# Patient Record
Sex: Female | Born: 1935 | Race: White | Hispanic: No | Smoking: Never smoker
Health system: Southern US, Community
[De-identification: ages and names within clinical notes are randomized; demographics above are authoritative.]

## PROBLEM LIST (undated history)

## (undated) DIAGNOSIS — I10 Essential (primary) hypertension: Secondary | ICD-10-CM

## (undated) DIAGNOSIS — M25552 Pain in left hip: Secondary | ICD-10-CM

## (undated) DIAGNOSIS — M1990 Unspecified osteoarthritis, unspecified site: Secondary | ICD-10-CM

## (undated) DIAGNOSIS — D649 Anemia, unspecified: Secondary | ICD-10-CM

## (undated) DIAGNOSIS — I495 Sick sinus syndrome: Secondary | ICD-10-CM

## (undated) DIAGNOSIS — I4891 Unspecified atrial fibrillation: Secondary | ICD-10-CM

## (undated) DIAGNOSIS — J449 Chronic obstructive pulmonary disease, unspecified: Secondary | ICD-10-CM

## (undated) DIAGNOSIS — I499 Cardiac arrhythmia, unspecified: Secondary | ICD-10-CM

## (undated) DIAGNOSIS — Z993 Dependence on wheelchair: Secondary | ICD-10-CM

## (undated) HISTORY — DX: Chronic obstructive pulmonary disease, unspecified: J44.9

## (undated) HISTORY — PX: BACK SURGERY: SHX140

## (undated) HISTORY — DX: Unspecified atrial fibrillation: I48.91

## (undated) HISTORY — PX: KNEE ARTHROPLASTY: SHX992

## (undated) HISTORY — DX: Unspecified osteoarthritis, unspecified site: M19.90

## (undated) HISTORY — DX: Sick sinus syndrome: I49.5

## (undated) HISTORY — PX: EYE SURGERY: SHX253

## (undated) HISTORY — DX: Essential (primary) hypertension: I10

## (undated) HISTORY — PX: OTHER SURGICAL HISTORY: SHX169

---

## 1999-05-22 ENCOUNTER — Encounter: Payer: Self-pay | Admitting: Orthopedic Surgery

## 1999-05-24 ENCOUNTER — Ambulatory Visit (HOSPITAL_COMMUNITY): Admission: RE | Admit: 1999-05-24 | Discharge: 1999-05-24 | Payer: Self-pay | Admitting: Orthopedic Surgery

## 1999-09-26 ENCOUNTER — Encounter: Payer: Self-pay | Admitting: Orthopedic Surgery

## 1999-09-28 ENCOUNTER — Inpatient Hospital Stay (HOSPITAL_COMMUNITY): Admission: RE | Admit: 1999-09-28 | Discharge: 1999-10-03 | Payer: Self-pay | Admitting: Orthopedic Surgery

## 1999-10-03 ENCOUNTER — Inpatient Hospital Stay (HOSPITAL_COMMUNITY): Admission: RE | Admit: 1999-10-03 | Discharge: 1999-10-07 | Payer: Self-pay | Admitting: *Deleted

## 2000-04-30 ENCOUNTER — Encounter: Payer: Self-pay | Admitting: Orthopedic Surgery

## 2000-04-30 ENCOUNTER — Ambulatory Visit (HOSPITAL_COMMUNITY): Admission: RE | Admit: 2000-04-30 | Discharge: 2000-04-30 | Payer: Self-pay | Admitting: Orthopedic Surgery

## 2001-02-26 ENCOUNTER — Ambulatory Visit (HOSPITAL_COMMUNITY): Admission: RE | Admit: 2001-02-26 | Discharge: 2001-02-26 | Payer: Self-pay | Admitting: Pulmonary Disease

## 2001-09-23 ENCOUNTER — Other Ambulatory Visit: Admission: RE | Admit: 2001-09-23 | Discharge: 2001-09-23 | Payer: Self-pay | Admitting: Obstetrics and Gynecology

## 2004-07-19 ENCOUNTER — Inpatient Hospital Stay (HOSPITAL_COMMUNITY): Admission: EM | Admit: 2004-07-19 | Discharge: 2004-07-20 | Payer: Self-pay | Admitting: Emergency Medicine

## 2004-07-19 ENCOUNTER — Ambulatory Visit: Payer: Self-pay | Admitting: Sports Medicine

## 2004-09-11 ENCOUNTER — Ambulatory Visit: Payer: Self-pay | Admitting: Cardiology

## 2004-09-21 ENCOUNTER — Ambulatory Visit: Payer: Self-pay | Admitting: Cardiology

## 2004-09-29 ENCOUNTER — Ambulatory Visit: Payer: Self-pay | Admitting: Cardiology

## 2004-10-06 ENCOUNTER — Ambulatory Visit: Payer: Self-pay | Admitting: Internal Medicine

## 2004-10-13 ENCOUNTER — Ambulatory Visit (HOSPITAL_COMMUNITY): Admission: RE | Admit: 2004-10-13 | Discharge: 2004-10-13 | Payer: Self-pay | Admitting: Cardiology

## 2004-10-16 ENCOUNTER — Ambulatory Visit: Payer: Self-pay | Admitting: Cardiology

## 2004-10-23 ENCOUNTER — Ambulatory Visit: Payer: Self-pay | Admitting: Cardiology

## 2004-11-02 ENCOUNTER — Ambulatory Visit: Payer: Self-pay | Admitting: Cardiology

## 2004-11-07 ENCOUNTER — Ambulatory Visit: Payer: Self-pay | Admitting: Cardiology

## 2004-11-16 ENCOUNTER — Ambulatory Visit: Payer: Self-pay | Admitting: Internal Medicine

## 2004-11-22 ENCOUNTER — Ambulatory Visit: Payer: Self-pay | Admitting: *Deleted

## 2004-11-27 ENCOUNTER — Ambulatory Visit: Payer: Self-pay | Admitting: Cardiology

## 2004-12-06 ENCOUNTER — Ambulatory Visit: Payer: Self-pay

## 2004-12-15 ENCOUNTER — Ambulatory Visit: Payer: Self-pay | Admitting: Cardiovascular Disease

## 2004-12-29 ENCOUNTER — Ambulatory Visit: Payer: Self-pay | Admitting: Internal Medicine

## 2005-01-03 ENCOUNTER — Ambulatory Visit: Payer: Self-pay | Admitting: Cardiology

## 2005-01-09 ENCOUNTER — Ambulatory Visit: Payer: Self-pay | Admitting: Cardiology

## 2005-01-09 ENCOUNTER — Ambulatory Visit: Payer: Self-pay | Admitting: Internal Medicine

## 2005-01-09 ENCOUNTER — Inpatient Hospital Stay (HOSPITAL_COMMUNITY): Admission: AD | Admit: 2005-01-09 | Discharge: 2005-01-12 | Payer: Self-pay | Admitting: Cardiology

## 2005-01-11 ENCOUNTER — Encounter: Payer: Self-pay | Admitting: Internal Medicine

## 2005-01-16 ENCOUNTER — Ambulatory Visit: Payer: Self-pay | Admitting: Cardiovascular Disease

## 2005-01-16 ENCOUNTER — Inpatient Hospital Stay (HOSPITAL_COMMUNITY): Admission: AD | Admit: 2005-01-16 | Discharge: 2005-01-21 | Payer: Self-pay | Admitting: Cardiology

## 2005-01-25 ENCOUNTER — Ambulatory Visit: Payer: Self-pay | Admitting: Cardiology

## 2005-01-29 ENCOUNTER — Ambulatory Visit (HOSPITAL_COMMUNITY): Admission: RE | Admit: 2005-01-29 | Discharge: 2005-01-29 | Payer: Self-pay | Admitting: Cardiology

## 2005-02-02 ENCOUNTER — Ambulatory Visit: Payer: Self-pay | Admitting: Cardiology

## 2005-02-14 ENCOUNTER — Ambulatory Visit: Payer: Self-pay | Admitting: Cardiology

## 2005-02-28 ENCOUNTER — Ambulatory Visit: Payer: Self-pay | Admitting: Internal Medicine

## 2005-03-17 ENCOUNTER — Emergency Department (HOSPITAL_COMMUNITY): Admission: EM | Admit: 2005-03-17 | Discharge: 2005-03-17 | Payer: Self-pay | Admitting: Emergency Medicine

## 2005-03-21 ENCOUNTER — Ambulatory Visit: Payer: Self-pay | Admitting: Cardiology

## 2005-03-27 ENCOUNTER — Encounter: Admission: RE | Admit: 2005-03-27 | Discharge: 2005-03-27 | Payer: Self-pay | Admitting: Orthopedic Surgery

## 2005-04-18 ENCOUNTER — Ambulatory Visit: Payer: Self-pay | Admitting: Cardiology

## 2005-04-25 ENCOUNTER — Ambulatory Visit: Payer: Self-pay | Admitting: *Deleted

## 2005-04-26 ENCOUNTER — Encounter: Admission: RE | Admit: 2005-04-26 | Discharge: 2005-04-26 | Payer: Self-pay | Admitting: Orthopedic Surgery

## 2005-05-01 ENCOUNTER — Ambulatory Visit: Payer: Self-pay | Admitting: Cardiology

## 2005-05-08 ENCOUNTER — Ambulatory Visit: Payer: Self-pay | Admitting: Cardiology

## 2005-05-21 ENCOUNTER — Ambulatory Visit: Payer: Self-pay | Admitting: Cardiology

## 2005-05-31 ENCOUNTER — Ambulatory Visit: Payer: Self-pay | Admitting: Physical Medicine & Rehabilitation

## 2005-05-31 ENCOUNTER — Inpatient Hospital Stay (HOSPITAL_COMMUNITY): Admission: RE | Admit: 2005-05-31 | Discharge: 2005-06-06 | Payer: Self-pay | Admitting: Orthopedic Surgery

## 2005-06-26 ENCOUNTER — Ambulatory Visit: Payer: Self-pay | Admitting: Cardiology

## 2005-07-02 ENCOUNTER — Ambulatory Visit: Payer: Self-pay | Admitting: Cardiology

## 2005-07-10 ENCOUNTER — Ambulatory Visit: Payer: Self-pay | Admitting: Cardiovascular Disease

## 2005-07-20 ENCOUNTER — Ambulatory Visit: Payer: Self-pay | Admitting: Cardiology

## 2005-08-03 ENCOUNTER — Ambulatory Visit: Payer: Self-pay | Admitting: Cardiology

## 2005-08-31 ENCOUNTER — Ambulatory Visit: Payer: Self-pay | Admitting: Cardiovascular Disease

## 2005-09-14 ENCOUNTER — Ambulatory Visit: Payer: Self-pay | Admitting: Cardiology

## 2005-09-27 ENCOUNTER — Ambulatory Visit: Payer: Self-pay | Admitting: Cardiology

## 2005-10-01 ENCOUNTER — Ambulatory Visit: Payer: Self-pay | Admitting: Cardiology

## 2005-10-05 ENCOUNTER — Ambulatory Visit: Payer: Self-pay | Admitting: Internal Medicine

## 2005-10-09 ENCOUNTER — Ambulatory Visit (HOSPITAL_COMMUNITY): Admission: RE | Admit: 2005-10-09 | Discharge: 2005-10-09 | Payer: Self-pay | Admitting: Pulmonary Disease

## 2005-10-15 ENCOUNTER — Ambulatory Visit: Payer: Self-pay | Admitting: Cardiology

## 2005-10-17 ENCOUNTER — Observation Stay (HOSPITAL_COMMUNITY): Admission: RE | Admit: 2005-10-17 | Discharge: 2005-10-20 | Payer: Self-pay | Admitting: General Surgery

## 2005-10-19 ENCOUNTER — Encounter (INDEPENDENT_AMBULATORY_CARE_PROVIDER_SITE_OTHER): Payer: Self-pay | Admitting: General Surgery

## 2005-10-26 ENCOUNTER — Ambulatory Visit: Payer: Self-pay | Admitting: Cardiology

## 2005-11-09 ENCOUNTER — Ambulatory Visit: Payer: Self-pay | Admitting: Cardiology

## 2005-11-23 ENCOUNTER — Ambulatory Visit: Payer: Self-pay | Admitting: Internal Medicine

## 2005-12-11 ENCOUNTER — Ambulatory Visit (HOSPITAL_COMMUNITY): Admission: RE | Admit: 2005-12-11 | Discharge: 2005-12-11 | Payer: Self-pay | Admitting: Pulmonary Disease

## 2005-12-20 ENCOUNTER — Ambulatory Visit: Payer: Self-pay | Admitting: *Deleted

## 2005-12-27 ENCOUNTER — Ambulatory Visit: Payer: Self-pay | Admitting: Cardiology

## 2005-12-28 ENCOUNTER — Encounter (HOSPITAL_COMMUNITY): Admission: RE | Admit: 2005-12-28 | Discharge: 2006-01-27 | Payer: Self-pay | Admitting: Pulmonary Disease

## 2005-12-29 ENCOUNTER — Emergency Department (HOSPITAL_COMMUNITY): Admission: EM | Admit: 2005-12-29 | Discharge: 2005-12-29 | Payer: Self-pay | Admitting: Emergency Medicine

## 2006-01-10 ENCOUNTER — Ambulatory Visit: Payer: Self-pay | Admitting: Cardiology

## 2006-01-14 ENCOUNTER — Ambulatory Visit: Payer: Self-pay | Admitting: Cardiology

## 2006-01-14 ENCOUNTER — Ambulatory Visit: Payer: Self-pay | Admitting: Physical Medicine & Rehabilitation

## 2006-01-14 ENCOUNTER — Inpatient Hospital Stay (HOSPITAL_COMMUNITY): Admission: EM | Admit: 2006-01-14 | Discharge: 2006-01-25 | Payer: Self-pay | Admitting: Gastroenterology

## 2006-01-14 ENCOUNTER — Ambulatory Visit: Payer: Self-pay | Admitting: Gastroenterology

## 2006-01-24 ENCOUNTER — Ambulatory Visit: Payer: Self-pay | Admitting: Gastroenterology

## 2006-02-13 ENCOUNTER — Ambulatory Visit: Payer: Self-pay | Admitting: Cardiology

## 2006-02-26 ENCOUNTER — Ambulatory Visit: Payer: Self-pay | Admitting: Gastroenterology

## 2006-03-19 ENCOUNTER — Ambulatory Visit: Payer: Self-pay | Admitting: Cardiology

## 2006-03-28 ENCOUNTER — Ambulatory Visit: Payer: Self-pay | Admitting: Cardiology

## 2006-03-29 ENCOUNTER — Ambulatory Visit: Payer: Self-pay | Admitting: Internal Medicine

## 2006-03-29 ENCOUNTER — Ambulatory Visit (HOSPITAL_COMMUNITY): Admission: RE | Admit: 2006-03-29 | Discharge: 2006-03-30 | Payer: Self-pay | Admitting: Internal Medicine

## 2006-04-03 ENCOUNTER — Ambulatory Visit: Payer: Self-pay | Admitting: Cardiology

## 2006-04-12 ENCOUNTER — Ambulatory Visit: Payer: Self-pay | Admitting: Cardiology

## 2006-04-17 ENCOUNTER — Ambulatory Visit: Payer: Self-pay

## 2006-04-29 ENCOUNTER — Ambulatory Visit: Payer: Self-pay | Admitting: Cardiology

## 2006-04-30 ENCOUNTER — Ambulatory Visit: Payer: Self-pay | Admitting: Cardiology

## 2006-05-20 ENCOUNTER — Ambulatory Visit: Payer: Self-pay | Admitting: Cardiology

## 2006-05-20 ENCOUNTER — Ambulatory Visit: Payer: Self-pay | Admitting: Internal Medicine

## 2006-06-10 ENCOUNTER — Ambulatory Visit: Payer: Self-pay | Admitting: Cardiovascular Disease

## 2006-06-21 ENCOUNTER — Ambulatory Visit: Payer: Self-pay | Admitting: Cardiology

## 2006-06-25 ENCOUNTER — Ambulatory Visit: Payer: Self-pay | Admitting: Internal Medicine

## 2006-06-25 ENCOUNTER — Ambulatory Visit: Payer: Self-pay | Admitting: Cardiology

## 2006-06-28 ENCOUNTER — Ambulatory Visit: Payer: Self-pay | Admitting: Internal Medicine

## 2006-07-09 ENCOUNTER — Ambulatory Visit: Payer: Self-pay | Admitting: *Deleted

## 2006-07-23 ENCOUNTER — Ambulatory Visit: Payer: Self-pay | Admitting: Cardiovascular Disease

## 2006-08-13 ENCOUNTER — Ambulatory Visit: Payer: Self-pay | Admitting: *Deleted

## 2006-09-03 ENCOUNTER — Ambulatory Visit: Payer: Self-pay | Admitting: Internal Medicine

## 2006-09-17 ENCOUNTER — Ambulatory Visit: Payer: Self-pay | Admitting: *Deleted

## 2006-09-17 ENCOUNTER — Ambulatory Visit: Payer: Self-pay | Admitting: Cardiology

## 2006-10-07 ENCOUNTER — Ambulatory Visit: Payer: Self-pay | Admitting: Cardiology

## 2006-10-15 ENCOUNTER — Ambulatory Visit: Payer: Self-pay | Admitting: Cardiology

## 2006-10-29 ENCOUNTER — Ambulatory Visit: Payer: Self-pay | Admitting: Cardiology

## 2006-11-26 ENCOUNTER — Ambulatory Visit: Payer: Self-pay | Admitting: Cardiology

## 2006-12-24 ENCOUNTER — Ambulatory Visit: Payer: Self-pay | Admitting: Cardiology

## 2006-12-30 ENCOUNTER — Ambulatory Visit: Payer: Self-pay | Admitting: Internal Medicine

## 2007-01-06 ENCOUNTER — Ambulatory Visit: Payer: Self-pay | Admitting: Cardiology

## 2007-01-27 ENCOUNTER — Ambulatory Visit: Payer: Self-pay | Admitting: Internal Medicine

## 2007-02-24 ENCOUNTER — Ambulatory Visit: Payer: Self-pay | Admitting: Cardiology

## 2007-03-05 ENCOUNTER — Encounter (INDEPENDENT_AMBULATORY_CARE_PROVIDER_SITE_OTHER): Payer: Self-pay

## 2007-03-05 LAB — CONVERTED CEMR LAB
Albumin: 3.5 g/dL
Alkaline Phosphatase: 92 units/L
CO2: 21 meq/L
Calcium: 8.1 mg/dL
Chloride: 108 meq/L
Creatinine, Ser: 0.68 mg/dL
HDL: 50 mg/dL
LDL Cholesterol: 90 mg/dL
MCV: 100.8 fL
Potassium: 3.7 meq/L
Sodium: 143 meq/L
Total Protein: 6.6 g/dL

## 2007-03-24 ENCOUNTER — Ambulatory Visit: Payer: Self-pay | Admitting: Internal Medicine

## 2007-04-08 ENCOUNTER — Ambulatory Visit: Payer: Self-pay | Admitting: Cardiology

## 2007-04-22 ENCOUNTER — Ambulatory Visit: Payer: Self-pay | Admitting: Cardiology

## 2007-05-08 ENCOUNTER — Ambulatory Visit: Payer: Self-pay | Admitting: Cardiology

## 2007-05-22 ENCOUNTER — Ambulatory Visit: Payer: Self-pay | Admitting: Cardiovascular Disease

## 2007-05-29 ENCOUNTER — Ambulatory Visit: Payer: Self-pay | Admitting: Cardiology

## 2007-06-26 ENCOUNTER — Ambulatory Visit: Payer: Self-pay | Admitting: Cardiovascular Disease

## 2007-07-25 ENCOUNTER — Ambulatory Visit: Payer: Self-pay | Admitting: Cardiology

## 2007-10-01 ENCOUNTER — Ambulatory Visit: Payer: Self-pay | Admitting: Cardiology

## 2007-10-16 ENCOUNTER — Ambulatory Visit: Payer: Self-pay | Admitting: Cardiovascular Disease

## 2007-10-31 ENCOUNTER — Ambulatory Visit: Payer: Self-pay | Admitting: Cardiology

## 2007-11-19 ENCOUNTER — Ambulatory Visit: Payer: Self-pay | Admitting: Cardiovascular Disease

## 2007-12-12 ENCOUNTER — Ambulatory Visit: Payer: Self-pay | Admitting: Cardiology

## 2008-01-02 ENCOUNTER — Ambulatory Visit: Payer: Self-pay | Admitting: Cardiology

## 2008-01-29 ENCOUNTER — Ambulatory Visit: Payer: Self-pay | Admitting: Cardiology

## 2008-02-26 ENCOUNTER — Ambulatory Visit: Payer: Self-pay | Admitting: Cardiology

## 2008-03-25 ENCOUNTER — Ambulatory Visit: Payer: Self-pay | Admitting: Internal Medicine

## 2008-04-13 ENCOUNTER — Ambulatory Visit: Payer: Self-pay | Admitting: Internal Medicine

## 2008-04-19 ENCOUNTER — Ambulatory Visit: Payer: Self-pay | Admitting: Cardiology

## 2008-05-03 ENCOUNTER — Ambulatory Visit: Payer: Self-pay | Admitting: Internal Medicine

## 2008-05-17 ENCOUNTER — Ambulatory Visit: Payer: Self-pay | Admitting: Internal Medicine

## 2008-05-31 ENCOUNTER — Ambulatory Visit: Payer: Self-pay | Admitting: Cardiovascular Disease

## 2008-06-21 ENCOUNTER — Ambulatory Visit: Payer: Self-pay | Admitting: Cardiology

## 2008-07-12 ENCOUNTER — Ambulatory Visit: Payer: Self-pay | Admitting: Cardiology

## 2008-07-16 ENCOUNTER — Ambulatory Visit: Payer: Self-pay | Admitting: Cardiology

## 2008-08-09 ENCOUNTER — Ambulatory Visit: Payer: Self-pay | Admitting: Cardiovascular Disease

## 2008-08-30 ENCOUNTER — Ambulatory Visit: Payer: Self-pay | Admitting: Internal Medicine

## 2008-09-16 ENCOUNTER — Ambulatory Visit: Payer: Self-pay | Admitting: Cardiology

## 2008-09-30 ENCOUNTER — Ambulatory Visit: Payer: Self-pay | Admitting: Cardiology

## 2008-10-28 ENCOUNTER — Ambulatory Visit: Payer: Self-pay | Admitting: Cardiology

## 2008-11-29 ENCOUNTER — Ambulatory Visit: Payer: Self-pay | Admitting: Cardiology

## 2008-12-27 ENCOUNTER — Ambulatory Visit: Payer: Self-pay | Admitting: Internal Medicine

## 2009-01-24 ENCOUNTER — Ambulatory Visit: Payer: Self-pay | Admitting: Cardiology

## 2009-02-07 ENCOUNTER — Ambulatory Visit: Payer: Self-pay | Admitting: Cardiology

## 2009-02-18 ENCOUNTER — Encounter (INDEPENDENT_AMBULATORY_CARE_PROVIDER_SITE_OTHER): Payer: Self-pay | Admitting: Radiology

## 2009-03-07 ENCOUNTER — Ambulatory Visit: Payer: Self-pay | Admitting: Cardiology

## 2009-03-22 ENCOUNTER — Ambulatory Visit (HOSPITAL_COMMUNITY): Admission: RE | Admit: 2009-03-22 | Discharge: 2009-03-22 | Payer: Self-pay | Admitting: Ophthalmology

## 2009-03-28 ENCOUNTER — Ambulatory Visit: Payer: Self-pay | Admitting: Cardiology

## 2009-04-12 ENCOUNTER — Encounter: Payer: Self-pay | Admitting: *Deleted

## 2009-04-12 DIAGNOSIS — M129 Arthropathy, unspecified: Secondary | ICD-10-CM | POA: Insufficient documentation

## 2009-04-12 DIAGNOSIS — I4891 Unspecified atrial fibrillation: Secondary | ICD-10-CM | POA: Insufficient documentation

## 2009-04-12 DIAGNOSIS — J449 Chronic obstructive pulmonary disease, unspecified: Secondary | ICD-10-CM | POA: Insufficient documentation

## 2009-04-12 DIAGNOSIS — I10 Essential (primary) hypertension: Secondary | ICD-10-CM

## 2009-04-13 ENCOUNTER — Ambulatory Visit: Payer: Self-pay | Admitting: Internal Medicine

## 2009-04-13 ENCOUNTER — Encounter: Payer: Self-pay | Admitting: Internal Medicine

## 2009-04-13 DIAGNOSIS — Z950 Presence of cardiac pacemaker: Secondary | ICD-10-CM | POA: Insufficient documentation

## 2009-04-29 ENCOUNTER — Encounter: Payer: Self-pay | Admitting: Cardiology

## 2009-05-05 ENCOUNTER — Ambulatory Visit: Payer: Self-pay | Admitting: Cardiology

## 2009-05-18 ENCOUNTER — Encounter: Payer: Self-pay | Admitting: *Deleted

## 2009-06-06 ENCOUNTER — Ambulatory Visit: Payer: Self-pay | Admitting: Cardiology

## 2009-06-27 ENCOUNTER — Encounter: Payer: Self-pay | Admitting: *Deleted

## 2009-07-11 ENCOUNTER — Ambulatory Visit: Payer: Self-pay | Admitting: Cardiology

## 2009-07-11 LAB — CONVERTED CEMR LAB: POC INR: 3

## 2009-08-08 ENCOUNTER — Ambulatory Visit: Payer: Self-pay | Admitting: Cardiology

## 2009-08-29 ENCOUNTER — Ambulatory Visit: Payer: Self-pay | Admitting: Cardiology

## 2009-08-29 LAB — CONVERTED CEMR LAB: POC INR: 2.8

## 2009-09-22 ENCOUNTER — Ambulatory Visit: Payer: Self-pay | Admitting: Cardiology

## 2009-09-22 LAB — CONVERTED CEMR LAB: POC INR: 2.6

## 2009-10-10 ENCOUNTER — Ambulatory Visit: Admission: RE | Admit: 2009-10-10 | Discharge: 2009-10-10 | Payer: Self-pay | Admitting: Pulmonary Disease

## 2009-10-20 ENCOUNTER — Ambulatory Visit: Payer: Self-pay | Admitting: Cardiology

## 2009-10-31 ENCOUNTER — Ambulatory Visit: Payer: Self-pay | Admitting: Cardiology

## 2009-10-31 ENCOUNTER — Encounter: Payer: Self-pay | Admitting: Internal Medicine

## 2009-11-16 ENCOUNTER — Encounter (INDEPENDENT_AMBULATORY_CARE_PROVIDER_SITE_OTHER): Payer: Self-pay

## 2009-11-16 ENCOUNTER — Ambulatory Visit: Payer: Self-pay | Admitting: Cardiology

## 2009-11-16 LAB — CONVERTED CEMR LAB: POC INR: 2.8

## 2009-12-15 ENCOUNTER — Ambulatory Visit: Payer: Self-pay | Admitting: Cardiology

## 2009-12-15 LAB — CONVERTED CEMR LAB: POC INR: 2.7

## 2010-01-12 ENCOUNTER — Ambulatory Visit: Payer: Self-pay | Admitting: Cardiology

## 2010-01-12 LAB — CONVERTED CEMR LAB: POC INR: 2.8

## 2010-02-09 ENCOUNTER — Ambulatory Visit: Payer: Self-pay | Admitting: Cardiology

## 2010-03-13 ENCOUNTER — Ambulatory Visit: Payer: Self-pay | Admitting: Cardiology

## 2010-03-27 ENCOUNTER — Ambulatory Visit: Payer: Self-pay | Admitting: Cardiology

## 2010-03-31 ENCOUNTER — Ambulatory Visit: Admission: RE | Admit: 2010-03-31 | Discharge: 2010-03-31 | Payer: Self-pay | Admitting: Gynecology

## 2010-04-17 ENCOUNTER — Encounter: Payer: Self-pay | Admitting: Cardiology

## 2010-04-17 ENCOUNTER — Telehealth (INDEPENDENT_AMBULATORY_CARE_PROVIDER_SITE_OTHER): Payer: Self-pay

## 2010-04-18 ENCOUNTER — Encounter: Payer: Self-pay | Admitting: Cardiology

## 2010-04-19 ENCOUNTER — Ambulatory Visit: Payer: Self-pay | Admitting: Cardiology

## 2010-04-19 ENCOUNTER — Telehealth: Payer: Self-pay | Admitting: Cardiology

## 2010-04-19 LAB — CONVERTED CEMR LAB: POC INR: 2.5

## 2010-04-20 ENCOUNTER — Encounter (INDEPENDENT_AMBULATORY_CARE_PROVIDER_SITE_OTHER): Payer: Self-pay | Admitting: *Deleted

## 2010-04-25 ENCOUNTER — Ambulatory Visit (HOSPITAL_COMMUNITY): Admission: RE | Admit: 2010-04-25 | Discharge: 2010-04-27 | Payer: Self-pay | Admitting: Gynecologic Oncology

## 2010-04-25 ENCOUNTER — Encounter: Payer: Self-pay | Admitting: Obstetrics and Gynecology

## 2010-05-01 ENCOUNTER — Ambulatory Visit: Payer: Self-pay | Admitting: Cardiovascular Disease

## 2010-05-10 ENCOUNTER — Ambulatory Visit: Payer: Self-pay | Admitting: Cardiology

## 2010-05-10 ENCOUNTER — Ambulatory Visit: Payer: Self-pay | Admitting: Internal Medicine

## 2010-05-10 LAB — CONVERTED CEMR LAB: POC INR: 2.4

## 2010-05-31 ENCOUNTER — Ambulatory Visit: Payer: Self-pay | Admitting: Cardiology

## 2010-05-31 LAB — CONVERTED CEMR LAB: POC INR: 2.5

## 2010-07-03 ENCOUNTER — Ambulatory Visit: Payer: Self-pay | Admitting: Cardiology

## 2010-07-03 LAB — CONVERTED CEMR LAB: POC INR: 3

## 2010-07-31 ENCOUNTER — Ambulatory Visit: Payer: Self-pay | Admitting: Cardiology

## 2010-08-28 ENCOUNTER — Ambulatory Visit: Payer: Self-pay | Admitting: Cardiology

## 2010-08-28 LAB — CONVERTED CEMR LAB: POC INR: 3.2

## 2010-10-02 ENCOUNTER — Ambulatory Visit: Payer: Self-pay | Admitting: Cardiology

## 2010-10-02 LAB — CONVERTED CEMR LAB: POC INR: 2.2

## 2010-10-27 ENCOUNTER — Encounter: Payer: Self-pay | Admitting: Internal Medicine

## 2010-10-27 ENCOUNTER — Ambulatory Visit: Payer: Self-pay | Admitting: Cardiology

## 2010-10-27 ENCOUNTER — Ambulatory Visit: Payer: Self-pay

## 2010-11-27 ENCOUNTER — Ambulatory Visit
Admission: RE | Admit: 2010-11-27 | Discharge: 2010-11-27 | Payer: Self-pay | Source: Home / Self Care | Attending: Cardiology | Admitting: Cardiology

## 2010-12-03 ENCOUNTER — Encounter: Payer: Self-pay | Admitting: Pulmonary Disease

## 2010-12-14 NOTE — Medication Information (Signed)
Summary: ccr-lr  Anticoagulant Therapy  Managed by: Vashti Hey, RN Referring MD: Valera Castle MD PCP: Dr.Hawkins Supervising MD: Daleen Squibb MD, Maisie Fus Indication 1: Atrial Fibrillation (ICD-427.31) Lab Used: [facility] [place] Site: [place] INR POC 2.5 INR RANGE 2 - 3  Dietary changes: no    Health status changes: no    Bleeding/hemorrhagic complications: no    Recent/future hospitalizations: no    Any changes in medication regimen? no    Recent/future dental: no  Any missed doses?: no       Is patient compliant with meds? yes       Allergies: No Known Drug Allergies  Anticoagulation Management History:      Positive risk factors for bleeding include an age of 75 years or older.  The bleeding index is 'intermediate risk'.  Positive CHADS2 values include History of HTN and Age > 50 years old.  The start date was 05/05/2009.  Anticoagulation responsible provider: Daleen Squibb MD, Maisie Fus.  INR POC: 2.5.  Exp: 06/2011.    Anticoagulation Management Assessment/Plan:      The patient's current anticoagulation dose is Warfarin sodium 5 mg tabs: Take as directed by [facility]in Clinic.  The target INR is 2 - 3.  The next INR is due 07/03/2010.  Anticoagulation instructions were given to patient.  Results were reviewed/authorized by Vashti Hey, RN.  She was notified by Vashti Hey RN.         Prior Anticoagulation Instructions: INR 2.4 Continue coumadin 5mg  once daily except 2.5mg  on Mondays, Wednesdays and Fridays  Current Anticoagulation Instructions: INR 2.5 Continue coumadin 5mg  once daily except 2.5mg  on Mondays, Wednesdays and Fridays

## 2010-12-14 NOTE — Cardiovascular Report (Signed)
Summary: Office Visit   Office Visit   Imported By: Roderic Ovens 11/24/2009 16:10:53  _____________________________________________________________________  External Attachment:    Type:   Image     Comment:   External Document

## 2010-12-14 NOTE — Medication Information (Signed)
Summary: CCR  Anticoagulant Therapy  Managed by: Weston Brass, PharmD Referring MD: Valera Castle MD PCP: Dr.Hawkins Supervising MD: Eden Emms MD, Theron Arista Indication 1: Atrial Fibrillation (ICD-427.31) Lab Used: [facility] [place] Site: [place] INR POC 1.5 INR RANGE 2 - 3  Dietary changes: no    Health status changes: yes       Details: having some SOB.  Slight edema in LLE>RLE  Bleeding/hemorrhagic complications: no    Recent/future hospitalizations: yes       Details: had surgery last week  Any changes in medication regimen? no    Recent/future dental: no  Any missed doses?: yes     Details: off Coumadin x 5 days for surgery.  Restarted on 6/15.  Took 5mg  x 1 day then back to normal dose.   Is patient compliant with meds? yes       Allergies: No Known Drug Allergies  Anticoagulation Management History:      The patient is taking warfarin and comes in today for a routine follow up visit.  Positive risk factors for bleeding include an age of 7 years or older.  The bleeding index is 'intermediate risk'.  Positive CHADS2 values include History of HTN and Age > 50 years old.  The start date was 05/05/2009.  Anticoagulation responsible provider: Eden Emms MD, Theron Arista.  INR POC: 1.5.  Cuvette Lot#: [id number].  Exp: 06/2011.    Anticoagulation Management Assessment/Plan:      The patient's current anticoagulation dose is Warfarin sodium 5 mg tabs: Take as directed by Coumadin Clinic.  The target INR is 2 - 3.  The next INR is due 05/10/2010.  Anticoagulation instructions were given to patient.  Results were reviewed/authorized by Weston Brass, PharmD.  She was notified by Weston Brass PharmD.         Prior Anticoagulation Instructions: INR 2.5 Continue coumadin 5mg  once daily except 2.5mg  on Mondays, Wednesdays and Fridays Scheduled for hysterectomy on 04/25/10.  Last dose of coumadin on 04/21/10.  Restart coumadin at above dose ASAP after surgery.  Current  Anticoagulation Instructions: INR 1.5  Take 1 1/2 tablets today then resume same dose of 1 tablet every day except 1/2 tablet on Monday, Wednesday and Friday.

## 2010-12-14 NOTE — Progress Notes (Signed)
Summary: NEEDS TO BE OFF COUMADIN FOR 5 DAYS INSTEAD OF THREE   Phone Note From Other Clinic Call back at [phone] FAX [phone]   Caller: Karie Fetch RN WITH [facility] Request: Talk with Provider Summary of Call: S: PT WAS GIVEN CLEARANCE TO STOP COUMADIN FOR 3 DAYS FOR D&C BUT IS HAVING TAH, BSO AND POSSIBLE LIPNODE BIOPSY SO SHE WILL NEED TO BE OFF FOR 5 DAYS. WHEN DONE SHE WILL BE GIVEN A SHOT OF LOVANOX BEFORE PROCEDURE.  NEEDS TO KNOW BY 04/20/10 Initial call taken by: Faythe Ghee,  April 19, 2010 4:50 PM  Follow-up for Phone Call        Please advise. Follow-up by: Larita Fife Via LPN,  April 19, 6294 4:54 PM  Additional Follow-up for Phone Call Additional follow up Details #1::        OK to stop Coumadin for 5 days.  Additional Follow-up by: Gaylord Shih, MD, [facility],  April 20, 2010 3:34 PM     Appended Document: NEEDS TO BE OFF COUMADIN FOR 5 DAYS INSTEAD OF THREE clearance letter faxed to Dr. Cleda Mccreedy MD

## 2010-12-14 NOTE — Medication Information (Signed)
Summary: ccr-lr  Anticoagulant Therapy  Managed by: Vashti Hey, RN Referring MD: Valera Castle MD PCP: Dr.Hawkins Supervising MD: Dietrich Pates MD, Molly Maduro Indication 1: Atrial Fibrillation (ICD-427.31) Lab Used: [facility] [place] Site: [place] INR POC 2.2 INR RANGE 2 - 3  Dietary changes: no    Health status changes: no    Bleeding/hemorrhagic complications: no    Recent/future hospitalizations: no    Any changes in medication regimen? no    Recent/future dental: no  Any missed doses?: no       Is patient compliant with meds? yes       Allergies: No Known Drug Allergies  Anticoagulation Management History:      The patient is taking warfarin and comes in today for a routine follow up visit.  Positive risk factors for bleeding include an age of 43 years or older.  The bleeding index is 'intermediate risk'.  Positive CHADS2 values include History of HTN and Age > 82 years old.  The start date was 05/05/2009.  Anticoagulation responsible provider: Dietrich Pates MD, Molly Maduro.  INR POC: 2.2.  Cuvette Lot#: [id number].  Exp: 06/2011.    Anticoagulation Management Assessment/Plan:      The patient's current anticoagulation dose is Warfarin sodium 5 mg tabs: Take as directed by Coumadin Clinic.  The target INR is 2 - 3.  The next INR is due 10/30/2010.  Anticoagulation instructions were given to patient.  Results were reviewed/authorized by Vashti Hey, RN.  She was notified by Vashti Hey RN.         Prior Anticoagulation Instructions: INR 3.2 Hold coumadin tonight then resume 5mg  once daily except 2.5mg  on Mondays, Wednesdays and Fridays  Current Anticoagulation Instructions: INR 2.2 Continue coumadin 5mg  once daily except 2.5mg  on Mondays, Wednesdays and Fridays

## 2010-12-14 NOTE — Assessment & Plan Note (Signed)
Summary: 6 mth f/u per checkout in 1/11/tg  Medications Added ALLEGRA 180 MG TABS (FEXOFENADINE HCL) take one tablet once a day THERAGRAN-M  TABS (MULTIPLE VITAMINS-MINERALS) take one tablet by mouth once daily      Allergies Added: NKDA  Visit Type:  Follow-up Primary Provider:  Dr.Hawkins  CC:  follow-up visit.  History of Present Illness: [name] returns today for evaluation and management of her chronic atrial fibrillation, anticoagulation, history of bradycardia requiring pacemaker implantation, and hypertension.  She just had her having a hysterectomy for a cancerous polyp. She tolerated the procedure remarkably well with no cardiac complications. She also was able to come off Coumadin without problems.  She still off of a walker. She denies any orthopnea, PND peripheral edema. She's had no chest pain or and no recurrent falls.  Preventive Screening-Counseling & Management  Alcohol-Tobacco     Smoking Status: never  Current Medications (verified): 1)  Warfarin Sodium 5 Mg Tabs (Warfarin Sodium) .... Take As Directed By Coumadin Clinic 2)  Lisinopril 40 Mg Tabs (Lisinopril) .Marland Kitchen.. 1 Tab Once Daily 3)  Metoprolol Tartrate 25 Mg Tabs (Metoprolol Tartrate) .... One By Mouth Two Times A Day 4)  Allegra 180 Mg Tabs (Fexofenadine Hcl) .... Take One Tablet Once A Day 5)  Theragran-M  Tabs (Multiple Vitamins-Minerals) .... Take One Tablet By Mouth Once Daily  Allergies (verified): No Known Drug Allergies  Past History:  Past Medical History: Last updated: 2009-04-19  Current Problems:  COPD (ICD-496) ARTHRITIS (ICD-716.90) HYPERTENSION (ICD-401.9) ATRIAL FIBRILLATION (ICD-427.31)  Past Surgical History: Last updated: 2009/04/19 Back Surgery Hip Arthroplasty-Total Knee Arthroscopy  Family History: Last updated: 04/19/2009 Father: deceased -prostate ca,chf Mother: deceased-cva Siblings: 1 sister deceased-unknown  Social History: Last updated:  04-19-2009 Retired  Tobacco Use - No.   Risk Factors: Smoking Status: never (05/31/2010)  Review of Systems       negative other than history of present illness  Vital Signs:  Patient profile:   77 year old female Height:      60 inches Weight:      193.12 pounds O2 Sat:      94 % Pulse rate:   70 / minute BP sitting:   117 / 78  (left arm)  Vitals Entered By: Youlanda Mighty RN (May 31, 2010 1:24 PM) CC: follow-up visit   Physical Exam  General:  obese.  no acute distress, using walker Head:  normocephalic and atraumatic Eyes:  glasses otherwise unremarkable Neck:  Neck supple, no JVD. No masses, thyromegaly or abnormal cervical nodes. Lungs:  Clear bilaterally to auscultation and percussion. Heart:  PMI nondisplaced, irregular rate and rhythm, soft systolic murmur at the apex. Msk:  decreased ROM.   Pulses:  pulses normal in all 4 extremities Extremities:  trace left pedal edema and trace right pedal edema.   Neurologic:  Alert and oriented x 3. Skin:  Intact without lesions or rashes. Psych:  Normal affect.   PPM Specifications Following MD:  Lewayne Bunting, MD     PPM Vendor:  [employer]     PPM Model Number:  [id number]     PPM Serial Number:  [id number] PPM DOI:  03/29/2006     PPM Implanting MD:  Sherryl Manges, MD  Lead 1    Location: [facility]     DOI: 03/29/2006     Model #: 1642T     Serial #: [id number]     Status: active Lead 2    Location: [facility]  DOI: 03/29/2006     Model #: 1646T     Serial #: [id number]     Status: active  Magnet Response Rate:  BOL 98.6 ERI 86.3  Indications:  SND   Episodes Coumadin:  Yes  Parameters Mode:  DDI     Lower Rate Limit:  60     Paced AV Delay:  250     Impression & Recommendations:  Problem # 1:  ATRIAL FIBRILLATION (ICD-427.31) Assessment Unchanged  Her updated medication list for this problem includes:    Warfarin Sodium 5 Mg Tabs (Warfarin sodium) .Marland Kitchen... Take as directed by coumadin clinic    Metoprolol Tartrate 25 Mg Tabs  (Metoprolol tartrate) ..... One by mouth two times a day  Problem # 2:  HYPERTENSION (ICD-401.9) Assessment: Improved  Her updated medication list for this problem includes:    Lisinopril 40 Mg Tabs (Lisinopril) .Marland Kitchen... 1 tab once daily    Metoprolol Tartrate 25 Mg Tabs (Metoprolol tartrate) ..... One by mouth two times a day  Problem # 3:  COUMADIN THERAPY (ICD-V58.61) Assessment: Unchanged  Patient Instructions: 1)  Your physician recommends that you schedule a follow-up appointment in: 6 months 2)  Your physician recommends that you continue on your current medications as directed. Please refer to the Current Medication list given to you today.

## 2010-12-14 NOTE — Letter (Signed)
Summary: Clearance Letter  [facility]  [address] [place] [address]   Phone: [phone]  Fax: [phone]    April 20, 2010  Re:     [name] Address:   [address]     [place], [place]  [address] DOB:     [birth year] MRN:     [id number]   Dear Dr. Cleda Mccreedy:  [name] has been cleared for an total abdominal hysterectomy with bilateral salpingoopherectomy from a cardiac stand point.  She can hold her coumadin 5 day prior to surgery and restart once she is taking pills by mouth.  Please have her call the office for a inr check 7-10 days after leaving the hospital.  If we can assist in any way during her hospitalization, please let our staff know.           Sincerely,  Dr. Valera Castle, MD, [facility]

## 2010-12-14 NOTE — Medication Information (Signed)
Summary: ccr-lr  Anticoagulant Therapy  Managed by: Vashti Hey, RN Referring MD: Valera Castle MD PCP: Dr.Hawkins Supervising MD: Diona Browner MD, Remi Deter Indication 1: Atrial Fibrillation (ICD-427.31) Lab Used: [facility] [place] Site: [place] INR POC 3.2 INR RANGE 2 - 3  Dietary changes: no    Health status changes: no    Bleeding/hemorrhagic complications: no    Recent/future hospitalizations: no    Any changes in medication regimen? no    Recent/future dental: no  Any missed doses?: no       Is patient compliant with meds? yes       Allergies: No Known Drug Allergies  Anticoagulation Management History:      The patient is taking warfarin and comes in today for a routine follow up visit.  Positive risk factors for bleeding include an age of 75 years or older.  The bleeding index is 'intermediate risk'.  Positive CHADS2 values include History of HTN and Age > 42 years old.  The start date was 05/05/2009.  Anticoagulation responsible Edell Mesenbrink: Diona Browner MD, Remi Deter.  INR POC: 3.2.  Cuvette Lot#: [id number].  Exp: 06/2011.    Anticoagulation Management Assessment/Plan:      The patient's current anticoagulation dose is Warfarin sodium 5 mg tabs: Take as directed by Coumadin Clinic.  The target INR is 2 - 3.  The next INR is due 10/02/2010.  Anticoagulation instructions were given to patient.  Results were reviewed/authorized by Vashti Hey, RN.  She was notified by Vashti Hey RN.         Prior Anticoagulation Instructions: INR 2.7 Continue coumadin 5mg  once daily except 2.5mg  on Mondays, Wednesdays and Fridays  Current Anticoagulation Instructions: INR 3.2 Hold coumadin tonight then resume 5mg  once daily except 2.5mg  on Mondays, Wednesdays and Fridays

## 2010-12-14 NOTE — Progress Notes (Signed)
Summary: Hysterectomy   Phone Note From Other Clinic   Summary of Call: Dr. Emelda Fear from [facility] states that pt. has endometrial cancer and is scheduled for Laparoscopic Hysterectomy on June 14.  He needs to know when you would like pt. to hold coumadin and when to resume taking. Office note from Dr. Emelda Fear scanned into chart. Initial call taken by: Larita Fife Via LPN,  April 17, 2352 1:19 PM  Follow-up for Phone Call        she can hold for 3 days. back on asap. Follow-up by: Gaylord Shih, MD, [facility],  April 17, 2010 2:55 PM

## 2010-12-14 NOTE — Assessment & Plan Note (Signed)
Summary: fu/sn   Visit Type:  Follow-up Primary Provider:  Dr.Hawkins   History of Present Illness: [name] returns today for her history of April for ablation, anticoagulation, hypertension and history remote diastolic heart failure.  She is doing remarkably well and is quite mobile. She denies any dyspnea on exertion, chest pain, orthopnea, PND or peripheral edema. She has had no problems with Coumadin. Her INR today is 2.8. She is very compliant.  Current Medications (verified): 1)  Warfarin Sodium 5 Mg Tabs (Warfarin Sodium) .... Take As Directed By Coumadin Clinic 2)  Lisinopril 40 Mg Tabs (Lisinopril) .Marland Kitchen.. 1 Tab Once Daily 3)  Metoprolol Tartrate 25 Mg Tabs (Metoprolol Tartrate) .... One By Mouth Two Times A Day 4)  Claritin 10 Mg Caps (Loratadine) .... One By Mouth Daily  Allergies (verified): No Known Drug Allergies  Past History:  Past Medical History: Last updated: Apr 27, 2009  Current Problems:  COPD (ICD-496) ARTHRITIS (ICD-716.90) HYPERTENSION (ICD-401.9) ATRIAL FIBRILLATION (ICD-427.31)  Past Surgical History: Last updated: 2009-04-27 Back Surgery Hip Arthroplasty-Total Knee Arthroscopy  Family History: Last updated: 2009/04/27 Father: deceased -prostate ca,chf Mother: deceased-cva Siblings: 1 sister deceased-unknown  Social History: Last updated: Apr 27, 2009 Retired  Tobacco Use - No.   Risk Factors: Smoking Status: never (04/27/09)  Vital Signs:  Patient profile:   77 year old female Weight:      196 pounds Pulse rate:   69 / minute BP sitting:   110 / 58  (right arm)  Vitals Entered By: Dreama Saa, CNA (November 16, 2009 11:19 AM)  Physical Exam  General:  obese.   Head:  normocephalic and atraumatic Eyes:  PERRLA/EOM intact; conjunctiva and lids normal. Mouth:  Teeth, gums and palate normal. Oral mucosa normal. Neck:  Neck supple, no JVD. No masses, thyromegaly or abnormal cervical nodes. Lungs:  Clear bilaterally to  auscultation and percussion. Heart:  regular rate and rhythm, properly paced area no gallop Msk:  decreased ROM.  using a walker Pulses:  pulses normal in all 4 extremities Extremities:  trace left pedal edema and trace right pedal edema. varicose veins, no DVT Neurologic:  Alert and oriented x 3. Skin:  Intact without lesions or rashes. Psych:  Normal affect.   PPM Specifications Following MD:  Lewayne Bunting, MD     PPM Vendor:  [employer] Jude     PPM Model Number:  [id number]     PPM Serial Number:  [id number] PPM DOI:  03/29/2006     PPM Implanting MD:  Sherryl Manges, MD  Lead 1    Location: [facility]     DOI: 03/29/2006     Model #: 1642T     Serial #: [id number]     Status: active Lead 2    Location: [facility]     DOI: 03/29/2006     Model #: 1646T     Serial #: [id number]     Status: active  Magnet Response Rate:  BOL 98.6 ERI 86.3  Indications:  SND   Episodes Coumadin:  Yes  Parameters Mode:  DDI     Lower Rate Limit:  60     Paced AV Delay:  250     Impression & Recommendations:  Problem # 1:  ATRIAL FIBRILLATION (ICD-427.31) Assessment Unchanged  Her updated medication list for this problem includes:    Warfarin Sodium 5 Mg Tabs (Warfarin sodium) .Marland Kitchen... Take as directed by coumadin clinic    Metoprolol Tartrate 25 Mg Tabs (Metoprolol tartrate) ..... One by mouth two times  a day  Problem # 2:  PACEMAKER, PERMANENT (ICD-V45.01) Assessment: Unchanged  Problem # 3:  HYPERTENSION (ICD-401.9) Assessment: Improved  Her updated medication list for this problem includes:    Lisinopril 40 Mg Tabs (Lisinopril) .Marland Kitchen... 1 tab once daily    Metoprolol Tartrate 25 Mg Tabs (Metoprolol tartrate) ..... One by mouth two times a day  Patient Instructions: 1)  Your physician recommends that you schedule a follow-up appointment in: 6 MONTHS

## 2010-12-14 NOTE — Procedures (Signed)
Summary: pacer check per reminder letter/sn      Allergies Added: NKDA  Current Medications (verified): 1)  Warfarin Sodium 5 Mg Tabs (Warfarin Sodium) .... Take As Directed By Coumadin Clinic 2)  Lisinopril 40 Mg Tabs (Lisinopril) .Marland Kitchen.. 1 Tab Once Daily 3)  Metoprolol Tartrate 25 Mg Tabs (Metoprolol Tartrate) .... One By Mouth Two Times A Day 4)  Theragran-M  Tabs (Multiple Vitamins-Minerals) .... Take One Tablet By Mouth Once Daily  Allergies (verified): No Known Drug Allergies   PPM Specifications Following MD:  Lewayne Bunting, MD     PPM Vendor:  [employer] Jude     PPM Model Number:  [id number]     PPM Serial Number:  [id number] PPM DOI:  03/29/2006     PPM Implanting MD:  Sherryl Manges, MD  Lead 1    Location: [facility]     DOI: 03/29/2006     Model #: 1642T     Serial #: [id number]     Status: active Lead 2    Location: [facility]     DOI: 03/29/2006     Model #: 1646T     Serial #: [id number]     Status: active  Magnet Response Rate:  BOL 98.6 ERI 86.3  Indications:  SND   PPM Follow Up Remote Check?  No Battery Voltage:  2.76 V     Battery Est. Longevity:  7.50     Pacer Dependent:  No       PPM Device Measurements Atrium  Amplitude: 0.3 mV, Impedance: 456 ohms,  Right Ventricle  Amplitude: 12 mV, Impedance: 612 ohms, Threshold: 10.375 V at 0.5 msec  Episodes Percent Mode Switch:  100%     Coumadin:  Yes Atrial Pacing:  <1%     Ventricular Pacing:  17%  Parameters Mode:  DDI     Lower Rate Limit:  60     Paced AV Delay:  250     Next Cardiology Appt Due:  04/13/2011 Tech Comments:  No parameter changes.   Device function normal.  A-fib with controlled ventricular rates, + coumadin.  ROV 6 months with Dr. Ladona Ridgel in [place]. Altha Harm, LPN  October 27, 2010 3:10 PM

## 2010-12-14 NOTE — Medication Information (Signed)
Summary: ccr-lr  Anticoagulant Therapy  Managed by: Vashti Hey, RN Referring MD: Valera Castle MD PCP: Dr.Hawkins Supervising MD: Daleen Squibb MD, Maisie Fus Indication 1: Atrial Fibrillation (ICD-427.31) Lab Used: [facility]  Site: [place] INR POC 3.0 INR RANGE 2 - 3  Dietary changes: no    Health status changes: no    Bleeding/hemorrhagic complications: no    Recent/future hospitalizations: no    Any changes in medication regimen? no    Recent/future dental: no  Any missed doses?: no       Is patient compliant with meds? yes       Allergies: No Known Drug Allergies  Anticoagulation Management History:      The patient is taking warfarin and comes in today for a routine follow up visit.  Positive risk factors for bleeding include an age of 75 years or older.  The bleeding index is 'intermediate risk'.  Positive CHADS2 values include History of HTN and Age > 36 years old.  The start date was 05/05/2009.  Anticoagulation responsible provider: Daleen Squibb MD, Maisie Fus.  INR POC: 3.0.  Cuvette Lot#: [id number].  Exp: 06/2011.    Anticoagulation Management Assessment/Plan:      The patient's current anticoagulation dose is Warfarin sodium 5 mg tabs: Take as directed by Coumadin Clinic.  The target INR is 2 - 3.  The next INR is due 07/31/2010.  Anticoagulation instructions were given to patient.  Results were reviewed/authorized by Vashti Hey, RN.  She was notified by Vashti Hey RN.         Prior Anticoagulation Instructions: INR 2.5 Continue coumadin 5mg  once daily except 2.5mg  on Mondays, Wednesdays and Fridays  Current Anticoagulation Instructions: INR 3.0 Continue coumadin 5mg  once daily except 2.5mg  on Mondays, Wednesdays and Fridays

## 2010-12-14 NOTE — Medication Information (Signed)
Summary: ccr-lr  Anticoagulant Therapy  Managed by: Vashti Hey, RN Referring MD: Valera Castle MD PCP: Dr.Hawkins Supervising MD: Dietrich Pates MD, Molly Maduro Indication 1: Atrial Fibrillation (ICD-427.31) Lab Used: [facility] [place] Site: [place] INR POC 1.3 INR RANGE 2 - 3  Dietary changes: no    Health status changes: no    Bleeding/hemorrhagic complications: no    Recent/future hospitalizations: no    Any changes in medication regimen? no    Recent/future dental: no  Any missed doses?: no       Is patient compliant with meds? yes       Allergies: No Known Drug Allergies  Anticoagulation Management History:      The patient is taking warfarin and comes in today for a routine follow up visit.  Positive risk factors for bleeding include an age of 75 years or older.  The bleeding index is 'intermediate risk'.  Positive CHADS2 values include History of HTN and Age > 45 years old.  The start date was 05/05/2009.  Anticoagulation responsible provider: Dietrich Pates MD, Molly Maduro.  INR POC: 1.3.  Cuvette Lot#: [id number].  Exp: 10/11.    Anticoagulation Management Assessment/Plan:      The patient's current anticoagulation dose is Warfarin sodium 5 mg tabs: Take as directed by Coumadin Clinic.  The target INR is 2 - 3.  The next INR is due 03/27/2010.  Anticoagulation instructions were given to patient.  Results were reviewed/authorized by Vashti Hey, RN.  She was notified by Vashti Hey RN.         Prior Anticoagulation Instructions: INR 2.9 Continue coumadin 5mg  once daily except 2.5mg  on Mondays, Wednesdays and Fridays  Current Anticoagulation Instructions: INR 1.3 Take coumadin 7.5mg  tonight and tomorrow night then resume 5mg  once daily except 2.5mg  on Mondays, Wednesdays and Fridays

## 2010-12-14 NOTE — Letter (Signed)
Summary: Clearance Letter  [facility]  [address] [place] [address]   Phone: [phone]  Fax: [phone]    April 18, 2010  Re:     [name] Address:   [address]     [place], [place]  [address] DOB:     02-Oct-1923 MRN:     [id number]   Dear Dr. Emelda Fear,     From a cardiac standpoint it is ok to hold Coumadin for 3 days   prior to surgery then restart ASAP. Please consult [place] while she is   inpatient if we can assist you.           Sincerely,  Thhomas C. Rhena Glace, MD, [facility]

## 2010-12-14 NOTE — Medication Information (Signed)
Summary: ccr-lr      Allergies Added: NKDA Anticoagulant Therapy  Managed by: Teressa Lower, RN Referring MD: Valera Castle MD PCP: Dr.Hawkins Supervising MD: Dietrich Pates MD, Molly Maduro Indication 1: Atrial Fibrillation (ICD-427.31) Lab Used: [facility] [place] Site: [place] INR POC 2.1 INR RANGE 2 - 3  Dietary changes: no    Health status changes: no    Bleeding/hemorrhagic complications: no    Recent/future hospitalizations: no    Any changes in medication regimen? no    Recent/future dental: no  Any missed doses?: no       Is patient compliant with meds? yes       Current Medications (verified): 1)  Warfarin Sodium 5 Mg Tabs (Warfarin Sodium) .... Take As Directed By Coumadin Clinic 2)  Lisinopril 40 Mg Tabs (Lisinopril) .Marland Kitchen.. 1 Tab Once Daily 3)  Metoprolol Tartrate 25 Mg Tabs (Metoprolol Tartrate) .... One By Mouth Two Times A Day 4)  Allegra 180 Mg Tabs (Fexofenadine Hcl) .... Take One Tablet Once A Day 5)  Theragran-M  Tabs (Multiple Vitamins-Minerals) .... Take One Tablet By Mouth Once Daily  Allergies (verified): No Known Drug Allergies  Anticoagulation Management History:      The patient is taking warfarin and comes in today for a routine follow up visit.  Positive risk factors for bleeding include an age of 75 years or older.  The bleeding index is 'intermediate risk'.  Positive CHADS2 values include History of HTN and Age > 18 years old.  The start date was 05/05/2009.  Anticoagulation responsible provider: Dietrich Pates MD, Molly Maduro.  INR POC: 2.1.  Cuvette Lot#: [id number].  Exp: 10/2011.    Anticoagulation Management Assessment/Plan:      The patient's current anticoagulation dose is Warfarin sodium 5 mg tabs: Take as directed by [facility] Clinic.  The target INR is 2 - 3.  The next INR is due 11/23/2010.  Anticoagulation instructions were given to patient.  Results were reviewed/authorized by Teressa Lower, RN.  She was notified by Teressa Lower RN.         Prior Anticoagulation Instructions: INR 2.2 Continue coumadin 5mg  once daily except 2.5mg  on Mondays, Wednesdays and Fridays  Current Anticoagulation Instructions: INR 2.1 TODAY CONTINUE CURRENT WARFARIN DOSE OF 0.5 TABLET ON MONDAYS WEDNESDAYS AND FRIDAYS AND 1 TABLET ALL OTHER DAYS

## 2010-12-14 NOTE — Medication Information (Signed)
Summary: CCR  Anticoagulant Therapy  Managed by: Vashti Hey, RN Referring MD: Valera Castle MD PCP: Dr.Hawkins Supervising MD: Diona Browner MD, Remi Deter Indication 1: Atrial Fibrillation (ICD-427.31) Lab Used: [facility] [place] Site: [place] INR POC 1.9 INR RANGE 2 - 3  Dietary changes: no    Health status changes: no    Bleeding/hemorrhagic complications: no    Recent/future hospitalizations: no    Any changes in medication regimen? no    Recent/future dental: no  Any missed doses?: no       Is patient compliant with meds? yes       Allergies: No Known Drug Allergies  Anticoagulation Management History:      The patient is taking warfarin and comes in today for a routine follow up visit.  Positive risk factors for bleeding include an age of 75 years or older.  The bleeding index is 'intermediate risk'.  Positive CHADS2 values include History of HTN and Age > 65 years old.  The start date was 05/05/2009.  Anticoagulation responsible provider: Diona Browner MD, Remi Deter.  INR POC: 1.9.  Cuvette Lot#: [id number].  Exp: 10/2011.    Anticoagulation Management Assessment/Plan:      The patient's current anticoagulation dose is Warfarin sodium 5 mg tabs: Take as directed by [facility] Clinic.  The target INR is 2 - 3.  The next INR is due 12/25/2010.  Anticoagulation instructions were given to patient.  Results were reviewed/authorized by Vashti Hey, RN.  She was notified by Vashti Hey RN.         Prior Anticoagulation Instructions: INR 2.1 TODAY CONTINUE CURRENT WARFARIN DOSE OF 0.5 TABLET ON MONDAYS WEDNESDAYS AND FRIDAYS AND 1 TABLET ALL OTHER DAYS  Current Anticoagulation Instructions: INR 1.9 Take coumadin 1 tablet tonight then  resume 1 tablet once daily except 1/2 tablet on Mondays, Wednesdays and Fridays

## 2010-12-14 NOTE — Miscellaneous (Signed)
**Note De-Identified [name] Obfuscation** Summary: CBC, BMP, Lipid's, LFT's and TSH -03-05-07  Clinical Lists Changes  Observations: Added new observation of CALCIUM: 8.1 mg/dL (98/09/9146 8:29) Added new observation of ALBUMIN: 3.5 g/dL (56/21/3086 5:78) Added new observation of PROTEIN, TOT: 6.6 g/dL (46/96/2952 8:41) Added new observation of SGPT (ALT): 11 units/L (03/05/2007 9:41) Added new observation of SGOT (AST): 16 units/L (03/05/2007 9:41) Added new observation of ALK PHOS: 92 units/L (03/05/2007 9:41) Added new observation of BILI DIRECT: 0.2 mg/dL (32/44/0102 7:25) Added new observation of CREATININE: 0.68 mg/dL (36/64/4034 7:42) Added new observation of BUN: 16 mg/dL (59/56/3875 6:43) Added new observation of BG RANDOM: 16 mg/dL (32/95/1884 1:66) Added new observation of CO2 PLSM/SER: 21 meq/L (03/05/2007 9:41) Added new observation of CL SERUM: 108 meq/L (03/05/2007 9:41) Added new observation of K SERUM: 3.7 meq/L (03/05/2007 9:41) Added new observation of NA: 143 meq/L (03/05/2007 9:41) Added new observation of LDL: 90 mg/dL (05/11/1600 0:93) Added new observation of HDL: 50 mg/dL (23/55/7322 0:25) Added new observation of TRIGLYC TOT: 129 mg/dL (42/70/6237 6:28) Added new observation of CHOLESTEROL: 166 mg/dL (31/51/7616 0:73) Added new observation of PLATELETK/UL: 309 K/uL (03/05/2007 9:41) Added new observation of MCV: 100.8 fL (03/05/2007 9:41) Added new observation of HCT: 39.8 % (03/05/2007 9:41) Added new observation of HGB: 12.3 g/dL (71/04/2693 8:54) Added new observation of WBC COUNT: 7.4 10*3/microliter (03/05/2007 9:41) Added new observation of TSH: 0.976 microintl units/mL (03/05/2007 9:41)

## 2010-12-14 NOTE — Medication Information (Signed)
Summary: rov/sp  Anticoagulant Therapy  Managed by: Vashti Hey, RN Referring MD: Valera Castle MD PCP: Dr.Hawkins Supervising MD: Dietrich Pates MD, Molly Maduro Indication 1: Atrial Fibrillation (ICD-427.31) Lab Used: [facility] [place] Site: [place] INR POC 2.4 INR RANGE 2 - 3  Dietary changes: no    Health status changes: no    Bleeding/hemorrhagic complications: no    Recent/future hospitalizations: no    Any changes in medication regimen? no    Recent/future dental: no  Any missed doses?: no       Is patient compliant with meds? yes       Allergies: No Known Drug Allergies  Anticoagulation Management History:      The patient is taking warfarin and comes in today for a routine follow up visit.  Positive risk factors for bleeding include an age of 75 years or older.  The bleeding index is 'intermediate risk'.  Positive CHADS2 values include History of HTN and Age > 27 years old.  The start date was 05/05/2009.  Anticoagulation responsible provider: Dietrich Pates MD, Molly Maduro.  INR POC: 2.4.  Cuvette Lot#: [id number].  Exp: 06/2011.    Anticoagulation Management Assessment/Plan:      The patient's current anticoagulation dose is Warfarin sodium 5 mg tabs: Take as directed by [facility]in Clinic.  The target INR is 2 - 3.  The next INR is due 05/31/2010.  Anticoagulation instructions were given to patient.  Results were reviewed/authorized by Vashti Hey, RN.  She was notified by Vashti Hey RN.         Prior Anticoagulation Instructions: INR 1.5  Take 1 1/2 tablets today then resume same dose of 1 tablet every day except 1/2 tablet on Monday, Wednesday and Friday.   Current Anticoagulation Instructions: INR 2.4 Continue coumadin 5mg  once daily except 2.5mg  on Mondays, Wednesdays and Fridays

## 2010-12-14 NOTE — Medication Information (Signed)
Summary: ccr-lr  Anticoagulant Therapy  Managed by: Vashti Hey, RN Referring MD: Valera Castle MD PCP: Dr.Hawkins Supervising MD: Diona Browner MD, Remi Deter Indication 1: Atrial Fibrillation (ICD-427.31) Lab Used: [facility] [place] Site: [place] INR POC 2.8 INR RANGE 2 - 3  Dietary changes: no    Health status changes: no    Bleeding/hemorrhagic complications: no    Recent/future hospitalizations: no    Any changes in medication regimen? no    Recent/future dental: no  Any missed doses?: no       Is patient compliant with meds? yes       Allergies: No Known Drug Allergies  Anticoagulation Management History:      The patient is taking warfarin and comes in today for a routine follow up visit.  Positive risk factors for bleeding include an age of 75 years or older.  The bleeding index is 'intermediate risk'.  Positive CHADS2 values include History of HTN and Age > 25 years old.  The start date was 05/05/2009.  Anticoagulation responsible provider: Diona Browner MD, Remi Deter.  INR POC: 2.8.  Cuvette Lot#: [id number].  Exp: 10/11.    Anticoagulation Management Assessment/Plan:      The patient's current anticoagulation dose is Warfarin sodium 5 mg tabs: Take as directed by Coumadin Clinic.  The target INR is 2 - 3.  The next INR is due 02/09/2010.  Anticoagulation instructions were given to patient.  Results were reviewed/authorized by Vashti Hey, RN.  She was notified by Vashti Hey RN.         Prior Anticoagulation Instructions: INR 2.7 Continue coumadin 5mg  once daily except 2.5mg  on Mondays, Wednesdays and Fridays  Current Anticoagulation Instructions: INR 2.8 Continue coumadin 5mg  once daily except 2.5mg  on Mondays, Wednesdays and Fridays

## 2010-12-14 NOTE — Medication Information (Signed)
Summary: ccr-lr  Anticoagulant Therapy  Managed by: Vashti Hey, RN Referring MD: Valera Castle MD PCP: Dr.Hawkins Supervising MD: Dietrich Pates MD, Molly Maduro Indication 1: Atrial Fibrillation (ICD-427.31) Lab Used: [facility] [place] Site: [place] INR POC 2.7 INR RANGE 2 - 3  Dietary changes: no    Health status changes: no    Bleeding/hemorrhagic complications: no    Recent/future hospitalizations: no    Any changes in medication regimen? no    Recent/future dental: no  Any missed doses?: no       Is patient compliant with meds? yes       Allergies: No Known Drug Allergies  Anticoagulation Management History:      The patient is taking warfarin and comes in today for a routine follow up visit.  Positive risk factors for bleeding include an age of 75 years or older.  The bleeding index is 'intermediate risk'.  Positive CHADS2 values include History of HTN and Age > 20 years old.  The start date was 05/05/2009.  Anticoagulation responsible provider: Dietrich Pates MD, Molly Maduro.  INR POC: 2.7.  Cuvette Lot#: [id number].  Exp: 10/11.    Anticoagulation Management Assessment/Plan:      The patient's current anticoagulation dose is Warfarin sodium 5 mg tabs: Take as directed by Coumadin Clinic.  The target INR is 2 - 3.  The next INR is due 01/12/2010.  Anticoagulation instructions were given to patient.  Results were reviewed/authorized by Vashti Hey, RN.  She was notified by Vashti Hey RN.         Prior Anticoagulation Instructions: INR 2.8 Continue coumadin 5mg  once daily except 2.5mg  on Mondays, Wednesdays and Fridays  Current Anticoagulation Instructions: INR 2.7 Continue coumadin 5mg  once daily except 2.5mg  on Mondays, Wednesdays and Fridays

## 2010-12-14 NOTE — Assessment & Plan Note (Signed)
Summary: 1 YR PACER CK PER CKOUT 04/13/09-DSF      Allergies Added: NKDA  Visit Type:  Follow-up Primary Provider:  Dr.Hawkins  CC:  no cardiology complaints.  History of Present Illness: Mrs. Chalupa returns today for followup.  She is a pleasant 77 yo woman with a h/o PAF, sinus bradycardia and HTN.  He denies c/p, sob or peripheral edema.  She has recently undergone hysterectomy.  No syncope.  Current Medications (verified): 1)  Warfarin Sodium 5 Mg Tabs (Warfarin Sodium) .... Take As Directed By Coumadin Clinic 2)  Lisinopril 40 Mg Tabs (Lisinopril) .Marland Kitchen.. 1 Tab Once Daily 3)  Metoprolol Tartrate 25 Mg Tabs (Metoprolol Tartrate) .... One By Mouth Two Times A Day 4)  Claritin 10 Mg Caps (Loratadine) .... One By Mouth Daily  Allergies (verified): No Known Drug Allergies  Past History:  Past Medical History: Last updated: 04/12/2009  Current Problems:  COPD (ICD-496) ARTHRITIS (ICD-716.90) HYPERTENSION (ICD-401.9) ATRIAL FIBRILLATION (ICD-427.31)  Past Surgical History: Last updated: 04/12/2009 Back Surgery Hip Arthroplasty-Total Knee Arthroscopy  Review of Systems  The patient denies chest pain, syncope, dyspnea on exertion, and peripheral edema.    Vital Signs:  Patient profile:   76 year old female Weight:      194 pounds BMI:     38.02 Pulse rate:   66 / minute BP sitting:   115 / 65  (right arm)  Vitals Entered By: Dreama Saa, CNA (May 10, 2010 9:46 AM)  Physical Exam  General:  obese.   Head:  normocephalic and atraumatic Eyes:  PERRLA/EOM intact; conjunctiva and lids normal. Mouth:  Teeth, gums and palate normal. Oral mucosa normal. Neck:  Neck supple, no JVD. No masses, thyromegaly or abnormal cervical nodes. Lungs:  Clear bilaterally to auscultation with no wheezes or rhonchi. Heart:  regular rate and rhythm, properly paced area no gallop Abdomen:  Bowel sounds positive; abdomen soft and non-tender without masses, organomegaly, or hernias noted.  No hepatosplenomegaly. Msk:  decreased ROM.  using a walker Pulses:  pulses normal in all 4 extremities Extremities:  trace left pedal edema and trace right pedal edema. varicose veins, no DVT Neurologic:  Alert and oriented x 3.   PPM Specifications Following MD:  Lewayne Bunting, MD     PPM Vendor:  [employer]     PPM Model Number:  [id number]     PPM Serial Number:  [id number] PPM DOI:  03/29/2006     PPM Implanting MD:  Sherryl Manges, MD  Lead 1    Location: RA     DOI: 03/29/2006     Model #: [id number]     Serial #: [id number]     Status: active Lead 2    Location: RV     DOI: 03/29/2006     Model #: [id number]     Serial #: [id number]     Status: active  Magnet Response Rate:  BOL 98.6 ERI 86.3  Indications:  SND   PPM Follow Up Battery Voltage:  2.78 V     Battery Est. Longevity:  7.75-50yrs       PPM Device Measurements Atrium  Amplitude: 0.2 mV, Impedance: 477 ohms,  Right Ventricle  Amplitude: 12.0 mV, Impedance: 599 ohms, Threshold: 0.375 V at 0.5 msec  Episodes MS Episodes:  3     Percent Mode Switch:  100%     Coumadin:  Yes Ventricular High Rate:  0     Atrial Pacing:  <1%     Ventricular  Pacing:  15%  Parameters Mode:  DDI     Lower Rate Limit:  60     Paced AV Delay:  250     Next Cardiology Appt Due:  10/12/2010 Tech Comments:  PT IN AF 100% OF TIME. NORMAL DEVICE FUNCTION. A SENSITIVITY SET AT 0.33mV.  UNDERSENSING AT 0.5 UNIPOLAR.  CHANGED RV OUTPUT FROM 0.5 TO 0.625V.  ROV IN 6 MTHS RDS. Vella Kohler  May 10, 2010 10:21 AM MD Comments:  Agree with above.  Impression & Recommendations:  Problem # 1:  PACEMAKER, PERMANENT (ICD-V45.01) Her PPM is working normally.  Will recheck in several months.  Problem # 2:  HYPERTENSION (ICD-401.9) Her blood pressure is well controlled.  A low sodium diet is requested. Her updated medication list for this problem includes:    Lisinopril 40 Mg Tabs (Lisinopril) .Marland Kitchen... 1 tab once daily    Metoprolol Tartrate 25 Mg Tabs (Metoprolol tartrate)  ..... One by mouth two times a day  Problem # 3:  ATRIAL FIBRILLATION (ICD-427.31) Her symptoms are well controlled and her atrial fib is rare.  Continue meds as below. Her updated medication list for this problem includes:    Warfarin Sodium 5 Mg Tabs (Warfarin sodium) .Marland Kitchen... Take as directed by coumadin clinic    Metoprolol Tartrate 25 Mg Tabs (Metoprolol tartrate) ..... One by mouth two times a day

## 2010-12-14 NOTE — Medication Information (Signed)
Summary: ccr-lr  Anticoagulant Therapy  Managed by: Vashti Hey, RN Referring MD: Valera Castle MD PCP: Dr.Hawkins Supervising MD: Diona Browner MD, Remi Deter Indication 1: Atrial Fibrillation (ICD-427.31) Lab Used: [facility] [place] Site: [place] INR POC 2.5 INR RANGE 2 - 3  Dietary changes: no    Health status changes: no    Bleeding/hemorrhagic complications: no    Recent/future hospitalizations: yes       Details: scheduled for lap hysterectomy by Dr Emelda Fear on 04/25/10  Any changes in medication regimen? no    Recent/future dental: no  Any missed doses?: yes     Details: Pt to stop coumadin 3 days prior to procedure and resume ASAP after surgery  Is patient compliant with meds? yes       Allergies: No Known Drug Allergies  Anticoagulation Management History:      The patient is taking warfarin and comes in today for a routine follow up visit.  Positive risk factors for bleeding include an age of 76 years or older.  The bleeding index is 'intermediate risk'.  Positive CHADS2 values include History of HTN and Age > 15 years old.  The start date was 05/05/2009.  Anticoagulation responsible provider: Diona Browner MD, Remi Deter.  INR POC: 2.5.  Cuvette Lot#: [id number].  Exp: 10/11.    Anticoagulation Management Assessment/Plan:      The patient's current anticoagulation dose is Warfarin sodium 5 mg tabs: Take as directed by Coumadin Clinic.  The target INR is 2 - 3.  The next INR is due 05/01/2010.  Anticoagulation instructions were given to patient.  Results were reviewed/authorized by Vashti Hey, RN.  She was notified by Vashti Hey RN.         Prior Anticoagulation Instructions: INR 1.8 Take coumadin 5mg  tonight then resume 5mg  once daily except 2.5mg  on M,W,F  Current Anticoagulation Instructions: INR 2.5 Continue coumadin 5mg  once daily except 2.5mg  on Mondays, Wednesdays and Fridays Scheduled for hysterectomy on 04/25/10.  Last dose of coumadin on 04/21/10.   Restart coumadin at above dose ASAP after surgery.

## 2010-12-14 NOTE — Medication Information (Signed)
Summary: ccr-lr  Anticoagulant Therapy  Managed by: Vashti Hey, RN Referring MD: Valera Castle MD Supervising MD: Daleen Squibb MD, Maisie Fus Indication 1: Atrial Fibrillation (ICD-427.31) Lab Used: [facility] [place] Site: [place] INR POC 2.8 INR RANGE 2 - 3  Dietary changes: no    Health status changes: no    Bleeding/hemorrhagic complications: no    Recent/future hospitalizations: no    Any changes in medication regimen? no    Recent/future dental: no  Any missed doses?: no       Is patient compliant with meds? yes       Allergies: No Known Drug Allergies  Anticoagulation Management History:      The patient is taking warfarin and comes in today for a routine follow up visit.  Positive risk factors for bleeding include an age of 75 years or older.  The bleeding index is 'intermediate risk'.  Positive CHADS2 values include History of HTN and Age > 35 years old.  The start date was 05/05/2009.  Anticoagulation responsible provider: Daleen Squibb MD, Maisie Fus.  INR POC: 2.8.  Cuvette Lot#: [id number].  Exp: 10/11.    Anticoagulation Management Assessment/Plan:      The patient's current anticoagulation dose is Warfarin sodium 5 mg tabs: Take as directed by Coumadin Clinic.  The target INR is 2 - 3.  The next INR is due 12/15/2009.  Anticoagulation instructions were given to patient.  Results were reviewed/authorized by Vashti Hey, RN.  She was notified by Vashti Hey RN.         Prior Anticoagulation Instructions: INR 2.6 Continue coumadin 5mg  once daily except 2.5mg  on Mondays, Wednesdays and Fridays  Current Anticoagulation Instructions: INR 2.8 Continue coumadin 5mg  once daily except 2.5mg  on Mondays, Wednesdays and Fridays

## 2010-12-14 NOTE — Letter (Signed)
Summary: [facility]   Imported By: Faythe Ghee 04/17/2010 13:21:25  _____________________________________________________________________  External Attachment:    Type:   Image     Comment:   External Document

## 2010-12-14 NOTE — Cardiovascular Report (Signed)
Summary: Office Visit   Office Visit   Imported By: Roderic Ovens 11/10/2010 16:19:36  _____________________________________________________________________  External Attachment:    Type:   Image     Comment:   External Document

## 2010-12-14 NOTE — Medication Information (Signed)
Summary: ccr-lr  Anticoagulant Therapy  Managed by: Vashti Hey, RN Referring MD: Valera Castle MD PCP: Dr.Hawkins Supervising MD: Dietrich Pates MD, Molly Maduro Indication 1: Atrial Fibrillation (ICD-427.31) Lab Used: [facility] [place] Site: [place] INR POC 2.7 INR RANGE 2 - 3  Dietary changes: no    Health status changes: no    Bleeding/hemorrhagic complications: no    Recent/future hospitalizations: no    Any changes in medication regimen? no    Recent/future dental: no  Any missed doses?: no       Is patient compliant with meds? yes       Allergies: No Known Drug Allergies  Anticoagulation Management History:      The patient is taking warfarin and comes in today for a routine follow up visit.  Positive risk factors for bleeding include an age of 75 years or older.  The bleeding index is 'intermediate risk'.  Positive CHADS2 values include History of HTN and Age > 75 years old.  The start date was 05/05/2009.  Anticoagulation responsible provider: Dietrich Pates MD, Molly Maduro.  INR POC: 2.7.  Cuvette Lot#: [id number].  Exp: 06/2011.    Anticoagulation Management Assessment/Plan:      The patient's current anticoagulation dose is Warfarin sodium 5 mg tabs: Take as directed by [facility]madin Clinic.  The target INR is 2 - 3.  The next INR is due 08/28/2010.  Anticoagulation instructions were given to patient.  Results were reviewed/authorized by Vashti Hey, RN.  She was notified by Vashti Hey RN.         Prior Anticoagulation Instructions: INR 3.0 Continue coumadin 5mg  once daily except 2.5mg  on Mondays, Wednesdays and Fridays  Current Anticoagulation Instructions: INR 2.7 Continue coumadin 5mg  once daily except 2.5mg  on Mondays, Wednesdays and Fridays

## 2010-12-14 NOTE — Medication Information (Signed)
Summary: ccr-lr  Anticoagulant Therapy  Managed by: Vashti Hey, RN Referring MD: Valera Castle MD PCP: Dr.Hawkins Supervising MD: Dietrich Pates MD, Molly Maduro Indication 1: Atrial Fibrillation (ICD-427.31) Lab Used: [facility]  Site: [place] INR POC 1.8 INR RANGE 2 - 3  Dietary changes: no    Health status changes: yes       Details: Had pelvic polyp removal  by Dr Emelda Fear  Bleeding/hemorrhagic complications: no    Recent/future hospitalizations: no    Any changes in medication regimen? no    Recent/future dental: no  Any missed doses?: yes     Details: Missed 2 doses after pelvic polyp removal  Is patient compliant with meds? yes       Allergies: No Known Drug Allergies  Anticoagulation Management History:      The patient is taking warfarin and comes in today for a routine follow up visit.  Positive risk factors for bleeding include an age of 75 years or older.  The bleeding index is 'intermediate risk'.  Positive CHADS2 values include History of HTN and Age > 75 years old.  The start date was 05/05/2009.  Anticoagulation responsible provider: Dietrich Pates MD, Molly Maduro.  INR POC: 1.8.  Cuvette Lot#: [id number].  Exp: 10/11.    Anticoagulation Management Assessment/Plan:      The patient's current anticoagulation dose is Warfarin sodium 5 mg tabs: Take as directed by [facility] Clinic.  The target INR is 2 - 3.  The next INR is due 04/19/2010.  Anticoagulation instructions were given to patient.  Results were reviewed/authorized by Vashti Hey, RN.  She was notified by Vashti Hey RN.         Prior Anticoagulation Instructions: INR 1.3 Take coumadin 7.5mg  tonight and tomorrow night then resume 5mg  once daily except 2.5mg  on Mondays, Wednesdays and Fridays  Current Anticoagulation Instructions: INR 1.8 Take coumadin 5mg  tonight then resume 5mg  once daily except 2.5mg  on M,W,F

## 2010-12-14 NOTE — Medication Information (Signed)
Summary: ccr-lr  Anticoagulant Therapy  Managed by: Vashti Hey, RN Referring MD: Valera Castle MD PCP: Dr.Hawkins Supervising MD: Dietrich Pates MD, Molly Maduro Indication 1: Atrial Fibrillation (ICD-427.31) Lab Used: [facility]  Site: [place] INR POC 2.9 INR RANGE 2 - 3  Dietary changes: no    Health status changes: no    Bleeding/hemorrhagic complications: no    Recent/future hospitalizations: no    Any changes in medication regimen? no    Recent/future dental: no  Any missed doses?: no       Is patient compliant with meds? yes       Allergies: No Known Drug Allergies  Anticoagulation Management History:      The patient is taking warfarin and comes in today for a routine follow up visit.  Positive risk factors for bleeding include an age of 75 years or older.  The bleeding index is 'intermediate risk'.  Positive CHADS2 values include History of HTN and Age > 40 years old.  The start date was 05/05/2009.  Anticoagulation responsible provider: Dietrich Pates MD, Molly Maduro.  INR POC: 2.9.  Cuvette Lot#: [id number].  Exp: 10/11.    Anticoagulation Management Assessment/Plan:      The patient's current anticoagulation dose is Warfarin sodium 5 mg tabs: Take as directed by [facility]in Clinic.  The target INR is 2 - 3.  The next INR is due 03/09/2010.  Anticoagulation instructions were given to patient.  Results were reviewed/authorized by Vashti Hey, RN.  She was notified by Vashti Hey RN.         Prior Anticoagulation Instructions: INR 2.8 Continue coumadin 5mg  once daily except 2.5mg  on Mondays, Wednesdays and Fridays  Current Anticoagulation Instructions: INR 2.9 Continue coumadin 5mg  once daily except 2.5mg  on Mondays, Wednesdays and Fridays

## 2010-12-25 ENCOUNTER — Encounter: Payer: Self-pay | Admitting: Cardiology

## 2010-12-25 ENCOUNTER — Encounter (INDEPENDENT_AMBULATORY_CARE_PROVIDER_SITE_OTHER): Payer: MEDICARE

## 2010-12-25 DIAGNOSIS — Z7901 Long term (current) use of anticoagulants: Secondary | ICD-10-CM

## 2010-12-25 DIAGNOSIS — I4891 Unspecified atrial fibrillation: Secondary | ICD-10-CM

## 2011-01-03 NOTE — Medication Information (Signed)
Summary: ccr-lr [place]  Anticoagulant Therapy  Managed by: Vashti Hey, RN Referring MD: Valera Castle MD PCP: Dr.Hawkins Supervising MD: Dietrich Pates MD, Molly Maduro Indication 1: Atrial Fibrillation (ICD-427.31) Lab Used: [facility] [place] Site: [place] INR POC 2.2 INR RANGE 2 - 3  Dietary changes: no    Health status changes: no    Bleeding/hemorrhagic complications: no    Recent/future hospitalizations: no    Any changes in medication regimen? no    Recent/future dental: no  Any missed doses?: no       Is patient compliant with meds? yes       Allergies: No Known Drug Allergies  Anticoagulation Management History:      The patient is taking warfarin and comes in today for a routine follow up visit.  Positive risk factors for bleeding include an age of 75 years or older.  The bleeding index is 'intermediate risk'.  Positive CHADS2 values include History of HTN and Age > 75 years old.  The start date was 05/05/2009.  Anticoagulation responsible provider: Dietrich Pates MD, Molly Maduro.  INR POC: 2.2.  Cuvette Lot#: [id number].  Exp: 10/2011.    Anticoagulation Management Assessment/Plan:      The patient's current anticoagulation dose is Warfarin sodium 5 mg tabs: Take as directed by [facility] Clinic.  The target INR is 2 - 3.  The next INR is due 01/31/2011.  Anticoagulation instructions were given to patient.  Results were reviewed/authorized by Vashti Hey, RN.  She was notified by Vashti Hey RN.         Prior Anticoagulation Instructions: INR 1.9 Take coumadin 1 tablet tonight then  resume 1 tablet once daily except 1/2 tablet on Mondays, Wednesdays and Fridays  Current Anticoagulation Instructions: INR 2.2 Continue coumadin 5mg  once daily except 2.5mg  on Mondays, Wednesdays and Fridays

## 2011-01-11 ENCOUNTER — Encounter: Payer: Self-pay | Admitting: *Deleted

## 2011-01-28 LAB — CBC
HCT: 31.8 % — ABNORMAL LOW (ref 36.0–46.0)
Hemoglobin: 10.2 g/dL — ABNORMAL LOW (ref 12.0–15.0)
MCV: 97.1 fL (ref 78.0–100.0)
RBC: 3.1 MIL/uL — ABNORMAL LOW (ref 3.87–5.11)
RBC: 3.27 MIL/uL — ABNORMAL LOW (ref 3.87–5.11)
WBC: 8.8 10*3/uL (ref 4.0–10.5)
WBC: 9.3 10*3/uL (ref 4.0–10.5)

## 2011-01-28 LAB — BASIC METABOLIC PANEL
Calcium: 8 mg/dL — ABNORMAL LOW (ref 8.4–10.5)
Creatinine, Ser: 0.69 mg/dL (ref 0.4–1.2)
GFR calc Af Amer: >60 mL/min (ref >60)

## 2011-01-29 ENCOUNTER — Encounter: Payer: Self-pay | Admitting: Cardiology

## 2011-01-29 DIAGNOSIS — I4891 Unspecified atrial fibrillation: Secondary | ICD-10-CM

## 2011-01-29 LAB — COMPREHENSIVE METABOLIC PANEL
ALT: 15 U/L (ref 0–35)
Alkaline Phosphatase: 75 U/L (ref 39–117)
CO2: 27 mEq/L (ref 19–32)
Glucose, Bld: 115 mg/dL — ABNORMAL HIGH (ref 70–99)
Potassium: 4.6 mEq/L (ref 3.5–5.1)
Sodium: 137 mEq/L (ref 135–145)
Total Protein: 6.7 g/dL (ref 6.0–8.3)

## 2011-01-29 LAB — CBC
Hemoglobin: 11.5 g/dL — ABNORMAL LOW (ref 12.0–15.0)
RBC: 3.69 MIL/uL — ABNORMAL LOW (ref 3.87–5.11)
RDW: 19.4 % — ABNORMAL HIGH (ref 11.5–15.5)

## 2011-01-29 LAB — PROTIME-INR
INR: 1.2 (ref 0.00–1.49)
INR: 2.42 — ABNORMAL HIGH (ref 0.00–1.49)
Prothrombin Time: 15.1 seconds (ref 11.6–15.2)

## 2011-01-29 LAB — ABO/RH: ABO/RH(D): O POS

## 2011-01-29 LAB — DIFFERENTIAL
Basophils Relative: 1 % (ref 0–1)
Eosinophils Absolute: 0.2 10*3/uL (ref 0.0–0.7)
Monocytes Absolute: 0.8 10*3/uL (ref 0.1–1.0)
Monocytes Relative: 10 % (ref 3–12)
Neutrophils Relative %: 58 % (ref 43–77)

## 2011-01-29 LAB — SURGICAL PCR SCREEN: MRSA, PCR: NEGATIVE

## 2011-01-31 ENCOUNTER — Ambulatory Visit (INDEPENDENT_AMBULATORY_CARE_PROVIDER_SITE_OTHER): Payer: MEDICARE | Admitting: *Deleted

## 2011-01-31 ENCOUNTER — Ambulatory Visit: Payer: Self-pay | Admitting: Cardiology

## 2011-01-31 DIAGNOSIS — I4891 Unspecified atrial fibrillation: Secondary | ICD-10-CM

## 2011-01-31 LAB — POCT INR: INR: 2.1

## 2011-02-20 LAB — BASIC METABOLIC PANEL
Calcium: 8.7 mg/dL (ref 8.4–10.5)
GFR calc Af Amer: >60 mL/min (ref >60)
GFR calc non Af Amer: >60 mL/min (ref >60)
Sodium: 136 mEq/L (ref 135–145)

## 2011-02-20 LAB — HEMOGLOBIN AND HEMATOCRIT, BLOOD
HCT: 35.7 % — ABNORMAL LOW (ref 36.0–46.0)
Hemoglobin: 12.1 g/dL (ref 12.0–15.0)

## 2011-02-28 ENCOUNTER — Ambulatory Visit (INDEPENDENT_AMBULATORY_CARE_PROVIDER_SITE_OTHER): Payer: MEDICARE | Admitting: *Deleted

## 2011-02-28 DIAGNOSIS — I4891 Unspecified atrial fibrillation: Secondary | ICD-10-CM

## 2011-02-28 LAB — POCT INR: INR: 1.9

## 2011-03-16 ENCOUNTER — Ambulatory Visit (INDEPENDENT_AMBULATORY_CARE_PROVIDER_SITE_OTHER): Payer: MEDICARE | Admitting: Cardiology

## 2011-03-16 ENCOUNTER — Encounter: Payer: Self-pay | Admitting: Cardiology

## 2011-03-16 VITALS — BP 126/78 | HR 81 | Ht 65.0 in | Wt 186.0 lb

## 2011-03-16 DIAGNOSIS — I4891 Unspecified atrial fibrillation: Secondary | ICD-10-CM

## 2011-03-16 DIAGNOSIS — Z950 Presence of cardiac pacemaker: Secondary | ICD-10-CM

## 2011-03-16 DIAGNOSIS — I10 Essential (primary) hypertension: Secondary | ICD-10-CM

## 2011-03-16 NOTE — Assessment & Plan Note (Signed)
Improved

## 2011-03-16 NOTE — Assessment & Plan Note (Signed)
Asymptomatic. No change in treatment.

## 2011-03-16 NOTE — Patient Instructions (Signed)
Your physician recommends that you continue on your current medications as directed. Please refer to the Current Medication list given to you today.  Your physician recommends that you schedule a follow-up appointment in: 6 months  

## 2011-03-16 NOTE — Assessment & Plan Note (Signed)
No change 

## 2011-03-16 NOTE — Progress Notes (Signed)
   Patient ID: PHUONG HILLARY, female    DOB: [birth year], 77 y.o.   MRN: [id number]  HPI  Therisa returns for Eand M of her CAF, anticoagulation, hypertension and history of bradycardia S/P PPM. She offers no complaint. No melena of blood loss. No falls, still uses a walker. She is very compliant.    Review of Systems  All other systems reviewed and are negative.      Physical Exam  Constitutional: She is oriented to person, place, and time. She appears well-developed and well-nourished.       obese  HENT:  Head: Normocephalic and atraumatic.  Eyes: EOM are normal. Pupils are equal, round, and reactive to light.  Neck: Neck supple. No JVD present. No tracheal deviation present. No thyromegaly present.       No bruit  Cardiovascular: Normal rate, normal heart sounds and normal pulses.  An irregularly irregular rhythm present.  No extrasystoles are present.  Pulmonary/Chest: Effort normal and breath sounds normal.  Abdominal: Soft. Bowel sounds are normal.  Musculoskeletal: She exhibits no edema.  Neurological: She is alert and oriented to person, place, and time.  Skin: Skin is warm and dry.  Psychiatric: She has a normal mood and affect.

## 2011-03-24 ENCOUNTER — Other Ambulatory Visit: Payer: Self-pay | Admitting: Cardiology

## 2011-03-27 NOTE — Assessment & Plan Note (Signed)
[facility]                            CARDIOLOGY OFFICE NOTE   NAME:[name], [name]                      MRN:          [id number]  DATE:05/08/2007                            DOB:          Jun 08, 1923    Tinea comes in today for further management of the following issues:  1. Paroxysmal atrial fibrillation.  Pacemaker interrogation today      shows that she has been in atrial fibrillation 27% of the time.      Her rates are running in the 80s.  She has been asymptomatic.  2. Anticoagulation.  She is now off Coumadin for spinal surgery with      Dr. Wyline Mood at [facility] next week.  She is      confined to a wheelchair and has a lot of trouble getting around      walking at all.  3. Chronic systolic heart failure.  4. Hypertension.   Unfortunately she lost her second husband about 2 weeks ago.  We had a  long talk about it and she of course was emotional.   She has no cardiac complaints today.   MEDICATIONS:  1. Claritin 10 mg a day.  2. Coumadin is on hold.  3. Lisinopril 40 mg a day.  4. Metoprolol 12.5 b.i.d.   EXAMINATION:  Her blood pressure is 124/82, her pulse is 80 and  irregular.  EKG shows atrial fibrillation with occasional ventricular  pacing.  Her weight is 201.  HEENT:  Unchanged.  Carotid upstrokes were equal bilaterally without  bruits.  There is no obvious JVD, thyroid is not enlarged, trachea is  midline.  LUNGS:  Clear.  HEART:  Reveals a displaced PMI, she has an irregular rate and rhythm,  soft systolic murmur.  ABDOMINAL:  Soft with good bowel sounds.  EXTREMITIES:  Reveal no edema, pulses are present.  NEURO:  Exam is intact except she is not able to walk without help and  she is in a wheelchair.   ASSESSMENT:  I think Lulani is doing well from a cardiovascular  standpoint.  She is at a moderate risk for a stroke off of Coumadin,  however, with her having spinal surgery I am very reticent of  putting  her on Lovenox.  I explained this to her today.   PLAN:  1. Continue current medications.  2. As soon as Dr. Wyline Mood thinks is appropriate I have asked her to      start back on Coumadin.  She will have to have this monitored every      week for the first couple weeks until it gets in a      therapeutic range.  3. I will plan on seeing her back in 3 months.     Thomas C. Daleen Squibb, MD, [facility]  Electronically Signed    TCW/MedQ  DD: 05/08/2007  DT: 05/08/2007  Job #: [id number]   cc:   Doran Clay. Juanetta Gosling, M.D.

## 2011-03-27 NOTE — Assessment & Plan Note (Signed)
[facility]                            CARDIOLOGY OFFICE NOTE   [name], CAMPUS                      MRN:          [id number]  DATE:07/25/2007                            DOB:          13-Jan-1923    [name] comes in today for a preoperative clearance for thoracic spine  surgery for thoracic disk disease.  This is going to be performed next  Thursday, September 18, by Dr. Donette Larry at [facility].   She is asymptomatic from a cardiac disease.   PROBLEM LIST:  1. Paroxysmal atrial fibrillation.  She has a history of tachy brady      syndrome and has a pacer implanted.  2. Anticoagulation.  She stopped her Coumadin in anticipation of      surgery next week yesterday.  3. Chronic systolic heart failure, currently stable.  4. Normal left ventricular systolic function.  5. Hypertension.  6. Negative stress Myoview ruling out obstructive coronary disease,      December 06, 2004.  Ejection fraction of 70%.   CURRENT MEDICATIONS:  1. Claritin 10 mg a day.  2. Lisinopril 40 mg a day.  3. Metoprolol 12.5 mg b.i.d.  4. Coumadin is on hold.   PHYSICAL EXAMINATION:  Her blood pressure is 122/72, her pulse is 78 and  irregular.   EKG shows atrial fibrillation with low voltage, poor R-wave progression  across the anterior precordium which is old.  Her EKG is stable.   She is in a wheelchair.  Her weight is 200.  HEENT:  Normocephalic, atraumatic.  PERRLA.  Extraocular movements  intact.  Sclerae clear, facial symmetry is normal.  Carotid upstrokes are equal bilaterally without bruits, no JVD.  Thyroid  is not enlarged, trachea is midline.  LUNGS:  Clear.  HEART:  Reveals an irregular rate and rhythm with a soft systolic murmur  at the apex.  ABDOMINAL EXAM:  Soft, obese, organomegaly is hard to detect.  EXTREMITIES:  Reveal no cyanosis or clubbing, there is trace edema,  pulses are intact.  NEURO EXAM:  Grossly intact except for her  gait.   I have had a nice chat with [name] today.  I have cleared her for  surgery with a low cardiovascular risk.  She obviously needs to be off  of Coumadin for spinal surgery.  However, I  would like Dr. Wyline [name] to start this back as soon as possible after  surgery.  She will need frequent pro time's until her INR is back into  the 2-3 range postop.     Thomas C. Daleen Squibb, MD, [facility]  Electronically Signed    TCW/MedQ  DD: 07/25/2007  DT: 07/26/2007  Job #: [id number]   cc:   Donette Larry, MD

## 2011-03-27 NOTE — Assessment & Plan Note (Signed)
[facility]                       [facility] CARDIOLOGY OFFICE NOTE   [name], [name]                      MRN:          [id number]  DATE:03/28/2009                            DOB:          04-Nov-1923    Ms. [name] returns today for followup.   PROBLEMS:  1. Sinus node dysfunction status post permanent pacemaker      implantation.  She denies any palpitations.  On her last visit, I      increased her metoprolol to 25 mg p.o. b.i.d. because her heart      rate was still running fast.  She is now 60 beats per minute and      has no complaints.  2. Chronic atrial fibrillation with a well-controlled ventricular      rate.  3. Chronic diastolic congestive heart failure, currently stable.  4. Normal left ventricular systolic function.  5. Anticoagulation.  She would like to change her Coumadin checks to      [place].  6. Hypertension.   Her current meds are:  1. Claritin 10 mg per day.  2. Lisinopril 40 mg a day.  3. Warfarin as directed.  4. Metoprolol 25 mg p.o. b.i.d.  5. Eye drops.   PHYSICAL EXAMINATION:  VITAL SIGNS:  Blood pressure is 110/80, her pulse  is 60 and regular, her weight is 196.  HEENT:  Normal.  NECK:  Supple.  Carotid upstrokes are equal bilateral bruits.  No JVD.  Thyroid is not enlarged.  Trachea is midline.  LUNGS:  Clear to auscultation percussion.  HEART:  A regular rate and rhythm.  She is properly paced.  ABDOMEN:  Soft, good bowel sounds.  No midline bruit.  EXTREMITIES:  No edema.  Pulses are brisk bilaterally.  NEUROLOGIC:  Grossly intact except her gait is a little bit slow from  her previous orthopedic issues.   ASSESSMENT AND PLAN:  [name] is doing well.  We checked a INR today  and it is 2.4.  We will enroll her in the Coumadin Clinic here for  convenience.  She would also like to have her pacer checked here in the  future.  We will make these arrangements as well.     Thomas C. Daleen Squibb, MD,  [facility]  Electronically Signed    TCW/MedQ  DD: 03/28/2009  DT: 03/29/2009  Job #: [id number]

## 2011-03-27 NOTE — Assessment & Plan Note (Signed)
[facility]                            CARDIOLOGY OFFICE NOTE   NAME:Fleeman, MINOLA GUIN                      MRN:          [id number]  DATE:10/31/2007                            DOB:          1923/08/07    Catalena comes in today for further management of the following problems:  1. Sinus node dysfunction status post permanent pacemaker      implantation.  2. Chronic diastolic congestive heart failure currently stable.  3. Normal left ventricular systolic function.  4. Anticoagulation.  She is back on Coumadin after having her thoracic      spine surgery by Dr. Wyline Mood on September 18th at [facility].  5. Hypertension.   She has been doing physical therapy and her right leg is getting  stronger.  She is still not able to get out of the wheelchair and use  her leg like she wants to.  She really is anxious to get up and also  drive her car!   She is currently on Claritin 10 mg daily, Lisinopril 40 mg a day,  Methocarbamol 750 mg a day, Senna-C tab daily, and Coumadin.   PHYSICAL EXAMINATION:  VITAL SIGNS:  Her blood pressure today is 153/90,  pulse 71 and regular, weight is 182.  HEENT:  Unchanged.  NECK:  Carotid upstrokes are equal bilaterally without bruits, no JVD,  thyroid is not enlarged, trachea is midline.  LUNGS:  Clear.  HEART:  Regular rate and rhythm.  ABDOMEN:  Soft.  EXTREMITIES:  No edema, pulses are present.   Lithzy is doing well from our standpoint.  We will plan on seeing her  back again in three months.  We will have to keep a close watch on her  blood pressure.     Thomas C. Daleen Squibb, MD, [facility]  Electronically Signed    TCW/MedQ  DD: 10/31/2007  DT: 11/01/2007  Job #: [id number]

## 2011-03-27 NOTE — Assessment & Plan Note (Signed)
[facility]                            CARDIOLOGY OFFICE NOTE   JAZIYAH, GRADEL                      MRN:          [id number]  DATE:11/29/2008                            DOB:          09/03/1923    Ms. Hessler comes in today for followup.   She has not had any tachy palpitations, presyncope, or syncope.  She  denies any orthostatic symptoms.  She has had no chest pain, orthopnea,  PND, or peripheral edema.   PROBLEM LIST:  Please see the note from July 16, 2008.   MEDICATIONS:  1. Claritin 10 mg per day.  2. Lisinopril 40 mg a day.  3. Warfarin as directed.  4. Metoprolol 12.5 b.i.d.   I added metoprolol at last visit because her heart rate was increased.   PHYSICAL EXAMINATION:  VITAL SIGNS:  Blood pressure today is 139/82, her  pulse is 95 and irregular, weight is 197 and stable.  HEENT:  She is normal.  She wears glasses.  NECK:  Carotid upstrokes were equal bilaterally without bruits, no JVD.  Thyroid is not enlarged.  Trachea is midline.  LUNGS:  Clear to auscultation and percussion.  HEART:  A poorly appreciated PMI.  Irregular rate and rhythm.  ABDOMEN:  Soft, good bowel sounds.  No midline bruit.  No hepatomegaly.  EXTREMITIES:  No cyanosis, clubbing, or edema.  Pulses are intact.  NEUROLOGIC:  Intact.   Electrocardiogram shows atrial fib with a rate of about 90.  There has  been no changes.   ASSESSMENT AND PLAN:  Tashera's heart rate is still elevated.  We do not  have to worry about bradycardia anymore because of her pacer.  I have  increased her metoprolol to 25 b.i.d.  We will plan on seeing her back  again in 6 months.     Thomas C. Daleen Squibb, MD, [facility]  Electronically Signed    TCW/MedQ  DD: 11/29/2008  DT: 11/30/2008  Job #: [id number]

## 2011-03-27 NOTE — Assessment & Plan Note (Signed)
[facility]                            CARDIOLOGY OFFICE NOTE   [name], [name]                      MRN:          [id number]  DATE:07/16/2008                            DOB:          11-30-1922    [name] comes in today for followup of the following issues:  1. Sinus node dysfunction status post permanent pacemaker      implantation.  We stopped beta-blocker in the past at low dose for      bradycardia.  She is usually not paced.  She feels no tachy      palpitations.  She states she occasionally has a cough, which she      thinks is her pacemaker.  2. Chronic atrial fib with a well-controlled ventricular rate.  3. Chronic diastolic congestive heart failure, which has been      remarkably stable.  4. Normal left ventricular systolic function.  5. Anticoagulation.  Other than driving back and forth to get her      protimes with her family, because she cannot drive, she is doing      well with this.  6. Hypertension.   She denies any orthopnea, PND or peripheral edema.  She has had no  presyncope or syncope.   MEDICATIONS:  1. Claritin 10 mg a day.  2. Lisinopril 40 mg a day.  3. Coumadin.   PHYSICAL EXAMINATION:  VITALS:  Blood pressure is 135/81, her heart rate  is 100 and irregular.  Her weight is 197.  HEENT:  Normal.  NECK:  Carotid upstrokes were equal bilaterally without bruits, no JVD.  Thyroid is not enlarged.  Trachea is midline.  LUNGS:  Clear.  HEART:  Irregular rate and rhythm, poorly appreciated PMI.  ABDOMEN:  Soft, good bowel sounds.  No midline bruit.  EXTREMITIES:  No cyanosis, clubbing or edema.  Pulses are intact.  NEURO:  Intact.   [name]'s heart rate is elevated and I think, now she has a permanent  pacer so we can put her back on low-dose beta-blocker.  I will start her  on metoprolol 12.5 b.i.d.  I will plan on seeing her back in 6 months.     Thomas C. Daleen Squibb, MD, [facility]  Electronically Signed    TCW/MedQ   DD: 07/16/2008  DT: 07/17/2008  Job #: [id number]

## 2011-03-27 NOTE — Assessment & Plan Note (Signed)
[facility]                         ELECTROPHYSIOLOGY OFFICE NOTE   NAME:BUSICKGerrie, Castiglia                      MRN:          [id number]  DATE:04/13/2008                            DOB:          10-Nov-1923    Ms. Leiva is seen in follow-up for pacemaker implanted for sinus node  dysfunction.  She has intercurrently developed atrial fibrillation which  appears to be permanent.  She has few cardiac complaints.  She is  largely nonambulatory following a back surgery that was complicated by  permanent nerve injury; thankfully she does not appear to be in a great  deal of pain from this.   MEDICATIONS:  1. Lisinopril 40 mg.  2. Claritin 10 mg.  3. Warfarin.   EXAMINATION:  Her blood pressure is 127/80, her pulse was 88.  LUNGS:  Clear.  Her heart sounds were regular.  EXTREMITIES:  Without edema.   Interrogation of her [employer] pulse generator demonstrates that she is  atrial fibrillation with atrial impedance of 442, an R wave of 12,  impedance of 608, a threshold of 0.25at 0.5.  Battery voltage was 2.78.   IMPRESSION:  1. One sinus node dysfunction.  2. Atrial fibrillation.  3. Status post pacer for #1.   Ms. Notte is doing fine from an arrhythmia point of view.  She is  atrial fibrillation, which is now permanent.  She is on Coumadin and has  back-up brady pacing.  We will see her again in 1 year's time.     Duke Salvia, MD, [facility]  Electronically Signed    SCK/MedQ  DD: 04/13/2008  DT: 04/13/2008  Job #: [id number]

## 2011-03-27 NOTE — Assessment & Plan Note (Signed)
[facility]                            CARDIOLOGY OFFICE NOTE   [name], [name]                      MRN:          [id number]  DATE:01/29/2008                            DOB:          05-Jul-1923    [name] returns today for further management of the following problems:  1. Sinus node dysfunction status post permanent pacemaker      implantation.  She is not typically paced.  2. Chronic atrial fibrillation with a well-controlled ventricular      rate.  3. Chronic diastolic congestive failure, currently stable.  4. Normal left ventricular systolic function.  5. Anticoagulation.  6. Hypertension.   She is still doing rehab and physical therapy on her own.  She has not  started to drive yet but says she is getting close.  She is doing more  and more activity.  She still comes in a wheelchair.   MEDICATIONS:  Her medications are unchanged since her last visit.  There  is one exception; she is off of methocarbamol.   PHYSICAL EXAMINATION:  VITAL SIGNS:  Her blood pressure today is 131/87,  pulse is 90 and irregular.  Her weight is 193, up 5.  SKIN:  Skin color is good.  GENERAL:  Alert and oriented x3.  She is her usual humorous self.  HEENT:  Unchanged.  NECK:  Carotid upstrokes are equal bilaterally without bruits.  No JVD.  Thyroid is not enlarged.  Trachea is midline.  LUNGS:  Clear.  HEART:  A variable S1 and S2.  No gallop.  ABDOMEN:  Soft.  EXTREMITIES:  No cyanosis, clubbing or edema.  Pulses are intact.  NEUROLOGIC:  Intact.   ASSESSMENT/PLAN:  [name] is stable from our standpoint.  I have made no  change in her medical program.  I will see her back in 6 months.  At  some point, if her heart rate continues to increase with increasing  activity, we may need to start her back on metoprolol.     Thomas C. Daleen Squibb, MD, [facility]  Electronically Signed    TCW/MedQ  DD: 01/29/2008  DT: 01/29/2008  Job #: [id number]

## 2011-03-30 NOTE — H&P (Signed)
[name], [name]               ACCOUNT NO.:  [id number]   MEDICAL RECORD NO.:  [id number]          PATIENT TYPE:  AMB   LOCATION:  DAY                           FACILITY:  [facility]   PHYSICIAN:  Dalia Heading, M.D.  DATE OF BIRTH:  Feb 18, 1923   DATE OF ADMISSION:  DATE OF DISCHARGE:  LH                                HISTORY & PHYSICAL   CHIEF COMPLAINT:  Cholecystitis, cholelithiasis.   HISTORY OF PRESENT ILLNESS:  The patient is an 77 year old white female who  was referred for evaluation and treatment of biliary colic secondary to  cholelithiasis.  She has been having right upper quadrant abdominal pain  with radiation to the right flank, nausea, bloating for several weeks.  She  does have fatty food intolerance.  No fever, chills, jaundice have been  noted.   PAST MEDICAL HISTORY:  1.  History of atrial fibrillation which was cardioverted in December 2005.  2.  Hypertension.  3.  Arthritis.   PAST SURGICAL HISTORY:  1.  Right hip surgery.  2.  Right knee surgery.  3.  Back surgery in June 2006.   CURRENT MEDICATIONS:  1.  Lasix 40 mg p.o. daily.  2.  Coumadin 5 mg p.o. daily which she is holding.  3.  Amiodarone 200 mg p.o. daily.  4.  Allegra 180 mg p.o. daily.  5.  Lisinopril 20 mg p.o. daily.  6.  Skelaxin 800 mg p.o. daily.   ALLERGIES:  No known drug allergies.   REVIEW OF SYSTEMS:  The patient denies drinking or smoking.  She denies any  recent chest pain, MI, CVA or diabetes mellitus.   PHYSICAL EXAMINATION:  GENERAL APPEARANCE:  The patient is a white female  who uses a walker, in no acute distress.  HEENT:  No scleral icterus.  LUNGS:  Clear to auscultation with good breath sounds bilaterally.  HEART:  Regular rate and rhythm without S3, S4 or murmurs.  ABDOMEN:  Soft and nondistended.  She is tender in the right upper quadrant  to palpation.  No hepatosplenomegaly, masses or hernias are identified.   STUDIES:  Ultrasound of the gallbladder reveals  cholelithiasis with a  thickened gallbladder wall.   IMPRESSION:  Cholecystitis, cholelithiasis.   PLAN:  The patient was scheduled for laparoscopic cholecystectomy on  October 17, 2005.  The risks and benefits of the procedure including  bleeding, infection, hepatobiliary injury and the possibility of an open  procedure were fully explained to the patient.  Gained informed consent.  She is to remain off her Coumadin until surgery.  Preoperative clearance  appointment with Dr. Daleen Squibb is scheduled for October 15, 2005.      Dalia Heading, M.D.  Electronically Signed     MAJ/MEDQ  D:  10/11/2005  T:  10/11/2005  Job:  [id number]   cc:   Ramon Dredge L. Juanetta Gosling, M.D.  Fax: [phone]   Jesse Sans. Wall, M.D.  [address]  [address]  [place]  [place] [address]

## 2011-03-30 NOTE — Op Note (Signed)
NAMEYENG, FRANKIE               ACCOUNT NO.:  [id number]   MEDICAL RECORD NO.:  [id number]          PATIENT TYPE:  INP   LOCATION:  0004                         FACILITY:  [facility]   PHYSICIAN:  Marlowe Kays, M.D.  DATE OF BIRTH:  09/13/1923   DATE OF PROCEDURE:  05/31/2005  DATE OF DISCHARGE:                                 OPERATIVE REPORT   PREOPERATIVE DIAGNOSIS:  Severe right leg pain secondary to spinal central  and lateral recess stenosis L1 to the sacrum.   POSTOPERATIVE DIAGNOSIS:  Severe right leg pain secondary to spinal central  and lateral recess stenosis L1 to the sacrum.   OPERATION:  Central decompressive laminectomy L1-2 and slightly L2-3 and  lateral recess decompression right L2-3 to the sacrum.   SURGEON:  Marlowe Kays, M.D.   ASSISTANT:  Georges Lynch. Darrelyn Hillock, M.D.   ANESTHESIA:  General.   INDICATIONS FOR PROCEDURE:  She has total hip and total knee arthroplasties  on the right.  She was worked up to be sure that there is no abnormality  related to them because of the severe right leg pain.  An MRI has  demonstrated a little disk bulge at L4-5, but she has scoliosis and  suggestive spinal stenosis, and a myelogram was performed for this reason on  April 26, 2005, which showed prompt spinal stenosis at all levels from L1 to  the sacrum, in some cases central lateral and some cases a lateral recess  only such as at L5-S1.  Plan was to decompress her on the right side alone,  but because of the difficulty with decompression at L1-2 we ended up doing  central decompression there and extending down covering a portion of L3 as  well.   DESCRIPTION OF PROCEDURE:  Prophylactic antibiotics, satisfactory general  anesthesia.  Foley catheter inserted.  Prone position on the Wilson frame.  Back was prepped with DuraPrep and draped in a sterile field.  I  made a  central midline incision first and with two x-rays we were able to localize  the proximal and distal  extent of our incision.  Soft tissue was dissected  off the lamina from L1 to the sacrum and two self-retaining McCullough  retractors placed.  Working first from L5-S1 using a combination of double  action rongeur, bur and a 2 and 3 mm Kerrison rongeurs to remove bone and  ligament of flavum I then began working my way up the right lateral gutter.  When we got to L2, the going became very difficult with thick bone and the  dura could be exposed and we realized that we needed to shift to a central  laminectomy at L1-2.  Preserving some of the spinous process of L1, removing  the spinous process of L2 and working down to L3,  I removed the bone and  ligament of flavum working mainly central and to the right with slight  decompression to the left as well until the dura which we carefully  protecting was well decompressed.  Findings on the myelogram were confirmed  with significant extradural compression at L1-2 and at  L2-3 on the right  with bone and ligamentum flavum.  All of this was carefully removed.  Most  of this at this stage was done with the microscope and I completed the  decompression down to the sacrum with the microscope as well.  When  decompression was completed the foramina were all patent to hockey stick and  the spinal canal appeared top be well decompressed.  The wound was irrigated  well with sterile saline.  I placed Gelfoam beneath the proximal margin of  the central resection and over the dura and remainder of the resection.  I  followed this with a 1/4 inch Penrose drain  through the right posterior  flank. It was then closed in layers with interrupted #1 Vicryl in the  paralumbar and muscle and fascia and deep subcu tissue using 2-0 Vicryl  superficial subcutaneous tissue and staples in the skin.  Betadine and dry  sterile pressure dressing were applied.  She lost 1000 cc of blood during  the case and was in the process of getting 1 unit at the time of this  dictation  on her way to the recovery room.  There were no known  complications.       JA/MEDQ  D:  05/31/2005  T:  05/31/2005  Job:  [id number]

## 2011-03-30 NOTE — Op Note (Signed)
[name], [name]               ACCOUNT NO.:  [id number]   MEDICAL RECORD NO.:  [id number]          PATIENT TYPE:  OIB   LOCATION:  2899                         FACILITY:  [facility]   PHYSICIAN:  Jesse Sans. Wall, M.D.   DATE OF BIRTH:  Feb 14, 1923   DATE OF PROCEDURE:  DATE OF DISCHARGE:                                 OPERATIVE REPORT   [name] came to the outpatient area today for a DC cardioversion  for atrial fibrillation.  EKG showed a regular heart rhythm with very small  P-waves but I think this is sinus rhythm with a borderline first degree AV  block a little over 200 msec.   She has gained about four pounds over the last four or five days.  She is  not on Lasix.   Of note, she is also still taking Flecainide even though amiodarone was  initiated in the hospital.  Her amiodarone dose is at 400 three times daily.   Labs today are completely normal.   Vital signs are stable.   PLAN:  1.  Decrease amiodarone to 400 twice daily.  2.  Discontinue Flecainide.  3.  Add Lasix 40 mg by mouth every morning with 20 mEq of potassium.  4.  See me in the office in seven days.   A DC cardioversion was cancelled appropriately because she is in sinus  rhythm.      TCW/MEDQ  D:  01/29/2005  T:  01/29/2005  Job:  [id number]   cc:   Saul Fordyce, [facility].

## 2011-03-30 NOTE — Consult Note (Signed)
[name], [name]               ACCOUNT NO.:  [id number]   MEDICAL RECORD NO.:  [id number]          PATIENT TYPE:  INP   LOCATION:  1416                         FACILITY:  [facility]   PHYSICIAN:  Sandria Bales. Ezzard Standing, M.D.  DATE OF BIRTH:  29-Jan-1923   DATE OF CONSULTATION:  01/17/2006  DATE OF DISCHARGE:                                   CONSULTATION   REASON FOR CONSULTATION:  Malrotation of bowel.   HISTORY OF PRESENT ILLNESS:  This is an 77 year old white female who was  having some abdominal pain identified as having cholelithiasis and underwent  an uneventful cholecystectomy by Dr. Lovell Sheehan on 19 October 2005.  A  cholangiogram was not done.  She apparently did well for several weeks but  then developed sort of persistent nausea and vomiting, but she is also  having a bowel movement once a day.   She underwent hepatobiliary scan on 28 December 2005 which suggested a  possible bile leak.  However, on a CT scan don on 17 February, there was no  evidence of any free fluid or free air.  This was read as an unremarkable CT  scan.  Apparently the patient continued to have symptoms of nausea and  vomiting daily, was feeling increasing weakness although not necessarily  dehydrated.  The patient and family claim she has lost approximately 20  pounds since her gallbladder surgery in December.  She has otherwise been  relatively healthy.  She saw Dr. Wendall Papa in the office on 5 March.  Dr.  Christella Hartigan thought it would be best she be admitted to the hospital.   PAST MEDICAL HISTORY:  History of tachycardia-bradycardia syndrome followed  by Dr. Juanito Doom.  I think there has been some discussion about a pacemaker,  but she does not have immediate plans for that.   NEUROLOGIC: She denies history of seizure or loss of consciousness.   CARDIOVASCULAR: She has a history of tachycardia-bradycardia syndrome and  atrial fibrillation.  She also has a history of hypertension and  cardiomegaly.  She has  never had a heart catheterization.   PULMONARY: She has seasonal allergies but no history of pneumonia or  tuberculosis.   GASTROINTESTINAL:  She has had no known peptic ulcer disease, liver disease,  bowel disease.  Her only prior abdominal surgery was the laparoscopic  cholecystectomy in December 2006.  She has never had any children and never  had any female surgery.   MUSCULOSKELETAL:  She had a right knee replacement in 2005, a hip  replacement in 2003, and then she had back surgery, L1 and L2 central  decompression by Dr. Simonne Come in July 2006 which she has done pretty well  with.   Of note, she has had at least 2 cardioversions, once in March 2006 and once  in December 2005.   Her husband and, I think, stepdaughter were in the room when I examined and  talked with her.   PHYSICAL EXAMINATION:  GENERAL: She is a well-nourished, slightly overweight  white female who is alert, cooperative, and pleasant.  She says it has been  about  the last 2 days without any nausea or vomiting.  VITAL SIGNS: Temperature 97, pulse 54, respirations 18, blood pressure  218/100.  LUNGS: Clear to auscultation.  HEART: Regular rate and rhythm.  ABDOMEN:  Soft.  She has no tenderness and no guarding.  The laparoscopic  cholecystectomy incisions are well healed.  I feel no organomegaly.  She has  normal bowel sounds.   LABORATORY DATA:  Labs that I have show a white blood count of 7200,  hemoglobin 12, hematocrit 13, platelet count 259,000.  Her PT is 30.1, INR  2.9.  Her sodium is 138, potassium 4.2, chloride 107, CO2 29, glucose 109.  SGOT is mildly elevated at 60. SGPT mildly elevated at 88.  Her total  bilirubin is 0.6.  Her alkaline phosphatase is 91.  Amylase 47, lipase 18,  urinalysis negative.   I reviewed her CT scan and upper GI with Dr. Abelino Derrick and Dr. Irish Lack.  The CT scan was repeated on 5 March and shows what appears to be  abnormal location of her small bowel with  small bowel to the right of her  abdominal cavity, her right colon and cecum to the left of her abdominal  cavity. There is no obvious fluid around the liver.  She has kind of normal  post laparoscopic cholecystectomy changes.   Upper GI done today shows this malrotation with really no obvious duodenal  C-loop, but the duodenum goes almost straight out under the right lobe of  the liver with all her small bowel on the right side of the abdomen, the  right colon and cecum in the midline on the left side of the abdomen, her  transverse colon, left colon, and sigmoid colon appear to be in normal  location.  There is no evidence of obstruction of the small bowel, and the  contrast seems to go through the small bowel at a normal pace.  There is no  evidence of volvulus of the small bowel. There is no evidence of internal  hernia that I can see.   DIAGNOSES:  1.  Malrotation of the bowel in a lady who has had persistent nausea and      vomiting for a couple of months, but there is no obvious evidence that      the malrotation is the cause of her nausea and vomiting.  At this time I      will support, continue medication therapy.  I think if this episode      keeps repeating itself, then she needs further upper GI.  There may be a      reason for laparoscopy, but again there would be limited possibilities      of what you could do surgically to this bowel unless she had a clear      point of obstruction.  I discussed this with Dr. Marina Goodell.  2.  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  3.  She has a history of tachycardia-bradycardia syndrome with history of      atrial fibrillation with prior cardioversion by Dr. Daleen Squibb.  4.  Hypertension.  5.  History of arthritis with prior back surgery, hip and knee replacements.  6.  Anticoagulation for atrial fibrillation.      Sandria Bales. Ezzard Standing, M.D.  Electronically Signed     DHN/MEDQ  D:  01/17/2006  T:   01/17/2006  Job:  [id number]   cc:   Ramon Dredge  Patrice Paradise, M.D.  Fax: [phone]   Wilhemina Bonito. Marina Goodell, M.D. [facility]  [address]  [place]  [place] [address]   Jesse Sans. Wall, M.D.  [address]  [address]  [place]  [place] [address]   Dalia Heading, M.D.  Fax: [phone]   Marlowe Kays, M.D.  Fax: [phone]

## 2011-03-30 NOTE — Discharge Summary (Signed)
[place]  Patient:    [name]                       MRN: [id number] Adm. Date:  [id number] Disc. Date: 10/07/99 Attending:  Evern Core CC:         Laurier Nancy, M.D.             James P. Aplington, M.D.             Kari Baars, M.D.                           Discharge Summary  DISCHARGE DIAGNOSES: 1. Right total knee replacement 09/28/99. 2. Postoperative anemia. 3. Peptic ulcer disease. 4. Right total hip replacement in 1997.  HISTORY OF PRESENT ILLNESS:  This is a 77 year old white female admitted 11/16 ith a history of a right total hip replacement in 1997 who presented with long-standing right knee pain.  X-rays with advanced degenerative joint disease, no relief with conservative care.  Underwent a right total knee replacement 11/16 per Dr. Simonne Come.  Place on Coumadin for deep vein thrombosis prophylaxis and touchdown weight bearing, postoperative anemia, no transfusions, on pain management. Ambulating 40 feet with supervision, minimal assist to transfer, no chest pain r shortness of breath.  Latest chemistry showed an INR of 2.1. hemoglobin 10.4, EKG and chest x-ray were not listed.  Admitted for comprehensive ______ .  PAST MEDICAL HISTORY:  See discharge diagnoses.  PAST SURGICAL HISTORY: 1. Nasal surgery 2. Right total hip replacement in 1997.  ALLERGIES:  Flu vaccine.  PRIMARY PHYSICIAN:  Kari Baars, M.D.  MEDICATIONS PRIOR TO ADMISSION: 1. Celebrex daily. 2. Evista 60 mg daily. 3. Iron daily. 4. Multivitamin.  SOCIAL HISTORY:  No alcohol or tobacco.  Lives in ______ with husband.  One level home, four entry steps, independent with a cane and walker prior to admission.  Husband can provide assistance on discharge.  Family in area works.  HOSPITAL COURSE:  Patient did well.  ______ rehabilitation services with therapies initiated on a b.i.d. basis.  The following issues were  followed during patients ______ course.  Pertaining to [name] right total knee replacement, remained stable, surgical site healing nicely.  She will follow up with Dr. Simonne Come in one week for removal of staples.  She was touchdown weight-bearing with walker. She continued on Coumadin for deep vein thrombosis prophylaxis, venous doppler studies prior to her discharge were negative.  She will complete/complete Coumadin protocol for 30 more days to be followed by her primary doctor, Dr.Ed Juanetta Gosling.  A home health nurse will be provided.  Postoperative anemia was stable, she remained on her iron supplement, latest hemoglobin 9.8, hematocrit 30.4.  She had a history of peptic ulcer disease secondary to nonsteroidal anti-inflammatories.  She was placed on Pepcid daily.  Her right total hip replacement in 1997 was without issue. She had no bowel or bladder disturbances.  She was ambulating extended distances with a walker, essentially independent, standby assist in all areas of activity of daily living, dressing, grooming and homemaking.  She was encouraged through overall progress.  Discharged to home with recommendations of home health therapies.  Latest labs showed an INR of 1.8, hemoglobin 9.8, chemistries unremarkable.  DISCHARGE MEDICATIONS: 1. Coumadin 3 mg daily x 30 days until 11/05/99. 2. Evista 60 mg daily. 3. Celebrex 200 mg daily. 4. Pepcid 20 mg daily. 5.  Darvocet 1 tablet every 6 hours as needed for pain. 6. Iron supplement twice daily.  ACTIVITY:  Touchdown weight-bearing with walker.  DIET:  Regular.  WOUND CARE:  Call Dr. Simonne Come on Wednesday 11/29 for removal of staples.  No ______ while on Coumadin.  Home health nurse for prothrombin time on Tuesday 11/28, results to Dr. Shaune Pollack.  Follow up with Dr. Johna Roles ______ needed, Dr. Simonne Come, call for appointment. DD:  10/06/99 TD:  10/06/99 Job: [id number]

## 2011-03-30 NOTE — Discharge Summary (Signed)
[name], [name]               ACCOUNT NO.:  [id number]   MEDICAL RECORD NO.:  [id number]          PATIENT TYPE:  INP   LOCATION:  2018                         FACILITY:  [facility]   PHYSICIAN:  Pricilla Riffle, M.D.    DATE OF BIRTH:  August 20, 1923   DATE OF ADMISSION:  01/09/2005  DATE OF DISCHARGE:  01/12/2005                           DISCHARGE SUMMARY - REFERRING   PROCEDURE:  TEE - guided cardioversion January 11, 2005.   REASON FOR ADMISSION:  [name] is a 77 year old female, patient of Dr.  Juanito Doom with history of atrial fibrillation on chronic Coumadin, who  presented with symptomatic atrial fibrillation with associated dyspnea.  Please refer to dictated admission note for full details.   LAB AND X-RAY DATA:  Hematocrit 33 (MCV 98), platelets 285 on admission.  INR 3.3 on admission - 2.3 at discharge.  Potassium 4.1 on admission, 4.2 at  discharge.  Renal function remained normal.  Glucose 106 on admission.  BNP  352 on admission.   Admission chest x-ray:  Cardiomegaly; diffuse peribronchial thickening, no  definite edema/infiltrate.   HOSPITAL COURSE:  Patient was admitted for management of symptomatic atrial  fibrillation, with plans to proceed with TEE - guided cardioversion.   Medications were adjusted initially for clinical signs and symptoms  suggestive of mild congestive heart failure.  Additionally, ventricular  response was low 50s and Cardia was thus placed on hold by Dr. Dietrich Pates.  In addition, the patient was treated with IV Lasix initially, and following  clinical resolution, discontinued prior to discharge.   INR levels remained therapeutic (3.3 on admission - 2.3 at discharge) and  the patient underwent successful TEE - DC cardioversion on January 11, 2005.  However, it was noted that the patient's INR was not therapeutic on December 29, 2004.  She was thus maintained on IV heparin overlap during her brief  stay.   The patient successfully cardioverted,  by Dr. Dietrich Pates, to normal sinus  rhythm on the day prior to discharge.  She was maintaining NSR with first  degree atrioventricular block, then cleared for discharge by Dr. Tenny Craw.   Final recommendations at discharge per Dr. Daleen Squibb were to resume all previous  home medications.  Cardia will thus be added at the previous dose for  continued management of hypertension as well as rate control.   DISCHARGE MEDICATIONS:  1.  Flecainide 100 mg b.i.d.  2.  Metoprolol 50 mg b.i.d.  3.  Cardia 240 mg daily.  4.  Advair 250/50 mg as directed.  5.  Allegra 180 mg p.r.n.  6.  Coumadin 5/2.5 mg as directed.  7.  Skelaxin 400 mg as needed.   DISCHARGE INSTRUCTIONS:  1.  Follow-up at [facility] on Tuesday, January 16, 2005, at 10:15      a.m.  2.  Follow-up with Dr. Juanito Doom on Monday, January 22, 2005, at 11:15 a.m.   DISCHARGE DIAGNOSES:  1.  Symptomatic atrial fibrillation.      1.  Status post successful transesophageal echocardiogram - direct  current cardioversion January 11, 2005.  2.  Chronic  Coumadin.  3.  Mild congestive heart failure.  4.  History of normal left ventricular function.      GS/MEDQ  D:  01/12/2005  T:  01/12/2005  Job:  [id number]   cc:   Ramon Dredge L. Juanetta Gosling, M.D.  [address]  [place]  [place] [address]  Fax: [phone]

## 2011-03-30 NOTE — H&P (Signed)
NAMERUBEN, PYKA               ACCOUNT NO.:  [id number]   MEDICAL RECORD NO.:  [id number]          PATIENT TYPE:  INP   LOCATION:  2018                         FACILITY:  [facility]   PHYSICIAN:  Jesse Sans. Wall, M.D.   DATE OF BIRTH:  April 18, 1923   DATE OF ADMISSION:  01/09/2005  DATE OF DISCHARGE:                                HISTORY & PHYSICAL   PRIMARY CARE PHYSICIAN:  Dr. Juanetta Gosling in [place], [place].   CHIEF COMPLAINT:  The patient is here for cardioversion.  A very pleasant 77-  year-old Caucasian female followed by Dr. Daleen Squibb with a history of atrial  fibrillation with increased shortness of breath and dyspnea on exertion,  worsening over the last few weeks.  The patient was last seen by Dr. Daleen Squibb on  January 03, 2005 with complaints of significant fatigue and dyspnea on  exertion.  Her stress Myoview on December 06, 2004 showed an EF of 70% and  normal wall motion.  A 2-D echocardiogram has also been normal.  At that  time, her heart was irregular rate and rhythm in the 70s.  The patient had  trace of edema in the lower extremities.  She had failed direct current  cardioversion.  The patient was started on flecainide 100 mg p.o. b.i.d.  The patient has had no improvement with fatigue and dyspnea, but it was  decided to admit the patient to attempt a TEE with cardioversion again.  The  patient denies any chest pain.  Unaware of atrial fibrillation.   ALLERGIES:  No known drug allergies.   MEDICATIONS:  1.  Advair 250/50.  2.  Cartia 240.  3.  Allegra 180 p.r.n.  4.  Metoprolol 50 p.o. b.i.d.  5.  Warfarin 5 mg on Mondays, Wednesdays, Fridays, Saturdays, and Sundays;      2.5 on Tuesdays and Thursdays.  6.  Skelaxin 400 mg p.o. p.r.n.  7.  Flecainide 100 mg p.o. b.i.d.   PAST MEDICAL HISTORY:  1.  Positive for atrial fibrillation.  2.  Seasonal allergies.  3.  Anticoagulation therapy.  4.  Status post right knee and right hip surgery.  5.  Last adenosine  Cardiolite in January of 2006.   SOCIAL HISTORY:  The patient lives in [place] with her husband.  She is  a retired Engineer, civil (consulting).  Denies any ETOH, tobacco, drug, or herbal medication use.  She tries to follow a heart smart diet.  Exercise - she is active as  tolerated; however, activity has diminished greatly over the last few months  due to intolerance to atrial fibrillation.   FAMILY HISTORY:  Mother deceased at age 38 from TIAs and a CVA.  Father  deceased at age 19 from CHF.  She has a sister who also has had TIAs.   REVIEW OF SYSTEMS:  Positive for weight change.  The patient states her  weight has gone from 191 to 201 over the last month or so.  CARDIOPULMONARY:  Denies any chest pain.  Positive for shortness of breath, dyspnea on  exertion, edema in the lower extremities, cough, and occasional  wheezing.  MUSCULOSKELETAL:  Positive for pain, chronic with knees.  GI:  Positive for  vomiting x2 different episodes, and positive for generalized weakness and  fatigue.   PHYSICAL EXAMINATION:  VITAL SIGNS:  Pulse currently 52 and irregular.  GENERAL:  She is alert, oriented, pleasant, and cooperative.  In no acute  distress.  NECK:  Supple without lymphadenopathy.  Negative bruit, negative for JVD.  CARDIOVASCULAR:  Heart rate irregular S1 and S2.  LUNGS:  Clear to auscultation in upper lobes.  I hear fine crackles in her  bilateral bases.  ABDOMEN:  Soft, nontender.  Positive bowel sounds.  EXTREMITIES:  No clubbing or cyanosis.  +1 edema in the left ankle noted.  NEUROLOGIC:  Alert and oriented x3.  Cranial nerves II-XII grossly intact.   CHEST X-RAY:  Pending.   EKG:  Pending.   LABORATORY DATA:  Pending.   IMPRESSION:  1.  Atrial fibrillation, symptomatic.  2.  Anticoagulation therapy.  3.  Dyspnea secondary to #1.   PLAN:  The patient is being admitted.  Baseline blood work will be obtained  including CBC, PT, BNP, and BMET.  We will start the patient on a heparin  drip  and pharmacy to dose.  She will be made NPO after midnight for a TEE  with cardioversion in the a.m.  Will continue her medications from home,  including her flecainide, Skelaxin, Coumadin, metoprolol (hold if heart rate  less than 60), Allegra, Cartia, and Advair.      MB/MEDQ  D:  01/09/2005  T:  01/09/2005  Job:  [id number]

## 2011-03-30 NOTE — Discharge Summary (Signed)
[name], [name]               ACCOUNT NO.:  [id number]   MEDICAL RECORD NO.:  [id number]          PATIENT TYPE:  INP   LOCATION:  3708                         FACILITY:  [facility]   PHYSICIAN:  Rebecca L. Spaulding, M.D.DATE OF BIRTH:  1923-03-06   DATE OF ADMISSION:  07/19/2004  DATE OF DISCHARGE:  07/20/2004                                 DISCHARGE SUMMARY   DISCHARGE DIAGNOSES:  1.  New-onset atrial fibrillation.  2.  Shortness of breath.  3.  Seasonal allergies.  4.  Chronic right knee and hip pain.   MEDICATIONS:  1.  Warfarin 5 mg p.o. daily.  2.  Metoprolol XL, 50 mg p.o. daily.  3.  Skelaxin 800 mg p.o. t.i.d. p.r.n.  4.  Allegra one tablet p.o. daily.  5.  Mobic as directed by primary care physician.   FOLLOWUP APPOINTMENTS:  [facility], September 9, at 11:20  a.m. for PT/INR check.   HOSPITAL COURSE:  [name] is a 77 year old female who presented to the  emergency department complaining of a 4-day history of fatigue and shortness  of breath.  She started experiencing fatigue and shortness of breath not  associated with exertion.  She denied any chest pain, palpitation, nausea,  vomiting, arm pain or jaw pain.  She did report intermittent diaphoresis not  related to shortness of breath.  She went to see her primary care physician  at [facility] on the day of admission because of fatigue and shortness of  breath symptoms, and was found to be in atrial fibrillation diagnosed by  EKG.  The patient was sent directly to the emergency department.   Problem #1.  New onset atrial fibrillation.  The patient was admitted to  telemetry bed.  She was asymptomatic, during and throughout admission.  Her  vital signs remained stable.  Etiology of her atrial fibrillation was not  known at the time of admission.  It was thought to be possibly secondary to  medications or heart disease, possibly infection.  The patient was ruled out  for acute cardiac event  by cardiac enzymes which were within normal limits.  EKG did not suggest any acute events.  The patient was also scheduled for an  echocardiogram.  Due to the patient's increased heart rate at 122, we opted  to rate control her atrial fibrillation.  We placed her on metoprolol 25 mg  p.o. b.i.d.  The next morning, the patient's heart rate had decreased to 94.  She remained in atrial fibrillation overnight.  The patient was afebrile and  had a white count of 9.2.  Her atrial fibrillation was not felt to be due to  an acute infection.  A 2-D echo is pending and will need to be followed up  by her primary care physician.  The patient was discharged on Metoprolol and  Coumadin therapy.  She was in stable condition, and she will follow up with  her primary -care physician.   Problem #2.  Shortness of breath, likely secondary to her atrial  fibrillation.  The patient's oxygen saturation remained above 98%, and she  was eventually weaned off oxygen by the day of discharge.  Her white count  remained within normal limits throughout admission.  Chest x-ray was  negative for any acute changes related to an infection.  The patient was  discharged without oxygen in stable condition.   Problem #3.  Chronic right knee and hip pain.  The patient has a history of  right knee and hip replacement surgery.  She was maintained on her Skelaxin  and Mobic during admission.  She was discharged on these same medications,  and will follow up with her primary care physician   Problem #4.  Seasonal allergies.  At the time of admission, the patient's  allergy medication, Allegra, was held due to possible etiology of her atrial  fibrillation.  The patient was stable at discharge, and was sent home with  her medication of Allegra for seasonal allergy.       VRE/MEDQ  D:  08/03/2004  T:  08/05/2004  Job:  [id number]   cc:   [facility]

## 2011-03-30 NOTE — Op Note (Signed)
[name], [name]               ACCOUNT NO.:  [id number]   MEDICAL RECORD NO.:  [id number]          PATIENT TYPE:  INP   LOCATION:                               FACILITY:  [facility]   PHYSICIAN:  Pricilla Riffle, M.D.    DATE OF BIRTH:  [birth year]   DATE OF PROCEDURE:  01/11/2005  DATE OF DISCHARGE:                                 OPERATIVE REPORT   PROCEDURE PERFORMED:  Cardioversion with anesthesia assistance.   The patient was anesthetized with 200 mg Pentothal IV.  Pads were placed in  the AP position.  The patient cardioverted to sinus rhythm with 200 joules  biphasic synchronized energy.  The procedure was without complication.  A 12-  lead EKG is pending.       ___________________________________________  Pricilla Riffle, M.D.    PVR/MEDQ  D:  01/11/2005  T:  01/11/2005  Job:  [id number]

## 2011-03-30 NOTE — Assessment & Plan Note (Signed)
[facility]                              CARDIOLOGY OFFICE NOTE   NAME:[name], [name] ROEMMICH                      MRN:          [id number]  DATE:09/17/2006                            DOB:          11/28/1922    [name] comes in today for further management of her chronic atrial-  fibrillation, anticoagulation, tachy brady syndrome, chronotropic  incompetence and mild systolic congestive heart failure.   I saw her on June 21, 2006. At that time, we restarted her metoprolol at  12.5 b.i.d.   She is at home now. She is not very ambulatory, but has not had any falls.   Her medications are Claritin 10 mg a day, multivitamin daily, Coumadin as  directed with pro-time being checked today, lisinopril 40 mg a day, Lasix 20  mg a day, metoprolol 12.5 b.i.d.   She looks remarkably good. Blood pressure is 144/72, pulse is 60 and she is  AV paced. Weight is 195. There is no JVD, carotid upstrokes are equal  bilaterally without bruits.  LUNGS:  Are clear.  HEART: Reveals a regular rate and rhythm. There is no gallop.  ABDOMEN: Is soft.  EXTREMITIES: Reveals no cyanosis, clubbing or edema. Pulses are brisk.   ASSESSMENT/PLAN:  [name] is doing well. I have made no changes. I will plan  on seeing her back again in six months.     Thomas C. Daleen Squibb, MD, [facility]  Electronically Signed    TCW/MedQ  DD: 09/17/2006  DT: 09/17/2006  Job #: [id number]   cc:   Ramon Dredge L. Juanetta Gosling, M.D.

## 2011-03-30 NOTE — Assessment & Plan Note (Signed)
[facility]                           ELECTROPHYSIOLOGY OFFICE NOTE   NAME:BUSICKTamella, Tuccillo                      MRN:          [id number]  DATE:06/25/2006                            DOB:          04/20/1923    Ms. Sarwar is seen following pacemaker implantation for sinus node  dysfunction and chronotropic incompetence.  She is doing quite well.  The  device's pocket has healed nicely.   She saw Dr. Daleen Squibb just the other day, was doing well.  Medications reviewed  and are unchanged.   EXAMINATION:  VITAL SIGNS:  Her blood pressure was 136/74, the pulse was 67.  LUNGS:  Clear.  HEART:  Sounds were regular.  EXTREMITIES:  Without edema.   Interrogation of her [employer] Vitality pulse generator demonstrated P wave  that was only 0.4 with impedance of 561, a threshold of 0.75 at 0.4.  The R  wave was 12, impedance of 764, threshold of 0.5 at 0.5.  Device was  reprogrammed to maximize longevity.  __________ was extended because of  retrograde conduction.   IMPRESSION:  1. Sinus node dysfunction.  2. Status post pacer for the above.  3. Paroxysmal atrial fibrillation with episodes of most of which are      documented by the device.   Ms. Prest is stable for the device point of view.  Will plan to see her  again in about 9 months' time.                                   Duke Salvia, MD, [facility]   SCK/MedQ  DD:  06/25/2006  DT:  06/26/2006  Job #:  [id number]

## 2011-03-30 NOTE — Discharge Summary (Signed)
[name], [name]               ACCOUNT NO.:  [id number]   MEDICAL RECORD NO.:  [id number]          PATIENT TYPE:  INP   LOCATION:  A312                          FACILITY:  [facility]   PHYSICIAN:  Dalia Heading, M.D.  DATE OF BIRTH:  [birth year]   DATE OF ADMISSION:  10/17/2005  DATE OF DISCHARGE:  12/09/2006LH                                 DISCHARGE SUMMARY   HOSPITAL COURSE SUMMARY:  The patient is an 77 year old white female who  suffered from biliary colic secondary to cholelithiasis. She was going to  undergo a laparoscopic cholecystectomy on October 17, 2005 but was found to  be hypokalemic. She was admitted to the hospital for further evaluation and  treatment. She had been seen by Dr. Daleen Squibb preoperatively and was cleared from  a cardiac standpoint to undergo surgery. She subsequently underwent a  laparoscopic cholecystectomy on October 19, 2005. She tolerated the  procedure well. Her postoperative course was unremarkable. Her diet was  advanced without difficulty.   The patient was discharged home on October 20, 2005 in good improving  condition.   DISCHARGE INSTRUCTIONS:  The patient was to follow up with Dr. Franky Macho  on October 25, 2005.  Discharge medications included Darvocet-N 100 one  tablet p.o. q.4 h p.r.n. pain. She was to resume all her other medications  as previously prescribed.   PRINCIPAL DIAGNOSES:  1.  Cholecystitis, cholelithiasis.  2.  Hypokalemia, resolved.  3.  History of coronary artery disease.  4.  History of atrial fibrillation.  5.  Hypertension.  6.  Cardiomegaly.   PRINCIPAL PROCEDURE:  Laparoscopic cholecystectomy on October 19, 2005.      Dalia Heading, M.D.  Electronically Signed     MAJ/MEDQ  D:  11/17/2005  T:  11/17/2005  Job:  [id number]   cc:   Thomas C. Wall, M.D.  [address]  [address]  [place]  [place] [address]   Oneal Deputy. Juanetta Gosling, M.D.  Fax: [phone]

## 2011-03-30 NOTE — Discharge Summary (Signed)
[name], [name]               ACCOUNT NO.:  [id number]   MEDICAL RECORD NO.:  [id number]          PATIENT TYPE:  INP   LOCATION:  1416                         [facility]ACILITY:  [facility]   PHYSICIAN:  Rachael Fee, M.D. DATE OF BIRTH:  06-09-1923   DATE OF ADMISSION:  01/14/2006  DATE OF DISCHARGE:  01/25/2006                                 DISCHARGE SUMMARY   ADMISSION DIAGNOSES:  1.  An 77 year old white female with nausea and vomiting for two months,      etiology not clear with onset of symptoms.  Status post uneventful      laparoscopic cholecystectomy done in [facility] December 2006.  2.  Weight loss secondary to above.  3.  Progressive weakness and debilitation secondary to above with frequent      falls.  4.  History of atrial fibrillation on chronic Coumadin.  5.  Hypertension.  6.  Osteoarthritis.  7.  Status post right hip replacement and knee replacement.  8.  Laminectomy, July 2006.   DISCHARGE DIAGNOSES:  1.  Nausea and vomiting, resolved.  Several factors including malrotation of      the small bowel. Suspect she may have had a partial small bowel      obstruction which opened spontaneously. Cannot rule out a component of      hepatitis which may be a drug induced hepatitis.  2.  Weight loss secondary to above, stabilized  3.  Atrial fibrillation.  4.  Bradycardia in hospital, asymptomatic.  Felt secondary to tachybrady      syndrome, +/- amiodarone.  Amiodarone now discontinued.  5.  Urinary tract infection on treatment.  6.  Elevated liver function studies, felt medication related.  Several      hepatic markers pending at this time.  7.  Progressive debilitation, multifactorial as outlined above, preventing      safe discharge to home.   CONSULTATIONS:  Surgery.  Dr. Luan Pulling.  Cardiology.  Dr. Jens Som and Dr. Clearance Coots.   OPERATION/PROCEDURE:  1.  Upper GI and small bowel follow-through.  2.  CT scan of the abdomen and pelvis.   BRIEF HISTORY:  [name] is  a pleasant 77 year old white female with problems  as outlined above.  She developed cholecystitis and underwent what sounds  like an uneventful cholecystectomy per Dr. Lovell Sheehan on [place] on October 19, 2005.  Apparently did well initially after surgery but about 10 days  later started having problems with nausea and vomiting which had persisted  for over two months until seen by Dr. Christella Hartigan on the day of admission.  She  had been noted to have a minor elevation in her liver function studies and a  potassium of 3.3 in mid February.  Had undergone a HIDA scan which was  abnormal suggesting a leakage of the tracer. This was followed up by CT scan  two days later that did not show any evidence of perforation or myeloma.  At  the time of admission, she was vomiting at least a couple of times a day.  She reports that generally she would vomit about 3 p.m.  and then 3 a.m.  She  says it seems that the more she tried to eat, eventually would all come back  up.  She had subsequently lost about 20 pounds over the past month and this  had progressive weakness and difficulty ambulating.  She had not had any  fever or chills.  Generally says she was not actually vomiting up undigested  food but liquid. Bowel movements had been fairly normal without any evidence  of melena or hematochezia.  The patient had had some falls at home over the  past week due to weakness but no significant injury noted.  She was seen and  evaluated by Dr. Christella Hartigan as an new patient on the day of admission and  admitted to the hospital for further diagnostic evaluation, rehydration,  etc.   LABORATORY DATA:  On admission March 5, WBC 7.2, hemoglobin 12.2, hematocrit  37.8, MCV 96.  Followup on March 15, WBC 5.4, hemoglobin 12.3, hematocrit  37.5, platelets 214, sed rate of 39.  Protime on March 6 was 26.8, INR 2.5.  Followup on March 14, protime of 36.8, INR 3.7.  On admission, potassium  2.9, BUN 8, creatinine 0.8, albumin  2.6.  Followup on March 8, potassium  4.2, creatinine 0.7, albumin 2.3.  liver function studies on admission  showed a total bilirubin of 0.8, alk phos 98, ALT 109, AST 82. On March 15,  total bilirubin was 0.6, alk phos 105, ALT 227, AST 168.  CK negative.  Amylase and lipase normal.  TSH was 1.02.  Iron studies showed a serum iron  of 45, TIBC 284, saturation of 16, ferritin 243, hepatitis B surface antigen  negative.  Hepatitis C antibody negative.  Remainder of the markers pending  at the time of this dictation.  Urine culture on March 11 positive for  100,000 E coli, pansensitive.  Again the ANA, AAMI, and anti-smooth muscle  antibody pending.  X-ray studies:  Upper GI and small bowel follow-through  on March 8 shows evidence of intestinal malrotation anomaly, proximal small  bowel loops were dilated and somewhat paretic but there was no evidence of  obstruction.  The terminal ileum extends to the left and enters the cecum in  the left lower quadrant.  No evidence of volvulus or obstruction.  CT scan  of the abdomen and pelvis on March 5 shows post surgical changes in the  abdomen.  No acute abnormality though slightly distended loops of small  bowel in the right upper quadrant.   HOSPITAL COURSE:  The patient was admitted to the service of Dr. Rob Bunting. She was initially hydrated. Baseline labs were obtained. She was  scheduled for a CT scan of the abdomen and pelvis with findings as outlined  above.  There was no evidence of definite obstruction and she did seem to  have an anomaly of her small bowel with much of her small bowel located in  the right upper quadrant though there was no evidence of obstruction or  volvulus noted.  It was felt that she may have some sort of malrotation  likely chronic.  The evening of admission she was noted to be bradycardic.  Had been placed on telemetry and dipped as low as the 20's during the early morning hours at which point she was  asymptomatic and it was a sinus  bradycardia.  EKG was obtained and cardiology consultation was made.  Decision was made to hold her amiodarone. She remained stable. Pacemaker was  discussed.  It was felt that she probably has a tachybrady syndrome but  cardiology did not feel that she would require pacemaker during this  admission and wants something changed and arrangements were made for her to  see Dr. Daleen Squibb as an outpatient.  Off the amiodarone, her pulse did gradually  come back up and at the time of discharge was generally maintaining in the  60's and she had remained in a sinus rhythm.  She was also noted to have  elevated liver function studies.  CT scan had been negative.  Again this was  felt to possibly be due to the amiodarone though she did not really have any  improvement as she has off the amiodarone.  Hepatic markers have been sent  and some are pending at time of discharge.  Her nausea and vomiting resolved  fairly quickly after admission and she gradually advanced her diet which she  seemed to tolerate without difficulty.  She generally had no complaints of  abdominal pain.  She was found to have a urinary tract infection on March  11. This was treated with Cipro. By March 12, she was trying to get up and  be ambulated.  However, she is too weak to ambulate on her own and unable to  get herself out of the bed even with her walker.  We did have physical  therapy see her in consultation and the nurses worked with her regarding  strengthening as well.  However, by March 16, she was still unable to  ambulate safely even with her walker and felt to be high risk for return to  home to a chronically ill husband.  In the interim though she had no further  GI problems. Has been eating well without any difficulty for the past  several days.  She had one episode of small amount of emesis on March 15.  This did not recur and at this time it was felt that she was stable for  discharge.   We had attempted to get her in the [facility] at [facility]  which is now being closed and, therefore, nursing home placement for short-  term rehab is pursued.  Will hope for a 30-day or less rehab stay with  return to home after conditioning and physical therapy at a nursing  facility.   FOLLOW UP:  She will need followup with Dr. Valera Castle on April 4 at 4:30  p.m. at the [facility] office. She will need followup with Dr. Christella Hartigan in  our office.  This would be scheduled for three to four weeks out and she  will need to return to Dr. Juanetta Gosling for her internal medicine care.   DIET:  At the time of discharge is regular.   DISCHARGE MEDICATIONS:  1.  Claritin 10 mg daily.  2.  Coumadin 5 mg daily. We did hold this for the past two days due to a      slightly elevated INR, possibly since we have added Cipro.  3.  She is also on lisinopril 20 mg daily.  4.  Cipro 500 g b.i.d. for three more days.  5.  Phenergan 25 mg q.6h. as needed.  I will schedule her a followup appointment in our office and add an addendum  to this summary.      Mike Gip, P.A.-C. [facility]    ______________________________  Rachael Fee, M.D.    AE/MEDQ  D:  01/25/2006  T:  01/25/2006  Job:  [id number]

## 2011-03-30 NOTE — Op Note (Signed)
[place]  Patient:    [name]                       MRN: [id number] Proc. Date: 09/28/99 Adm. Date:  [id number] Attending:  Marlowe Kays Page                           Operative Report  PREOPERATIVE DIAGNOSIS:  Degenerative arthritis of right knee.  POSTOPERATIVE DIAGNOSIS:  Degenerative arthritis of right knee.  OPERATION:  Osteonics total knee replacement, right.  SURGEON:  Illene Labrador. Aplington, M.D.  ASSISTANT:  Philips J. Montez Morita, M.D.  ANESTHESIA:  General.  INDICATIONS FOR PROCEDURE:  She had bone-on-bone abutment in the lateral compartment of the knee joint on standing x-ray with considerable pain.  DESCRIPTION OF PROCEDURE:  Prophylactic antibiotics and satisfactory general anesthesia, and Foley catheter was inserted.  Care was taken to protect the right total hip.  Pneumatic tourniquet applied.  A Stulberg foot stabilizer.  The right knee from the tourniquet to the ankle was prepped with DuraPrep and draped as a  sterile field.  Ioban employed.  Vertical midline incision down to the patellar  mechanism with a medial parapatellar incision to open the joint.  Osteophytes around the femur and patella were removed.  The patellar mechanism was freed up, the patella everted, and the knee flexed.  The anterior portions of both menisci were detached, and the ACL excised.  I then placed a 5/16 inch drill hole in the distal femur followed by the canal finder which was gently introduced because of the total hip replacement.  I then placed the axis liner, set at 5 degrees for he right knee and a 10 mm cut off the distal femur.  I incised the distal femur for the femoral component with the cradle guide and a #7 seemed to be the best fit.  The described holes were placed and the distal femoral cutting jig placed with anterior and posterior cuts, and posterior and anterior chamfering made.  I then went to the tibial.  A  leveling cut was made and the remnants of the menisci were removed.  Because of the valgus deformity and tightness laterally, I released a portion of the lateral collateral ligament and the popliteus from the femur. I then sized the tibia with a #7 base plate and intermedullary drill hole followed by a step cut drill were made, and inserted the intermedullary pole with the external cutting guide, and external rods splitting the bimalleolar distance. Initially, I set the stylus for cutting 4 mm off from the depressed lateral tibial plateau. A 5 degree posterior cut was made and will return to the femur.  Will replace the femoral jig for creating the patellar groove which was made, and then placed the trial femoral component which fit nicely. I then found that I did not remove the bone from the tibia, and I went back and removed an additional 4 mm, and following this, a 7 mm spacer fit nicely.  At this point, I sized the patella at a 26, and we used the 10 mm recessed cutting jig to make a 10 mm recess cut.  Then inserted the guide for making the three fixation holes which were made.  The trial prosthesis was then placed in the recess, and bone was trimmed up around the parameter with the shaver and small rongeurs.  We then returned to the tibia, and  once again checked the sizing at 7, and with the external guide, split the bimalleolar distance, marked the best position for the tibial tray, which was also _______there on flexion and extension.  The tibial tray was then secured with pins, kind of with a tripod apparatus placed and the  keel reamers used up to a 7 cemented.  I then waterpicked the knee while the methyl methacrylate was being mixed.  We applied the methyl methacrylate first to the tibia, placed in the tibial tray which was impacted with excess methyl methacrylate being removed.  I then placed the methacrylate on the femur and impacted the femoral component, again  removing excess amounts of methacrylate and using the  8 spacer temporarily to hold the knee in extension while we glued in the patellar components, holding it with a patellar clamp.  Once the methyl methacrylate had  hardened, I removed excess tidbits of it from around the component, and then jumped to a 12 mm pacer which seemed to be ideal with full extension and no instability.  Consequently, we irrigated the wound well and once again checked for particular  pieces of methyl methacrylate and placed the final 12 mm spacer, reducing the knee and finding it to have excellent motion with no instability.  I then checked be  sure that no lateral release was required on the patellar mechanism and none was. A Hemovac was inserted.  We then closed the wound with interrupted #1 Vicryl in two layers and quadriceps, and two layers distally with one in the synovium and one in the capsule.  The subcutaneous tissue was closed with a combination of #1 and  2-0 Vicryl, and the skin with staples.  Betadine Adaptic dry sterile dressing were applied.  The tourniquet was released with 102 minutes of tourniquet time having elapsed.  She tolerated the procedure well and was taken to the recovery room in satisfactory condition with no complications.  Estimated blood loss was none.  Blood replacement none. DD:  09/28/99 TD:  10/01/99 Job: [id number]

## 2011-03-30 NOTE — Discharge Summary (Signed)
[name], [place]               ACCOUNT NO.:  [id number]   MEDICAL RECORD NO.:  [id number]          PATIENT TYPE:  INP   LOCATION:  1506                         FACILITY:  [facility]   PHYSICIAN:  Marlowe Kays, M.D.  DATE OF BIRTH:  [birth year]   DATE OF ADMISSION:  05/31/2005  DATE OF DISCHARGE:  06/06/2005                                 DISCHARGE SUMMARY   ADMITTING DIAGNOSES:  1.  Spinal stenosis L1-L2 to the sacrum.  2.  Atrial fibrillation.  3.  History of congestive failure.  4.  Hypertension.   DISCHARGE DIAGNOSIS:  1.  Spinal stenosis L1-L2 to the sacrum.  2.  Atrial fibrillation.  3.  History of congestive failure.  4.  Hypertension.  5.  Mild postoperative anemia.   OPERATION:  On May 31, 2005, the patient underwent central decompressive  lumbar laminectomy L1 and 2, and slightly L2-L3 with lateral recess  decompression from L2-3 to the sacrum.  Dr. Ranee Gosselin assisted.   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  She has had several emergency room visits due to her  pain and discomfort.  Back x-rays have shown osteoarthritis, L4-L5 with  scoliosis and concavity to the right.  Narrowing was seen from the L2 to L4  disk spaces.  MRI further illustrated multilevel disk problems.  After much  discussion including the risks and benefits of the surgery, it was decided  the above procedure would be indicated, and the patient was admitted for  same.   COURSE IN THE HOSPITAL:  The patient tolerated the surgical procedure quite  well.  Early on, it was thought that she might be very slow with her  physical therapy.  She did work diligently with physical therapy and  occupational therapy, and they encouraged mobility.  It was felt she might  benefit from intensive inpatient rehabilitation program.  However, she  progressed so well, her  desire was to go home with home health.  [facility] was designated for her home unit by the patient, and arrangements  were made for that discharge home with plan altered from inpatient  rehabilitation to home health.  The patient was very pleased with this,  remained alert and oriented throughout her hospitalization; wound remained  dry.   She had a marked reduction in her lower leg discomfort postoperatively.  Back precautions were given.  On the day of discharge, she was afebrile.  Vital signs were stable.  She had no nausea and vomiting, motor intact to  the lower extremity.  Laboratory values in the hospital hematologically  showed a preoperative CBC which was completely within normal limits.  Final  hemoglobin was 10.7.  Blood chemistries were normal.  Urinalysis negative  for urinary tract infection.  Electrocardiogram showed a junctional rhythm  on one occurrence and first-degree A-V block on another current.  No chest x-  ray seen on this chart.   CONDITION ON DISCHARGE:  Improved, stable.   PLAN:  The patient discharged to her home in the care of her family.  She is  given a prescription for Percocet for pain, Robaxin as a muscle relaxant,  and resume her Coumadin which she had been on preoperatively at home under  the direction of her family physician, Dr. Kari Baars, in [place].  We would appreciate her following up with Dr. Juanetta Gosling concerning any medical  problems.  She will return to see Dr. Sedonia Small 2 weeks after the date of  surgery, use back precautions.      Druscilla Brownie   DLU/MEDQ  D:  06/14/2005  T:  06/14/2005  Job:  [id number]   cc:   Ramon Dredge L. Juanetta Gosling, M.D.  [address]  [place]  [place] [address]  Fax: [phone]

## 2011-03-30 NOTE — H&P (Signed)
[name], [name]               ACCOUNT NO.:  [id number]   MEDICAL RECORD NO.:  [id number]          PATIENT TYPE:   LOCATION:                                 FACILITY:   PHYSICIAN:  Marlowe Kays, M.D.  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 Simonne Come for continued and progressive problems  concerning pain of the lumbar spine with radiation into the right lower  extremity.  She has had to have ER visits due to this pain and discomfort.  She has had a total hip replacement arthroplasty in the past, and has  attributed some of her discomfort to that; however, X-rays have show no  evidence of any dislocation in the past.  Dr. Simonne Come has reviewed the  films taken in the emergency room, and he sees no abnormalities of either  the hip or the knee.  It was noted that she had some osteoarthritis of the  L4-5, and lumbar scoliosis with concavity was seen to the right, and marked  narrowing of L2-L3 and L3-L4 disk spaces.  Lumbar MRI was performed on Apr 03, 2005, and multi-level disk problems were seen, but most significant at  L1-L2 and L3-L4 on the right.  Abnormalities were also seen at the L4-L5 and  L5-S1 level.  The patient is on chronic Coumadin for a history of atrial  fibrillation, and she came off that prior to this surgery.   After much discussion, including the risks and benefits of surgery, it is  felt this patient would benefit from surgical intervention, and is to  undergo a right decompressive laminectomy from L1-L2 to the sacrum.   PAST MEDICAL HISTORY:   ALLERGIES:  The patient has no medical allergies.   PHYSICIAN:  Dr. Kari Baars in [place], [place].   CURRENT MEDICATIONS:  1.  Warfarin 5 mg one daily  (has stopped this).  2.  Metoprolol 25 mg one daily.  3.  Lasix 40 mg one daily.  4.  Amiodarone hydrochloride 200 mg daily.  5.  __________ 180 mg one daily.  6.  Skelaxin 800 mg q.4h.  7.  Potassium 20 mEq daily.   MEDICAL ILLNESSES:  1.  The patient had bronchitis in August of 2005.  2.  Has reported congestive heart failure.  3.  Atrial fibrillation.   FAMILY HISTORY:  Noncontributory.   SOCIAL HISTORY:  Noncontributory.   REVIEW OF SYSTEMS:  CNS:  No seizures, shoulder paralysis, numbness, or  double vision.  RESPIRATORY:  No productive cough.  She has some shortness  of breath with extreme activity, and if she rushes about.  No hemoptysis.  GASTROINTESTINAL:  No nausea, vomiting, melena, or bloody stools.  GENITOURINARY:  No discharge, dysuria, hematuria.  MUSCULOSKELETAL:  Primarily in the present illness.   PHYSICAL EXAMINATION:  GENERAL:  Alert, cooperative, friendly, 77 year old  black female who is accompanied by her daughter.  VITAL SIGNS:  Blood pressure 158/80, pulse 88, respirations 12.  HEENT:  Normocephalic.  Pupils equal, round and reactive to light and  accommodation.  Oropharynx was clear.  CHEST:  Clear to auscultation.  No rhonchi, no rales.  HEART:  Regular rate and rhythm.  No murmurs are heard.  ABDOMEN:  Soft, nontender.  Liver and spleen not felt.  GENITALIA/RECTAL:  Not done.  No pertinent to present illness.  EXTREMITIES:  Negative straight leg raise bilaterally.  Motor and strength  are grossly intact.   ADMITTING DIAGNOSES:  1.  Spinal stenosis at L1-L2 to the sacrum.  2.  Atrial fibrillation.  3.  History of congestive heart failure.  4.  Hypertension.   PLAN:  The patient will be admitted for right decompressive lumbar  laminectomy from L1-L2 to the sacrum.  Should we have any medical problems,  we will certainly contact one of the hospitalists to follow along with [place]  during this patient's hospitalization.      Druscilla Brownie   DLU/MEDQ   D:  05/24/2005  T:  05/24/2005  Job:  [id number]   cc:   Ramon Dredge L. Juanetta Gosling, M.D.  [address]  [place]  [place] [address]  Fax: [phone]

## 2011-03-30 NOTE — Discharge Summary (Signed)
[name], [name]               ACCOUNT NO.:  [id number]   MEDICAL RECORD NO.:  [id number]          PATIENT TYPE:  INP   LOCATION:  4737                         FACILITY:  [facility]   PHYSICIAN:  Maisie Fus C. Wall, M.D.   DATE OF BIRTH:  06/30/1923   DATE OF ADMISSION:  01/16/2005  DATE OF DISCHARGE:  01/21/2005                                 DISCHARGE SUMMARY   PROCEDURE:  None.   HOSPITAL COURSE:  [name] is a 77 year old female with a history of  paroxysmal atrial fibrillation.  She had TEE, cardioversion on January 11, 2005  and was discharged on January 12, 2005.  She came back to the hospital on January 16, 2005 after being seen in the Coumadin clinic.  Her INR was therapeutic  but she was short of breath and symptomatic and was found to be in atrial  fibrillation.  She was admitted for further evaluation.   [name] had been on Cardia XT 240 a day, metoprolol 50 mg b.i.d.,  flecainide 100 mg b.i.d. since discharge.  She had some episodes of  bradycardia with a heart rate in the 40's.  It was felt that the flecainide  was unsuccessful and this was discontinued.  The Cardia XT was discontinued  as well.  Lopressor was decreased from 50 mg b.i.d. to 25 mg b.i.d.  She had  amiodarone added to her medication regimen.   Off the Cardia XT and with the decreased dose of beta blocker, her heart  rate did not sustain below 50 and she had no significant pauses.  Her INR  was therapeutic on a slightly decreased dose of Coumadin because of the  amiodarone.  She continued to have dyspnea on exertion but in general felt  her that shortness of breath was at baseline.  By January 21, 2005 she had  been on amiodarone for five days.  She was tolerating it well.  She is to  continue amiodarone at 400 mg t.i.d. for another week and then decrease to  400 b.i.d.  She is to follow up with Dr. Daleen Squibb and get an outpatient direct  current cardioversion.  [name] was considered stable for discharge on  January 21, 2005.   DISCHARGE INSTRUCTIONS:  1.  Her activity is to be as tolerated.  2.  She is to stick to a low fat diet.  3.  She is to get a Coumadin check next week.  4.  She is to follow up with Dr. Daleen Squibb and get a cardioversion scheduled.  5.  She is to follow up with Dr. Juanetta Gosling as needed.   DISCHARGE MEDICATIONS:  1.  Cardia XT and flecainide are discontinued.  2.  Amiodarone 200 mg, two tablets t.i.d. for one week then two tabs b.i.d.  3.  Metoprolol 25 mg b.i.d.  4.  Advair 250/50 as prior to admission.  5.  Allegra 180 mg daily PRN.  6.  Skelaxin 400 mg daily.  7.  Coumadin 5 mg tablets, take one-half tablet daily, except take 5 mg      today.  RB/MEDQ  D:  01/21/2005  T:  01/22/2005  Job:  [id number]   cc:   Ramon Dredge L. Juanetta Gosling, M.D.  [address]  [place]  [place] [address]  Fax: [phone]

## 2011-03-30 NOTE — Discharge Summary (Signed)
[name], [name]               ACCOUNT NO.:  [id number]   MEDICAL RECORD NO.:  [id number]          PATIENT TYPE:  OIB   LOCATION:  4737                         FACILITY:  [facility]   PHYSICIAN:  Doylene Canning. Ladona Ridgel, M.D.  DATE OF BIRTH:  10-26-1923   DATE OF ADMISSION:  03/29/2006  DATE OF DISCHARGE:  03/30/2006                                 DISCHARGE SUMMARY   CARDIOLOGIST:  Dr. Graciela Husbands   DISCHARGING DIAGNOSIS:  Status post pacemaker placement.   PROCEDURES:  Procedures performed during this hospitalization include a  pacemaker implant on Mar 29, 2006.   HISTORY OF PRESENT ILLNESS AND HOSPITAL COURSE:  The patient is an 77-year-  old female with a history of symptomatic atrial fibrillation with dyspnea,  fatigue and weakness that was admitted on Mar 29, 2006 for planned pacemaker  implant.  On Mar 29, 2006, Dr. Graciela Husbands implanted a pacemaker for slow  fibrillation with symptomatic dyspnea on exertion and weakness.  The patient  had previously failed flecainide.  SHE HAS NO KNOWN ALLERGIES.  The patient  tolerated pacemaker implant well and was seen on Mar 30, 2006 by Dr. Ladona Ridgel.  The patient will be discharged home.  She was discharged with the following  medications:  Lisinopril 20 mg daily, Claritin 10 mg daily, multivitamin  daily, Coumadin 2.5 daily except 5 mg on Wednesday.  She is to see Dr. Graciela Husbands  on June 25, 2006 at 2:50 p.m. and she is to have a followup appointment at  the pacer clinic on April 17, 2006 at 9:20 a.m.  She is not to drive for a  week, no lifting for 4 weeks, nothing heavier than 10 pounds and she is also  to keep her incision dry for the next 7 days and sponge bathe until Friday,  Apr 05, 2006.     ______________________________  April Humphrey, NP    ______________________________  Doylene Canning. Ladona Ridgel, M.D.    AH/MEDQ  D:  03/30/2006  T:  03/30/2006  Job:  [id number]   cc:   Duke Salvia, M.D.  [address]  [address]  [place]  [place] [address]

## 2011-03-30 NOTE — Op Note (Signed)
NAMEKIRRA, VERGA               ACCOUNT NO.:  [id number]   MEDICAL RECORD NO.:  [id number]          PATIENT TYPE:  OIB   LOCATION:  2899                         FACILITY:  [facility]   PHYSICIAN:  Jesse Sans. Wall, M.D.   DATE OF BIRTH:  July 29, 1923   DATE OF PROCEDURE:  10/13/2004  DATE OF DISCHARGE:  10/13/2004                                 OPERATIVE REPORT   PROCEDURE:  Outpatient cardioversion.   INDICATIONS:  Atrial fibrillation.   Consent obtained.   Anesthesia per Dr. Katrinka Blazing.   Preprocedure labs within normal limits with an INR of 4.1.   Biphasic current 100 joules administered.  The patient converted to sinus  rhythm, then back to atrial fibrillation.  One hundred fifty joules  administered, and now she is in sinus rhythm with PACs.   There were no complications.   1.  Continue current medication.  2.  Hold Coumadin tonight and have a protime checked early next week.  She      is on 2.5 mg daily of Coumadin.  3.  I will follow up with her in two weeks in the office.      Scharlene Corn   TCW/MEDQ  D:  10/13/2004  T:  10/14/2004  Job:  [id number]

## 2011-03-30 NOTE — Op Note (Signed)
[name], [name]               ACCOUNT NO.:  [id number]   MEDICAL RECORD NO.:  [id number]          PATIENT TYPE:  OIB   LOCATION:  4729                         FACILITY:  [facility]   PHYSICIAN:  Duke Salvia, M.D.  DATE OF BIRTH:  1922-11-23   DATE OF PROCEDURE:  03/29/2006  DATE OF DISCHARGE:                                 OPERATIVE REPORT   PREOPERATIVE DIAGNOSIS:  1.  Sinus node dysfunction.  2.  Chronotropic incompetence.   POSTOPERATIVE DIAGNOSIS:  1.  Sinus node dysfunction.  2.  Chronotropic incompetence.   PROCEDURE:  Dual chamber pacemaker implantation.   Following obtaining informed consent, the patient was brought to the  electrophysiology laboratory and placed on the fluoroscopic table in the  supine position.  After routine prepping and draping of the left upper  chest, lidocaine was infiltrated in the prepectoral subclavicular region.  An incision was made and carried down to the layer of the prepectoral fascia  using electrocautery and sharp dissection.  A pocket was formed similarly,  hemostasis was obtained.   Thereafter, attention was turned to gain access to the extrathoracic left  subclavian vein which was accomplished without difficulty without the  aspiration of air or puncturing the artery.  Two separate venipunctures were  accomplished, the guide wires were placed and retained and a #0 silk suture  was placed in a figure-of-eight fashion, allowed to hang loosely.   Sequentially, 7 French tear-away introducer sheaths were placed which were  then passed sequentially.  A St. Jude 1646T passive fixation ventricular  lead, Serial [id number] and a St. Jude 1642T passive fixation atrial lead  serial [id number].  Under fluoroscopic guidance they were manipulated into  the right ventricular apex and the right atrial appendage respectively,  where the bipolar R-wave was 13.8 with an impedance of 822, a threshold of  0.8, 0.5 volts and 0.5 msec, current  threshold of 0.6 mA and there is no  diaphragmatic pacing at 10 volts.  The bipolar P-wave, after multiple  manipulations, was 1.1 mV, the pacing impedance was 468 ohms and threshold  was 1.5 v at 0.5 msec, current threshold was 4.0 mA.   At this point, the leads were secured to the prepectoral fascia and then  attached to a St. Jude Victory XL DR pulse generator, Model [id number], serial  [id number].  Ventricular pacing and atrial pacing were identified.  The pocket  was copiously irrigated with antibiotic containing saline solution,  hemostasis was assured and the leads in the pulse generator were placed in  the pocket, secured to the prepectoral fascia.  The wound was closed in 3  layers in the  normal fashion.  The wound was washed, dried and a benzoin and Steri-Strips  dressing was applied.  Needle count, sponge counts and instrument counts  were correct at the end of the procedure, according to the staff.  The  patient tolerated the procedure without apparent complication.           ______________________________  Duke Salvia, M.D.     SCK/MEDQ  D:  03/29/2006  T:  03/29/2006  Job:  [id number]   cc:   Thomas C. Wall, M.D.  [address]  [address]  [place]  [place] [address]   Electrophysiology Laboratory   [facility]

## 2011-03-30 NOTE — H&P (Signed)
NAMEALEXEE, [name]                           ACCOUNT NO.:  [id number]   MEDICAL RECORD NO.:  [id number]                   PATIENT TYPE:  INP   LOCATION:  3708                                 FACILITY:  [facility]   PHYSICIAN:  Rebecca L. Jolinda Croak, M.D.          DATE OF BIRTH:  October 11, 1923   DATE OF ADMISSION:  07/19/2004  DATE OF DISCHARGE:                                HISTORY & PHYSICAL   HISTORY OF PRESENT ILLNESS:  [name] is a 77 year old white female  reporting to the ED complaining of a 4-day history of fatigue and shortness  of breath.  Four days ago, the patient had increased urine output and  frequency.  The next day, the patient started experiencing fatigue and  shortness of breath not associated with exertion.  The patient denies chest  pain, palpitations, nausea, vomiting, arm pain and jaw pain.  The patient  does report diaphoresis intermittently, not related to shortness of breath.  The patient went to see primary care doctor at [facility] today because  of fatigue and shortness of breath symptoms and was found to be in atrial  fibrillation, diagnosed by EKG, and the patient was sent directly to the  emergency department.   PAST MEDICAL HISTORY:  1.  Right hip replacement 5 years ago by Dr. Fayrene Fearing [place].  2.  Right knee replacement 3 years ago.  3.  Seasonal allergies.   MEDICATIONS:  Mobic, Celexa, Allegra.   ALLERGIES:  No known drug allergies.   FAMILY HISTORY:  Mother deceased -- history of TIAs, CVA, died at age 71.  Father deceased -- congestive heart failure, prostate cancer, died at age  41.  Sister -- TIAs.  Older sister deceased, died of a fall.   SOCIAL HISTORY:  The patient is a retired Engineer, civil (consulting).  She is married, never  smoked.  No alcohol use.  She denies illicit drugs.  She drinks about 2 cups  of coffee per day.  The patient describes herself as active.   REVIEW OF SYSTEMS:  The patient complains of fatigue, shortness of breath,  urinary  frequency for 1 day, warmth and diaphoresis.  Negative:  Denies  chest pain, denies palpitations, denies PND or orthopnea, denies lower  extremity edema, denies GI complaints.   PHYSICAL EXAM:  VITAL SIGNS:  Temperature 97, heart rate 121, respiration  rate 20, BP 141/75, 97% on room air.  GENERAL:  Generally alert and oriented, no apparent distress, very pleasant  and talkative.  HEENT:  PERRL.  EOMI.  NECK:  No thyromegaly.  No cervical lymphadenopathy.  CV:  Irregular rhythm.  No murmurs, gallops or rubs.  No JVD.  Positive  femoral, radial and pedal pulses.  No bruits.  LUNGS:  Lungs clear to auscultation bilaterally.  No flared nares, no  retracted breathing, no increased work of breathing.  ABDOMEN:  Soft, nontender and nondistended.  Obese.  Positive bowel sounds.  EXTREMITIES:  No lower extremity edema.  MUSCULOSKELETAL:  Muscle strength 5/5, reflexes 2+, positive sensation.  NEUROLOGIC:  Cranial nerves II-XII grossly intact.   LABORATORIES:  CBC:  WBC 9.2, hemoglobin 14.1, hematocrit 41.2, platelets  306,000, MCV 100.  Sodium 141, potassium 3.7, chloride 104, bicarb 28.6, BUN  26, creatinine 0.8, glucose 124.  MB 45.5, CK-MB less than 1.0, troponin  less than 0.05.  PT 13.7, INR 1.1, PTT 29.  Total CO2 30, acid base excess  2.0, pH 7.3.  Hemoccult negative.   Chest x-ray:  No acute changes; questionable chronic pulmonary disease,  lower lung bases.   ASSESSMENT AND PLAN:  [age]-year-old white female with shortness of  breath and new-onset atrial fibrillation.   1.  Shortness of breath, likely due to atrial fibrillation.  Patient's      oxygen saturations are okay.  She is afebrile and white blood count is      within normal limits, so not suggestive of any acute infection; chest x-      ray is also negative for acute changes related to an infection.  We will      continue to support the patient with oxygen via nasal cannula if needed.  2.  Atrial fibrillation, new  onset.  The patient's vitals are stable,      patient subjectively asymptomatic at present.  At this point, I am not      sure of the etiology of her atrial fibrillation; it could be possibly      due to medications, holiday stress, heart disease.  At this time, I do      not feel atrial fibrillation is related to heart disease.  The patient's      cardiac enzymes set #1 are within normal limits.  BNP is also within      normal limits, in addition to no cardiomegaly or acute cardiac changes      seen on chest x-ray, however, we will repeat her cardiac enzymes in 8      hours and repeat EKG in the morning, in addition to ordering a 2-D      echocardiogram to rule out cardiac etiology of her atrial fibrillation.      I also doubt her atrial fibrillation is due to an infectious etiology      due to her white blood count being within normal limits and no report of      any fevers; also, chest x-ray was negative for any infectious process.      I will check her TSH to rule out hyperthyroidism as the etiology.  We      will admit the patient and rate-control her atrial fibrillation with      metoprolol.  3.  Chronic right knee and hip pain.  We will treat the patient with home      medications on an as-needed basis.  4.  Seasonal allergies.  At this time, we will withhold allergy medication,      Allegra, for now due to a possible etiology of her atrial fibrillation.      Morley Kos, M.D.         Randon Goldsmith. Jolinda Croak, M.D.    VE/MEDQ  D:  07/20/2004  T:  07/20/2004  Job:  [id number]

## 2011-03-30 NOTE — Assessment & Plan Note (Signed)
[facility]                              CARDIOLOGY OFFICE NOTE   NAME:[name], [name] HOPPE                      MRN:          [id number]  DATE:06/21/2006                            DOB:          12/19/1922    [name] returns today for further management of her chronic atrial fib,  anticoagulation, tachybrady syndrome, chronotropic incompetence.  She is  status post St. Jude pacer by Dr. Graciela Husbands on Mar 29, 2006.  She has relatively  good LV function with an EF of 50% by echo March, 2006.  She has  hypertension.   She is slowly recuperating through rehab after right hip replacement and  right total knee replacement and back surgery in July, 2006.  She has had a  whole host of issues.   She looks the best I have seen her in months.   Her meds are unchanged since her last visit.   PHYSICAL EXAMINATION:  VITALS:  Her blood pressure today is 156/72, her  pulse is 76 and irregular.  Her weight is 185, up 7.  SKIN:  Warm and dry and pink.  NECK:  Carotid upstrokes are equal bilaterally without bruits.  No JVD.  LUNGS:  Reveal bibasilar crackles which are baseline.  HEART:  Reveals an irregular rate and rhythm.  ABDOMEN:  Soft with good bowel sounds.  EXTREMITIES:  Reveal no edema whatsoever.   She is being followed in the [facility].   PLAN:  1. Restart metoprolol 12.5 mg b.i.d.  2. Improve rate control and help her blood pressure.  This will also help      with her mild reduction of LV function and potential heart failure.  3. She has a pacer device check-up here in the near future.  I have asked      her to keep that appointment.  I will plan on seeing her back again      myself in three months.                               Thomas C. Daleen Squibb, MD, [facility]    TCW/MedQ  DD:  06/21/2006  DT:  06/21/2006  Job #:  [id number]   cc:   Ramon Dredge L. Juanetta Gosling, MD

## 2011-03-30 NOTE — Consult Note (Signed)
[name], [name]               ACCOUNT NO.:  [id number]   MEDICAL RECORD NO.:  [id number]          PATIENT TYPE:  INP   LOCATION:  1416                         FACILITY:  [facility]   PHYSICIAN:  [name], M.D. [facility] OF BIRTH:  17-Mar-1923   DATE OF CONSULTATION:  01/15/2006  DATE OF DISCHARGE:                                   CONSULTATION   [name] is an 77 year old female with a past medical history of  hypertension, paroxysmal atrial fibrillation, amiodarone/Coumadin therapy,  and recent cholecystectomy admitted with nausea and vomiting by the GI  service who we are asked to evaluate for bradycardia. The patient has been  followed by Dr. Daleen Squibb previously for paroxysmal atrial fibrillation. Her LV  function has been preserved. It should be noted that she does become  symptomatic when she is in atrial fibrillation and has required  cardioversion in the past. She has been maintained on amiodarone and  Coumadin therapy. She has bee noted to be bradycardic in the office at times  but her beta blocker has been discontinued. The patient did undergo  cholecystectomy in December. Over the past 6-7 weeks she has complained of  persistent nausea and vomiting on a daily basis. There is no fever or chills  nor has she had any abdominal pain. There is no melena, hematochezia, or  acholic stools. She has developed progressive increased weakness with falls.  There has been no chest pain, dyspnea or syncope. She was seen by the GI  service yesterday and because of her nausea, vomiting and weakness, she was  admitted for further evaluation and IV fluids. Overnight her telemetry  revealed bradycardia to rates in the high 20s with occasional junctional  escape beats. Cardiology is now asked to further evaluate.   MEDICATIONS:  Amiodarone 200 mg p.o. daily, Claritin, Coumadin and Phenergan  as needed.   ALLERGIES:  She has no known drug allergies.   SOCIAL HISTORY:  She does not smoke nor  does she consume alcohol.   FAMILY HISTORY:  Negative for coronary artery disease.   PAST MEDICAL HISTORY:  Significant for hypertension but she denies any  diabetes mellitus or hyperlipidemia. She has a history of atrial  fibrillation as described in the HPI. She has had previous knee replacement,  hip replacement and back surgery. She has also had a recent cholecystectomy  as described in the HPI.   REVIEW OF SYSTEMS:  She denies any headaches, fevers or chills. There is no  productive cough or hemoptysis. There is no dysphagia, odynophagia, melena  or hematochezia. There is no dysuria or hematuria. There is no rash or  seizure activity. There is no orthopnea, PND or pedal edema. She has had the  nausea and vomiting. The remaining systems are negative.   PHYSICAL EXAMINATION:  VITAL SIGNS:  Her physical exam today shows a blood  pressure that is elevated at 168/83 and her pulse is 48. She is afebrile.  She is 97% on room air.  GENERAL:  She is well-developed and well-nourished in no acute distress. Her  skin is warm and dry. She does not  appear to be depressed and there is no  peripheral clubbing.  HEENT:  Unremarkable with normal eyelids.  NECK:  Supple with a mildly increased upstroke bilaterally. There are no  bruits noted. I cannot appreciate jugular venous distention or thyromegaly.  CHEST:  Shows mild crackles in the right lower lobe. Otherwise clear to  auscultation with normal expansion.  CARDIOVASCULAR:  Reveals a bradycardic rate but a regular rhythm. There are  no murmurs, rubs or gallops noted.  ABDOMEN:  Nontender, nondistended, positive bowel sounds, no  hepatosplenomegaly, no mass appreciated. There was no abdominal bruit. She  has 2+ femoral pulses bilaterally and no bruits.  EXTREMITIES:  Show varicosities but there is no edema noted. She has 2+  dorsalis pedis pulses bilaterally. There are no cords palpated.  NEUROLOGIC:  Grossly intact.   LABORATORY DATA:   White blood cell count of 7.2 with a hemoglobin of 12.2  and hematocrit of 37.8. Her platelet count is 259. Her sodium is 141 with  potassium of 3.4. Her BUN and creatinine and 4 and 0.7. Her bilirubin and  alkaline phosphatase are normal. Her SGOT is elevated at 71 and her SGPT is  95. Her albumin is 2.2. Her electrocardiogram shows a marked sinus  bradycardia at a rate of 44. There is a first degree AV block. Her abdominal  CT does not show any significant abnormality.   1.  Asymptomatic bradycardia.  2.  History of paroxysmal atrial fibrillation.  3.  Amiodarone and Coumadin therapy.  4.  Status post recent cholecystectomy.  5.  Persistent nausea and vomiting.  6.  Hypertension, admitted by the GI service.   I agree with IV fluids. She has been noted to be bradycardic on telemetry  but this is asymptomatic. However, she does have a history of extremely  symptomatic atrial fibrillation. I therefore think she will most likely need  an antiarrhythmic to maintenance  rhythm. Given that she is dropping into  the 20's, I think she will most likely need a pacemaker and I will discuss  this with Dr. Daleen Squibb and the electrophysiology service. Temporally, her nausea  and vomiting seems to coincide with her cholecystectomy and a complication  related to that. However, I wonder if there may be contribution from  amiodarone but this appears to be less likely. We will hold it with you  while she is in the hospital.           ______________________________  [name], M.D. [name]     BC/MEDQ  D:  01/15/2006  T:  01/16/2006  Job:  [id number]

## 2011-03-30 NOTE — Op Note (Signed)
[place]  Patient:    [name]                       MRN: [id number] Proc. Date: 09/28/99 Adm. Date:  [id number] Attending:  Marlowe Kays Page                           Operative Report  PREOPERATIVE DIAGNOSIS:  Degenerative arthritis of right knee.  POSTOPERATIVE DIAGNOSIS:  Degenerative arthritis of right knee.  OPERATION:  Osteonics total knee replacement, right.  SURGEON:  Illene Labrador. [place], M.D.  ASSISTANT:  Philips J. Montez Morita, M.D.  ANESTHESIA:  General.  INDICATIONS FOR PROCEDURE:  She had bone-on-bone abutment in the lateral compartment of the knee joint on standing x-ray with considerable pain.  DESCRIPTION OF PROCEDURE:  Prophylactic antibiotics and satisfactory general anesthesia, and Foley catheter was inserted.  Care was taken to protect the right total hip.  Pneumatic tourniquet applied.  A Stulberg foot stabilizer.  The right knee from the tourniquet to the ankle was prepped with DuraPrep and draped as a  sterile field.  Ioban employed.  Vertical midline incision down to the patellar  mechanism with a medial parapatellar incision to open the joint.  Osteophytes around the femur and patella were removed.  The patellar mechanism was freed up, the patella everted, and the knee flexed.  The anterior portions of both menisci were detached, and the ACL excised.  I then placed a 5/16 inch drill hole in the distal femur followed by the canal finder which was gently introduced because of the total hip replacement.  I then placed the axis liner, set at 5 degrees for he right knee and a 10 mm cut off the distal femur.  I incised the distal femur for the femoral component with the _______ guide and a #7 seemed to be the best fit. The _________ were placed and the distal femoral cutting jig placed with anterior and posterior cuts, and posterior and anterior chamfering made.  I then went to the tibial.  A ______ cut  was made and the remnants of the menisci were removed.  Because of the valgus deformity and tightness laterally, I released a portion of the lateral collateral ligament and the popliteus from the femur. I then sized the tibia with a #7 base plate and intermedullary drill hole followed by a step cut drill were made, and inserted the intermedullary pole with the external cutting guide, and external rods ________.  Initially, I set the stylus for cutting 4 mm off from the depressed lateral tibial plateau.  A 5 degree posterior cut was made and will return to the femur.  Will replace the femoral jig for creating the patellar groove which was made, and then placed the trial femoral component which fit nicely. I then found that I did not remove the bone from the tibia, and I went back and removed an additional 4 mm, and following this, a 7 m spacer fit nicely.  At this point, I sized the patella at a 26, and we used the  10 mm recessed cutting jig to make a 10 mm recess cut.  Then inserted the guide for making the three fixation holes which were made.  The trial prosthesis was then  placed in the recess, and bone was trimmed up around the parameter with the shaver and small rongeurs.  We then returned to the tibia, and once  again checked the sizing at 7, and with the external guide, split the bimalleolar distance, marked the best position for the tibial tray, which was also ________ on flexion and extension.  The tibial tray  was then secured with pins, kind of with a tripod apparatus placed and the keel  reamers used up to a 7 cemented.  I then waterpicked the knee while the methyl methacrylate was being mixed.  We applied the methyl methacrylate first to the tibia, placed in the tibial tray which was impacted with excess methyl methacrylate being removed.  I then placed the methacrylate on the femur and impacted the femoral component, again removing excess amounts of  methacrylate and using the  8 spacer temporarily to hold the knee in extension while we glued in the patellar components, holding it with a patellar clamp.  Once the methyl methacrylate had  hardened, I removed excess _______ of it from around the component, and then jumped to a 12 mm pacer which seemed to be ideal with full extension and no instability.  Consequently, we irrigated the wound well and once again checked for particular  pieces of methyl methacrylate and placed the final 12 mm spacer, reducing the knee and finding it to have excellent motion with no instability.  I then checked be  sure that no lateral release was required on the patellar mechanism and none was. A Hemovac was inserted.  We then closed the wound with interrupted #1 Vicryl in two layers and quadriceps, and two layers distally with one in the synovium and one in the capsule.  The subcutaneous tissue was closed with a combination of #1 and  2-0 Vicryl, and the skin with staples.  Betadine Adaptic dry sterile dressing were applied.  The tourniquet was released with 102 minutes of tourniquet time having elapsed.  She tolerated the procedure well and was taken to the recovery room in satisfactory condition with no complications.  Estimated blood loss was none.  Blood replacement none. DD:  09/28/99 TD:  10/01/99 Job: [id number]

## 2011-03-30 NOTE — Discharge Summary (Signed)
[name], UN               ACCOUNT NO.:  [id number]   MEDICAL RECORD NO.:  [id number]          PATIENT TYPE:  INP   LOCATION:  1416                         FACILITY:  [facility]   PHYSICIAN:  Rachael Fee, M.D. DATE OF BIRTH:  [birth year]   DATE OF ADMISSION:  01/14/2006  DATE OF DISCHARGE:                                 DISCHARGE SUMMARY   ADDENDUM TO DISCHARGE SUMMARY   The patient has been made a follow up appointment with Dr. Rob Bunting in  the office on February 26, 2006 at 11:00 A.M. and will plan to check her liver  function studies at that time.  She also needs to return to Dr. Juanetta Gosling for  general medical care in three to four weeks.      Mike Gip, P.A.-C. [facility]    ______________________________  Rachael Fee, M.D.    AE/MEDQ  D:  01/25/2006  T:  01/25/2006  Job:  [id number]   cc:   Ramon Dredge L. Juanetta Gosling, M.D.  Fax: [phone]

## 2011-03-30 NOTE — Op Note (Signed)
[name], [name]               ACCOUNT NO.:  [id number]   MEDICAL RECORD NO.:  [id number]          PATIENT TYPE:  INP   LOCATION:  A312                          FACILITY:  [facility]   PHYSICIAN:  Dalia Heading, M.D.  DATE OF BIRTH:  [birth year]   DATE OF PROCEDURE:  10/19/2005  DATE OF DISCHARGE:                                 OPERATIVE REPORT   PREOPERATIVE DIAGNOSIS:  Cholecystitis, cholelithiasis.   POSTOPERATIVE DIAGNOSIS:  Cholecystitis, cholelithiasis.   PROCEDURE:  Laparoscopic cholecystectomy.   SURGEON:  Dalia Heading, M.D.   ASSISTANT:  Bernerd Limbo. Leona Carry, M.D.   ANESTHESIA:  General endotracheal anesthesia.   INDICATIONS FOR PROCEDURE:  The patient is an 77 year old white female who  presents with cholecystitis secondary to cholelithiasis.  She was seen by  Dr. Daleen Squibb of cardiology preoperatively and cleared for surgery.  The risks  and benefits of the procedure, including bleeding, infection, hepatobiliary  injury, and the possibility of an open procedure, were fully explained to  the patient who gave informed consent.   DESCRIPTION OF PROCEDURE:  The patient was placed in the supine position.  After induction of general endotracheal anesthesia, the abdomen was prepped  and draped using the usual sterile technique with Betadine.  Surgical site  confirmation was performed.   A supraumbilical incision was made down to the fascia.  A Veress needle was  introduced into the abdominal cavity, and confirmation of placement was done  using the saline drop test.  The abdomen was then insufflated to 16 mmHg  pressure.  An 11-mm trocar was introduced into the abdominal cavity under  direct visualization without difficulty.  The patient was placed in reverse  Trendelenburg position, and an additional 11-mm trocar was placed in the  epigastric region and 5-mm trocars were placed in the right upper quadrant  and right flank regions.  The liver was inspected and noted to be  within  normal limits.  The gallbladder was retracted superiorly and laterally.  The  dissection was begun around the infundibulum of the gallbladder.  The cystic  duct was first identified.  Its juncture to the infundibulum was fully  identified.  Endoclips were placed proximally and distally on the cystic  duct, and the cystic duct was divided.  This was likewise done on the cystic  artery.  The gallbladder was then freed away from the gallbladder fossa  using Bovie electrocautery.  The gallbladder was delivered through the  epigastric trocar site using an EndoCatch bag.  The gallbladder fossa was  inspected, and no abnormal bleeding or bile leakage was noted.  Surgicel was  placed in the gallbladder fossa.  All fluid and air were then evacuated from  the abdominal cavity prior to removal of the trocars.   All wounds were irrigated with normal saline.  All wounds were injected with  0.5% Sensorcaine.  The supraumbilical fascia was reapproximated using an 0  Vicryl interrupted suture.  All skin incisions were closed using staples.  Betadine ointment and dry sterile dressings were applied.   All tape and needle counts were correct at the end  of the procedure.  The  patient was extubated in the operating room and went to the recovery room  awake and in stable condition.   COMPLICATIONS:  None.   SPECIMENS:  Gallbladder with stones.   ESTIMATED BLOOD LOSS:  Minimal.      Dalia Heading, M.D.  Electronically Signed     MAJ/MEDQ  D:  10/19/2005  T:  10/19/2005  Job:  [id number]   cc:   Ramon Dredge L. Juanetta Gosling, M.D.  Fax: [phone]

## 2011-04-02 ENCOUNTER — Ambulatory Visit (INDEPENDENT_AMBULATORY_CARE_PROVIDER_SITE_OTHER): Payer: Medicare Other | Admitting: *Deleted

## 2011-04-02 DIAGNOSIS — I4891 Unspecified atrial fibrillation: Secondary | ICD-10-CM

## 2011-04-23 ENCOUNTER — Ambulatory Visit (INDEPENDENT_AMBULATORY_CARE_PROVIDER_SITE_OTHER): Payer: Medicare Other | Admitting: *Deleted

## 2011-04-23 DIAGNOSIS — I4891 Unspecified atrial fibrillation: Secondary | ICD-10-CM

## 2011-04-23 DIAGNOSIS — I495 Sick sinus syndrome: Secondary | ICD-10-CM

## 2011-04-23 NOTE — Progress Notes (Signed)
Pacer check

## 2011-04-30 ENCOUNTER — Ambulatory Visit (INDEPENDENT_AMBULATORY_CARE_PROVIDER_SITE_OTHER): Payer: Medicare Other | Admitting: *Deleted

## 2011-04-30 DIAGNOSIS — Z7901 Long term (current) use of anticoagulants: Secondary | ICD-10-CM | POA: Insufficient documentation

## 2011-04-30 DIAGNOSIS — I4891 Unspecified atrial fibrillation: Secondary | ICD-10-CM

## 2011-05-05 ENCOUNTER — Emergency Department (HOSPITAL_COMMUNITY): Payer: Medicare Other

## 2011-05-05 ENCOUNTER — Emergency Department (HOSPITAL_COMMUNITY)
Admission: EM | Admit: 2011-05-05 | Discharge: 2011-05-05 | Disposition: A | Discharge status: Home / Self Care | Payer: Medicare Other | Attending: Emergency Medicine | Admitting: Emergency Medicine

## 2011-05-05 DIAGNOSIS — I4891 Unspecified atrial fibrillation: Secondary | ICD-10-CM | POA: Insufficient documentation

## 2011-05-05 DIAGNOSIS — Z79899 Other long term (current) drug therapy: Secondary | ICD-10-CM | POA: Insufficient documentation

## 2011-05-05 DIAGNOSIS — Z950 Presence of cardiac pacemaker: Secondary | ICD-10-CM | POA: Insufficient documentation

## 2011-05-05 DIAGNOSIS — Z7901 Long term (current) use of anticoagulants: Secondary | ICD-10-CM | POA: Insufficient documentation

## 2011-05-05 DIAGNOSIS — H8109 Meniere's disease, unspecified ear: Secondary | ICD-10-CM | POA: Insufficient documentation

## 2011-05-05 DIAGNOSIS — H9319 Tinnitus, unspecified ear: Secondary | ICD-10-CM | POA: Insufficient documentation

## 2011-05-05 DIAGNOSIS — I10 Essential (primary) hypertension: Secondary | ICD-10-CM | POA: Insufficient documentation

## 2011-05-05 DIAGNOSIS — H81399 Other peripheral vertigo, unspecified ear: Secondary | ICD-10-CM | POA: Insufficient documentation

## 2011-05-28 ENCOUNTER — Ambulatory Visit (INDEPENDENT_AMBULATORY_CARE_PROVIDER_SITE_OTHER): Payer: Medicare Other | Admitting: *Deleted

## 2011-05-28 DIAGNOSIS — I4891 Unspecified atrial fibrillation: Secondary | ICD-10-CM

## 2011-05-28 LAB — POCT INR: INR: 2.2

## 2011-06-23 ENCOUNTER — Other Ambulatory Visit: Payer: Self-pay | Admitting: Cardiology

## 2011-06-25 ENCOUNTER — Ambulatory Visit (INDEPENDENT_AMBULATORY_CARE_PROVIDER_SITE_OTHER): Payer: Medicare Other | Admitting: *Deleted

## 2011-06-25 DIAGNOSIS — I4891 Unspecified atrial fibrillation: Secondary | ICD-10-CM

## 2011-06-25 NOTE — Telephone Encounter (Signed)
[place] pt, rx request

## 2011-07-19 ENCOUNTER — Ambulatory Visit (INDEPENDENT_AMBULATORY_CARE_PROVIDER_SITE_OTHER): Payer: Medicare Other | Admitting: *Deleted

## 2011-07-19 DIAGNOSIS — I4891 Unspecified atrial fibrillation: Secondary | ICD-10-CM

## 2011-08-16 ENCOUNTER — Ambulatory Visit (INDEPENDENT_AMBULATORY_CARE_PROVIDER_SITE_OTHER): Payer: Medicare Other | Admitting: *Deleted

## 2011-08-16 DIAGNOSIS — I4891 Unspecified atrial fibrillation: Secondary | ICD-10-CM

## 2011-08-22 ENCOUNTER — Other Ambulatory Visit: Payer: Self-pay | Admitting: Cardiology

## 2011-08-29 ENCOUNTER — Ambulatory Visit (INDEPENDENT_AMBULATORY_CARE_PROVIDER_SITE_OTHER): Payer: Medicare Other | Admitting: *Deleted

## 2011-08-29 DIAGNOSIS — Z7901 Long term (current) use of anticoagulants: Secondary | ICD-10-CM

## 2011-08-29 DIAGNOSIS — I4891 Unspecified atrial fibrillation: Secondary | ICD-10-CM

## 2011-08-29 LAB — POCT INR: INR: 2

## 2011-09-17 ENCOUNTER — Telehealth: Payer: Self-pay | Admitting: Cardiology

## 2011-09-17 ENCOUNTER — Emergency Department (HOSPITAL_COMMUNITY): Payer: Medicare Other

## 2011-09-17 ENCOUNTER — Encounter (HOSPITAL_COMMUNITY): Payer: Self-pay

## 2011-09-17 ENCOUNTER — Emergency Department (HOSPITAL_COMMUNITY)
Admission: EM | Admit: 2011-09-17 | Discharge: 2011-09-17 | Disposition: A | Discharge status: Home / Self Care | Payer: Medicare Other | Attending: Emergency Medicine | Admitting: Emergency Medicine

## 2011-09-17 DIAGNOSIS — S20219A Contusion of unspecified front wall of thorax, initial encounter: Secondary | ICD-10-CM

## 2011-09-17 DIAGNOSIS — Z7901 Long term (current) use of anticoagulants: Secondary | ICD-10-CM | POA: Insufficient documentation

## 2011-09-17 DIAGNOSIS — Z96649 Presence of unspecified artificial hip joint: Secondary | ICD-10-CM | POA: Insufficient documentation

## 2011-09-17 DIAGNOSIS — W1809XA Striking against other object with subsequent fall, initial encounter: Secondary | ICD-10-CM | POA: Insufficient documentation

## 2011-09-17 DIAGNOSIS — I4891 Unspecified atrial fibrillation: Secondary | ICD-10-CM | POA: Insufficient documentation

## 2011-09-17 DIAGNOSIS — Z96659 Presence of unspecified artificial knee joint: Secondary | ICD-10-CM | POA: Insufficient documentation

## 2011-09-17 DIAGNOSIS — R071 Chest pain on breathing: Secondary | ICD-10-CM | POA: Insufficient documentation

## 2011-09-17 MED ORDER — HYDROCODONE-ACETAMINOPHEN 5-325 MG PO TABS: 1.0000 | ORAL_TABLET | Freq: Four times a day (QID) | ORAL | Status: AC | PRN

## 2011-09-17 NOTE — Telephone Encounter (Signed)
Another note

## 2011-09-17 NOTE — ED Provider Notes (Addendum)
Scribed for Shelda Jakes, MD, the patient was seen in room APA19/APA19 . This chart was scribed by Ellie Lunch.   CSN: [id number] Arrival date & time: 09/17/2011 11:08 AM   First MD Initiated Contact with Patient 09/17/11 1242      Chief Complaint  Patient presents with  . Fall    (Consider location/radiation/quality/duration/timing/severity/associated sxs/prior treatment) HPI Pt seen at 1:13 PM [name] is a 77 y.o. female who presents to the Emergency Department complaining of fall 3 days ago. Pt ambulates normally with walker.  Pt says she reached to grab broom when she lost balance, and fell. Pt did not hit the ground, but did hit her left chest against the wall and heard a "crack." Pt currently c/o pain to her left chest wall. Pain is constant since onset and described as severe. Pain is worse with deep breaths and palpation.  Denies hitting head, LOC. Pt denies any other pain or injury. Pt has ambulated since.   Pt has pacemaker for a fibb. Pt on coumadin Cardiologist Dr. Juanito Doom.  PCP Dr. Juanetta Gosling.  History reviewed. No pertinent past medical history.  Past Surgical History  Procedure Date  . Back surgery   . Hip arthoplasty     total  . Knee arthroplasty     Family History  Problem Relation Age of Onset  . Heart disease Mother   . Prostate cancer Father   . Heart failure Father     History  Substance Use Topics  . Smoking status: Never Smoker   . Smokeless tobacco: Not on file  . Alcohol Use: No    Review of Systems  Constitutional: Negative for fever and chills.  HENT: Negative for congestion and sore throat.   Respiratory: Negative for cough and shortness of breath.   Cardiovascular: Positive for chest pain (left chest wall pain). Negative for leg swelling.  Gastrointestinal: Negative for abdominal pain and diarrhea.  Genitourinary: Negative for dysuria and difficulty urinating.  Musculoskeletal: Negative for back pain and joint swelling.    Skin: Negative for color change and rash.  Neurological: Negative for dizziness and headaches.  Psychiatric/Behavioral: Negative for confusion.  All other systems reviewed and are negative.    Allergies  Review of patient's allergies indicates no known allergies.  Home Medications   Current Outpatient Rx  Name Route Sig Dispense Refill  . LISINOPRIL 40 MG PO TABS  TAKE 1 TABLET EVERY DAY 30 tablet 10  . METOPROLOL TARTRATE 25 MG PO TABS Oral Take 25 mg by mouth 2 (two) times daily.      . THERAGRAN-M PO TABS Oral Take 1 tablet by mouth daily.      Marland Kitchen NITROFURANTOIN MONOHYD MACRO 100 MG PO CAPS Oral Take 100 mg by mouth 2 (two) times daily.     Marland Kitchen SOLIFENACIN SUCCINATE 5 MG PO TABS Oral Take 5 mg by mouth daily.      . WARFARIN SODIUM 5 MG PO TABS  TAKE AS DIRECTED 30 tablet 2    BP 123/79  Pulse 65  Temp(Src) 97.6 F (36.4 C) (Oral)  Resp 20  Ht 5' 5.5" (1.664 m)  Wt 174 lb (78.926 kg)  BMI 28.51 kg/m2  SpO2 100%  Physical Exam  Constitutional: She is oriented to person, place, and time. She appears well-developed and well-nourished.  HENT:  Head: Normocephalic and atraumatic.  Eyes: Conjunctivae and EOM are normal.  Neck: Normal range of motion. Neck supple.  Cardiovascular: Normal rate, regular rhythm and  normal heart sounds.   Pulmonary/Chest: She exhibits tenderness (localized tenderness left lateral ribs around 6,7,8).  Abdominal: Soft. There is no tenderness.  Neurological: She is alert and oriented to person, place, and time. Coordination normal.  Skin: Skin is warm and dry.  Psychiatric: She has a normal mood and affect.    ED Course  Procedures (including critical care time) OTHER DATA REVIEWED: Nursing notes, vital signs, and past medical records reviewed.   DIAGNOSTIC STUDIES: Oxygen Saturation is 100% on room air, normal by my interpretation.     Labs Reviewed - No data to display Dg Ribs Unilateral W/chest Left  09/17/2011  *RADIOLOGY REPORT*   Clinical Data:  Left rib pain, fell on 09/14/2011  LEFT RIBS AND CHEST - 3+ VIEW  Comparison: Chest radiograph 04/20/2010  Findings: Left subclavian sequential transvenous pacemaker leads project at right atrium and right ventricle. Enlargement of cardiac silhouette. Tortuous thoracic aorta. Slightly prominent left superior mediastinal soft tissues unchanged since 2011. Bronchitic changes without infiltrate or effusion. No pneumothorax. Bones demineralized. Pacemaker pack obscures portions of the left ribs and left mid lung. No definite rib fracture or bone destruction.  IMPRESSION: Enlargement of cardiac silhouette. Bronchitic changes without infiltrate or effusion. No definite acute rib fractures identified.  Original Report Authenticated By: Lollie Marrow, M.D.   1:41 PM PT recheck. Xray dies no indicate rib fracture. Plan to discharge  ED DISCHARGE MEDICATION  Impression: Left lateral chest wall pain Suspect rib contusion or cold rib fracture.   MDM    She will fall on Friday up against the side of a railing injuring her left lateral chest wall. Pain has not improved. There was no loss of consciousness  there was no syncope. Chest x-ray and rib series negative. Patient did hear a pop in the left side of her chest wall. Patient has some concern for her pacemaker however the area of the injury was not close to that. Patient lives by herself but she does have local support.    I personally performed the services described in this documentation, which was scribed in my presence. The recorded information has been reviewed and considered.          Shelda Jakes, MD 09/17/11 1355  Shelda Jakes, MD 09/17/11 1355

## 2011-09-17 NOTE — Telephone Encounter (Signed)
Pt fell in her home while reaching for her broom with her walker last Friday.  She fell over her walker and hit just under the left side of her breast.  She says she heard something "crack" under her breast.  Pt states she could not reach pcp last Friday and did not want to go to the ED.  The discomfort became worse over the weekend with partial relief of extra strength tylenol.  She says when trying to lay on her left side " It made me see stars" and is painful.  Pt is not having any cardiac symptoms per pt. Suggested pt see her pcp today as she may need an xray and does need this evaluated.  She will call them today.  Pt does have transportation. Mylo Red RN

## 2011-09-17 NOTE — Telephone Encounter (Signed)
lmtcb Debbie Nohemi Nicklaus RN  

## 2011-09-17 NOTE — Telephone Encounter (Signed)
Sent to Dr Namon Cirri basket, forwarded to Dr Anola Gurney nurse.

## 2011-09-17 NOTE — Telephone Encounter (Signed)
Patient took a tumble on Friday 10 am and reports that she heard something click, says it sounded like a tree limb cracking, said she felt ok.  She said she started hurting later in the day, and is now having trouble putting on socks.  Patient has a pacemaker.  Patient has transportation issues, she has limited transportation resources.  She will need to know early if she can be seen.  Please call patient asap.

## 2011-09-17 NOTE — ED Notes (Signed)
Pt states she lost her balance and fell Friday. Complain of pain in left rib area with movement

## 2011-09-19 ENCOUNTER — Ambulatory Visit (INDEPENDENT_AMBULATORY_CARE_PROVIDER_SITE_OTHER): Payer: Medicare Other | Admitting: *Deleted

## 2011-09-19 DIAGNOSIS — Z7901 Long term (current) use of anticoagulants: Secondary | ICD-10-CM

## 2011-09-19 DIAGNOSIS — I4891 Unspecified atrial fibrillation: Secondary | ICD-10-CM

## 2011-10-13 ENCOUNTER — Other Ambulatory Visit: Payer: Self-pay | Admitting: Cardiology

## 2011-10-18 ENCOUNTER — Ambulatory Visit (INDEPENDENT_AMBULATORY_CARE_PROVIDER_SITE_OTHER): Payer: Medicare Other | Admitting: *Deleted

## 2011-10-18 DIAGNOSIS — Z7901 Long term (current) use of anticoagulants: Secondary | ICD-10-CM

## 2011-10-18 DIAGNOSIS — I4891 Unspecified atrial fibrillation: Secondary | ICD-10-CM

## 2011-10-24 ENCOUNTER — Encounter: Payer: Self-pay | Admitting: Cardiology

## 2011-10-24 ENCOUNTER — Ambulatory Visit (INDEPENDENT_AMBULATORY_CARE_PROVIDER_SITE_OTHER): Payer: Medicare Other | Admitting: Cardiology

## 2011-10-24 ENCOUNTER — Ambulatory Visit: Payer: Medicare Other | Admitting: Cardiology

## 2011-10-24 VITALS — BP 133/75 | HR 75 | Ht 65.5 in | Wt 180.0 lb

## 2011-10-24 DIAGNOSIS — Z950 Presence of cardiac pacemaker: Secondary | ICD-10-CM

## 2011-10-24 DIAGNOSIS — I10 Essential (primary) hypertension: Secondary | ICD-10-CM

## 2011-10-24 DIAGNOSIS — I4891 Unspecified atrial fibrillation: Secondary | ICD-10-CM

## 2011-10-24 DIAGNOSIS — Z7901 Long term (current) use of anticoagulants: Secondary | ICD-10-CM

## 2011-10-24 MED ORDER — METOPROLOL TARTRATE 25 MG PO TABS: 25.0000 mg | ORAL_TABLET | Freq: Two times a day (BID) | ORAL | Status: DC

## 2011-10-24 NOTE — Assessment & Plan Note (Signed)
Good control

## 2011-10-24 NOTE — Patient Instructions (Signed)
Your physician recommends that you schedule a follow-up appointment in: 6 months  

## 2011-10-24 NOTE — Assessment & Plan Note (Signed)
Stable. No change in treatment. I have told her to be very careful not to fall on Coumadin. His also talked about head trauma and the need to check that out immediately.

## 2011-10-24 NOTE — Progress Notes (Signed)
HPI  [name] returns today for evaluation and management of her chronic A. Fib and anti- coagulation.  She fell about a month ago. She bruised her left posterior thorax is just recovering. She lost her balance but did not have syncope.  She is using a walker. She remains on Coumadin. She did not hit her head. Her she denies any palpitations or chest discomfort. Her blood pressure under good control.  She is no longer driving.   Past Medical History  Diagnosis Date  . COPD (chronic obstructive pulmonary disease)   . Arthritis   . Hypertension   . Atrial fibrillation     Current Outpatient Prescriptions  Medication Sig Dispense Refill  . lisinopril (PRINIVIL,ZESTRIL) 40 MG tablet TAKE 1 TABLET EVERY DAY  30 tablet  10  . metoprolol tartrate (LOPRESSOR) 25 MG tablet TAKE 1 TABLET TWICE A DAY  60 tablet  5  . multivitamin-iron-minerals-folic acid (THERAPEUTIC-M) TABS tablet Take 1 tablet by mouth daily.        Marland Kitchen warfarin (COUMADIN) 5 MG tablet TAKE AS DIRECTED  30 tablet  2    No Known Allergies  Family History  Problem Relation Age of Onset  . Heart disease Mother   . Prostate cancer Father   . Heart failure Father     History   Social History  . Marital Status: Widowed    Spouse Name: N/A    Number of Children: N/A  . Years of Education: N/A   Occupational History  . Retired    Social History Main Topics  . Smoking status: Never Smoker   . Smokeless tobacco: Not on file  . Alcohol Use: No  . Drug Use: No  . Sexually Active:    Other Topics Concern  . Not on file   Social History Narrative  . No narrative on file    ROS ALL NEGATIVE EXCEPT THOSE NOTED IN HPI  PE  General Appearance: well developed, well nourished in no acute distress, using a walker with good stability HEENT: symmetrical face, PERRLA, good dentition  Neck: no JVD, thyromegaly, or adenopathy, trachea midline Chest: symmetric without deformity Cardiac: PMI non-displaced, regular  rate and rhythm, normal S1, S2, no gallop or murmur Lung: clear to ausculation and percussion Vascular: all pulses full without bruits  Abdominal: nondistended, nontender, good bowel sounds, no HSM, no bruits Extremities: no cyanosis, clubbing or edema, no sign of DVT, no varicosities  Skin: normal color, no rashes Neuro: alert and oriented x 3, non-focal Pysch: normal affect  EKG Chronic A. Fib, will control rate, occasional pacer spike. BMET    Component Value Date/Time   NA 136 04/26/2010 0350   K 3.6 04/26/2010 0350   CL 104 04/26/2010 0350   CO2 28 04/26/2010 0350   GLUCOSE 137* 04/26/2010 0350   BUN 8 04/26/2010 0350   CREATININE 0.69 04/26/2010 0350   CALCIUM 8.0* 04/26/2010 0350   GFRNONAA >60 04/26/2010 0350   GFRAA  Value: >60        The eGFR has been calculated using the MDRD equation. This calculation has not been validated in all clinical situations. eGFR's persistently <60 mL/min signify possible Chronic Kidney Disease. 04/26/2010 0350    Lipid Panel     Component Value Date/Time   CHOL 166 03/05/2007   TRIG 129 03/05/2007   HDL 50 03/05/2007   LDLCALC 90 03/05/2007    CBC    Component Value Date/Time   WBC 8.8 04/26/2010 1150   RBC 3.27*  04/26/2010 1150   HGB 10.2* 04/26/2010 1150   HCT 31.8* 04/26/2010 1150   PLT 201 04/26/2010 1150   MCV 97.1 04/26/2010 1150   MCHC 32.1 04/26/2010 1150   RDW 19.3* 04/26/2010 1150   LYMPHSABS 2.3 04/20/2010 1205   MONOABS 0.8 04/20/2010 1205   EOSABS 0.2 04/20/2010 1205   BASOSABS 0.0 04/20/2010 1205

## 2011-11-15 ENCOUNTER — Ambulatory Visit (INDEPENDENT_AMBULATORY_CARE_PROVIDER_SITE_OTHER): Payer: Medicare Other | Admitting: *Deleted

## 2011-11-15 DIAGNOSIS — I4891 Unspecified atrial fibrillation: Secondary | ICD-10-CM

## 2011-11-15 DIAGNOSIS — Z7901 Long term (current) use of anticoagulants: Secondary | ICD-10-CM

## 2011-12-02 ENCOUNTER — Other Ambulatory Visit: Payer: Self-pay | Admitting: Cardiology

## 2011-12-06 ENCOUNTER — Ambulatory Visit (INDEPENDENT_AMBULATORY_CARE_PROVIDER_SITE_OTHER): Payer: Medicare Other | Admitting: *Deleted

## 2011-12-06 DIAGNOSIS — I4891 Unspecified atrial fibrillation: Secondary | ICD-10-CM

## 2011-12-06 DIAGNOSIS — Z7901 Long term (current) use of anticoagulants: Secondary | ICD-10-CM

## 2011-12-06 LAB — POCT INR: INR: 2.9

## 2011-12-12 ENCOUNTER — Ambulatory Visit (INDEPENDENT_AMBULATORY_CARE_PROVIDER_SITE_OTHER): Payer: Medicare Other | Admitting: Internal Medicine

## 2011-12-12 ENCOUNTER — Encounter: Payer: Self-pay | Admitting: Internal Medicine

## 2011-12-12 VITALS — BP 135/78 | HR 84 | Resp 18 | Ht 65.0 in | Wt 180.0 lb

## 2011-12-12 DIAGNOSIS — I495 Sick sinus syndrome: Secondary | ICD-10-CM

## 2011-12-12 DIAGNOSIS — Z950 Presence of cardiac pacemaker: Secondary | ICD-10-CM

## 2011-12-12 DIAGNOSIS — I4891 Unspecified atrial fibrillation: Secondary | ICD-10-CM

## 2011-12-12 LAB — PACEMAKER DEVICE OBSERVATION
ATRIAL PACING PM: 1
BRDY-0002RV: 60 {beats}/min
BRDY-0004RV: 110 {beats}/min
RV LEAD THRESHOLD: 0.25 V

## 2011-12-12 NOTE — Progress Notes (Signed)
HPI [name] is an 78 yo woman with a h/o atrial fibrillation, syncope secondary to long pauses and CHB. She is s/p PPM, implanted 6 years ago. She denies chest pain, sob, and has minimal peripheral edema. She has had trouble with ambulation due to arthritis and lower back problems. No Known Allergies   Current Outpatient Prescriptions  Medication Sig Dispense Refill  . lisinopril (PRINIVIL,ZESTRIL) 40 MG tablet TAKE 1 TABLET EVERY DAY  30 tablet  10  . metoprolol tartrate (LOPRESSOR) 25 MG tablet Take 1 tablet (25 mg total) by mouth 2 (two) times daily.  180 tablet  3  . multivitamin-iron-minerals-folic acid (THERAPEUTIC-M) TABS tablet Take 1 tablet by mouth daily.        Marland Kitchen warfarin (COUMADIN) 5 MG tablet TAKE AS DIRECTED  30 tablet  2     Past Medical History  Diagnosis Date  . COPD (chronic obstructive pulmonary disease)   . Arthritis   . Hypertension   . Atrial fibrillation   . Sinoatrial node dysfunction     ROS:   All systems reviewed and negative except as noted in the HPI.   Past Surgical History  Procedure Date  . Back surgery   . Hip arthoplasty     total  . Knee arthroplasty      Family History  Problem Relation Age of Onset  . Heart disease Mother   . Prostate cancer Father   . Heart failure Father      History   Social History  . Marital Status: Widowed    Spouse Name: N/A    Number of Children: N/A  . Years of Education: N/A   Occupational History  . Retired    Social History Main Topics  . Smoking status: Never Smoker   . Smokeless tobacco: Not on file  . Alcohol Use: No  . Drug Use: No  . Sexually Active:    Other Topics Concern  . Not on file   Social History Narrative  . No narrative on file     BP 135/78  Pulse 84  Resp 18  Ht 5\' 5"  (1.651 m)  Wt 81.647 kg (180 lb)  BMI 29.95 kg/m2  Physical Exam:  Well appearing NAD HEENT: Unremarkable Neck:  No JVD, no thyromegally Lungs:  Clear with no wheezes. HEART:   Regular rate rhythm, no murmurs, no rubs, no clicks Abd:  soft, positive bowel sounds, no organomegally, no rebound, no guarding Ext:  2 plus pulses, no edema, no cyanosis, no clubbing Skin:  No rashes no nodules Neuro:  CN II through XII intact, motor grossly intact  DEVICE  Normal device function.  See PaceArt for details.   Assess/Plan:

## 2011-12-12 NOTE — Patient Instructions (Signed)
Your physician recommends that you schedule a follow-up appointment in: 1 year  

## 2011-12-12 NOTE — Assessment & Plan Note (Signed)
Her device is working normally. Will recheck in several months. 

## 2011-12-12 NOTE — Assessment & Plan Note (Signed)
Her ventricular rate is well controlled. She will will continue her current meds.

## 2012-01-03 ENCOUNTER — Ambulatory Visit (INDEPENDENT_AMBULATORY_CARE_PROVIDER_SITE_OTHER): Payer: Medicare Other | Admitting: *Deleted

## 2012-01-03 DIAGNOSIS — Z7901 Long term (current) use of anticoagulants: Secondary | ICD-10-CM

## 2012-01-03 DIAGNOSIS — I4891 Unspecified atrial fibrillation: Secondary | ICD-10-CM

## 2012-01-31 ENCOUNTER — Ambulatory Visit (INDEPENDENT_AMBULATORY_CARE_PROVIDER_SITE_OTHER): Payer: Medicare Other | Admitting: *Deleted

## 2012-01-31 DIAGNOSIS — Z7901 Long term (current) use of anticoagulants: Secondary | ICD-10-CM

## 2012-01-31 DIAGNOSIS — I4891 Unspecified atrial fibrillation: Secondary | ICD-10-CM

## 2012-02-14 ENCOUNTER — Other Ambulatory Visit: Payer: Self-pay

## 2012-02-14 ENCOUNTER — Other Ambulatory Visit: Payer: Self-pay | Admitting: *Deleted

## 2012-02-14 MED ORDER — METOPROLOL TARTRATE 25 MG PO TABS: 25.0000 mg | ORAL_TABLET | Freq: Two times a day (BID) | ORAL | Status: DC

## 2012-02-14 MED ORDER — LISINOPRIL 40 MG PO TABS: 20.0000 mg | ORAL_TABLET | Freq: Every day | ORAL | Status: DC

## 2012-02-14 MED ORDER — WARFARIN SODIUM 5 MG PO TABS: ORAL_TABLET | ORAL | Status: DC

## 2012-02-28 ENCOUNTER — Ambulatory Visit (INDEPENDENT_AMBULATORY_CARE_PROVIDER_SITE_OTHER): Payer: Medicare Other | Admitting: *Deleted

## 2012-02-28 DIAGNOSIS — I4891 Unspecified atrial fibrillation: Secondary | ICD-10-CM

## 2012-02-28 DIAGNOSIS — Z7901 Long term (current) use of anticoagulants: Secondary | ICD-10-CM

## 2012-03-07 ENCOUNTER — Other Ambulatory Visit: Payer: Self-pay | Admitting: Cardiology

## 2012-03-10 ENCOUNTER — Other Ambulatory Visit (HOSPITAL_COMMUNITY): Payer: Self-pay | Admitting: Orthopedic Surgery

## 2012-03-10 DIAGNOSIS — M25552 Pain in left hip: Secondary | ICD-10-CM

## 2012-03-10 DIAGNOSIS — M79609 Pain in unspecified limb: Secondary | ICD-10-CM

## 2012-03-11 ENCOUNTER — Other Ambulatory Visit: Payer: Self-pay | Admitting: Cardiology

## 2012-03-17 ENCOUNTER — Encounter (HOSPITAL_COMMUNITY)
Admission: RE | Admit: 2012-03-17 | Discharge: 2012-03-17 | Disposition: A | Discharge status: Home / Self Care | Payer: Medicare Other | Source: Ambulatory Visit | Attending: Orthopedic Surgery | Admitting: Orthopedic Surgery

## 2012-03-17 ENCOUNTER — Encounter (HOSPITAL_COMMUNITY): Payer: Self-pay

## 2012-03-17 DIAGNOSIS — M25552 Pain in left hip: Secondary | ICD-10-CM

## 2012-03-17 DIAGNOSIS — M25559 Pain in unspecified hip: Secondary | ICD-10-CM | POA: Insufficient documentation

## 2012-03-17 DIAGNOSIS — M79609 Pain in unspecified limb: Secondary | ICD-10-CM | POA: Insufficient documentation

## 2012-03-17 HISTORY — DX: Pain in left hip: M25.552

## 2012-03-17 LAB — HM DEXA SCAN: HM Dexa Scan: NORMAL

## 2012-03-17 MED ORDER — TECHNETIUM TC 99M MEDRONATE IV KIT
23.4000 | PACK | Freq: Once | INTRAVENOUS | Status: AC | PRN
  Administered 2012-03-17: 23.4 via INTRAVENOUS

## 2012-03-27 ENCOUNTER — Ambulatory Visit (INDEPENDENT_AMBULATORY_CARE_PROVIDER_SITE_OTHER): Payer: Medicare Other | Admitting: *Deleted

## 2012-03-27 DIAGNOSIS — Z7901 Long term (current) use of anticoagulants: Secondary | ICD-10-CM

## 2012-03-27 DIAGNOSIS — I4891 Unspecified atrial fibrillation: Secondary | ICD-10-CM

## 2012-03-27 LAB — POCT INR: INR: 1.9

## 2012-04-07 ENCOUNTER — Other Ambulatory Visit: Payer: Self-pay | Admitting: Cardiology

## 2012-04-08 ENCOUNTER — Other Ambulatory Visit: Payer: Self-pay | Admitting: Cardiology

## 2012-04-08 NOTE — Telephone Encounter (Signed)
PT STATES THAT SHE HAS ALWAYS TOOK ONE PILL A DAY BUT ON THIS LAST RX IT SAID FOR HER TO TAKE 1/2 TAB TWICE A DAY. HOW SHOULD SHE BE TAKING IT.   SHE HAS TWO PILLS LEFT. PLEASE CALL IN TODAY

## 2012-04-09 MED ORDER — LISINOPRIL 40 MG PO TABS: 40.0000 mg | ORAL_TABLET | Freq: Every day | ORAL | Status: DC

## 2012-04-09 NOTE — Telephone Encounter (Signed)
Spoke with pt, the old RX sent in was a mistake, pt is takimg lisinopril 40mg  1 po daily. Sent in new rrx.

## 2012-04-16 ENCOUNTER — Ambulatory Visit (INDEPENDENT_AMBULATORY_CARE_PROVIDER_SITE_OTHER): Payer: Medicare Other | Admitting: Cardiology

## 2012-04-16 ENCOUNTER — Encounter: Payer: Self-pay | Admitting: Cardiology

## 2012-04-16 VITALS — BP 133/83 | HR 88 | Resp 16 | Ht 65.0 in | Wt 189.0 lb

## 2012-04-16 DIAGNOSIS — I10 Essential (primary) hypertension: Secondary | ICD-10-CM

## 2012-04-16 DIAGNOSIS — Z7901 Long term (current) use of anticoagulants: Secondary | ICD-10-CM

## 2012-04-16 DIAGNOSIS — I4891 Unspecified atrial fibrillation: Secondary | ICD-10-CM

## 2012-04-16 DIAGNOSIS — I495 Sick sinus syndrome: Secondary | ICD-10-CM

## 2012-04-16 DIAGNOSIS — Z950 Presence of cardiac pacemaker: Secondary | ICD-10-CM

## 2012-04-16 NOTE — Progress Notes (Signed)
HPI [name] comes in today for followup, evaluation, and management of her chronic A. fib, sinoatrial dysfunction with permanent pacemaker and anticoagulation.  She's done well except for recent fall. She's had a lot of left hip pain this being managed by orthopedics. She probably had a periarticular hematoma but no fracture.  She uses a walker. She has had no further falls. She's had no problems with her Coumadin.  She denies any palpitations, orthopnea, PND. She does have some minimal edema in her right lower extremity.  Past Medical History  Diagnosis Date  . COPD (chronic obstructive pulmonary disease)   . Arthritis   . Hypertension   . Atrial fibrillation   . Sinoatrial node dysfunction   . Hip pain, left     Current Outpatient Prescriptions  Medication Sig Dispense Refill  . gabapentin (NEURONTIN) 100 MG capsule Take 100 mg by mouth 3 (three) times daily.      Marland Kitchen lisinopril (PRINIVIL,ZESTRIL) 40 MG tablet Take 1 tablet (40 mg total) by mouth daily.  90 tablet  2  . metoprolol tartrate (LOPRESSOR) 25 MG tablet Take 1 tablet (25 mg total) by mouth 2 (two) times daily.  90 tablet  2  . multivitamin-iron-minerals-folic acid (THERAPEUTIC-M) TABS tablet Take 1 tablet by mouth daily.        . solifenacin (VESICARE) 5 MG tablet Take 5 mg by mouth daily.      Marland Kitchen warfarin (COUMADIN) 5 MG tablet Take as directed per coumadin clinic  90 tablet  0  . DISCONTD: metoprolol tartrate (LOPRESSOR) 25 MG tablet TAKE 1 TABLET TWICE A DAY  60 tablet  5  . DISCONTD: warfarin (COUMADIN) 5 MG tablet TAKE AS DIRECTED  30 tablet  2    No Known Allergies  Family History  Problem Relation Age of Onset  . Heart disease Mother   . Prostate cancer Father   . Heart failure Father     History   Social History  . Marital Status: Widowed    Spouse Name: N/A    Number of Children: N/A  . Years of Education: N/A   Occupational History  . Retired    Social History Main Topics  . Smoking status:  Never Smoker   . Smokeless tobacco: Not on file  . Alcohol Use: No  . Drug Use: No  . Sexually Active:    Other Topics Concern  . Not on file   Social History Narrative  . No narrative on file    ROS ALL NEGATIVE EXCEPT THOSE NOTED IN HPI  PE  General Appearance: well developed, well nourished in no acute distress, elderly HEENT: symmetrical face, PERRLA,  Neck: no JVD, thyromegaly, or adenopathy, trachea midline Chest: symmetric without deformity Cardiac: PMI non-displaced, regular rate and rhythm normal S1, S2, no gallop or murmur Lung: clear to ausculation and percussion Vascular: all pulses full without bruits  Abdominal: nondistended, nontender, good bowel sounds, no HSM, no bruits Extremities: no cyanosis, clubbing, minimal pitting edema in the right lower extremity no sign of DVT, no varicosities  Skin: normal color, no rashes Neuro: alert and oriented x 3, non-focal Pysch: normal affect  EKG  BMET    Component Value Date/Time   NA 136 04/26/2010 0350   K 3.6 04/26/2010 0350   CL 104 04/26/2010 0350   CO2 28 04/26/2010 0350   GLUCOSE 137* 04/26/2010 0350   BUN 8 04/26/2010 0350   CREATININE 0.69 04/26/2010 0350   CALCIUM 8.0* 04/26/2010 0350   GFRNONAA >  60 04/26/2010 0350   GFRAA  Value: >60        The eGFR has been calculated using the MDRD equation. This calculation has not been validated in all clinical situations. eGFR's persistently <60 mL/min signify possible Chronic Kidney Disease. 04/26/2010 0350    Lipid Panel     Component Value Date/Time   CHOL 166 03/05/2007   TRIG 129 03/05/2007   HDL 50 03/05/2007   LDLCALC 90 03/05/2007    CBC    Component Value Date/Time   WBC 8.8 04/26/2010 1150   RBC 3.27* 04/26/2010 1150   HGB 10.2* 04/26/2010 1150   HCT 31.8* 04/26/2010 1150   PLT 201 04/26/2010 1150   MCV 97.1 04/26/2010 1150   MCHC 32.1 04/26/2010 1150   RDW 19.3* 04/26/2010 1150   LYMPHSABS 2.3 04/20/2010 1205   MONOABS 0.8 04/20/2010 1205   EOSABS 0.2 04/20/2010  1205   BASOSABS 0.0 04/20/2010 1205

## 2012-04-16 NOTE — Assessment & Plan Note (Signed)
Asymptomatic with good rate control and anticoagulation. No change in management.

## 2012-04-16 NOTE — Patient Instructions (Signed)
**Note De-identified [name] Obfuscation** Your physician recommends that you continue on your current medications as directed. Please refer to the Current Medication list given to you today.  Your physician recommends that you schedule a follow-up appointment in: 1 year  

## 2012-04-24 ENCOUNTER — Ambulatory Visit (INDEPENDENT_AMBULATORY_CARE_PROVIDER_SITE_OTHER): Payer: Medicare Other | Admitting: *Deleted

## 2012-04-24 DIAGNOSIS — Z7901 Long term (current) use of anticoagulants: Secondary | ICD-10-CM

## 2012-04-24 DIAGNOSIS — I4891 Unspecified atrial fibrillation: Secondary | ICD-10-CM

## 2012-04-24 LAB — POCT INR: INR: 1.9

## 2012-05-22 ENCOUNTER — Ambulatory Visit (INDEPENDENT_AMBULATORY_CARE_PROVIDER_SITE_OTHER): Payer: Medicare Other | Admitting: *Deleted

## 2012-05-22 DIAGNOSIS — Z7901 Long term (current) use of anticoagulants: Secondary | ICD-10-CM

## 2012-05-22 DIAGNOSIS — I4891 Unspecified atrial fibrillation: Secondary | ICD-10-CM

## 2012-05-22 LAB — POCT INR: INR: 1.8

## 2012-05-30 ENCOUNTER — Ambulatory Visit (INDEPENDENT_AMBULATORY_CARE_PROVIDER_SITE_OTHER): Payer: Medicare Other | Admitting: *Deleted

## 2012-05-30 ENCOUNTER — Encounter: Payer: Self-pay | Admitting: Internal Medicine

## 2012-05-30 DIAGNOSIS — I495 Sick sinus syndrome: Secondary | ICD-10-CM

## 2012-05-30 LAB — PACEMAKER DEVICE OBSERVATION
AL AMPLITUDE: 0.2 mv
BRDY-0002RV: 60 {beats}/min
DEVICE MODEL PM: 1683162
RV LEAD AMPLITUDE: 1.9 mv
RV LEAD THRESHOLD: 0.375 V
VENTRICULAR PACING PM: 12

## 2012-05-30 NOTE — Progress Notes (Signed)
PPM check 

## 2012-06-16 ENCOUNTER — Ambulatory Visit (INDEPENDENT_AMBULATORY_CARE_PROVIDER_SITE_OTHER): Payer: Medicare Other | Admitting: *Deleted

## 2012-06-16 DIAGNOSIS — I4891 Unspecified atrial fibrillation: Secondary | ICD-10-CM

## 2012-06-16 DIAGNOSIS — Z7901 Long term (current) use of anticoagulants: Secondary | ICD-10-CM

## 2012-07-15 ENCOUNTER — Other Ambulatory Visit: Payer: Self-pay | Admitting: Cardiology

## 2012-07-15 ENCOUNTER — Other Ambulatory Visit: Payer: Self-pay | Admitting: *Deleted

## 2012-07-21 ENCOUNTER — Ambulatory Visit (INDEPENDENT_AMBULATORY_CARE_PROVIDER_SITE_OTHER): Payer: Medicare Other | Admitting: *Deleted

## 2012-07-21 DIAGNOSIS — Z7901 Long term (current) use of anticoagulants: Secondary | ICD-10-CM

## 2012-07-21 DIAGNOSIS — I4891 Unspecified atrial fibrillation: Secondary | ICD-10-CM

## 2012-07-21 LAB — POCT INR: INR: 3.4

## 2012-08-18 ENCOUNTER — Ambulatory Visit (INDEPENDENT_AMBULATORY_CARE_PROVIDER_SITE_OTHER): Payer: Medicare Other | Admitting: *Deleted

## 2012-08-18 DIAGNOSIS — I4891 Unspecified atrial fibrillation: Secondary | ICD-10-CM

## 2012-08-18 DIAGNOSIS — Z7901 Long term (current) use of anticoagulants: Secondary | ICD-10-CM

## 2012-08-18 LAB — POCT INR: INR: 2.6

## 2012-09-15 ENCOUNTER — Ambulatory Visit (INDEPENDENT_AMBULATORY_CARE_PROVIDER_SITE_OTHER): Payer: Medicare Other | Admitting: *Deleted

## 2012-09-15 DIAGNOSIS — I4891 Unspecified atrial fibrillation: Secondary | ICD-10-CM

## 2012-09-15 DIAGNOSIS — Z7901 Long term (current) use of anticoagulants: Secondary | ICD-10-CM

## 2012-09-29 ENCOUNTER — Ambulatory Visit (INDEPENDENT_AMBULATORY_CARE_PROVIDER_SITE_OTHER): Payer: Medicare Other | Admitting: *Deleted

## 2012-09-29 DIAGNOSIS — Z7901 Long term (current) use of anticoagulants: Secondary | ICD-10-CM

## 2012-09-29 DIAGNOSIS — I4891 Unspecified atrial fibrillation: Secondary | ICD-10-CM

## 2012-10-03 ENCOUNTER — Other Ambulatory Visit: Payer: Self-pay | Admitting: *Deleted

## 2012-10-03 MED ORDER — METOPROLOL TARTRATE 25 MG PO TABS: 25.0000 mg | ORAL_TABLET | Freq: Two times a day (BID) | ORAL | Status: DC

## 2012-10-20 ENCOUNTER — Other Ambulatory Visit: Payer: Self-pay | Admitting: Cardiology

## 2012-10-20 ENCOUNTER — Ambulatory Visit (INDEPENDENT_AMBULATORY_CARE_PROVIDER_SITE_OTHER): Payer: Medicare Other | Admitting: *Deleted

## 2012-10-20 DIAGNOSIS — Z7901 Long term (current) use of anticoagulants: Secondary | ICD-10-CM

## 2012-10-20 DIAGNOSIS — I4891 Unspecified atrial fibrillation: Secondary | ICD-10-CM

## 2012-10-20 LAB — POCT INR: INR: 4

## 2012-10-20 MED ORDER — WARFARIN SODIUM 5 MG PO TABS: 5.0000 mg | ORAL_TABLET | Freq: Every day | ORAL | Status: DC

## 2012-11-03 ENCOUNTER — Ambulatory Visit (INDEPENDENT_AMBULATORY_CARE_PROVIDER_SITE_OTHER): Payer: Medicare Other | Admitting: *Deleted

## 2012-11-03 DIAGNOSIS — I4891 Unspecified atrial fibrillation: Secondary | ICD-10-CM

## 2012-11-03 DIAGNOSIS — Z7901 Long term (current) use of anticoagulants: Secondary | ICD-10-CM

## 2012-11-20 ENCOUNTER — Ambulatory Visit (INDEPENDENT_AMBULATORY_CARE_PROVIDER_SITE_OTHER): Payer: Medicare Other | Admitting: *Deleted

## 2012-11-20 DIAGNOSIS — Z7901 Long term (current) use of anticoagulants: Secondary | ICD-10-CM

## 2012-11-20 DIAGNOSIS — I4891 Unspecified atrial fibrillation: Secondary | ICD-10-CM

## 2012-12-12 ENCOUNTER — Encounter: Payer: Self-pay | Admitting: Internal Medicine

## 2012-12-12 ENCOUNTER — Ambulatory Visit (INDEPENDENT_AMBULATORY_CARE_PROVIDER_SITE_OTHER): Payer: Medicare Other | Admitting: Internal Medicine

## 2012-12-12 VITALS — BP 125/76 | HR 73 | Ht 65.5 in | Wt 176.4 lb

## 2012-12-12 DIAGNOSIS — I10 Essential (primary) hypertension: Secondary | ICD-10-CM

## 2012-12-12 DIAGNOSIS — I495 Sick sinus syndrome: Secondary | ICD-10-CM

## 2012-12-12 DIAGNOSIS — M129 Arthropathy, unspecified: Secondary | ICD-10-CM

## 2012-12-12 DIAGNOSIS — I4891 Unspecified atrial fibrillation: Secondary | ICD-10-CM

## 2012-12-12 LAB — PACEMAKER DEVICE OBSERVATION
AL AMPLITUDE: 0.1 mv
ATRIAL PACING PM: 1
BRDY-0002RV: 60 {beats}/min
BRDY-0004RV: 110 {beats}/min
DEVICE MODEL PM: 1683162
VENTRICULAR PACING PM: 17

## 2012-12-12 NOTE — Assessment & Plan Note (Signed)
Unfortunately, advanced age and inactivity have resulted in her developing fairly marked weakness. I've encouraged the patient to increase her physical activity. No change in medications today. I discussed the importance of walking every day.

## 2012-12-12 NOTE — Assessment & Plan Note (Signed)
Her blood pressure today is well controlled. She will continue her current medical therapy. 

## 2012-12-12 NOTE — Patient Instructions (Signed)
Your physician recommends that you schedule a follow-up appointment in:  1 - 1 YEAR WITH DR Rodman Key 2 - 6 MONTHS WITH PAULA

## 2012-12-12 NOTE — Assessment & Plan Note (Signed)
She is now persistently in atrial fibrillation. Her ventricular rates appear to be fairly well-controlled. No change in medical therapy today.

## 2012-12-12 NOTE — Progress Notes (Signed)
HPI [name] returns today for followup. She is a very pleasant 79 year old woman with symptomatic tachycardia bradycardia syndrome, atrial fibrillation, hypertension, on chronic Coumadin therapy. In the interim, she describes a fall where she was unable to get up for nearly 7 hours. She admits to weakness. She has severe arthritis in her lumbar sacral spine and is thought that much of her weakness is related to prior back surgeries and arthritis. She denies palpitations. No syncope. She still has chronic back pain. No Known Allergies   Current Outpatient Prescriptions  Medication Sig Dispense Refill  . gabapentin (NEURONTIN) 100 MG capsule Take 100 mg by mouth 3 (three) times daily.      Marland Kitchen lisinopril (PRINIVIL,ZESTRIL) 40 MG tablet Take 1 tablet (40 mg total) by mouth daily.  90 tablet  2  . metoprolol tartrate (LOPRESSOR) 25 MG tablet Take 1 tablet (25 mg total) by mouth 2 (two) times daily.  90 tablet  1  . warfarin (COUMADIN) 5 MG tablet Take 1 tablet (5 mg total) by mouth daily.  30 tablet  2     Past Medical History  Diagnosis Date  . COPD (chronic obstructive pulmonary disease)   . Arthritis   . Hypertension   . Atrial fibrillation   . Sinoatrial node dysfunction   . Hip pain, left     ROS:   All systems reviewed and negative except as noted in the HPI.   Past Surgical History  Procedure Date  . Back surgery   . Hip arthoplasty     total  . Knee arthroplasty      Family History  Problem Relation Age of Onset  . Heart disease Mother   . Prostate cancer Father   . Heart failure Father      History   Social History  . Marital Status: Widowed    Spouse Name: N/A    Number of Children: N/A  . Years of Education: N/A   Occupational History  . Retired    Social History Main Topics  . Smoking status: Never Smoker   . Smokeless tobacco: Not on file  . Alcohol Use: No  . Drug Use: No  . Sexually Active:    Other Topics Concern  . Not on file   Social  History Narrative  . No narrative on file     BP 125/76  Pulse 73  Ht 5' 5.5" (1.664 m)  Wt 176 lb 6.4 oz (80.015 kg)  BMI 28.91 kg/m2  SpO2 96%  Physical Exam:  Well appearing elderly woman, NAD HEENT: Unremarkable Neck:  No JVD, no thyromegally Lungs:  Clear with no wheezes, rales, or rhonchi. HEART:  Regular rate rhythm, no murmurs, no rubs, no clicks Abd:  soft, positive bowel sounds, no organomegally, no rebound, no guarding Ext:  2 plus pulses, no edema, no cyanosis, no clubbing Skin:  No rashes no nodules Neuro:  CN II through XII intact, motor grossly intact   DEVICE  Normal device function.  See PaceArt for details. Underlying rhythm is atrial fibrillation  Assess/Plan:

## 2012-12-20 ENCOUNTER — Other Ambulatory Visit: Payer: Self-pay | Admitting: Cardiology

## 2012-12-22 ENCOUNTER — Ambulatory Visit (INDEPENDENT_AMBULATORY_CARE_PROVIDER_SITE_OTHER): Payer: Medicare Other | Admitting: *Deleted

## 2012-12-22 ENCOUNTER — Other Ambulatory Visit: Payer: Self-pay | Admitting: Cardiology

## 2012-12-22 DIAGNOSIS — Z7901 Long term (current) use of anticoagulants: Secondary | ICD-10-CM

## 2012-12-22 DIAGNOSIS — I4891 Unspecified atrial fibrillation: Secondary | ICD-10-CM

## 2013-01-12 ENCOUNTER — Other Ambulatory Visit: Payer: Self-pay | Admitting: Cardiology

## 2013-01-19 ENCOUNTER — Ambulatory Visit (INDEPENDENT_AMBULATORY_CARE_PROVIDER_SITE_OTHER): Payer: Medicare Other | Admitting: *Deleted

## 2013-01-19 DIAGNOSIS — I4891 Unspecified atrial fibrillation: Secondary | ICD-10-CM

## 2013-01-19 DIAGNOSIS — Z7901 Long term (current) use of anticoagulants: Secondary | ICD-10-CM

## 2013-01-19 LAB — POCT INR: INR: 2.3

## 2013-02-18 ENCOUNTER — Other Ambulatory Visit: Payer: Self-pay | Admitting: Cardiology

## 2013-03-02 ENCOUNTER — Encounter: Payer: Self-pay | Admitting: *Deleted

## 2013-03-02 ENCOUNTER — Ambulatory Visit (INDEPENDENT_AMBULATORY_CARE_PROVIDER_SITE_OTHER): Payer: Medicare Other | Admitting: *Deleted

## 2013-03-02 DIAGNOSIS — I4891 Unspecified atrial fibrillation: Secondary | ICD-10-CM

## 2013-03-02 DIAGNOSIS — Z7901 Long term (current) use of anticoagulants: Secondary | ICD-10-CM

## 2013-03-02 LAB — POCT INR: INR: 2

## 2013-04-09 ENCOUNTER — Other Ambulatory Visit: Payer: Self-pay | Admitting: Cardiology

## 2013-04-13 ENCOUNTER — Ambulatory Visit (INDEPENDENT_AMBULATORY_CARE_PROVIDER_SITE_OTHER): Payer: Medicare Other | Admitting: *Deleted

## 2013-04-13 DIAGNOSIS — I4891 Unspecified atrial fibrillation: Secondary | ICD-10-CM

## 2013-04-13 DIAGNOSIS — Z7901 Long term (current) use of anticoagulants: Secondary | ICD-10-CM

## 2013-04-13 LAB — POCT INR: INR: 1.9

## 2013-05-11 ENCOUNTER — Ambulatory Visit (INDEPENDENT_AMBULATORY_CARE_PROVIDER_SITE_OTHER): Payer: Medicare Other | Admitting: *Deleted

## 2013-05-11 DIAGNOSIS — I4891 Unspecified atrial fibrillation: Secondary | ICD-10-CM

## 2013-05-11 DIAGNOSIS — Z7901 Long term (current) use of anticoagulants: Secondary | ICD-10-CM

## 2013-05-29 ENCOUNTER — Encounter: Payer: Self-pay | Admitting: Internal Medicine

## 2013-05-29 ENCOUNTER — Ambulatory Visit (INDEPENDENT_AMBULATORY_CARE_PROVIDER_SITE_OTHER): Payer: Medicare Other | Admitting: *Deleted

## 2013-05-29 DIAGNOSIS — I495 Sick sinus syndrome: Secondary | ICD-10-CM

## 2013-05-29 LAB — PACEMAKER DEVICE OBSERVATION
AL IMPEDENCE PM: 495 Ohm
ATRIAL PACING PM: 1
BAMS-0001: 130 {beats}/min
BRDY-0002RV: 60 {beats}/min
RV LEAD AMPLITUDE: 11.5 mv
VENTRICULAR PACING PM: 24

## 2013-05-29 NOTE — Progress Notes (Signed)
PPM check in office. 

## 2013-06-04 ENCOUNTER — Other Ambulatory Visit: Payer: Self-pay | Admitting: *Deleted

## 2013-06-04 NOTE — Telephone Encounter (Signed)
rx refill

## 2013-06-05 MED ORDER — WARFARIN SODIUM 5 MG PO TABS: ORAL_TABLET | ORAL | Status: DC

## 2013-06-08 ENCOUNTER — Ambulatory Visit (INDEPENDENT_AMBULATORY_CARE_PROVIDER_SITE_OTHER): Payer: Medicare Other | Admitting: *Deleted

## 2013-06-08 DIAGNOSIS — Z7901 Long term (current) use of anticoagulants: Secondary | ICD-10-CM

## 2013-06-08 DIAGNOSIS — I4891 Unspecified atrial fibrillation: Secondary | ICD-10-CM

## 2013-07-06 ENCOUNTER — Ambulatory Visit (INDEPENDENT_AMBULATORY_CARE_PROVIDER_SITE_OTHER): Payer: Medicare Other | Admitting: *Deleted

## 2013-07-06 DIAGNOSIS — Z7901 Long term (current) use of anticoagulants: Secondary | ICD-10-CM

## 2013-07-06 DIAGNOSIS — I4891 Unspecified atrial fibrillation: Secondary | ICD-10-CM

## 2013-07-06 LAB — POCT INR: INR: 2.3

## 2013-07-07 ENCOUNTER — Other Ambulatory Visit: Payer: Self-pay | Admitting: Cardiology

## 2013-07-10 ENCOUNTER — Other Ambulatory Visit: Payer: Self-pay | Admitting: Cardiology

## 2013-08-03 ENCOUNTER — Ambulatory Visit (INDEPENDENT_AMBULATORY_CARE_PROVIDER_SITE_OTHER): Payer: Medicare Other | Admitting: *Deleted

## 2013-08-03 DIAGNOSIS — I4891 Unspecified atrial fibrillation: Secondary | ICD-10-CM

## 2013-08-03 DIAGNOSIS — Z7901 Long term (current) use of anticoagulants: Secondary | ICD-10-CM

## 2013-08-03 LAB — POCT INR: INR: 2.8

## 2013-09-13 ENCOUNTER — Other Ambulatory Visit: Payer: Self-pay | Admitting: Cardiology

## 2013-09-14 ENCOUNTER — Ambulatory Visit (INDEPENDENT_AMBULATORY_CARE_PROVIDER_SITE_OTHER): Payer: Medicare Other | Admitting: *Deleted

## 2013-09-14 DIAGNOSIS — I4891 Unspecified atrial fibrillation: Secondary | ICD-10-CM

## 2013-09-14 DIAGNOSIS — Z7901 Long term (current) use of anticoagulants: Secondary | ICD-10-CM

## 2013-09-14 LAB — POCT INR: INR: 2.3

## 2013-10-07 ENCOUNTER — Other Ambulatory Visit: Payer: Self-pay | Admitting: Cardiology

## 2013-10-26 ENCOUNTER — Ambulatory Visit (INDEPENDENT_AMBULATORY_CARE_PROVIDER_SITE_OTHER): Payer: Medicare Other | Admitting: *Deleted

## 2013-10-26 DIAGNOSIS — I4891 Unspecified atrial fibrillation: Secondary | ICD-10-CM

## 2013-10-26 DIAGNOSIS — Z7901 Long term (current) use of anticoagulants: Secondary | ICD-10-CM

## 2013-11-10 ENCOUNTER — Other Ambulatory Visit: Payer: Self-pay | Admitting: Internal Medicine

## 2013-11-10 ENCOUNTER — Other Ambulatory Visit: Payer: Self-pay | Admitting: Cardiology

## 2013-11-19 ENCOUNTER — Ambulatory Visit (INDEPENDENT_AMBULATORY_CARE_PROVIDER_SITE_OTHER): Payer: Medicare Other | Admitting: *Deleted

## 2013-11-19 DIAGNOSIS — Z7901 Long term (current) use of anticoagulants: Secondary | ICD-10-CM

## 2013-11-19 DIAGNOSIS — I4891 Unspecified atrial fibrillation: Secondary | ICD-10-CM

## 2013-11-19 LAB — POCT INR: INR: 1.9

## 2013-12-10 ENCOUNTER — Other Ambulatory Visit: Payer: Self-pay | Admitting: Internal Medicine

## 2013-12-11 ENCOUNTER — Ambulatory Visit (INDEPENDENT_AMBULATORY_CARE_PROVIDER_SITE_OTHER): Payer: Medicare Other | Admitting: Obstetrics and Gynecology

## 2013-12-11 ENCOUNTER — Other Ambulatory Visit (HOSPITAL_COMMUNITY)
Admission: RE | Admit: 2013-12-11 | Discharge: 2013-12-11 | Disposition: A | Discharge status: Home / Self Care | Payer: Medicare Other | Source: Ambulatory Visit | Attending: Obstetrics and Gynecology | Admitting: Obstetrics and Gynecology

## 2013-12-11 ENCOUNTER — Encounter: Payer: Self-pay | Admitting: Obstetrics and Gynecology

## 2013-12-11 ENCOUNTER — Telehealth: Payer: Self-pay | Admitting: Adult Health

## 2013-12-11 VITALS — BP 100/70 | Ht 65.0 in | Wt 179.5 lb

## 2013-12-11 DIAGNOSIS — Z124 Encounter for screening for malignant neoplasm of cervix: Secondary | ICD-10-CM

## 2013-12-11 DIAGNOSIS — Z90722 Acquired absence of ovaries, bilateral: Secondary | ICD-10-CM

## 2013-12-11 DIAGNOSIS — IMO0002 Reserved for concepts with insufficient information to code with codable children: Secondary | ICD-10-CM | POA: Insufficient documentation

## 2013-12-11 DIAGNOSIS — Z9079 Acquired absence of other genital organ(s): Secondary | ICD-10-CM

## 2013-12-11 DIAGNOSIS — Z1151 Encounter for screening for human papillomavirus (HPV): Secondary | ICD-10-CM | POA: Insufficient documentation

## 2013-12-11 DIAGNOSIS — R87619 Unspecified abnormal cytological findings in specimens from cervix uteri: Secondary | ICD-10-CM | POA: Insufficient documentation

## 2013-12-11 DIAGNOSIS — Z01419 Encounter for gynecological examination (general) (routine) without abnormal findings: Secondary | ICD-10-CM

## 2013-12-11 MED ORDER — METOPROLOL TARTRATE 25 MG PO TABS: 25.0000 mg | ORAL_TABLET | Freq: Two times a day (BID) | ORAL | Status: DC

## 2013-12-11 NOTE — Progress Notes (Signed)
This chart was scribed by Bennett Scrapehristina Taylor, Medical Scribe, for Dr. Christin BachJohn Charde Macfarlane on 12/11/13 at 11:57 AM. This chart was reviewed by Dr. Christin BachJohn Bronislaus Verdell and is accurate.   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 pap smear done, next pap due 1 year 2. return annually or prn 3    Annual mammogram advised Subjective:  [name] is a 80 y.o. female with toal hysterectomy and bilateral salpingo-oopherectomy history on file. who presents for annual exam. No LMP recorded. Patient is postmenopausal. The patient has no complaints today. Prior polyp removal  The following portions of the patient's history were reviewed and updated as appropriate: allergies, current medications, past family history, past medical history, past social history, past surgical history and problem list.  Review of Systems Constitutional: negative Gastrointestinal: negative Genitourinary: negative  Objective:  BP 100/70  Ht 5\' 5"  (1.651 m)  Wt 179 lb 8 oz (81.421 kg)  BMI 29.87 kg/m2   BMI: Body mass index is 29.87 kg/(m^2).  General Appearance: Alert, appropriate appearance for age. No acute distress HEENT: Grossly normal Neck / Thyroid:  Cardiovascular: RRR; normal S1, S2, no murmur Lungs: CTA bilaterally Back: No CVAT Breast Exam: No performed Gastrointestinal: Soft, non-tender, no masses or organomegaly Pelvic Exam: Vulva and vagina appear normal. Bimanual exam reveals normal uterus and adnexa. Rectovaginal: normal rectal, no masses and guaiac negative stool obtained Lymphatic Exam: Non-palpable nodes in neck, clavicular, axillary, or inguinal regions  Skin: no rash or abnormalities Neurologic: Normal gait and speech, no tremor  Psychiatric: Alert and oriented, appropriate affect.  Urinalysis:not done and Not done  Christin BachJohn Tra Wilemon. MD Pgr [phone] 11:56 AM

## 2013-12-11 NOTE — Telephone Encounter (Signed)
Needs refill on Metoprolol sent to [facility] / tgs

## 2013-12-11 NOTE — Telephone Encounter (Signed)
Noted pt has not seen a cardiologist since 2013 with [facility], pt has seen Dr. Lewayne BuntingGregg Taylor [phone] and has upcoming apt with Dr. Lewayne BuntingGregg Taylor 12-2013, left a vm for the pt to call our office to reschedule a follow up apt with Dr Wyline MoodBranch or Dr. Purvis SheffieldKoneswaran, rx sent to pharmacy by e-script For one month to allow pt time to call office to schedule apt, also placed notation in rx refill to advise pt needs to call office to schedule apt

## 2013-12-21 ENCOUNTER — Encounter: Payer: Self-pay | Admitting: Obstetrics and Gynecology

## 2013-12-23 ENCOUNTER — Ambulatory Visit (INDEPENDENT_AMBULATORY_CARE_PROVIDER_SITE_OTHER): Payer: Medicare Other | Admitting: *Deleted

## 2013-12-23 ENCOUNTER — Encounter: Payer: Self-pay | Admitting: Cardiology

## 2013-12-23 ENCOUNTER — Ambulatory Visit (INDEPENDENT_AMBULATORY_CARE_PROVIDER_SITE_OTHER): Payer: Medicare Other | Admitting: Cardiology

## 2013-12-23 VITALS — BP 118/60 | HR 62 | Ht 65.0 in | Wt 168.0 lb

## 2013-12-23 DIAGNOSIS — I4891 Unspecified atrial fibrillation: Secondary | ICD-10-CM

## 2013-12-23 DIAGNOSIS — I495 Sick sinus syndrome: Secondary | ICD-10-CM

## 2013-12-23 DIAGNOSIS — Z7901 Long term (current) use of anticoagulants: Secondary | ICD-10-CM

## 2013-12-23 DIAGNOSIS — I10 Essential (primary) hypertension: Secondary | ICD-10-CM

## 2013-12-23 LAB — POCT INR: INR: 2

## 2013-12-23 NOTE — Progress Notes (Addendum)
Clinical Summary [name] is a 80 y.o.female former patient of Dr Daleen Squibb, this is our first visit together. She was seen for the following medical problems.  1. Afib - compliant with coumadin, denies any bleeding problems - denies any significant palpitations  2. Tachy-Brady Syndrome - has permanent pacemaker followed by EP Dr Ladona Ridgel - denies any lightheadedness or dizziness  3. HTN - compliant with meds - does not check at home regularly.   Past Medical History  Diagnosis Date  . COPD (chronic obstructive pulmonary disease)   . Arthritis   . Hypertension   . Atrial fibrillation   . Sinoatrial node dysfunction   . Hip pain, left      No Known Allergies   Current Outpatient Prescriptions  Medication Sig Dispense Refill  . lisinopril (PRINIVIL,ZESTRIL) 40 MG tablet TAKE 1 TABLET EVERY DAY  90 tablet  2  . metoprolol tartrate (LOPRESSOR) 25 MG tablet Take 1 tablet (25 mg total) by mouth 2 (two) times daily.  60 tablet  0  . Multiple Vitamin (MULTIVITAMIN) tablet Take 1 tablet by mouth daily.      Marland Kitchen warfarin (COUMADIN) 5 MG tablet TAKE 1 TABLET AS DIRECTED  30 tablet  3   No current facility-administered medications for this visit.     Past Surgical History  Procedure Laterality Date  . Back surgery    . Hip arthoplasty      total  . Knee arthroplasty    . Uterine polyp removal    . Eye surgery Bilateral     cataract removal     No Known Allergies    Family History  Problem Relation Age of Onset  . Prostate cancer Father   . Heart failure Father   . Heart disease Father   . Stroke Sister   . Depression Maternal Grandmother      Social History Ms. Mccumbers reports that she has never smoked. She has never used smokeless tobacco. Ms. Holsclaw reports that she does not drink alcohol.   Review of Systems CONSTITUTIONAL: No weight loss, fever, chills, weakness or fatigue.  HEENT: Eyes: No visual loss, blurred vision, double vision or yellow  sclerae.No hearing loss, sneezing, congestion, runny nose or sore throat.  SKIN: No rash or itching.  CARDIOVASCULAR: per HPI RESPIRATORY: No shortness of breath, cough or sputum.  GASTROINTESTINAL: No anorexia, nausea, vomiting or diarrhea. No abdominal pain or blood.  GENITOURINARY: No burning on urination, no polyuria NEUROLOGICAL: No headache, dizziness, syncope, paralysis, ataxia, numbness or tingling in the extremities. No change in bowel or bladder control.  MUSCULOSKELETAL: No muscle, back pain, joint pain or stiffness.  LYMPHATICS: No enlarged nodes. No history of splenectomy.  PSYCHIATRIC: No history of depression or anxiety.  ENDOCRINOLOGIC: No reports of sweating, cold or heat intolerance. No polyuria or polydipsia.  Marland Kitchen   Physical Examination p 62 bp 118/60 Wt 168 lbs BMI 28 Gen: resting comfortably, no acute distress HEENT: no scleral icterus, pupils equal round and reactive, no palptable cervical adenopathy,  CV: irreg, no m/r/g, no JVD, no carotid bruits Resp: Clear to auscultation bilaterally GI: abdomen is soft, non-tender, non-distended, normal bowel sounds, no hepatosplenomegaly MSK: extremities are warm, no edema.  Skin: warm, no rash Neuro:  no focal deficits Psych: appropriate affect   Clinic EKG 12/23/13 Afib, V-paced  Assessment and Plan  1. Afib - no current symptoms - continue current medical therapy and anticoagulation  2. Tachy-brady syndrome - no current symptoms, continue regular follow  up with EP clinic  3. HTN - at goal, continue current meds    Antoine PocheJonathan F. Branch, M.D., F.A.C.C.

## 2013-12-23 NOTE — Patient Instructions (Signed)
Your physician wants you to follow-up in:  6 months. You will receive a reminder letter in the mail two months in advance. If you don't receive a letter, please call our office to schedule the follow-up appointment.   

## 2014-01-01 ENCOUNTER — Encounter: Payer: Medicare Other | Admitting: Internal Medicine

## 2014-01-13 ENCOUNTER — Other Ambulatory Visit: Payer: Self-pay | Admitting: Internal Medicine

## 2014-01-20 ENCOUNTER — Ambulatory Visit (INDEPENDENT_AMBULATORY_CARE_PROVIDER_SITE_OTHER): Payer: Medicare Other | Admitting: *Deleted

## 2014-01-20 DIAGNOSIS — I4891 Unspecified atrial fibrillation: Secondary | ICD-10-CM

## 2014-01-20 DIAGNOSIS — Z5181 Encounter for therapeutic drug level monitoring: Secondary | ICD-10-CM | POA: Insufficient documentation

## 2014-01-20 DIAGNOSIS — Z7901 Long term (current) use of anticoagulants: Secondary | ICD-10-CM

## 2014-01-20 LAB — POCT INR: INR: 2.1

## 2014-02-08 ENCOUNTER — Ambulatory Visit (INDEPENDENT_AMBULATORY_CARE_PROVIDER_SITE_OTHER): Payer: Medicare Other | Admitting: Internal Medicine

## 2014-02-08 ENCOUNTER — Encounter: Payer: Self-pay | Admitting: Internal Medicine

## 2014-02-08 VITALS — BP 152/71 | HR 58 | Ht 65.0 in | Wt 175.0 lb

## 2014-02-08 DIAGNOSIS — I4891 Unspecified atrial fibrillation: Secondary | ICD-10-CM

## 2014-02-08 DIAGNOSIS — I10 Essential (primary) hypertension: Secondary | ICD-10-CM

## 2014-02-08 DIAGNOSIS — Z950 Presence of cardiac pacemaker: Secondary | ICD-10-CM

## 2014-02-08 LAB — MDC_IDC_ENUM_SESS_TYPE_INCLINIC
Battery Impedance: 1800 Ohm
Battery Voltage: 2.75 V
Brady Statistic RA Percent Paced: 1 %
Date Time Interrogation Session: 20150330113654
Implantable Pulse Generator Serial Number: 1683162
Lead Channel Impedance Value: 444 Ohm
Lead Channel Pacing Threshold Amplitude: 0.375 V
Lead Channel Pacing Threshold Pulse Width: 0.5 ms
Lead Channel Setting Pacing Amplitude: 2 V
Lead Channel Setting Pacing Pulse Width: 0.5 ms
Lead Channel Setting Sensing Sensitivity: 2 mV
MDC IDC MSMT LEADCHNL RA IMPEDANCE VALUE: 489 Ohm
MDC IDC MSMT LEADCHNL RA SENSING INTR AMPL: 0.1 mV
MDC IDC MSMT LEADCHNL RV SENSING INTR AMPL: 11.9 mV
MDC IDC STAT BRADY RV PERCENT PACED: 32 %

## 2014-02-08 NOTE — Assessment & Plan Note (Signed)
Her blood pressure is elevated but with her propensity to fall, I would like it not to be low. Will follow.

## 2014-02-08 NOTE — Assessment & Plan Note (Signed)
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 Will recheck in several months.  

## 2014-02-08 NOTE — Progress Notes (Signed)
HPI [name] returns today for followup. She is a very pleasant 80 year old woman with symptomatic tachycardia bradycardia syndrome, atrial fibrillation, hypertension, on chronic Coumadin therapy. In the interim, she has not fallen. She admits to weakness. She has severe arthritis in her lumbar sacral spine and is thought that much of her weakness is related to prior back surgeries and arthritis. She uses a walker now for ambulation. She denies palpitations. No syncope. She still has chronic back pain. No Known Allergies   Current Outpatient Prescriptions  Medication Sig Dispense Refill  . lisinopril (PRINIVIL,ZESTRIL) 40 MG tablet TAKE 1 TABLET EVERY DAY  90 tablet  2  . metoprolol tartrate (LOPRESSOR) 25 MG tablet TAKE 1 TABLET (25 MG TOTAL) BY MOUTH 1 (once) a day      . Multiple Vitamin (MULTIVITAMIN) tablet Take 1 tablet by mouth daily.      Marland Kitchen. warfarin (COUMADIN) 5 MG tablet TAKE 1 TABLET AS DIRECTED  30 tablet  3   No current facility-administered medications for this visit.     Past Medical History  Diagnosis Date  . COPD (chronic obstructive pulmonary disease)   . Arthritis   . Hypertension   . Atrial fibrillation   . Sinoatrial node dysfunction   . Hip pain, left     ROS:   All systems reviewed and negative except as noted in the HPI.   Past Surgical History  Procedure Laterality Date  . Back surgery    . Hip arthoplasty      total  . Knee arthroplasty    . Uterine polyp removal    . Eye surgery Bilateral     cataract removal     Family History  Problem Relation Age of Onset  . Prostate cancer Father   . Heart failure Father   . Heart disease Father   . Stroke Sister   . Depression Maternal Grandmother      History   Social History  . Marital Status: Widowed    Spouse Name: N/A    Number of Children: N/A  . Years of Education: N/A   Occupational History  . Retired    Social History Main Topics  . Smoking status: Never Smoker   . Smokeless  tobacco: Never Used  . Alcohol Use: No  . Drug Use: No  . Sexual Activity: Not Currently    Birth Control/ Protection: Post-menopausal   Other Topics Concern  . Not on file   Social History Narrative  . No narrative on file     BP 152/71  Pulse 58  Ht 5\' 5"  (1.651 m)  Wt 175 lb (79.379 kg)  BMI 29.12 kg/m2  SpO2 97%  Physical Exam:  Well appearing elderly woman, NAD HEENT: Unremarkable Neck:  No JVD, no thyromegally Lungs:  Clear with no wheezes, rales, or rhonchi. HEART:  IRegular rate rhythm, no murmurs, no rubs, no clicks Abd:  soft, positive bowel sounds, no organomegally, no rebound, no guarding Ext:  2 plus pulses, no edema, no cyanosis, no clubbing Skin:  No rashes no nodules Neuro:  CN II through XII intact, motor grossly intact   DEVICE  Normal device function.  See PaceArt for details. Underlying rhythm is atrial fibrillation  Assess/Plan:

## 2014-02-08 NOTE — Assessment & Plan Note (Signed)
She is mostly maintaining NSR. She will continue her current meds.

## 2014-02-08 NOTE — Patient Instructions (Signed)
Your physician recommends that you schedule a follow-up appointment in: 6 months with device clinic and 12 months with Dr Taylor You will receive a reminder letter two months in advance reminding you to call and schedule your appointment. If you don't receive this letter, please contact our office.  Your physician recommends that you continue on your current medications as directed. Please refer to the Current Medication list given to you today.   

## 2014-02-17 ENCOUNTER — Ambulatory Visit (INDEPENDENT_AMBULATORY_CARE_PROVIDER_SITE_OTHER): Payer: Medicare Other | Admitting: *Deleted

## 2014-02-17 DIAGNOSIS — Z5181 Encounter for therapeutic drug level monitoring: Secondary | ICD-10-CM

## 2014-02-17 DIAGNOSIS — I4891 Unspecified atrial fibrillation: Secondary | ICD-10-CM

## 2014-02-17 DIAGNOSIS — Z7901 Long term (current) use of anticoagulants: Secondary | ICD-10-CM

## 2014-02-17 LAB — POCT INR: INR: 2.1

## 2014-02-27 ENCOUNTER — Other Ambulatory Visit: Payer: Self-pay | Admitting: Cardiology

## 2014-03-09 ENCOUNTER — Other Ambulatory Visit: Payer: Self-pay

## 2014-03-09 DIAGNOSIS — I10 Essential (primary) hypertension: Secondary | ICD-10-CM

## 2014-03-09 MED ORDER — METOPROLOL TARTRATE 25 MG PO TABS: ORAL_TABLET | ORAL | Status: DC

## 2014-03-10 ENCOUNTER — Other Ambulatory Visit: Payer: Self-pay

## 2014-03-10 DIAGNOSIS — I10 Essential (primary) hypertension: Secondary | ICD-10-CM

## 2014-03-10 MED ORDER — METOPROLOL TARTRATE 25 MG PO TABS: ORAL_TABLET | ORAL | Status: DC

## 2014-03-11 ENCOUNTER — Telehealth: Payer: Self-pay | Admitting: *Deleted

## 2014-03-11 ENCOUNTER — Other Ambulatory Visit: Payer: Self-pay

## 2014-03-11 DIAGNOSIS — I10 Essential (primary) hypertension: Secondary | ICD-10-CM

## 2014-03-11 MED ORDER — METOPROLOL TARTRATE 25 MG PO TABS: ORAL_TABLET | ORAL | Status: DC

## 2014-03-11 NOTE — Telephone Encounter (Signed)
Pt is calling to see if we changed her metoprolol she has always took two a day and the Rx she picked up states one a day

## 2014-03-11 NOTE — Telephone Encounter (Signed)
[place] pt, but R Gerilyn PilgrimJacob refilled it last please advise to pt what she is to take.

## 2014-04-08 ENCOUNTER — Other Ambulatory Visit: Payer: Self-pay | Admitting: *Deleted

## 2014-04-08 ENCOUNTER — Ambulatory Visit (INDEPENDENT_AMBULATORY_CARE_PROVIDER_SITE_OTHER): Payer: Medicare Other | Admitting: *Deleted

## 2014-04-08 DIAGNOSIS — I4891 Unspecified atrial fibrillation: Secondary | ICD-10-CM

## 2014-04-08 DIAGNOSIS — Z7901 Long term (current) use of anticoagulants: Secondary | ICD-10-CM

## 2014-04-08 DIAGNOSIS — Z5181 Encounter for therapeutic drug level monitoring: Secondary | ICD-10-CM

## 2014-04-08 LAB — POCT INR: INR: 2.3

## 2014-05-20 ENCOUNTER — Ambulatory Visit (INDEPENDENT_AMBULATORY_CARE_PROVIDER_SITE_OTHER): Payer: Medicare Other | Admitting: *Deleted

## 2014-05-20 DIAGNOSIS — Z7901 Long term (current) use of anticoagulants: Secondary | ICD-10-CM

## 2014-05-20 DIAGNOSIS — Z5181 Encounter for therapeutic drug level monitoring: Secondary | ICD-10-CM

## 2014-05-20 DIAGNOSIS — I482 Chronic atrial fibrillation, unspecified: Secondary | ICD-10-CM

## 2014-05-20 DIAGNOSIS — I4891 Unspecified atrial fibrillation: Secondary | ICD-10-CM

## 2014-05-20 LAB — POCT INR: INR: 2.7

## 2014-05-25 ENCOUNTER — Inpatient Hospital Stay (HOSPITAL_COMMUNITY)
Admission: EM | Admit: 2014-05-25 | Discharge: 2014-05-26 | DRG: 534 | Disposition: A | Discharge status: Skilled Nursing Facility | Payer: Medicare Other | Attending: Pulmonary Disease | Admitting: Pulmonary Disease

## 2014-05-25 ENCOUNTER — Encounter (HOSPITAL_COMMUNITY): Payer: Self-pay | Admitting: Emergency Medicine

## 2014-05-25 ENCOUNTER — Emergency Department (HOSPITAL_COMMUNITY): Payer: Medicare Other

## 2014-05-25 DIAGNOSIS — M129 Arthropathy, unspecified: Secondary | ICD-10-CM | POA: Diagnosis present

## 2014-05-25 DIAGNOSIS — Z7901 Long term (current) use of anticoagulants: Secondary | ICD-10-CM

## 2014-05-25 DIAGNOSIS — Z8249 Family history of ischemic heart disease and other diseases of the circulatory system: Secondary | ICD-10-CM

## 2014-05-25 DIAGNOSIS — Z96649 Presence of unspecified artificial hip joint: Secondary | ICD-10-CM | POA: Diagnosis not present

## 2014-05-25 DIAGNOSIS — S72309A Unspecified fracture of shaft of unspecified femur, initial encounter for closed fracture: Principal | ICD-10-CM | POA: Diagnosis present

## 2014-05-25 DIAGNOSIS — S72009A Fracture of unspecified part of neck of unspecified femur, initial encounter for closed fracture: Secondary | ICD-10-CM

## 2014-05-25 DIAGNOSIS — J4489 Other specified chronic obstructive pulmonary disease: Secondary | ICD-10-CM | POA: Diagnosis present

## 2014-05-25 DIAGNOSIS — W010XXA Fall on same level from slipping, tripping and stumbling without subsequent striking against object, initial encounter: Secondary | ICD-10-CM | POA: Diagnosis present

## 2014-05-25 DIAGNOSIS — I10 Essential (primary) hypertension: Secondary | ICD-10-CM | POA: Diagnosis present

## 2014-05-25 DIAGNOSIS — Z823 Family history of stroke: Secondary | ICD-10-CM

## 2014-05-25 DIAGNOSIS — Z96659 Presence of unspecified artificial knee joint: Secondary | ICD-10-CM

## 2014-05-25 DIAGNOSIS — Y92009 Unspecified place in unspecified non-institutional (private) residence as the place of occurrence of the external cause: Secondary | ICD-10-CM | POA: Diagnosis not present

## 2014-05-25 DIAGNOSIS — I4891 Unspecified atrial fibrillation: Secondary | ICD-10-CM | POA: Diagnosis present

## 2014-05-25 DIAGNOSIS — Z950 Presence of cardiac pacemaker: Secondary | ICD-10-CM | POA: Diagnosis not present

## 2014-05-25 DIAGNOSIS — S72002A Fracture of unspecified part of neck of left femur, initial encounter for closed fracture: Secondary | ICD-10-CM

## 2014-05-25 DIAGNOSIS — M25559 Pain in unspecified hip: Secondary | ICD-10-CM | POA: Diagnosis present

## 2014-05-25 DIAGNOSIS — J449 Chronic obstructive pulmonary disease, unspecified: Secondary | ICD-10-CM | POA: Diagnosis present

## 2014-05-25 LAB — CBC WITH DIFFERENTIAL/PLATELET
Basophils Absolute: 0 10*3/uL (ref 0.0–0.1)
Basophils Relative: 0 % (ref 0–1)
EOS ABS: 0.1 10*3/uL (ref 0.0–0.7)
Eosinophils Relative: 1 % (ref 0–5)
HCT: 38.5 % (ref 36.0–46.0)
Hemoglobin: 12.7 g/dL (ref 12.0–15.0)
LYMPHS ABS: 1 10*3/uL (ref 0.7–4.0)
LYMPHS PCT: 7 % — AB (ref 12–46)
MCH: 33 pg (ref 26.0–34.0)
MCHC: 33 g/dL (ref 30.0–36.0)
MCV: 100 fL (ref 78.0–100.0)
Monocytes Absolute: 0.9 10*3/uL (ref 0.1–1.0)
Monocytes Relative: 7 % (ref 3–12)
NEUTROS PCT: 85 % — AB (ref 43–77)
Neutro Abs: 11.5 10*3/uL — ABNORMAL HIGH (ref 1.7–7.7)
PLATELETS: 212 10*3/uL (ref 150–400)
RBC: 3.85 MIL/uL — AB (ref 3.87–5.11)
RDW: 15.5 % (ref 11.5–15.5)
WBC: 13.5 10*3/uL — AB (ref 4.0–10.5)

## 2014-05-25 LAB — BASIC METABOLIC PANEL
Anion gap: 10 (ref 5–15)
BUN: 12 mg/dL (ref 6–23)
CO2: 27 meq/L (ref 19–32)
Calcium: 9 mg/dL (ref 8.4–10.5)
Chloride: 102 mEq/L (ref 96–112)
Creatinine, Ser: 0.58 mg/dL (ref 0.50–1.10)
GFR calc Af Amer: >90 mL/min (ref >90)
GFR, EST NON AFRICAN AMERICAN: 79 mL/min — AB (ref >90)
GLUCOSE: 120 mg/dL — AB (ref 70–99)
POTASSIUM: 4.4 meq/L (ref 3.7–5.3)
Sodium: 139 mEq/L (ref 137–147)

## 2014-05-25 LAB — PROTIME-INR
INR: 2.07 — ABNORMAL HIGH (ref 0.00–1.49)
Prothrombin Time: 23.3 seconds — ABNORMAL HIGH (ref 11.6–15.2)

## 2014-05-25 MED ORDER — LISINOPRIL 10 MG PO TABS: 40.0000 mg | ORAL_TABLET | Freq: Every day | ORAL | Status: DC

## 2014-05-25 MED ORDER — WARFARIN SODIUM 5 MG PO TABS
ORAL_TABLET | ORAL | Status: AC
  Filled 2014-05-25: qty 1

## 2014-05-25 MED ORDER — OCUVITE PO TABS
1.0000 | ORAL_TABLET | Freq: Every day | ORAL | Status: DC
  Administered 2014-05-26: 1 via ORAL
  Filled 2014-05-25 (×4): qty 1

## 2014-05-25 MED ORDER — HYDROMORPHONE HCL PF 1 MG/ML IJ SOLN
1.0000 mg | Freq: Once | INTRAMUSCULAR | Status: AC
  Administered 2014-05-25: 1 mg via INTRAVENOUS
  Filled 2014-05-25: qty 1

## 2014-05-25 MED ORDER — ONDANSETRON HCL 4 MG/2ML IJ SOLN: 4.0000 mg | Freq: Four times a day (QID) | INTRAMUSCULAR | Status: DC | PRN

## 2014-05-25 MED ORDER — OXYCODONE HCL 5 MG PO TABS
5.0000 mg | ORAL_TABLET | ORAL | Status: DC | PRN
  Administered 2014-05-26 (×2): 5 mg via ORAL
  Filled 2014-05-25 (×2): qty 1

## 2014-05-25 MED ORDER — METOPROLOL TARTRATE 25 MG PO TABS
25.0000 mg | ORAL_TABLET | Freq: Two times a day (BID) | ORAL | Status: DC
  Administered 2014-05-25 – 2014-05-26 (×2): 25 mg via ORAL
  Filled 2014-05-25 (×2): qty 1

## 2014-05-25 MED ORDER — SODIUM CHLORIDE 0.9 % IV SOLN
INTRAVENOUS | Status: DC
  Administered 2014-05-25: 21:00:00 via INTRAVENOUS

## 2014-05-25 MED ORDER — LISINOPRIL 10 MG PO TABS
40.0000 mg | ORAL_TABLET | Freq: Every day | ORAL | Status: DC
  Administered 2014-05-26: 40 mg via ORAL
  Filled 2014-05-25 (×2): qty 4

## 2014-05-25 MED ORDER — WARFARIN SODIUM 5 MG PO TABS
5.0000 mg | ORAL_TABLET | Freq: Once | ORAL | Status: AC
  Administered 2014-05-25: 5 mg via ORAL
  Filled 2014-05-25 (×2): qty 1

## 2014-05-25 MED ORDER — ONDANSETRON HCL 4 MG PO TABS: 4.0000 mg | ORAL_TABLET | Freq: Four times a day (QID) | ORAL | Status: DC | PRN

## 2014-05-25 MED ORDER — ADULT MULTIVITAMIN W/MINERALS CH
1.0000 | ORAL_TABLET | Freq: Every day | ORAL | Status: DC
  Administered 2014-05-26: 1 via ORAL
  Filled 2014-05-25: qty 1

## 2014-05-25 MED ORDER — WARFARIN - PHARMACIST DOSING INPATIENT
Freq: Every day | Status: DC
  Administered 2014-05-26: 16:00:00

## 2014-05-25 MED ORDER — WARFARIN SODIUM 2.5 MG PO TABS: 2.5000 mg | ORAL_TABLET | ORAL | Status: DC

## 2014-05-25 NOTE — ED Provider Notes (Signed)
Patient signed out to me followup with orthopedics. Patient had suffered a fall, has a fracture of the right femur associated with right hip prosthesis. I did discuss the case with Doctor Alusio, on call for orthopedics. He has reviewed the images and reports that this is a nonoperative injury. Patient will be weightbearing as tolerated, can be seen in the office in 2 weeks for followup. I will therefore ask the hospitalist to admit her for pain management and physical therapy.  Gilda Creasehristopher J. Gerry Heaphy, MD 05/25/14 [phone]

## 2014-05-25 NOTE — ED Provider Notes (Signed)
CSN: [id number]     Arrival date & time 05/25/14  1102 History  This chart was scribed for Benny LennertJoseph L Verenis Nicosia, MD,  by Ashley JacobsBrittany Andrews, ED Scribe. The patient was seen in room APA03/APA03 and the patient's care was started at 11:56 AM.   First MD Initiated Contact with Patient 05/25/14 1148     Chief Complaint  Patient presents with  . Knee Injury     (Consider location/radiation/quality/duration/timing/severity/associated sxs/prior Treatment) Patient is a 80 y.o. female presenting with fall. The history is provided by the patient, a relative and medical records. No language interpreter was used.  Fall This is a new problem. The current episode started 1 to 2 hours ago. The problem occurs constantly. The problem has not changed since onset.The symptoms are aggravated by walking, twisting, exertion, swallowing and bending. Nothing relieves the symptoms. She has tried nothing for the symptoms.   HPI Comments: Adrienne Mochaempie H Schlueter is a 80 y.o. female who presents to the Emergency Department complaining of R knee and hip pain after falling today while walking in her car port. She landed on her right side onto the pavement.  Pt was unable to walk after the incident and had to call someone to take her the ED. The pain is exacerbated with lifting or moment of her knee.The severity of the right knee and hip pain is 10/10 with movement. Pt had right knee, hip, and back surgery. No ecchymosis or abrasions. Nothing seems to make the pain better. Denies LOC.    Past Medical History  Diagnosis Date  . COPD (chronic obstructive pulmonary disease)   . Arthritis   . Hypertension   . Atrial fibrillation   . Sinoatrial node dysfunction   . Hip pain, left    Past Surgical History  Procedure Laterality Date  . Back surgery    . Hip arthoplasty      total  . Knee arthroplasty    . Uterine polyp removal    . Eye surgery Bilateral     cataract removal   Family History  Problem Relation Age of Onset  .  Prostate cancer Father   . Heart failure Father   . Heart disease Father   . Stroke Sister   . Depression Maternal Grandmother    History  Substance Use Topics  . Smoking status: Never Smoker   . Smokeless tobacco: Never Used  . Alcohol Use: No   OB History   Grav Para Term Preterm Abortions TAB SAB Ect Mult Living                 Review of Systems  Musculoskeletal: Positive for arthralgias, gait problem and myalgias.  All other systems reviewed and are negative.     Allergies  Review of patient's allergies indicates no known allergies.  Home Medications   Prior to Admission medications   Medication Sig Start Date End Date Taking? Authorizing Provider  lisinopril (PRINIVIL,ZESTRIL) 40 MG tablet TAKE 1 TABLET EVERY DAY 09/13/13   Pricilla RifflePaula Ross V, MD  metoprolol tartrate (LOPRESSOR) 25 MG tablet TAKE 1 TABLET (25 MG TOTAL) BY MOUTH 2 TIMES A DAY 03/11/14   Antoine PocheJonathan F Branch, MD  Multiple Vitamin (MULTIVITAMIN) tablet Take 1 tablet by mouth daily.    Historical Provider, MD  warfarin (COUMADIN) 5 MG tablet TAKE 1 TABLET AS DIRECTED    Antoine PocheJonathan F Branch, MD   BP 160/103  Pulse 74  Temp(Src) 97.6 F (36.4 C) (Oral)  Resp 16  Ht 5\' 5"  (  1.651 m)  Wt 169 lb (76.658 kg)  BMI 28.12 kg/m2  SpO2 99% Physical Exam  Constitutional: She is oriented to person, place, and time. She appears well-developed.  HENT:  Head: Normocephalic.  Eyes: Conjunctivae and EOM are normal. No scleral icterus.  Cardiovascular: Normal rate.   Pulmonary/Chest: She exhibits no tenderness.  Abdominal: There is no rebound.  Musculoskeletal: She exhibits tenderness. She exhibits no edema.  Tenderness of the right hip.  Lymphadenopathy:    She has no cervical adenopathy.  Neurological: She is oriented to person, place, and time. She exhibits normal muscle tone. Coordination normal.  Skin: No rash noted. No erythema.  Psychiatric: She has a normal mood and affect. Her behavior is normal.    ED Course   Procedures (including critical care time) DIAGNOSTIC STUDIES: Oxygen Saturation is 99% on room air, normal by my interpretation.    COORDINATION OF CARE:  12:01 PM Discussed course of care with pt . Pt understands and agrees.     Labs Review Labs Reviewed - No data to display  Imaging Review No results found.   EKG Interpretation None      MDM   Final diagnoses:  None    Femur fx.  Dr Berton Lan will call back after surgery  The chart was scribed for me under my direct supervision.  I personally performed the history, physical, and medical decision making and all procedures in the evaluation of this patient.Benny Lennert, MD 05/25/14 [phone]

## 2014-05-25 NOTE — H&P (Signed)
[facility] History and Physical  Yaslin Kirtley Cuffe ZOX:[id number] DOB: 01-Feb-1923 DOA: 05/25/2014  Referring physician: ER. PCP: Fredirick Maudlin, MD   Chief Complaint: Right hip pain.  HPI: [name] is a 80 y.o. female  This is a delightful 80 year old lady who had an accidental fall today. She is injured her right hip, at the site of the right hip prosthesis previously. She has sustained a nondisplaced fracture of the proximal medial right femoral diaphysis. She is now being admitted for further management.   Review of Systems:  Constitutional:  No weight loss, night sweats, Fevers, chills, fatigue.  HEENT:  No headaches, Difficulty swallowing,Tooth/dental problems,Sore throat,  No sneezing, itching, ear ache, nasal congestion, post nasal drip,  Cardio-vascular:  No chest pain, Orthopnea, PND, swelling in lower extremities, anasarca, dizziness, palpitations  GI:  No heartburn, indigestion, abdominal pain, nausea, vomiting, diarrhea, change in bowel habits, loss of appetite  Resp:  No shortness of breath with exertion or at rest. No excess mucus, no productive cough, No non-productive cough, No coughing up of blood.No change in color of mucus.No wheezing.No chest wall deformity  Skin:  no rash or lesions.  GU:  no dysuria, change in color of urine, no urgency or frequency. No flank pain.   Psych:  No change in mood or affect. No depression or anxiety. No memory loss.   Past Medical History  Diagnosis Date  . COPD (chronic obstructive pulmonary disease)   . Arthritis   . Hypertension   . Atrial fibrillation   . Sinoatrial node dysfunction   . Hip pain, left    Past Surgical History  Procedure Laterality Date  . Back surgery    . Hip arthoplasty      total  . Knee arthroplasty    . Uterine polyp removal    . Eye surgery Bilateral     cataract removal   Social History:  reports that she has never smoked. She has never used smokeless tobacco. She reports  that she does not drink alcohol or use illicit drugs.  No Known Allergies  Family History  Problem Relation Age of Onset  . Prostate cancer Father   . Heart failure Father   . Heart disease Father   . Stroke Sister   . Depression Maternal Grandmother      Prior to Admission medications   Medication Sig Start Date End Date Taking? Authorizing Provider  beta carotene w/minerals (OCUVITE) tablet Take 1 tablet by mouth daily at 12 noon.   Yes Historical Provider, MD  lisinopril (PRINIVIL,ZESTRIL) 40 MG tablet TAKE 1 TABLET EVERY DAY 09/13/13  Yes Pricilla Riffle, MD  metoprolol tartrate (LOPRESSOR) 25 MG tablet TAKE 1 TABLET (25 MG TOTAL) BY MOUTH 2 TIMES A DAY 03/11/14  Yes Antoine Poche, MD  Multiple Vitamin (MULTIVITAMIN) tablet Take 1 tablet by mouth daily.   Yes Historical Provider, MD  warfarin (COUMADIN) 5 MG tablet Take 2.5-5 mg by mouth See admin instructions. Take 2.5 mg (1/2 tablet) on Sunday and Wednesday all other days take 5 mg (1 tablet)   Yes Historical Provider, MD   Physical Exam: Filed Vitals:   05/25/14 1738  BP: 156/83  Pulse: 86  Temp:   Resp: 16    BP 156/83  Pulse 86  Temp(Src) 97.6 F (36.4 C) (Oral)  Resp 16  Ht 5\' 5"  (1.651 m)  Wt 76.658 kg (169 lb)  BMI 28.12 kg/m2  SpO2 90%  General:  Appears calm and comfortable Eyes:  PERRL, normal lids, irises & conjunctiva ENT: grossly normal hearing, lips & tongue Neck: no LAD, masses or thyromegaly Cardiovascular: Irregular rhythm. No indication of heart failure.  Respiratory: CTA bilaterally, no w/r/r. Normal respiratory effort. Abdomen: soft, ntnd Skin: no rash or induration seen on limited exam Musculoskeletal: Tender right hip. Psychiatric: grossly normal mood and affect, speech fluent and appropriate Neurologic: grossly non-focal.          Labs on Admission:  Basic Metabolic Panel:  Recent Labs Lab 05/25/14 1215  NA 139  K 4.4  CL 102  CO2 27  GLUCOSE 120*  BUN 12  CREATININE 0.58    CALCIUM 9.0   Liver Function Tests: No results found for this basename: AST, ALT, ALKPHOS, BILITOT, PROT, ALBUMIN,  in the last 168 hours No results found for this basename: LIPASE, AMYLASE,  in the last 168 hours No results found for this basename: AMMONIA,  in the last 168 hours CBC:  Recent Labs Lab 05/25/14 1215  WBC 13.5*  NEUTROABS 11.5*  HGB 12.7  HCT 38.5  MCV 100.0  PLT 212   Cardiac Enzymes: No results found for this basename: CKTOTAL, CKMB, CKMBINDEX, TROPONINI,  in the last 168 hours  BNP (last 3 results) No results found for this basename: PROBNP,  in the last 8760 hours CBG: No results found for this basename: GLUCAP,  in the last 168 hours  Radiological Exams on Admission: Dg Hip Complete Right  05/25/2014   ADDENDUM REPORT: 05/25/2014 15:11  ADDENDUM: There is a nondisplaced fracture of the proximal medial right femoral diaphysis. These findings were communicated to Dr. Estell Harpin on 05/25/2014 at 1509 hours.  There is no other fracture or dislocation.   Electronically Signed   By: Elige Ko   On: 05/25/2014 15:11   05/25/2014   CLINICAL DATA:  Right hip pain  EXAM: RIGHT HIP - COMPLETE 2+ VIEW  COMPARISON:  None.  FINDINGS: No acute fracture or dislocation. Right total hip arthroplasty. No failure or complication.  Generalized osteopenia. Posterior spinal fusion partially visualized.  There is peripheral vascular atherosclerotic disease.  IMPRESSION: Right total hip arthroplasty without failure, complication, fracture or dislocation.  Electronically Signed: By: Elige Ko On: 05/25/2014 13:00   Dg Knee Complete 4 Views Right  05/25/2014   CLINICAL DATA:  Knee pain after fall.  EXAM: RIGHT KNEE - COMPLETE 4+ VIEW  COMPARISON:  Right knee radiograph Mar 17, 2005  FINDINGS: Status post total knee arthroplasty with intact well seated femoral and tibial components. No periprosthetic lucency. Severe narrowing of the patellofemoral compartment, unchanged. No acute fracture  deformity. No dislocation. No destructive bony lesions, patient is osteopenic. Soft tissue planes are nonsuspicious, severe calcific atherosclerosis of the calf vessels.  IMPRESSION: No acute fracture deformity nor dislocation.  Stable appearance of right knee arthroplasty, no radiographic findings of hardware failure.   Electronically Signed   By: Awilda Metro   On: 05/25/2014 13:02      Assessment/Plan   1. Nondisplaced fracture of the right femoral diaphysis. 2. History of a true fibrillation on chronic anticoagulation. 3. Hypertension. 4. COPD, stable. 5. Status post pacemaker.  Plan: 1. Admit to telemetry. 2. Orthopedic consultation. 3. Analgesia as required. 4. Physical therapy evaluation.  Further recommendations will depend on patient's hospital progress.   Code Status: Full code.   Family Communication: I discussed the plan with patient at the bedside.  Disposition Plan: Depending on progress. Will probably require skilled nursing facility for rehabilitation.   Time  spent: 60 minutes.  Wilson SingerGOSRANI,Klever Twyford C [facility] Pager [phone].  **Disclaimer: This note may have been dictated with voice recognition software. Similar sounding words can inadvertently be transcribed and this note may contain transcription errors which may not have been corrected upon publication of note.**

## 2014-05-25 NOTE — Progress Notes (Signed)
ANTICOAGULATION CONSULT NOTE - Initial Consult  Pharmacy Consult for Coumadin (chronic Rx PTA) Indication: atrial fibrillation  No Known Allergies  Patient Measurements: Height: 5\' 5"  (165.1 cm) Weight: 169 lb (76.658 kg) IBW/kg (Calculated) : 57  Vital Signs: Temp: 97.6 F (36.4 C) (07/14 1107) Temp src: Oral (07/14 1107) BP: 156/83 mmHg (07/14 1738) Pulse Rate: 86 (07/14 1738)  Labs:  Recent Labs  05/25/14 1215  HGB 12.7  HCT 38.5  PLT 212  LABPROT 23.3*  INR 2.07*  CREATININE 0.58    Estimated Creatinine Clearance: 48.8 ml/min (by C-G formula based on Cr of 0.58).   Medical History: Past Medical History  Diagnosis Date  . COPD (chronic obstructive pulmonary disease)   . Arthritis   . Hypertension   . Atrial fibrillation   . Sinoatrial node dysfunction   . Hip pain, left     Medications:   (Not in a hospital admission)  Assessment: 80yo female admitted with nondisplaced fracture of right femoral diaphysis.  Pt on Coumadin PTA for afib.  INR therapeutic on admission.  Asked to resume Coumadin therapy.   Goal of Therapy:  INR 2-3 Monitor platelets by anticoagulation protocol: Yes   Plan:  Coumadin 5mg  po today (home dose) INR daily  Valrie HartHall, Monia Timmers A 05/25/2014,7:28 PM

## 2014-05-25 NOTE — ED Notes (Signed)
Pt reports to ED via EMS after falling in driveway and hurting right knee/hip. Pt reports full knee and hip replacement "around 2007." Pt reports 10/10 right knee pain with movement.  No abrasions/bruising to knee.  No LOC. Pulses strong. Pt reluctant to move right leg due to pain. Pt A&O and in NAD.

## 2014-05-25 NOTE — ED Notes (Signed)
Dr Zammit at bedside. 

## 2014-05-26 ENCOUNTER — Inpatient Hospital Stay
Admission: RE | Admit: 2014-05-26 | Discharge: 2014-07-10 | Disposition: A | Discharge status: Home / Self Care | Payer: Medicare Other | Source: Ambulatory Visit | Attending: Pulmonary Disease | Admitting: Pulmonary Disease

## 2014-05-26 DIAGNOSIS — T148XXA Other injury of unspecified body region, initial encounter: Principal | ICD-10-CM

## 2014-05-26 LAB — COMPREHENSIVE METABOLIC PANEL
ALT: 20 U/L (ref 0–35)
AST: 26 U/L (ref 0–37)
Albumin: 3 g/dL — ABNORMAL LOW (ref 3.5–5.2)
Alkaline Phosphatase: 67 U/L (ref 39–117)
Anion gap: 9 (ref 5–15)
BUN: 12 mg/dL (ref 6–23)
CALCIUM: 8.3 mg/dL — AB (ref 8.4–10.5)
CO2: 26 mEq/L (ref 19–32)
Chloride: 100 mEq/L (ref 96–112)
Creatinine, Ser: 0.56 mg/dL (ref 0.50–1.10)
GFR, EST NON AFRICAN AMERICAN: 80 mL/min — AB (ref >90)
GLUCOSE: 113 mg/dL — AB (ref 70–99)
Potassium: 4.1 mEq/L (ref 3.7–5.3)
Sodium: 135 mEq/L — ABNORMAL LOW (ref 137–147)
Total Bilirubin: 1.4 mg/dL — ABNORMAL HIGH (ref 0.3–1.2)
Total Protein: 6.2 g/dL (ref 6.0–8.3)

## 2014-05-26 LAB — CBC
HCT: 33.4 % — ABNORMAL LOW (ref 36.0–46.0)
Hemoglobin: 11.1 g/dL — ABNORMAL LOW (ref 12.0–15.0)
MCH: 32.8 pg (ref 26.0–34.0)
MCHC: 33.2 g/dL (ref 30.0–36.0)
MCV: 98.8 fL (ref 78.0–100.0)
PLATELETS: 190 10*3/uL (ref 150–400)
RBC: 3.38 MIL/uL — AB (ref 3.87–5.11)
RDW: 15.6 % — ABNORMAL HIGH (ref 11.5–15.5)
WBC: 9.9 10*3/uL (ref 4.0–10.5)

## 2014-05-26 LAB — PROTIME-INR
INR: 2.14 — ABNORMAL HIGH (ref 0.00–1.49)
PROTHROMBIN TIME: 23.9 s — AB (ref 11.6–15.2)

## 2014-05-26 MED ORDER — OXYCODONE HCL 5 MG PO TABS: 5.0000 mg | ORAL_TABLET | ORAL | Status: DC | PRN

## 2014-05-26 MED ORDER — WARFARIN SODIUM 2.5 MG PO TABS
2.5000 mg | ORAL_TABLET | Freq: Once | ORAL | Status: AC
  Administered 2014-05-26: 2.5 mg via ORAL
  Filled 2014-05-26: qty 1

## 2014-05-26 NOTE — Progress Notes (Signed)
Patient being d/c to [facility]. IV cath removed and intact. No pain/swelling at site. Report called to Nurse at facility. Will give patient Medicine prior to d/c. No c/o pain at this time. Family at bedside prior to transport.

## 2014-05-26 NOTE — Clinical Social Work Placement (Signed)
Clinical Social Work Department CLINICAL SOCIAL WORK PLACEMENT NOTE 05/26/2014  Patient:  Adrienne MochaBUSICK,[name] H  Account Number:  [id number] Admit date:  05/25/2014  Clinical Social Worker:  Derenda FennelKARA Marialuiza Car, LCSW  Date/time:  05/26/2014 10:27 AM  Clinical Social Work is seeking post-discharge placement for this patient at the following level of care:   SKILLED NURSING   (*CSW will update this form in Epic as items are completed)   05/26/2014  Patient/family provided with [facility] Department of Clinical Social Work's list of facilities offering this level of care within the geographic area requested by the patient (or if unable, by the patient's family).  05/26/2014  Patient/family informed of their freedom to choose among providers that offer the needed level of care, that participate in Medicare, Medicaid or managed care program needed by the patient, have an available bed and are willing to accept the patient.  05/26/2014  Patient/family informed of [facility]' ownership interest in [facility], as well as of the fact that they are under no obligation to receive care at this facility.  PASARR submitted to EDS on 05/26/2014 PASARR number received on 05/26/2014  FL2 transmitted to all facilities in geographic area requested by pt/family on  05/26/2014 FL2 transmitted to all facilities within larger geographic area on   Patient informed that his/her managed care company has contracts with or will negotiate with  certain facilities, including the following:     Patient/family informed of bed offers received:  05/26/2014 [facility] Physician recommends and patient chooses bed at  [facility]  Patient to be transferred [facility] on  05/26/2014 Patient to be transferred to facility by RN Patient and family notified of transfer on 05/26/2014 Name of family member notified:  [place] Sinkita- step-daughter  The following physician request  were entered in Epic:   Additional Comments:  Derenda FennelKara Ocie Tino, LCSW [phone]

## 2014-05-26 NOTE — Clinical Social Work Placement (Signed)
Clinical Social Work Department CLINICAL SOCIAL WORK PLACEMENT NOTE 05/26/2014  Patient:  Adrienne MochaBUSICK,[name] H  Account Number:  [id number] Admit date:  05/25/2014  Clinical Social Worker:  Derenda FennelKARA Betzabeth Derringer, LCSW  Date/time:  05/26/2014 10:27 AM  Clinical Social Work is seeking post-discharge placement for this patient at the following level of care:   SKILLED NURSING   (*CSW will update this form in Epic as items are completed)   05/26/2014  Patient/family provided with [facility] Department of Clinical Social Work's list of facilities offering this level of care within the geographic area requested by the patient (or if unable, by the patient's family).  05/26/2014  Patient/family informed of their freedom to choose among providers that offer the needed level of care, that participate in Medicare, Medicaid or managed care program needed by the patient, have an available bed and are willing to accept the patient.  05/26/2014  Patient/family informed of [facility]' ownership interest in [facility], as well as of the fact that they are under no obligation to receive care at this facility.  PASARR submitted to EDS on 05/26/2014 PASARR number received on 05/26/2014  FL2 transmitted to all facilities in geographic area requested by pt/family on  05/26/2014 FL2 transmitted to all facilities within larger geographic area on   Patient informed that his/her managed care company has contracts with or will negotiate with  certain facilities, including the following:     Patient/family informed of bed offers received:   Patient chooses bed at  Physician recommends and patient chooses bed at    Patient to be transferred to  on   Patient to be transferred to facility by  Patient and family notified of transfer on  Name of family member notified:    The following physician request were entered in Epic:   Additional Comments:  Derenda FennelKara Darrion Macaulay, LCSW [phone]

## 2014-05-26 NOTE — Clinical Social Work Note (Signed)
[facility] and she accepts. Pt to d/c today. Facility notified. D/C summary faxed. Pt to transfer with RN. Pt's step-daughter Fisher SinkRita also aware and agreeable.  Derenda FennelKara Reilly Blades, [place] [phone]

## 2014-05-26 NOTE — Progress Notes (Signed)
UR chart review completed.  

## 2014-05-26 NOTE — Progress Notes (Signed)
Subjective: She was admitted last night with a fracture in the femur. This is around a previous hip prosthesis. This was discussed with orthopedics on call and it is felt not to be an operative lesion. She says she's doing okay. She still has pain in her leg but right now she rates at about a 2/10. She has no other new complaints.  Objective: Vital signs in last 24 hours: Temp:  [97.6 F (36.4 C)-98.6 F (37 C)] 97.7 F (36.5 C) (07/15 0400) Pulse Rate:  [74-99] 82 (07/15 0400) Resp:  [14-16] 14 (07/15 0400) BP: (131-160)/(76-103) 131/76 mmHg (07/15 0400) SpO2:  [90 %-99 %] 92 % (07/15 0400) Weight:  [76.658 kg (169 lb)-81.058 kg (178 lb 11.2 oz)] 81.058 kg (178 lb 11.2 oz) (07/14 2054) Weight change:  Last BM Date: 05/25/14  Intake/Output from previous day: 07/14 0701 - 07/15 0700 In: 447.5 [I.V.:447.5] Out: -   PHYSICAL EXAM General appearance: alert, cooperative and mild distress Resp: clear to auscultation bilaterally Cardio: I can't tell by exam if she's in atrial fib but she has had that in the past. GI: soft, non-tender; bowel sounds normal; no masses,  no organomegaly Extremities: She has some pain and tenderness in her thigh  Lab Results:  Results for orders placed during the hospital encounter of 05/25/14 (from the past 48 hour(s))  CBC WITH DIFFERENTIAL     Status: Abnormal   Collection Time    05/25/14 12:15 PM      Result Value Ref Range   WBC 13.5 (*) 4.0 - 10.5 K/uL   RBC 3.85 (*) 3.87 - 5.11 MIL/uL   Hemoglobin 12.7  12.0 - 15.0 g/dL   HCT 38.5  36.0 - 46.0 %   MCV 100.0  78.0 - 100.0 fL   MCH 33.0  26.0 - 34.0 pg   MCHC 33.0  30.0 - 36.0 g/dL   RDW 15.5  11.5 - 15.5 %   Platelets 212  150 - 400 K/uL   Neutrophils Relative % 85 (*) 43 - 77 %   Neutro Abs 11.5 (*) 1.7 - 7.7 K/uL   Lymphocytes Relative 7 (*) 12 - 46 %   Lymphs Abs 1.0  0.7 - 4.0 K/uL   Monocytes Relative 7  3 - 12 %   Monocytes Absolute 0.9  0.1 - 1.0 K/uL   Eosinophils Relative 1  0 -  5 %   Eosinophils Absolute 0.1  0.0 - 0.7 K/uL   Basophils Relative 0  0 - 1 %   Basophils Absolute 0.0  0.0 - 0.1 K/uL  BASIC METABOLIC PANEL     Status: Abnormal   Collection Time    05/25/14 12:15 PM      Result Value Ref Range   Sodium 139  137 - 147 mEq/L   Potassium 4.4  3.7 - 5.3 mEq/L   Chloride 102  96 - 112 mEq/L   CO2 27  19 - 32 mEq/L   Glucose, Bld 120 (*) 70 - 99 mg/dL   BUN 12  6 - 23 mg/dL   Creatinine, Ser 0.58  0.50 - 1.10 mg/dL   Calcium 9.0  8.4 - 10.5 mg/dL   GFR calc non Af Amer 79 (*) >90 mL/min   GFR calc Af Amer >90  >90 mL/min   Comment: (NOTE)     The eGFR has been calculated using the CKD EPI equation.     This calculation has not been validated in all clinical situations.  eGFR's persistently <90 mL/min signify possible Chronic Kidney     Disease.   Anion gap 10  5 - 15  PROTIME-INR     Status: Abnormal   Collection Time    05/25/14 12:15 PM      Result Value Ref Range   Prothrombin Time 23.3 (*) 11.6 - 15.2 seconds   INR 2.07 (*) 0.00 - 1.49  COMPREHENSIVE METABOLIC PANEL     Status: Abnormal   Collection Time    05/26/14  5:16 AM      Result Value Ref Range   Sodium 135 (*) 137 - 147 mEq/L   Potassium 4.1  3.7 - 5.3 mEq/L   Chloride 100  96 - 112 mEq/L   CO2 26  19 - 32 mEq/L   Glucose, Bld 113 (*) 70 - 99 mg/dL   BUN 12  6 - 23 mg/dL   Creatinine, Ser 0.56  0.50 - 1.10 mg/dL   Calcium 8.3 (*) 8.4 - 10.5 mg/dL   Total Protein 6.2  6.0 - 8.3 g/dL   Albumin 3.0 (*) 3.5 - 5.2 g/dL   AST 26  0 - 37 U/L   ALT 20  0 - 35 U/L   Alkaline Phosphatase 67  39 - 117 U/L   Total Bilirubin 1.4 (*) 0.3 - 1.2 mg/dL   GFR calc non Af Amer 80 (*) >90 mL/min   GFR calc Af Amer >90  >90 mL/min   Comment: (NOTE)     The eGFR has been calculated using the CKD EPI equation.     This calculation has not been validated in all clinical situations.     eGFR's persistently <90 mL/min signify possible Chronic Kidney     Disease.   Anion gap 9  5 - 15  CBC      Status: Abnormal   Collection Time    05/26/14  5:16 AM      Result Value Ref Range   WBC 9.9  4.0 - 10.5 K/uL   RBC 3.38 (*) 3.87 - 5.11 MIL/uL   Hemoglobin 11.1 (*) 12.0 - 15.0 g/dL   HCT 33.4 (*) 36.0 - 46.0 %   MCV 98.8  78.0 - 100.0 fL   MCH 32.8  26.0 - 34.0 pg   MCHC 33.2  30.0 - 36.0 g/dL   RDW 15.6 (*) 11.5 - 15.5 %   Platelets 190  150 - 400 K/uL  PROTIME-INR     Status: Abnormal   Collection Time    05/26/14  5:16 AM      Result Value Ref Range   Prothrombin Time 23.9 (*) 11.6 - 15.2 seconds   INR 2.14 (*) 0.00 - 1.49    ABGS No results found for this basename: PHART, PCO2, PO2ART, TCO2, HCO3,  in the last 72 hours CULTURES No results found for this or any previous visit (from the past 240 hour(s)). Studies/Results: Dg Hip Complete Right  05/25/2014   ADDENDUM REPORT: 05/25/2014 15:11  ADDENDUM: There is a nondisplaced fracture of the proximal medial right femoral diaphysis. These findings were communicated to Dr. Roderic Palau on 05/25/2014 at 1509 hours.  There is no other fracture or dislocation.   Electronically Signed   By: Kathreen Devoid   On: 05/25/2014 15:11   05/25/2014   CLINICAL DATA:  Right hip pain  EXAM: RIGHT HIP - COMPLETE 2+ VIEW  COMPARISON:  None.  FINDINGS: No acute fracture or dislocation. Right total hip arthroplasty. No failure or complication.  Generalized osteopenia. Posterior spinal fusion partially visualized.  There is peripheral vascular atherosclerotic disease.  IMPRESSION: Right total hip arthroplasty without failure, complication, fracture or dislocation.  Electronically Signed: By: Kathreen Devoid On: 05/25/2014 13:00   Dg Knee Complete 4 Views Right  05/25/2014   CLINICAL DATA:  Knee pain after fall.  EXAM: RIGHT KNEE - COMPLETE 4+ VIEW  COMPARISON:  Right knee radiograph Mar 17, 2005  FINDINGS: Status post total knee arthroplasty with intact well seated femoral and tibial components. No periprosthetic lucency. Severe narrowing of the patellofemoral  compartment, unchanged. No acute fracture deformity. No dislocation. No destructive bony lesions, patient is osteopenic. Soft tissue planes are nonsuspicious, severe calcific atherosclerosis of the calf vessels.  IMPRESSION: No acute fracture deformity nor dislocation.  Stable appearance of right knee arthroplasty, no radiographic findings of hardware failure.   Electronically Signed   By: Elon Alas   On: 05/25/2014 13:02    Medications:  Prior to Admission:  Prescriptions prior to admission  Medication Sig Dispense Refill  . beta carotene w/minerals (OCUVITE) tablet Take 1 tablet by mouth daily at 12 noon.      Marland Kitchen lisinopril (PRINIVIL,ZESTRIL) 40 MG tablet TAKE 1 TABLET EVERY DAY  90 tablet  2  . metoprolol tartrate (LOPRESSOR) 25 MG tablet TAKE 1 TABLET (25 MG TOTAL) BY MOUTH 2 TIMES A DAY  180 tablet  1  . Multiple Vitamin (MULTIVITAMIN) tablet Take 1 tablet by mouth daily.      Marland Kitchen warfarin (COUMADIN) 5 MG tablet Take 2.5-5 mg by mouth See admin instructions. Take 2.5 mg (1/2 tablet) on Sunday and Wednesday all other days take 5 mg (1 tablet)       Scheduled: . beta carotene w/minerals  1 tablet Oral Q1200  . lisinopril  40 mg Oral Daily  . metoprolol tartrate  25 mg Oral BID  . multivitamin with minerals  1 tablet Oral Daily  . Warfarin - Pharmacist Dosing Inpatient   Does not apply q1800   Continuous: . sodium chloride 50 mL/hr at 05/25/14 2057   LTJ:[id number] (ZOFRAN) IV, ondansetron, oxyCODONE  Assesment: She had a previous hip replacement. She has a fracture in her femur. She is chronically anticoagulated. She has chronic atrial fibrillation which is stable. She has COPD which is stable. She has had a pacemaker. She's had trouble with her back and has some difficulty with ambulation even at baseline Active Problems:   HYPERTENSION   Atrial fibrillation   COPD   PACEMAKER, PERMANENT   Encounter for long-term (current) use of anticoagulants   Hip fracture    Plan:  She will have PT consultation. She may need skilled care facility placement before she goes home.    LOS: 1 day   Jalee Saine L 05/26/2014, 8:51 AM

## 2014-05-26 NOTE — Care Management Note (Signed)
    Page 1 of 1   05/26/2014     12:05:20 PM CARE MANAGEMENT NOTE 05/26/2014  Patient:  Adrienne MochaBUSICK,Arlenne H   Account Number:  [id number]  Date Initiated:  05/26/2014  Documentation initiated by:  Sharrie RothmanBLACKWELL,Cyanne Delmar C  Subjective/Objective Assessment:   Pt admitted from home with hip fracture. Pt lives alone and has family that is very active in the care of the pt. Pt has a cane and walker for home use, but stated that she is fairly independent with ADL's.     Action/Plan:   PT is recommending SNF. CSW is aware and will start bed search.   Anticipated DC Date:  05/26/2014   Anticipated DC Plan:  SKILLED NURSING FACILITY  In-house referral  Clinical Social Worker      DC Planning Services  CM consult      Choice offered to / List presented to:             Status of service:  Completed, signed off Medicare Important Message given?   (If response is "NO", the following Medicare IM given date fields will be blank) Date Medicare IM given:   Medicare IM given by:   Date Additional Medicare IM given:   Additional Medicare IM given by:    Discharge Disposition:  SKILLED NURSING FACILITY  Per UR Regulation:    If discussed at Long Length of Stay Meetings, dates discussed:    Comments:  05/26/14 1205 Arlyss Queenammy Zahir Eisenhour, RN BSN CM

## 2014-05-26 NOTE — Progress Notes (Signed)
ANTICOAGULATION CONSULT NOTE - follow up  Pharmacy Consult for Coumadin (chronic Rx PTA) Indication: atrial fibrillation  No Known Allergies  Patient Measurements: Height: 5\' 5"  (165.1 cm) Weight: 178 lb 11.2 oz (81.058 kg) IBW/kg (Calculated) : 57  Vital Signs: Temp: 97.7 F (36.5 C) (07/15 0400) Temp src: Oral (07/15 0400) BP: 131/76 mmHg (07/15 0400) Pulse Rate: 82 (07/15 0400)  Labs:  Recent Labs  05/25/14 1215 05/26/14 0516  HGB 12.7 11.1*  HCT 38.5 33.4*  PLT 212 190  LABPROT 23.3* 23.9*  INR 2.07* 2.14*  CREATININE 0.58 0.56   Estimated Creatinine Clearance: 50.1 ml/min (by C-G formula based on Cr of 0.56).  Medical History: Past Medical History  Diagnosis Date  . COPD (chronic obstructive pulmonary disease)   . Arthritis   . Hypertension   . Atrial fibrillation   . Sinoatrial node dysfunction   . Hip pain, left    Medications:  Prescriptions prior to admission  Medication Sig Dispense Refill  . beta carotene w/minerals (OCUVITE) tablet Take 1 tablet by mouth daily at 12 noon.      Marland Kitchen. lisinopril (PRINIVIL,ZESTRIL) 40 MG tablet TAKE 1 TABLET EVERY DAY  90 tablet  2  . metoprolol tartrate (LOPRESSOR) 25 MG tablet TAKE 1 TABLET (25 MG TOTAL) BY MOUTH 2 TIMES A DAY  180 tablet  1  . Multiple Vitamin (MULTIVITAMIN) tablet Take 1 tablet by mouth daily.      Marland Kitchen. warfarin (COUMADIN) 5 MG tablet Take 2.5-5 mg by mouth See admin instructions. Take 2.5 mg (1/2 tablet) on Sunday and Wednesday all other days take 5 mg (1 tablet)       Assessment: 80yo female admitted with nondisplaced fracture of right femoral diaphysis.  Pt on Coumadin PTA for afib.  INR therapeutic on admission.  Asked to resume Coumadin therapy.   Goal of Therapy:  INR 2-3 Monitor platelets by anticoagulation protocol: Yes   Plan:  Coumadin 2.5mg  po today (home dose) INR daily  Valrie HartHall, Kaydon Husby A 05/26/2014,8:52 AM

## 2014-05-26 NOTE — Discharge Summary (Signed)
Physician Discharge Summary  Patient ID: [name] MRN: [id number] DOB/AGE: [birth year] 80 y.o. Primary Care Physician:Crew Goren L, MD Admit date: 05/25/2014 Discharge date: 05/26/2014    Discharge Diagnoses:   Active Problems:   HYPERTENSION   Atrial fibrillation   COPD   PACEMAKER, PERMANENT   Encounter for long-term (current) use of anticoagulants   Hip fracture femoral shaft fracture    Medication List         beta carotene w/minerals tablet  Take 1 tablet by mouth daily at 12 noon.     lisinopril 40 MG tablet  Commonly known as:  PRINIVIL,ZESTRIL  TAKE 1 TABLET EVERY DAY     metoprolol tartrate 25 MG tablet  Commonly known as:  LOPRESSOR  TAKE 1 TABLET (25 MG TOTAL) BY MOUTH 2 TIMES A DAY     multivitamin tablet  Take 1 tablet by mouth daily.     oxyCODONE 5 MG immediate release tablet  Commonly known as:  Oxy IR/ROXICODONE  Take 1 tablet (5 mg total) by mouth every 4 (four) hours as needed for moderate pain.     warfarin 5 MG tablet  Commonly known as:  COUMADIN  Take 2.5-5 mg by mouth See admin instructions. Take 2.5 mg (1/2 tablet) on Sunday and Wednesday all other days take 5 mg (1 tablet)        Discharged Condition: Unchanged    Consults: None  Significant Diagnostic Studies: Dg Hip Complete Right  05/25/2014   ADDENDUM REPORT: 05/25/2014 15:11  ADDENDUM: There is a nondisplaced fracture of the proximal medial right femoral diaphysis. These findings were communicated to Dr. Estell Harpin on 05/25/2014 at 1509 hours.  There is no other fracture or dislocation.   Electronically Signed   By: Elige Ko   On: 05/25/2014 15:11   05/25/2014   CLINICAL DATA:  Right hip pain  EXAM: RIGHT HIP - COMPLETE 2+ VIEW  COMPARISON:  None.  FINDINGS: No acute fracture or dislocation. Right total hip arthroplasty. No failure or complication.  Generalized osteopenia. Posterior spinal fusion partially visualized.  There is peripheral vascular atherosclerotic  disease.  IMPRESSION: Right total hip arthroplasty without failure, complication, fracture or dislocation.  Electronically Signed: By: Elige Ko On: 05/25/2014 13:00   Dg Knee Complete 4 Views Right  05/25/2014   CLINICAL DATA:  Knee pain after fall.  EXAM: RIGHT KNEE - COMPLETE 4+ VIEW  COMPARISON:  Right knee radiograph Mar 17, 2005  FINDINGS: Status post total knee arthroplasty with intact well seated femoral and tibial components. No periprosthetic lucency. Severe narrowing of the patellofemoral compartment, unchanged. No acute fracture deformity. No dislocation. No destructive bony lesions, patient is osteopenic. Soft tissue planes are nonsuspicious, severe calcific atherosclerosis of the calf vessels.  IMPRESSION: No acute fracture deformity nor dislocation.  Stable appearance of right knee arthroplasty, no radiographic findings of hardware failure.   Electronically Signed   By: Awilda Metro   On: 05/25/2014 13:02    Lab Results: Basic Metabolic Panel:  Recent Labs  09/07/24 1215 05/26/14 0516  NA 139 135*  K 4.4 4.1  CL 102 100  CO2 27 26  GLUCOSE 120* 113*  BUN 12 12  CREATININE 0.58 0.56  CALCIUM 9.0 8.3*   Liver Function Tests:  Recent Labs  05/26/14 0516  AST 26  ALT 20  ALKPHOS 67  BILITOT 1.4*  PROT 6.2  ALBUMIN 3.0*     CBC:  Recent Labs  05/25/14 1215 05/26/14 0516  WBC 13.5* 9.9  NEUTROABS 11.5*  --   HGB 12.7 11.1*  HCT 38.5 33.4*  MCV 100.0 98.8  PLT 212 190    No results found for this or any previous visit (from the past 240 hour(s)).   Hospital Course: This is an 80 year old who was in her usual state of fair health at home when she fell in her driveway. She suffered pain in her hip on the right. She had a previous hip replacement on the right. When she came to the emergency department she was noted to have a fracture in the femoral shaft associated with her hip replacement. This was discussed with orthopedics on-call in appears that it  was not an operative injury. She was brought into the hospital for pain control and had PT evaluation and it was felt that she would need rehabilitation before she return directly home. She does live at home alone.  Discharge Exam: Blood pressure 131/76, pulse 82, temperature 97.7 F (36.5 C), temperature source Oral, resp. rate 14, height 5\' 5"  (1.651 m), weight 81.058 kg (178 lb 11.2 oz), SpO2 92.00%. She is awake and alert. She is in atrial fibrillation which is chronic. Her chest is clear. She has some pain in her thigh  Disposition: To skilled care facility for rehabilitation      Signed: Brian Kocourek L   05/26/2014, 9:09 AM

## 2014-05-26 NOTE — Clinical Social Work Psychosocial (Signed)
Clinical Social Work Department BRIEF PSYCHOSOCIAL ASSESSMENT 05/26/2014  Patient:  [name], [name]     Account Number:  [id number]     Admit date:  05/25/2014  Clinical Social Worker:  Wyatt Haste  Date/Time:  05/26/2014 10:38 AM  Referred by:  Physician  Date Referred:  05/26/2014 Referred for  SNF Placement   Other Referral:   Interview type:  Patient Other interview type:    PSYCHOSOCIAL DATA Living Status:  ALONE Admitted from facility:   Level of care:   Primary support name:  Velva Harman Primary support relationship to patient:  CHILD, ADULT Degree of support available:   supportive per pt    CURRENT CONCERNS Current Concerns  Post-Acute Placement   Other Concerns:    SOCIAL WORK ASSESSMENT / PLAN CSW met with pt at bedside. Pt alert and oriented and reports she lives alone. She was outside yesterday and fell, fracturing her femur. She states she pushed her [employer]. Pt has been a widow for 8 years and has lived alone since then. She has 4 very involved step-children and pt states she never wants for anything. Pt has managed fine at home independently with assist from her step-children for driving and other tasks. She primarily fixes sandwiches and frozen meals at home in addition to food family brings in. Pt indicates she has had balance issues for several years, and ambulates primarily with a walker. Yesterday when she fell she had walked outside with her cane. PT evaluated pt and recommendation is for SNF. CSW discussed placement process, including possible copays. She states she has been to [facility] in the past after surgery. Requests [facility] if bed available. Pt is ready for d/c today. Pt reports it is going to be hard for her to not be able to get up and do things as she had at home, but recognizes benefit of SNF.   Assessment/plan status:  Psychosocial Support/Ongoing Assessment of Needs Other assessment/ plan:   Information/referral to community resources:   SNF list     PATIENT'S/FAMILY'S RESPONSE TO PLAN OF CARE: Pt agreeable to [facility] for rehab. CSW will initiate bed search and follow up with bed offers.       Benay Pike, [place]

## 2014-05-26 NOTE — Evaluation (Signed)
Physical Therapy Evaluation Patient Details Name: [name] MRN: [id number] DOB: 04-Sep-1923 Today's Date: 05/26/2014   History of Present Illness  This is a delightful [age] year old lady who had an accidental fall today. She is injured her right hip, at the site of the right hip prosthesis previously. She has sustained a nondisplaced fracture of the proximal medial right femoral diaphysis. She is now being admitted for further management.  Per orthopedics, pt to be treated non-operatively.    Clinical Impression  Pt is an 80 year old female who presents to physical therapy after a fall resulting in a nondisplace fx of proximal medial Rt femoral diaphysis.  Pt to be treated non-operatively by orthopedics; no WB restrictions noted per chart.  Pt reports a hx of Rt hip replacement, Rt knee replacement, and Rt back surgery.  Pt lives alone in a multilevel home with 3 steps to enter.  Pt reports she was mod (I) with functional mobility skills in the home with RW and outside the home to get the newspaper with quad cane.  Pt does have step kids nearby to assist with cooking, driving, shopping, and heavy household duties as needed.  During evaluation, pt reported no pain when in bed, though pain increased if she tried to move her Rt leg.  Pt able to move her ankle on the Rt, though unable to move her Rt knee or hip actively secondary to pain. Pt required max assist to transfer supine <-> sit with multiple rest breaks secondary to pain.  Attempted sit <-> stand transfer with max assist and bed elevated, though pt self limited all attempts secondary to pain.  Pt did report she had not had a pain medication this morning prior to PT evaluation, as she did not want it when she just staying still and pain was not that bad.  RN made aware of 10/10 pain, and medication brought at end of PT evaluation.  Recommend continued PT while in the hospital to address ROM, strengthening, pain management, balance, and activity  tolerance for improvement of functional mobility skills with transition to SNF for continued rehab.  Will defer to SNF for DME recommendations.      Follow Up Recommendations SNF    Equipment Recommendations   (Will defer to SNF. )       Precautions / Restrictions Precautions Precautions: Fall Restrictions Other Position/Activity Restrictions: Recommend WBAT to Rt hip, no specific orders stated from orthopedics.        Mobility  Bed Mobility Overal bed mobility: Needs Assistance Bed Mobility: Supine to Sit;Sit to Supine     Supine to sit: Max assist Sit to supine: Max assist   General bed mobility comments: Pt required max assist to complete bed mobility secondary to pain.  Max VC for movement of Lt LE and UE to attempt to assist with transfer, though pt unable to comply secondary to pain in Rt hip.    Transfers Overall transfer level: Needs assistance Equipment used: Rolling walker (2 wheeled) Transfers: Sit to/from Stand Sit to Stand: Total assist;From elevated surface         General transfer comment: Attempted sit <-> stand transfer x3 with bed elevated to increase ease with transfer.  Pt unable to complete transfer, despite max assist and elevated surface, secondary to pain "cramping" into Rt hip.    Ambulation/Gait             General Gait Details: Unable secondary to pain.        Balance  Overall balance assessment: History of Falls;Needs assistance Sitting-balance support: Feet supported;Single extremity supported Sitting balance-Leahy Scale: Fair Sitting balance - Comments: Pt leaning to Lt side to unweight Rt hip secondry to pain.       Standing balance-Leahy Scale: Zero                               Pertinent Vitals/Pain 10/10 pain during transfer to EOB. RN made aware, pt repositioned back in bed after PT evaluation completed.      Home Living Family/patient expects to be discharged to:: Skilled nursing facility                  Additional Comments: Pt lives alone in a multilevel home with 3 steps and Rt handrail to enter.  Pt reports she has a basement in the home though she has not been down to basement in 4 years. She reports step kids live nearby and are able to help only PRN.  DME: RW, quad cane, electric W/c, BSC, tub shower, shower seat    Prior Function Level of Independence: Independent with assistive device(s)   Gait / Transfers Assistance Needed: Pt was mod (I) with bed mobility skills, transfers, and household amb with RW and amb outside the home (to get the newspaper) with quad cane.    ADL's / Homemaking Assistance Needed: Pt is mod (I) with feeding, dressing, bathing.  She does not drive, shop, or do heavy household duties.  She eats mostly sandwiches or microwavable meals.             Extremity/Trunk Assessment               Lower Extremity Assessment: RLE deficits/detail;LLE deficits/detail RLE Deficits / Details: MMT hip/knee deferred secondary to pain, ankle 4/5 LLE Deficits / Details: MMT hip 3/5, knee 4-/5, ankle 3+/5     Communication   Communication: No difficulties  Cognition Arousal/Alertness: Awake/alert Behavior During Therapy: WFL for tasks assessed/performed Overall Cognitive Status: Within Functional Limits for tasks assessed                        Assessment/Plan    PT Assessment Patient needs continued PT services  PT Diagnosis Difficulty walking;Generalized weakness;Acute pain   PT Problem List Decreased strength;Decreased range of motion;Decreased activity tolerance;Decreased safety awareness;Pain;Decreased balance;Decreased mobility  PT Treatment Interventions Gait training;Neuromuscular re-education;Balance training;Functional mobility training;Therapeutic activities;Therapeutic exercise;Patient/family education;Manual techniques   PT Goals (Current goals can be found in the Care Plan section) Acute Rehab PT Goals Patient Stated Goal: decrease  pain, walk PT Goal Formulation: With patient Time For Goal Achievement: 06/09/14 Potential to Achieve Goals: Good    Frequency Min 3X/week   Barriers to discharge Inaccessible home environment;Decreased caregiver support         End of Session Equipment Utilized During Treatment: Gait belt Activity Tolerance: Patient limited by pain Patient left: in bed;with call bell/phone within reach;with bed alarm set Nurse Communication: Patient requests pain meds         Time: 1610-9604 PT Time Calculation (min): 28 min   Charges:   PT Evaluation $Initial PT Evaluation Tier I: 1 Procedure          [name] 05/26/2014, 10:11 AM

## 2014-05-31 LAB — PROTIME-INR: INR: 2.4 — AB (ref 0.9–1.1)

## 2014-06-04 ENCOUNTER — Other Ambulatory Visit: Payer: Self-pay | Admitting: Internal Medicine

## 2014-06-07 ENCOUNTER — Other Ambulatory Visit: Payer: Self-pay | Admitting: *Deleted

## 2014-06-09 ENCOUNTER — Ambulatory Visit (HOSPITAL_COMMUNITY): Payer: No Typology Code available for payment source | Attending: Pulmonary Disease

## 2014-06-09 DIAGNOSIS — M25569 Pain in unspecified knee: Secondary | ICD-10-CM | POA: Insufficient documentation

## 2014-06-14 NOTE — H&P (Signed)
[name] MRN: [id number] DOB/AGE: 78-10-1923 78 y.o. Primary Care Physician:Morris Markham L, MD Admit date: 05/26/2014 Chief Complaint: This is documentation of the history and physical done on 06/13/2014 HPI: This is an 80 year old who had a fall at home and had a hip fracture at the area of a previous right hip prosthesis. This is nondisplaced and is felt not to be a surgical lesion so she is in a skilled care facility for rehabilitation. She has multiple other medical problems including COPD chronic arthritis hypertension atrial fibrillation and previous trouble with her left leg following back surgery  Past Medical History  Diagnosis Date  . COPD (chronic obstructive pulmonary disease)   . Arthritis   . Hypertension   . Atrial fibrillation   . Sinoatrial node dysfunction   . Hip pain, left    Past Surgical History  Procedure Laterality Date  . Back surgery    . Hip arthoplasty      total  . Knee arthroplasty    . Uterine polyp removal    . Eye surgery Bilateral     cataract removal        Family History  Problem Relation Age of Onset  . Prostate cancer Father   . Heart failure Father   . Heart disease Father   . Stroke Sister   . Depression Maternal Grandmother     Social History:  reports that she has never smoked. She has never used smokeless tobacco. She reports that she does not drink alcohol or use illicit drugs.   Allergies: No Known Allergies  Medications Prior to Admission  Medication Sig Dispense Refill  . beta carotene w/minerals (OCUVITE) tablet Take 1 tablet by mouth daily at 12 noon.      . metoprolol tartrate (LOPRESSOR) 25 MG tablet TAKE 1 TABLET (25 MG TOTAL) BY MOUTH 2 TIMES A DAY  180 tablet  1  . Multiple Vitamin (MULTIVITAMIN) tablet Take 1 tablet by mouth daily.      Marland Kitchen oxyCODONE (OXY IR/ROXICODONE) 5 MG immediate release tablet Take 1 tablet (5 mg total) by mouth every 4 (four) hours as needed for moderate pain.  30 tablet  0  .  warfarin (COUMADIN) 5 MG tablet Take 2.5-5 mg by mouth See admin instructions. Take 2.5 mg (1/2 tablet) on Sunday and Wednesday all other days take 5 mg (1 tablet)           WUJ:WJXBJ from the symptoms mentioned above,there are no other symptoms referable to all systems reviewed.  Physical Exam: There were no vitals taken for this visit. She is awake and alert. She sitting in a wheelchair. She has bruising of her right leg that I think is actually referred from her hip injury. Her HEENT exam shows he is wearing glasses. Nose and throat are clear. Her neck is supple. Her chest is clear. Her heart is irregularly irregular without gallop. Her abdomen is soft without masses. Extremities showed bruising on her right knee and she's had a knee replacement. She has a surgical scar on her right hip. She has no edema. Central nervous system exam is grossly intact   No results found for this basename: WBC, NEUTROABS, HGB, HCT, MCV, PLT,  in the last 72 hours No results found for this basename: NA, K, CL, CO2, GLUCOSE, BUN, CREATININE, CALCIUM, MG, PHO,  in the last 72 hourslablast2(ast:2,ALT:2,alkphos:2,bilitot:2,prot:2,albumin:2)@    No results found for this or any previous visit (from the past 240 hour(s)).   Dg Hip Complete Right  05/25/2014   ADDENDUM REPORT: 05/25/2014 15:11  ADDENDUM: There is a nondisplaced fracture of the proximal medial right femoral diaphysis. These findings were communicated to Dr. Estell HarpinZammit on 05/25/2014 at 1509 hours.  There is no other fracture or dislocation.   Electronically Signed   By: Elige KoHetal  Patel   On: 05/25/2014 15:11   05/25/2014   CLINICAL DATA:  Right hip pain  EXAM: RIGHT HIP - COMPLETE 2+ VIEW  COMPARISON:  None.  FINDINGS: No acute fracture or dislocation. Right total hip arthroplasty. No failure or complication.  Generalized osteopenia. Posterior spinal fusion partially visualized.  There is peripheral vascular atherosclerotic disease.  IMPRESSION: Right total  hip arthroplasty without failure, complication, fracture or dislocation.  Electronically Signed: By: Elige KoHetal  Patel On: 05/25/2014 13:00   Dg Knee Complete 4 Views Right  06/09/2014   CLINICAL DATA:  Fall.  Pain and bruising.  EXAM: RIGHT KNEE - COMPLETE 4+ VIEW  COMPARISON:  05/25/2014  FINDINGS: Previous total knee arthroplasty is unchanged. No effusion. No fracture. No complicating feature.  IMPRESSION: No complications seen relative to the total knee arthroplasty.   Electronically Signed   By: Paulina FusiMark  Shogry M.D.   On: 06/09/2014 13:51   Dg Knee Complete 4 Views Right  05/25/2014   CLINICAL DATA:  Knee pain after fall.  EXAM: RIGHT KNEE - COMPLETE 4+ VIEW  COMPARISON:  Right knee radiograph Mar 17, 2005  FINDINGS: Status post total knee arthroplasty with intact well seated femoral and tibial components. No periprosthetic lucency. Severe narrowing of the patellofemoral compartment, unchanged. No acute fracture deformity. No dislocation. No destructive bony lesions, patient is osteopenic. Soft tissue planes are nonsuspicious, severe calcific atherosclerosis of the calf vessels.  IMPRESSION: No acute fracture deformity nor dislocation.  Stable appearance of right knee arthroplasty, no radiographic findings of hardware failure.   Electronically Signed   By: Awilda Metroourtnay  Bloomer   On: 05/25/2014 13:02   Impression: She has a nonsurgical hip fracture. She has other medical problems as documented. Her blood pressure is well-controlled. Her heart rate is well controlled. Active Problems:   * No active hospital problems. *     Plan: Continue with rehabilitation      Missael Ferrari L   06/14/2014, 8:54 AM

## 2014-06-21 LAB — PROTIME-INR: INR: 2.2 — AB (ref 0.9–1.1)

## 2014-06-28 ENCOUNTER — Ambulatory Visit: Payer: Medicare Other | Admitting: Cardiology

## 2014-07-06 LAB — PROTIME-INR: INR: 2.8 — AB (ref 0.9–1.1)

## 2014-07-12 ENCOUNTER — Ambulatory Visit (INDEPENDENT_AMBULATORY_CARE_PROVIDER_SITE_OTHER): Payer: Medicare Other | Admitting: *Deleted

## 2014-07-12 DIAGNOSIS — I4891 Unspecified atrial fibrillation: Secondary | ICD-10-CM

## 2014-07-12 DIAGNOSIS — Z7901 Long term (current) use of anticoagulants: Secondary | ICD-10-CM

## 2014-07-12 DIAGNOSIS — Z5181 Encounter for therapeutic drug level monitoring: Secondary | ICD-10-CM

## 2014-07-12 LAB — POCT INR: INR: 2.3

## 2014-07-22 ENCOUNTER — Encounter: Payer: Self-pay | Admitting: Cardiology

## 2014-07-22 ENCOUNTER — Ambulatory Visit (INDEPENDENT_AMBULATORY_CARE_PROVIDER_SITE_OTHER): Payer: Medicare Other | Admitting: *Deleted

## 2014-07-22 ENCOUNTER — Ambulatory Visit (INDEPENDENT_AMBULATORY_CARE_PROVIDER_SITE_OTHER): Payer: Medicare Other | Admitting: Cardiology

## 2014-07-22 VITALS — BP 130/62 | HR 75 | Ht 65.0 in | Wt 172.0 lb

## 2014-07-22 DIAGNOSIS — I4891 Unspecified atrial fibrillation: Secondary | ICD-10-CM

## 2014-07-22 DIAGNOSIS — I495 Sick sinus syndrome: Secondary | ICD-10-CM

## 2014-07-22 DIAGNOSIS — I10 Essential (primary) hypertension: Secondary | ICD-10-CM

## 2014-07-22 DIAGNOSIS — Z5181 Encounter for therapeutic drug level monitoring: Secondary | ICD-10-CM

## 2014-07-22 DIAGNOSIS — Z7901 Long term (current) use of anticoagulants: Secondary | ICD-10-CM

## 2014-07-22 LAB — POCT INR: INR: 1.7

## 2014-07-22 NOTE — Patient Instructions (Signed)
Your physician wants you to follow-up in: 6 months You will receive a reminder letter in the mail two months in advance. If you don't receive a letter, please call our office to schedule the follow-up appointment.     Your physician recommends that you continue on your current medications as directed. Please refer to the Current Medication list given to you today.      Thank you for choosing [facility] !        

## 2014-07-22 NOTE — Progress Notes (Signed)
Clinical Summary Ms. Otter is a 80 y.o.female seen today for follow up of the following medical problems.   1. Afib  - compliant with coumadin, denies any bleeding problems  - denies any significant palpitations   2. Tachy-Brady Syndrome  - has permanent pacemaker followed by EP Dr Ladona Ridgel, last check 02/08/14 with normal function and no high ventricular rates.  - denies any lightheadedness or dizziness   3. HTN  - compliant with meds  - does not check at home regularly.   4. Fall - [place], fractured right hip.  - hip fracture thought not to be surgical candidate  Past Medical History  Diagnosis Date  . COPD (chronic obstructive pulmonary disease)   . Arthritis   . Hypertension   . Atrial fibrillation   . Sinoatrial node dysfunction   . Hip pain, left      No Known Allergies   Current Outpatient Prescriptions  Medication Sig Dispense Refill  . beta carotene w/minerals (OCUVITE) tablet Take 1 tablet by mouth daily at 12 noon.      Marland Kitchen lisinopril (PRINIVIL,ZESTRIL) 40 MG tablet TAKE 1 TABLET BY MOUTH EVERY DAY  90 tablet  2  . metoprolol tartrate (LOPRESSOR) 25 MG tablet TAKE 1 TABLET (25 MG TOTAL) BY MOUTH 2 TIMES A DAY  180 tablet  1  . Multiple Vitamin (MULTIVITAMIN) tablet Take 1 tablet by mouth daily.      Marland Kitchen oxyCODONE (OXY IR/ROXICODONE) 5 MG immediate release tablet Take 1 tablet (5 mg total) by mouth every 4 (four) hours as needed for moderate pain.  30 tablet  0  . warfarin (COUMADIN) 4 MG tablet Take 4 mg by mouth daily.       No current facility-administered medications for this visit.     Past Surgical History  Procedure Laterality Date  . Back surgery    . Hip arthoplasty      total  . Knee arthroplasty    . Uterine polyp removal    . Eye surgery Bilateral     cataract removal     No Known Allergies    Family History  Problem Relation Age of Onset  . Prostate cancer Father   . Heart failure Father   . Heart  disease Father   . Stroke Sister   . Depression Maternal Grandmother      Social History Ms. Faires reports that she has never smoked. She has never used smokeless tobacco. Ms. [place] reports that she does not drink alcohol.   Review of Systems CONSTITUTIONAL: No weight loss, fever, chills, weakness or fatigue.  HEENT: Eyes: No visual loss, blurred vision, double vision or yellow sclerae.No hearing loss, sneezing, congestion, runny nose or sore throat.  SKIN: No rash or itching.  CARDIOVASCULAR: per HPI RESPIRATORY: No shortness of breath, cough or sputum.  GASTROINTESTINAL: No anorexia, nausea, vomiting or diarrhea. No abdominal pain or blood.  GENITOURINARY: No burning on urination, no polyuria NEUROLOGICAL: No headache, dizziness, syncope, paralysis, ataxia, numbness or tingling in the extremities. No change in bowel or bladder control.  MUSCULOSKELETAL: No muscle, back pain, joint pain or stiffness.  LYMPHATICS: No enlarged nodes. No history of splenectomy.  PSYCHIATRIC: No history of depression or anxiety.  ENDOCRINOLOGIC: No reports of sweating, cold or heat intolerance. No polyuria or polydipsia.  Marland Kitchen   Physical Examination p 75 bp 130/62 Wt 172 lbs BMI 29 Gen: resting comfortably, no acute distress HEENT: no scleral icterus, pupils equal  round and reactive, no palptable cervical adenopathy,  CV: irreg, no m/r/g, no JVD, no carotid bruits Resp: Clear to auscultation bilaterally GI: abdomen is soft, non-tender, non-distended, normal bowel sounds, no hepatosplenomegaly MSK: extremities are warm, no edema.  Skin: warm, no rash Neuro:  no focal deficits Psych: appropriate affect      Assessment and Plan  1. Afib  - no current symptoms  - continue current medical therapy and anticoagulation   2. Tachy-brady syndrome  - no current symptoms, continue regular follow up with EP clinic   3. HTN  - at goal, continue current meds       Antoine Poche, M.D.,  F.A.C.C.

## 2014-08-05 ENCOUNTER — Ambulatory Visit (INDEPENDENT_AMBULATORY_CARE_PROVIDER_SITE_OTHER): Payer: Medicare Other | Admitting: *Deleted

## 2014-08-05 DIAGNOSIS — Z7901 Long term (current) use of anticoagulants: Secondary | ICD-10-CM

## 2014-08-05 DIAGNOSIS — Z5181 Encounter for therapeutic drug level monitoring: Secondary | ICD-10-CM

## 2014-08-05 DIAGNOSIS — I4891 Unspecified atrial fibrillation: Secondary | ICD-10-CM

## 2014-08-05 LAB — POCT INR: INR: 2

## 2014-08-05 MED ORDER — WARFARIN SODIUM 4 MG PO TABS: 4.0000 mg | ORAL_TABLET | Freq: Every day | ORAL | Status: DC

## 2014-08-13 ENCOUNTER — Encounter: Payer: Self-pay | Admitting: Internal Medicine

## 2014-08-13 ENCOUNTER — Ambulatory Visit (INDEPENDENT_AMBULATORY_CARE_PROVIDER_SITE_OTHER): Payer: Medicare Other | Admitting: *Deleted

## 2014-08-13 DIAGNOSIS — I482 Chronic atrial fibrillation, unspecified: Secondary | ICD-10-CM

## 2014-08-13 LAB — MDC_IDC_ENUM_SESS_TYPE_INCLINIC
Battery Impedance: 2000 Ohm
Battery Voltage: 2.75 V
Brady Statistic RA Percent Paced: 1 %
Brady Statistic RV Percent Paced: 29 %
Date Time Interrogation Session: 20151002140402
Lead Channel Impedance Value: 489 Ohm
Lead Channel Impedance Value: 533 Ohm
Lead Channel Sensing Intrinsic Amplitude: 0.2 mV
Lead Channel Setting Pacing Pulse Width: 0.5 ms
MDC IDC MSMT LEADCHNL RV PACING THRESHOLD AMPLITUDE: 0.375 V
MDC IDC MSMT LEADCHNL RV PACING THRESHOLD PULSEWIDTH: 0.5 ms
MDC IDC MSMT LEADCHNL RV SENSING INTR AMPL: 12 mV
MDC IDC PG SERIAL: 1683162
MDC IDC SET LEADCHNL RA PACING AMPLITUDE: 2 V
MDC IDC SET LEADCHNL RV SENSING SENSITIVITY: 2 mV

## 2014-08-13 NOTE — Progress Notes (Signed)
PPM check in office. 

## 2014-08-25 ENCOUNTER — Ambulatory Visit (INDEPENDENT_AMBULATORY_CARE_PROVIDER_SITE_OTHER): Payer: Medicare Other | Admitting: *Deleted

## 2014-08-25 DIAGNOSIS — Z5181 Encounter for therapeutic drug level monitoring: Secondary | ICD-10-CM

## 2014-08-25 DIAGNOSIS — Z7901 Long term (current) use of anticoagulants: Secondary | ICD-10-CM

## 2014-08-25 DIAGNOSIS — I4891 Unspecified atrial fibrillation: Secondary | ICD-10-CM

## 2014-08-25 LAB — POCT INR: INR: 2.1

## 2014-09-22 ENCOUNTER — Ambulatory Visit (INDEPENDENT_AMBULATORY_CARE_PROVIDER_SITE_OTHER): Payer: Medicare Other | Admitting: *Deleted

## 2014-09-22 DIAGNOSIS — Z5181 Encounter for therapeutic drug level monitoring: Secondary | ICD-10-CM

## 2014-09-22 DIAGNOSIS — I4891 Unspecified atrial fibrillation: Secondary | ICD-10-CM

## 2014-09-22 DIAGNOSIS — Z7901 Long term (current) use of anticoagulants: Secondary | ICD-10-CM

## 2014-09-22 LAB — POCT INR: INR: 2.1

## 2014-11-08 ENCOUNTER — Ambulatory Visit (INDEPENDENT_AMBULATORY_CARE_PROVIDER_SITE_OTHER): Payer: Medicare Other | Admitting: *Deleted

## 2014-11-08 DIAGNOSIS — I4891 Unspecified atrial fibrillation: Secondary | ICD-10-CM

## 2014-11-08 DIAGNOSIS — Z5181 Encounter for therapeutic drug level monitoring: Secondary | ICD-10-CM

## 2014-11-08 DIAGNOSIS — Z7901 Long term (current) use of anticoagulants: Secondary | ICD-10-CM

## 2014-11-08 LAB — POCT INR: INR: 1.7

## 2014-11-26 ENCOUNTER — Other Ambulatory Visit: Payer: Self-pay | Admitting: *Deleted

## 2014-11-26 ENCOUNTER — Telehealth: Payer: Self-pay | Admitting: *Deleted

## 2014-11-26 DIAGNOSIS — I10 Essential (primary) hypertension: Secondary | ICD-10-CM

## 2014-11-26 MED ORDER — METOPROLOL TARTRATE 25 MG PO TABS: ORAL_TABLET | ORAL | Status: DC

## 2014-11-26 NOTE — Telephone Encounter (Signed)
Pt has been out of metoprolol for two days and needs it called in to [facility] today please

## 2014-11-26 NOTE — Telephone Encounter (Signed)
Refill complete 

## 2014-11-29 ENCOUNTER — Ambulatory Visit (INDEPENDENT_AMBULATORY_CARE_PROVIDER_SITE_OTHER): Payer: Medicare Other | Admitting: *Deleted

## 2014-11-29 DIAGNOSIS — Z7901 Long term (current) use of anticoagulants: Secondary | ICD-10-CM | POA: Diagnosis not present

## 2014-11-29 DIAGNOSIS — Z5181 Encounter for therapeutic drug level monitoring: Secondary | ICD-10-CM | POA: Diagnosis not present

## 2014-11-29 DIAGNOSIS — I4891 Unspecified atrial fibrillation: Secondary | ICD-10-CM | POA: Diagnosis not present

## 2014-11-29 DIAGNOSIS — I480 Paroxysmal atrial fibrillation: Secondary | ICD-10-CM

## 2014-11-29 LAB — POCT INR: INR: 1.5

## 2014-11-29 MED ORDER — WARFARIN SODIUM 4 MG PO TABS: ORAL_TABLET | ORAL | Status: DC

## 2014-12-08 ENCOUNTER — Telehealth: Payer: Self-pay | Admitting: *Deleted

## 2014-12-08 NOTE — Telephone Encounter (Signed)
Patient wants to speak with nurse regarding Coumadin instructions / tgs

## 2014-12-08 NOTE — Telephone Encounter (Signed)
Pt states stools have looked dark since Monday.  Wonders if her blood might be to thin.  Last INR was 1.5 but dose was increased.  Told pt to take 1/2 pill tonight instead of 1 1/2 then resume regular dose tomorrow.  To call back if stools don't clear up.  May need stool cards or appt with PCP.  Has INR appt on 12/13/14.

## 2014-12-09 ENCOUNTER — Telehealth: Payer: Self-pay | Admitting: *Deleted

## 2014-12-09 NOTE — Telephone Encounter (Signed)
PT IS STILL HAVING BLACK STOOLS

## 2014-12-09 NOTE — Telephone Encounter (Signed)
Pt states stools still look "chocolate".  No obvious blood, but different in color from normal.  Pt sees Dr Juanetta GoslingHawkins for PCP.  Told her to call his office to follow up on this issue and get hemoccult cards to see if stools are positive for blood so we will know what to do about coumadin.  She will call his office now.  She will stay on the same coumadin dose for now.  She did hold coumadin last night as instructed.  She has INR appt scheduled for Monday.

## 2014-12-13 ENCOUNTER — Ambulatory Visit (INDEPENDENT_AMBULATORY_CARE_PROVIDER_SITE_OTHER): Payer: Medicare Other | Admitting: *Deleted

## 2014-12-13 DIAGNOSIS — I4891 Unspecified atrial fibrillation: Secondary | ICD-10-CM

## 2014-12-13 DIAGNOSIS — Z7901 Long term (current) use of anticoagulants: Secondary | ICD-10-CM | POA: Diagnosis not present

## 2014-12-13 DIAGNOSIS — Z5181 Encounter for therapeutic drug level monitoring: Secondary | ICD-10-CM

## 2014-12-13 LAB — POCT INR: INR: 1.5

## 2014-12-20 ENCOUNTER — Other Ambulatory Visit (HOSPITAL_COMMUNITY)
Admission: RE | Admit: 2014-12-20 | Discharge: 2014-12-20 | Disposition: A | Discharge status: Home / Self Care | Payer: Medicare Other | Source: Ambulatory Visit | Attending: Obstetrics and Gynecology | Admitting: Obstetrics and Gynecology

## 2014-12-20 ENCOUNTER — Encounter: Payer: Self-pay | Admitting: Obstetrics and Gynecology

## 2014-12-20 ENCOUNTER — Ambulatory Visit (INDEPENDENT_AMBULATORY_CARE_PROVIDER_SITE_OTHER): Payer: Medicare Other | Admitting: Obstetrics and Gynecology

## 2014-12-20 VITALS — BP 118/76 | Ht 65.0 in | Wt 168.0 lb

## 2014-12-20 DIAGNOSIS — Z01419 Encounter for gynecological examination (general) (routine) without abnormal findings: Secondary | ICD-10-CM | POA: Diagnosis not present

## 2014-12-20 DIAGNOSIS — Z124 Encounter for screening for malignant neoplasm of cervix: Secondary | ICD-10-CM | POA: Diagnosis not present

## 2014-12-20 DIAGNOSIS — Z9079 Acquired absence of other genital organ(s): Secondary | ICD-10-CM

## 2014-12-20 DIAGNOSIS — Z90722 Acquired absence of ovaries, bilateral: Secondary | ICD-10-CM

## 2014-12-20 DIAGNOSIS — Z0000 Encounter for general adult medical examination without abnormal findings: Secondary | ICD-10-CM | POA: Insufficient documentation

## 2014-12-20 DIAGNOSIS — IMO0002 Reserved for concepts with insufficient information to code with codable children: Secondary | ICD-10-CM

## 2014-12-20 NOTE — Addendum Note (Signed)
Addended by: Richardson ChiquitoRAVIS, ASHLEY M on: 12/20/2014 11:49 AM   Modules accepted: Orders

## 2014-12-20 NOTE — Progress Notes (Signed)
Patient ID: [name], female   DOB: 1923/02/08, 81 y.o.   MRN: [id number]  Assessment:  Annual Gyn Exam   Plan:  1. pap smear done, next pap due in 3 years 2. return annually or prn 3    Annual mammogram advised Subjective:  [name] is a 81 y.o. female No obstetric history on file. who presents for annual exam. No LMP recorded. Patient is postmenopausal. she is s/p hyst for endometrial cancer 5 yr ago. The patient presents today for annual exam. She denies any current complaints or pain.  The following portions of the patient's history were reviewed and updated as appropriate: allergies, current medications, past family history, past medical history, past social history, past surgical history and problem list. Past Medical History  Diagnosis Date  . COPD (chronic obstructive pulmonary disease)   . Arthritis   . Hypertension   . Atrial fibrillation   . Sinoatrial node dysfunction   . Hip pain, left     Past Surgical History  Procedure Laterality Date  . Back surgery    . Hip arthoplasty      total  . Knee arthroplasty    . Uterine polyp removal    . Eye surgery Bilateral     cataract removal     Current outpatient prescriptions:  .  lisinopril (PRINIVIL,ZESTRIL) 40 MG tablet, TAKE 1 TABLET BY MOUTH EVERY DAY, Disp: 90 tablet, Rfl: 2 .  metoprolol tartrate (LOPRESSOR) 25 MG tablet, TAKE 1 TABLET (25 MG TOTAL) BY MOUTH 2 TIMES A DAY, Disp: 180 tablet, Rfl: 1 .  Multiple Vitamin (MULTIVITAMIN) tablet, Take 1 tablet by mouth daily., Disp: , Rfl:  .  warfarin (COUMADIN) 4 MG tablet, Take 1 tablet daily except 1 1/2 tablets on Mondays, Wednesdays and Fridays or as directed, Disp: 110 tablet, Rfl: 3  Review of Systems Constitutional: negative Gastrointestinal: negative Genitourinary: negative  Objective:  BP 118/76 mmHg  Ht 5\' 5"  (1.651 m)  Wt 168 lb (76.204 kg)  BMI 27.96 kg/m2   BMI: Body mass index is 27.96 kg/(m^2).  General Appearance: Alert, appropriate  appearance for age. No acute distress HEENT: Grossly normal Neck / Thyroid:  Cardiovascular: RRR; normal S1, S2, no murmur Lungs: CTA bilaterally Back: No CVAT Breast Exam: No dimpling, nipple retraction or discharge. No masses or nodes. and No masses or nodes.No dimpling, nipple retraction or discharge. Gastrointestinal: Soft, non-tender, no masses or organomegaly Pelvic Exam: normal efg, with right labia more large than left. Vagina normal atrophic, cuff smooth ,PAP done Rectovaginal: hemoccult negative Lymphatic Exam: Non-palpable nodes in neck, clavicular, axillary, or inguinal regions  Skin: no rash or abnormalities Neurologic: Normal gait and speech, no tremor  Psychiatric: Alert and oriented, appropriate affect.  Urinalysis:Not done Hemoccult: Negative  [name]. MD Pgr [phone] 10:39 AM  This chart was scribed for [name] BurrowJohn Danayah Smyre V, MD by [name], ED Scribe. This patient was seen in room 2 and the patient's care was started at 10:39 AM.   I personally performed the services described in this documentation, which was scribed in my presence. The recorded information has been reviewed and is accurate and addended as needed  [name]

## 2014-12-21 DIAGNOSIS — M545 Low back pain: Secondary | ICD-10-CM | POA: Diagnosis not present

## 2014-12-21 DIAGNOSIS — I4891 Unspecified atrial fibrillation: Secondary | ICD-10-CM | POA: Diagnosis not present

## 2014-12-21 DIAGNOSIS — I10 Essential (primary) hypertension: Secondary | ICD-10-CM | POA: Diagnosis not present

## 2014-12-21 DIAGNOSIS — J449 Chronic obstructive pulmonary disease, unspecified: Secondary | ICD-10-CM | POA: Diagnosis not present

## 2014-12-22 LAB — CYTOLOGY - PAP

## 2015-01-03 ENCOUNTER — Ambulatory Visit (INDEPENDENT_AMBULATORY_CARE_PROVIDER_SITE_OTHER): Payer: Medicare Other | Admitting: *Deleted

## 2015-01-03 DIAGNOSIS — I4891 Unspecified atrial fibrillation: Secondary | ICD-10-CM | POA: Diagnosis not present

## 2015-01-03 DIAGNOSIS — I480 Paroxysmal atrial fibrillation: Secondary | ICD-10-CM

## 2015-01-03 DIAGNOSIS — Z5181 Encounter for therapeutic drug level monitoring: Secondary | ICD-10-CM

## 2015-01-03 DIAGNOSIS — Z7901 Long term (current) use of anticoagulants: Secondary | ICD-10-CM | POA: Diagnosis not present

## 2015-01-03 LAB — POCT INR: INR: 2.8

## 2015-01-06 DIAGNOSIS — I10 Essential (primary) hypertension: Secondary | ICD-10-CM | POA: Diagnosis not present

## 2015-01-06 DIAGNOSIS — J449 Chronic obstructive pulmonary disease, unspecified: Secondary | ICD-10-CM | POA: Diagnosis not present

## 2015-01-06 DIAGNOSIS — R739 Hyperglycemia, unspecified: Secondary | ICD-10-CM | POA: Diagnosis not present

## 2015-01-06 DIAGNOSIS — I4891 Unspecified atrial fibrillation: Secondary | ICD-10-CM | POA: Diagnosis not present

## 2015-01-06 DIAGNOSIS — M792 Neuralgia and neuritis, unspecified: Secondary | ICD-10-CM | POA: Diagnosis not present

## 2015-01-06 DIAGNOSIS — M545 Low back pain: Secondary | ICD-10-CM | POA: Diagnosis not present

## 2015-01-18 ENCOUNTER — Ambulatory Visit: Payer: Medicare Other | Admitting: Adult Health

## 2015-01-18 ENCOUNTER — Ambulatory Visit: Payer: Self-pay | Admitting: Cardiology

## 2015-01-18 DIAGNOSIS — E876 Hypokalemia: Secondary | ICD-10-CM | POA: Diagnosis not present

## 2015-01-28 ENCOUNTER — Encounter: Payer: Self-pay | Admitting: Cardiology

## 2015-01-28 ENCOUNTER — Ambulatory Visit (INDEPENDENT_AMBULATORY_CARE_PROVIDER_SITE_OTHER): Payer: Medicare Other | Admitting: Cardiology

## 2015-01-28 VITALS — BP 116/82 | HR 66 | Ht 65.5 in | Wt 170.0 lb

## 2015-01-28 DIAGNOSIS — I4891 Unspecified atrial fibrillation: Secondary | ICD-10-CM

## 2015-01-28 DIAGNOSIS — I495 Sick sinus syndrome: Secondary | ICD-10-CM

## 2015-01-28 DIAGNOSIS — Z7901 Long term (current) use of anticoagulants: Secondary | ICD-10-CM

## 2015-01-28 DIAGNOSIS — I10 Essential (primary) hypertension: Secondary | ICD-10-CM

## 2015-01-28 NOTE — Progress Notes (Signed)
Clinical Summary Ms. Negrete is a 81 y.o.female seen today for follow up of the following medical problems  1. Afib  - compliant with coumadin, denies any bleeding problems  - denies any significant palpitations  - has not been interested in NOACs  2. Tachy-Brady Syndrome  - has permanent pacemaker followed by EP Dr Ladona Ridgelaylor, last check 10/15 with normal function  - denies any lightheadedness or dizziness   3. HTN  - compliant with meds  - does not check at home regularly.    Past Medical History  Diagnosis Date  . COPD (chronic obstructive pulmonary disease)   . Arthritis   . Hypertension   . Atrial fibrillation   . Sinoatrial node dysfunction   . Hip pain, left      No Known Allergies   Current Outpatient Prescriptions  Medication Sig Dispense Refill  . lisinopril (PRINIVIL,ZESTRIL) 40 MG tablet TAKE 1 TABLET BY MOUTH EVERY DAY 90 tablet 2  . metoprolol tartrate (LOPRESSOR) 25 MG tablet TAKE 1 TABLET (25 MG TOTAL) BY MOUTH 2 TIMES A DAY 180 tablet 1  . Multiple Vitamin (MULTIVITAMIN) tablet Take 1 tablet by mouth daily.    Marland Kitchen. warfarin (COUMADIN) 4 MG tablet Take 1 tablet daily except 1 1/2 tablets on Mondays, Wednesdays and Fridays or as directed 110 tablet 3   No current facility-administered medications for this visit.     Past Surgical History  Procedure Laterality Date  . Back surgery    . Hip arthoplasty      total  . Knee arthroplasty    . Uterine polyp removal    . Eye surgery Bilateral     cataract removal     No Known Allergies    Family History  Problem Relation Age of Onset  . Prostate cancer Father   . Heart failure Father   . Heart disease Father   . Stroke Sister   . Depression Maternal Grandmother      Social History Ms. Burgett reports that she has never smoked. She has never used smokeless tobacco. Ms. Vilma MeckelBusick reports that she does not drink alcohol.   Review of Systems CONSTITUTIONAL: No weight loss, fever, chills,  weakness or fatigue.  HEENT: Eyes: No visual loss, blurred vision, double vision or yellow sclerae.No hearing loss, sneezing, congestion, runny nose or sore throat.  SKIN: No rash or itching.  CARDIOVASCULAR: per HPI RESPIRATORY: No shortness of breath, cough or sputum.  GASTROINTESTINAL: No anorexia, nausea, vomiting or diarrhea. No abdominal pain or blood.  GENITOURINARY: No burning on urination, no polyuria NEUROLOGICAL: No headache, dizziness, syncope, paralysis, ataxia, numbness or tingling in the extremities. No change in bowel or bladder control.  MUSCULOSKELETAL: No muscle, back pain, joint pain or stiffness.  LYMPHATICS: No enlarged nodes. No history of splenectomy.  PSYCHIATRIC: No history of depression or anxiety.  ENDOCRINOLOGIC: No reports of sweating, cold or heat intolerance. No polyuria or polydipsia.  Marland Kitchen.   Physical Examination p 66 bp 116/82 Wt 170 lbs BMI 28  Gen: resting comfortably, no acute distress HEENT: no scleral icterus, pupils equal round and reactive, no palptable cervical adenopathy,  CV: RRR, no m/r/g, no JVD, no carotid bruits Resp: Clear to auscultation bilaterally GI: abdomen is soft, non-tender, non-distended, normal bowel sounds, no hepatosplenomegaly MSK: extremities are warm, no edema.  Skin: warm, no rash Neuro:  no focal deficits Psych: appropriate affect     Assessment and Plan  1. Afib  - no current symptoms  -  continue current medical therapy and anticoagulation  - she prefers to stay on coumadin, is not interested at this time in NOACs  2. Tachy-brady syndrome  - no current symptoms, continue regular follow up with EP clinic   3. HTN  - at goal, continue current meds  F/u 6 months    Antoine Poche, M.D.

## 2015-01-28 NOTE — Patient Instructions (Signed)
Your physician wants you to follow-up in: 6 months with Dr.Branch You will receive a reminder letter in the mail two months in advance. If you don't receive a letter, please call our office to schedule the follow-up appointment.   Your physician recommends that you continue on your current medications as directed. Please refer to the Current Medication list given to you today.    Thank you for choosing [facility] !        

## 2015-01-31 ENCOUNTER — Ambulatory Visit (INDEPENDENT_AMBULATORY_CARE_PROVIDER_SITE_OTHER): Payer: Medicare Other | Admitting: *Deleted

## 2015-01-31 DIAGNOSIS — Z5181 Encounter for therapeutic drug level monitoring: Secondary | ICD-10-CM | POA: Diagnosis not present

## 2015-01-31 DIAGNOSIS — I4891 Unspecified atrial fibrillation: Secondary | ICD-10-CM | POA: Diagnosis not present

## 2015-01-31 DIAGNOSIS — Z7901 Long term (current) use of anticoagulants: Secondary | ICD-10-CM

## 2015-01-31 LAB — POCT INR: INR: 1.9

## 2015-02-15 ENCOUNTER — Encounter: Payer: Self-pay | Admitting: *Deleted

## 2015-02-18 ENCOUNTER — Encounter: Payer: Self-pay | Admitting: Internal Medicine

## 2015-02-18 ENCOUNTER — Ambulatory Visit (INDEPENDENT_AMBULATORY_CARE_PROVIDER_SITE_OTHER): Payer: Medicare Other | Admitting: Internal Medicine

## 2015-02-18 VITALS — BP 124/70 | HR 72 | Ht 65.0 in | Wt 167.5 lb

## 2015-02-18 DIAGNOSIS — I10 Essential (primary) hypertension: Secondary | ICD-10-CM

## 2015-02-18 DIAGNOSIS — I480 Paroxysmal atrial fibrillation: Secondary | ICD-10-CM | POA: Diagnosis not present

## 2015-02-18 DIAGNOSIS — I495 Sick sinus syndrome: Secondary | ICD-10-CM

## 2015-02-18 DIAGNOSIS — Z950 Presence of cardiac pacemaker: Secondary | ICD-10-CM | POA: Diagnosis not present

## 2015-02-18 LAB — MDC_IDC_ENUM_SESS_TYPE_INCLINIC
Battery Voltage: 2.75 V
Implantable Pulse Generator Serial Number: 1683162
Lead Channel Impedance Value: 513 Ohm
Lead Channel Pacing Threshold Amplitude: 0.5 V
Lead Channel Pacing Threshold Pulse Width: 0.5 ms
Lead Channel Sensing Intrinsic Amplitude: 12 mV
Lead Channel Setting Pacing Amplitude: 2 V
Lead Channel Setting Pacing Pulse Width: 0.5 ms
Lead Channel Setting Sensing Sensitivity: 2 mV
MDC IDC MSMT BATTERY IMPEDANCE: 2300 Ohm
MDC IDC MSMT LEADCHNL RA SENSING INTR AMPL: 0.4 mV
MDC IDC MSMT LEADCHNL RV IMPEDANCE VALUE: 501 Ohm
MDC IDC SESS DTM: 20160408110534
MDC IDC SET LEADCHNL RV PACING AMPLITUDE: 2.5 V
MDC IDC STAT BRADY RA PERCENT PACED: 1 %
MDC IDC STAT BRADY RV PERCENT PACED: 41 %

## 2015-02-18 NOTE — Progress Notes (Signed)
HPI [name] returns today for followup. She is a very pleasant 81 year old woman with symptomatic tachycardia bradycardia syndrome, atrial fibrillation, hypertension, on chronic Coumadin therapy. In the interim, she has fallen trying to get her paper from under the car. She admits to weakness. She has severe arthritis in her lumbar sacral spine and is thought that much of her weakness is related to prior back surgeries and arthritis. She uses a walker now for ambulation. She denies palpitations. No syncope. She still has chronic back pain. No Known Allergies   Current Outpatient Prescriptions  Medication Sig Dispense Refill  . lisinopril (PRINIVIL,ZESTRIL) 40 MG tablet TAKE 1 TABLET BY MOUTH EVERY DAY 90 tablet 2  . metoprolol tartrate (LOPRESSOR) 25 MG tablet TAKE 1 TABLET (25 MG TOTAL) BY MOUTH 2 TIMES A DAY 180 tablet 1  . Multiple Vitamin (MULTIVITAMIN) tablet Take 1 tablet by mouth daily.    Marland Kitchen. warfarin (COUMADIN) 4 MG tablet Take 1 tablet daily except 1 1/2 tablets on Mondays, Wednesdays and Fridays or as directed 110 tablet 3   No current facility-administered medications for this visit.     Past Medical History  Diagnosis Date  . COPD (chronic obstructive pulmonary disease)   . Arthritis   . Hypertension   . Atrial fibrillation   . Sinoatrial node dysfunction   . Hip pain, left     ROS:   All systems reviewed and negative except as noted in the HPI.   Past Surgical History  Procedure Laterality Date  . Back surgery    . Hip arthoplasty      total  . Knee arthroplasty    . Uterine polyp removal    . Eye surgery Bilateral     cataract removal     Family History  Problem Relation Age of Onset  . Prostate cancer Father   . Heart failure Father   . Heart disease Father   . Stroke Sister   . Depression Maternal Grandmother      History   Social History  . Marital Status: Widowed    Spouse Name: N/A  . Number of Children: N/A  . Years of Education: N/A    Occupational History  . Retired    Social History Main Topics  . Smoking status: Never Smoker   . Smokeless tobacco: Never Used  . Alcohol Use: No  . Drug Use: No  . Sexual Activity: Not Currently    Birth Control/ Protection: Post-menopausal   Other Topics Concern  . Not on file   Social History Narrative     BP 124/70 mmHg  Pulse 72  Ht 5\' 5"  (1.651 m)  Wt 167 lb 8 oz (75.978 kg)  BMI 27.87 kg/m2  Physical Exam:  stable appearing elderly woman, NAD HEENT: Unremarkable Neck:  6 cm JVD, no thyromegally Lungs:  Clear with no wheezes, rales, or rhonchi. HEART:  IRegular rate rhythm, no murmurs, no rubs, no clicks Abd:  soft, positive bowel sounds, no organomegally, no rebound, no guarding Ext:  2 plus pulses, no edema, no cyanosis, no clubbing Skin:  No rashes no nodules Neuro:  CN II through XII intact, motor grossly intact   DEVICE  Normal device function.  See PaceArt for details. Underlying rhythm is atrial fibrillation  Assess/Plan:

## 2015-02-18 NOTE — Patient Instructions (Addendum)
Your physician wants you to follow-up in: 1 year with Dr. Ladona Ridgelaylor.  You will receive a reminder letter in the mail two months in advance. If you don't receive a letter, please call our office to schedule the follow-up appointment.  Your physician wants you to follow-up in: [facility] in 6 months. You will receive a reminder letter in the mail two months in advance. If you don't receive a letter, please call our office to schedule the follow-up appointment.  Your physician recommends that you continue on your current medications as directed. Please refer to the Current Medication list given to you today.  Thank you for choosing [facility]!

## 2015-02-18 NOTE — Assessment & Plan Note (Signed)
Her chronic atrial fib is stable and her ventricular rate is controlled. She will continue her current meds.

## 2015-02-18 NOTE — Assessment & Plan Note (Signed)
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 Will follow. 

## 2015-02-18 NOTE — Assessment & Plan Note (Signed)
Her blood pressure is well controlled. No change in medical therapy. 

## 2015-02-21 ENCOUNTER — Ambulatory Visit (INDEPENDENT_AMBULATORY_CARE_PROVIDER_SITE_OTHER): Payer: Medicare Other | Admitting: *Deleted

## 2015-02-21 DIAGNOSIS — I4891 Unspecified atrial fibrillation: Secondary | ICD-10-CM

## 2015-02-21 DIAGNOSIS — Z7901 Long term (current) use of anticoagulants: Secondary | ICD-10-CM

## 2015-02-21 DIAGNOSIS — I480 Paroxysmal atrial fibrillation: Secondary | ICD-10-CM | POA: Diagnosis not present

## 2015-02-21 DIAGNOSIS — Z5181 Encounter for therapeutic drug level monitoring: Secondary | ICD-10-CM

## 2015-02-21 LAB — POCT INR: INR: 3.5

## 2015-03-03 ENCOUNTER — Encounter: Payer: Self-pay | Admitting: Internal Medicine

## 2015-03-14 ENCOUNTER — Ambulatory Visit (INDEPENDENT_AMBULATORY_CARE_PROVIDER_SITE_OTHER): Payer: Medicare Other | Admitting: *Deleted

## 2015-03-14 DIAGNOSIS — Z7901 Long term (current) use of anticoagulants: Secondary | ICD-10-CM | POA: Diagnosis not present

## 2015-03-14 DIAGNOSIS — I4891 Unspecified atrial fibrillation: Secondary | ICD-10-CM | POA: Diagnosis not present

## 2015-03-14 DIAGNOSIS — Z5181 Encounter for therapeutic drug level monitoring: Secondary | ICD-10-CM | POA: Diagnosis not present

## 2015-03-14 LAB — POCT INR: INR: 1.7

## 2015-04-04 ENCOUNTER — Ambulatory Visit (INDEPENDENT_AMBULATORY_CARE_PROVIDER_SITE_OTHER): Payer: Medicare Other | Admitting: *Deleted

## 2015-04-04 DIAGNOSIS — Z5181 Encounter for therapeutic drug level monitoring: Secondary | ICD-10-CM

## 2015-04-04 DIAGNOSIS — I4891 Unspecified atrial fibrillation: Secondary | ICD-10-CM

## 2015-04-04 DIAGNOSIS — Z7901 Long term (current) use of anticoagulants: Secondary | ICD-10-CM | POA: Diagnosis not present

## 2015-04-04 LAB — POCT INR: INR: 2.1

## 2015-04-21 ENCOUNTER — Other Ambulatory Visit: Payer: Self-pay | Admitting: Internal Medicine

## 2015-05-02 ENCOUNTER — Ambulatory Visit (INDEPENDENT_AMBULATORY_CARE_PROVIDER_SITE_OTHER): Payer: Medicare Other | Admitting: *Deleted

## 2015-05-02 DIAGNOSIS — Z5181 Encounter for therapeutic drug level monitoring: Secondary | ICD-10-CM

## 2015-05-02 DIAGNOSIS — I4891 Unspecified atrial fibrillation: Secondary | ICD-10-CM | POA: Diagnosis not present

## 2015-05-02 DIAGNOSIS — Z7901 Long term (current) use of anticoagulants: Secondary | ICD-10-CM | POA: Diagnosis not present

## 2015-05-02 LAB — POCT INR: INR: 1.8

## 2015-05-07 IMAGING — CR DG KNEE COMPLETE 4+V*R*
4 series · 4 of 4 positions shown · non-contrast
Comparison: 05/25/2014

CLINICAL DATA: Fall.  Pain and bruising.

EXAM:
RIGHT KNEE - COMPLETE 4+ VIEW

[view not recorded (1 of 4)]
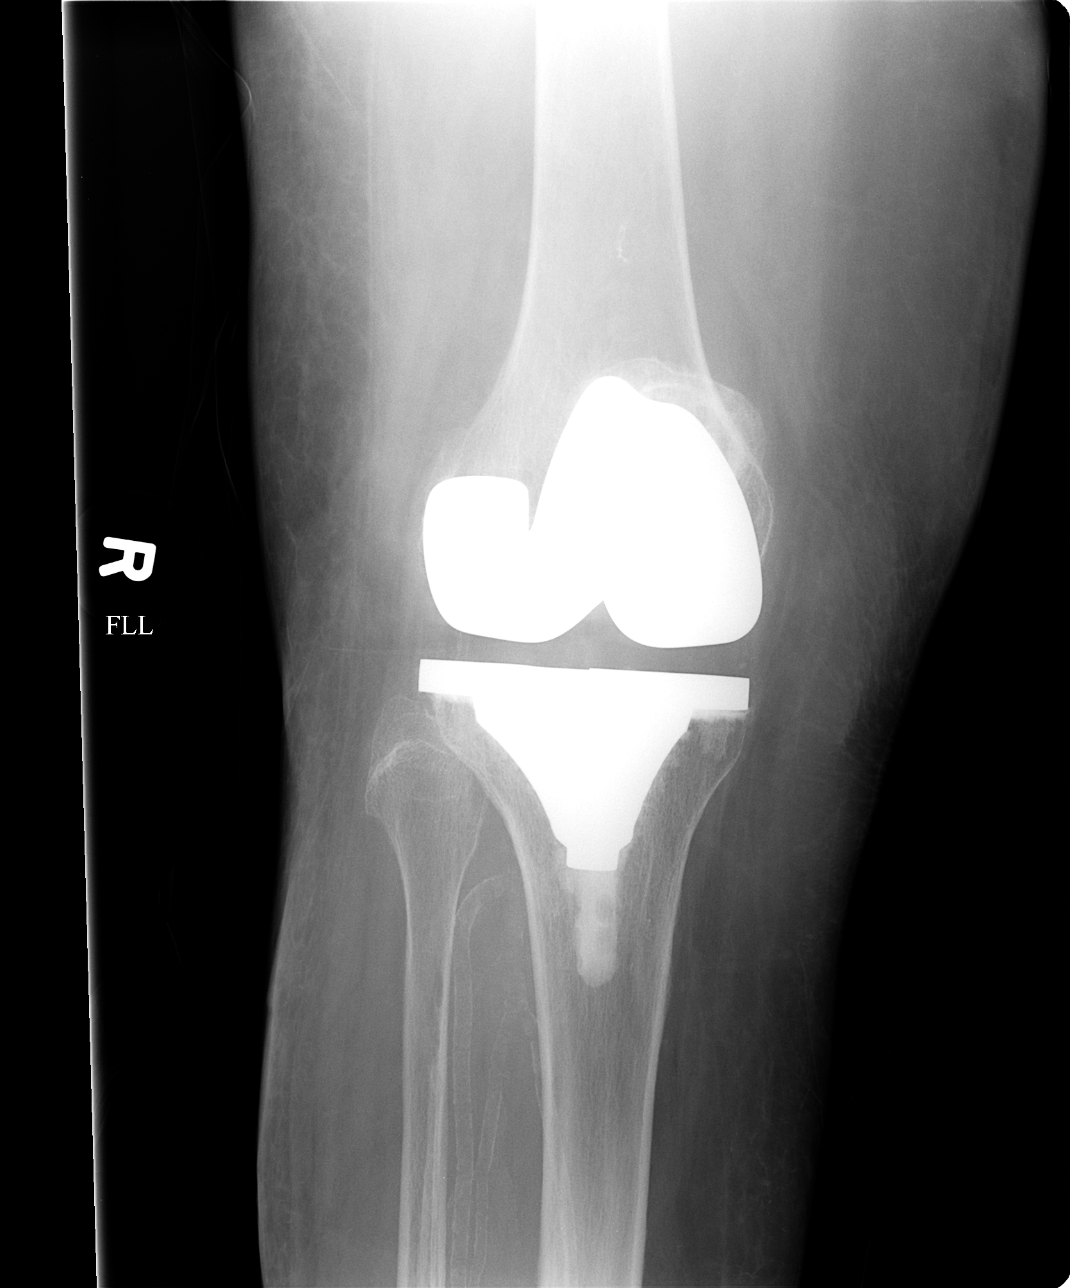

[view not recorded (2 of 4)]
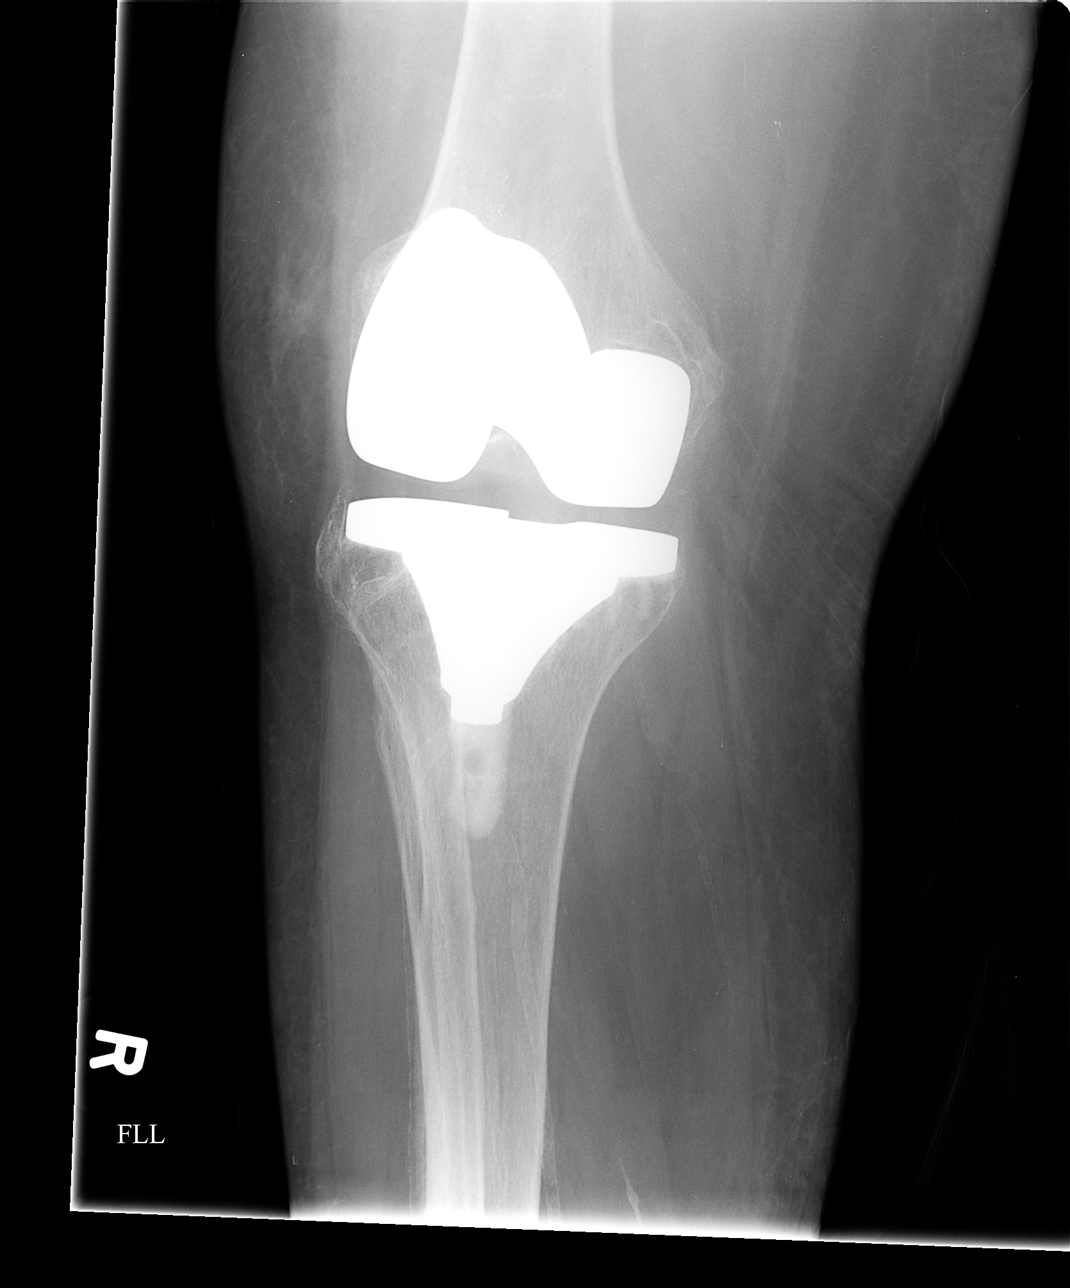

[view not recorded (3 of 4)]
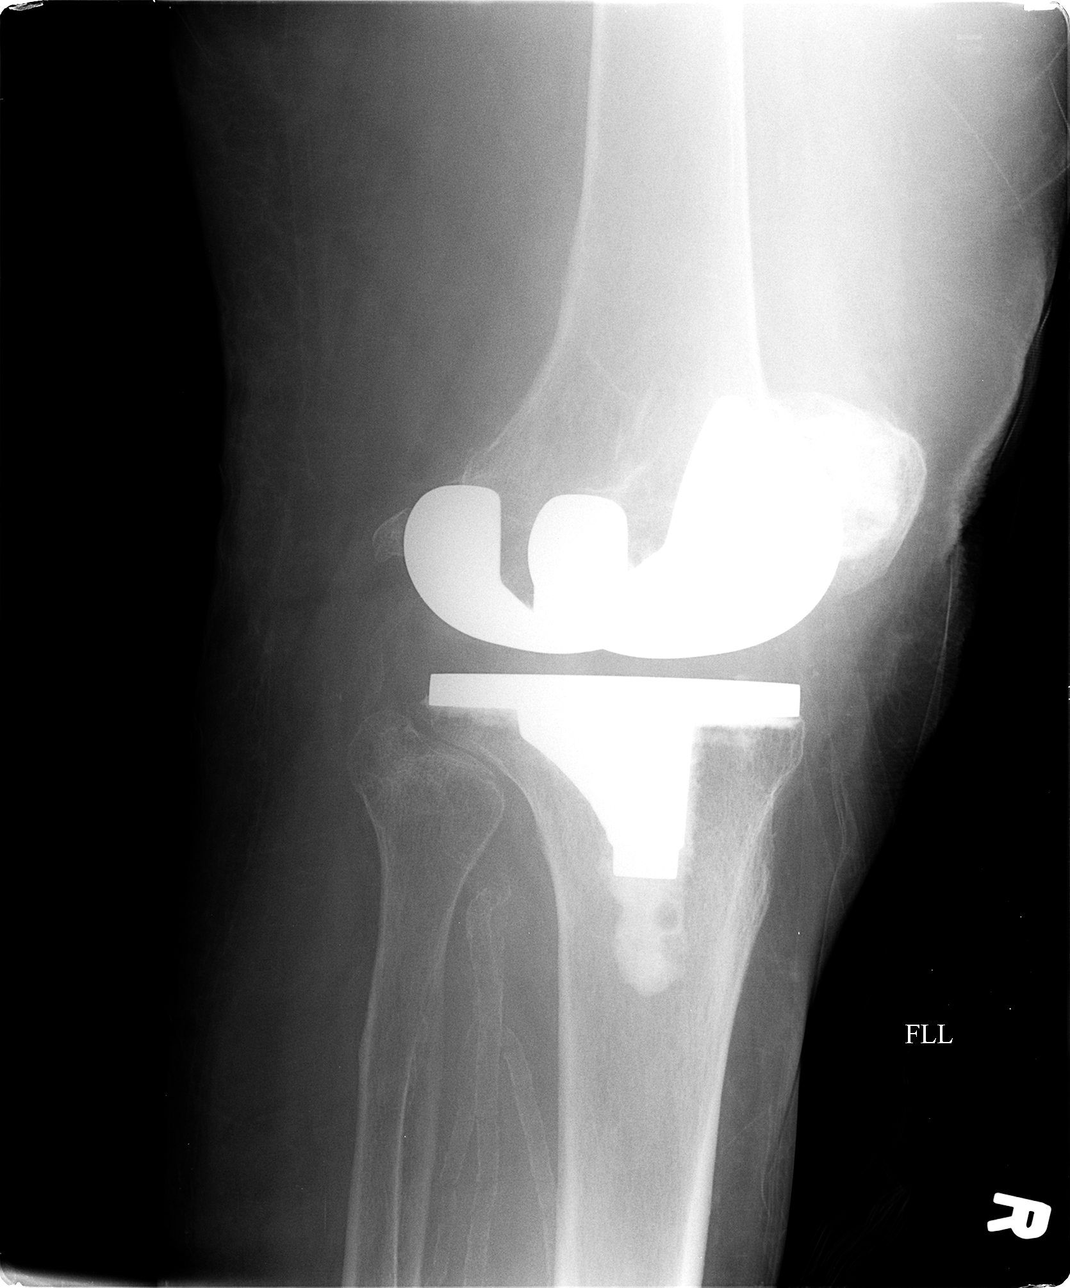

[view not recorded (4 of 4)]
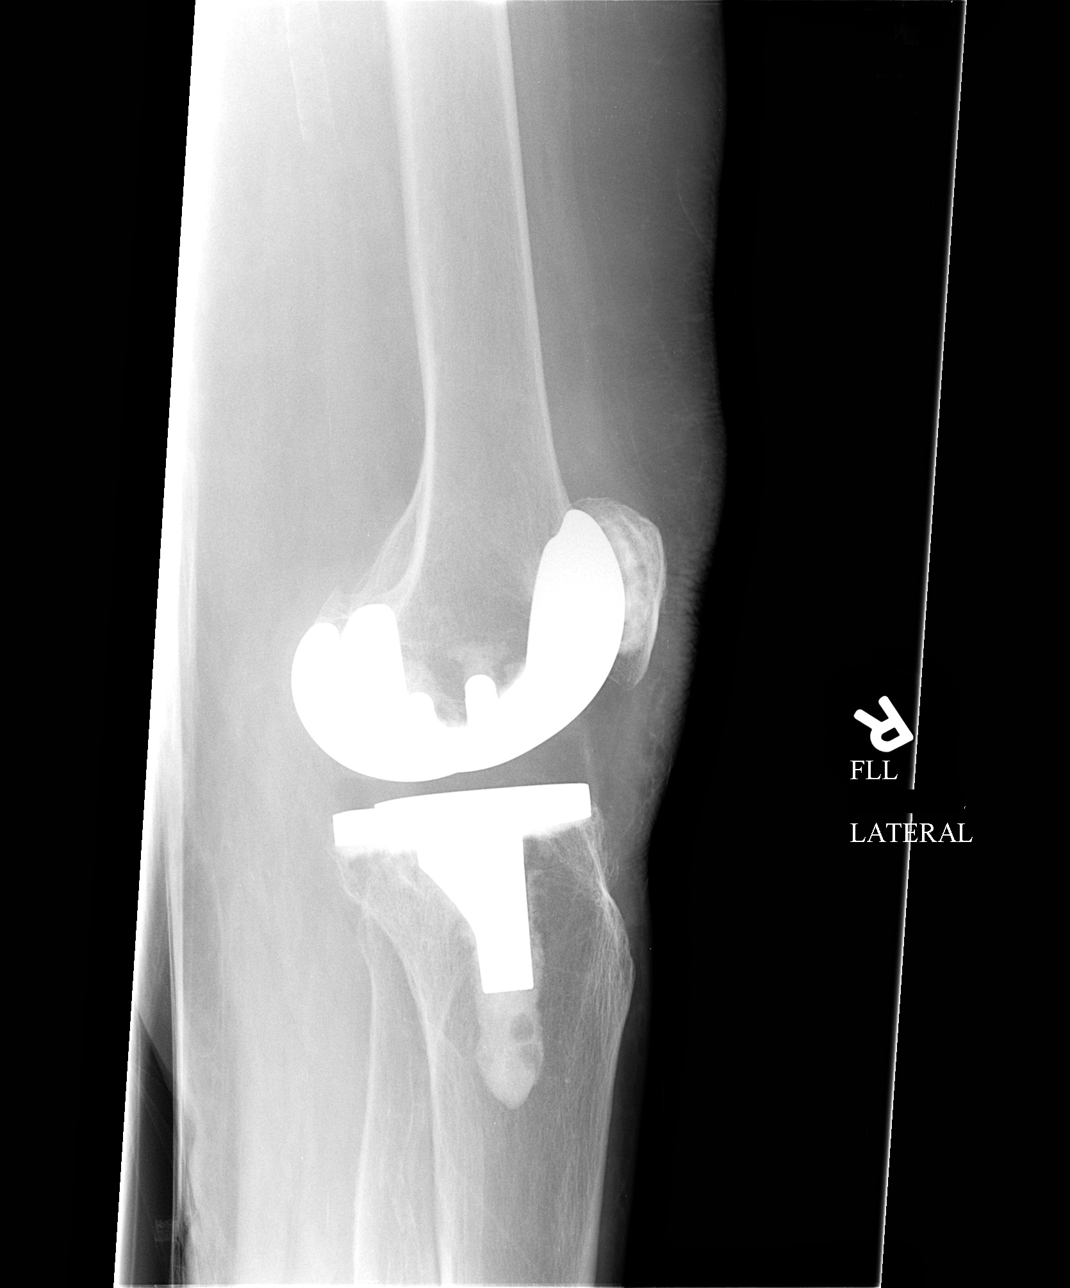

[4 of 4 positions shown; findings below may reference images not displayed]

FINDINGS: Previous total knee arthroplasty is unchanged. No effusion. No
fracture. No complicating feature.
IMPRESSION: No complications seen relative to the total knee arthroplasty.

## 2015-05-19 ENCOUNTER — Telehealth: Payer: Self-pay

## 2015-05-19 DIAGNOSIS — I10 Essential (primary) hypertension: Secondary | ICD-10-CM

## 2015-05-19 MED ORDER — METOPROLOL TARTRATE 25 MG PO TABS: ORAL_TABLET | ORAL | Status: DC

## 2015-05-19 NOTE — Telephone Encounter (Signed)
Refill complete 

## 2015-05-23 ENCOUNTER — Ambulatory Visit (INDEPENDENT_AMBULATORY_CARE_PROVIDER_SITE_OTHER): Payer: Medicare Other | Admitting: *Deleted

## 2015-05-23 DIAGNOSIS — I4891 Unspecified atrial fibrillation: Secondary | ICD-10-CM | POA: Diagnosis not present

## 2015-05-23 DIAGNOSIS — Z5181 Encounter for therapeutic drug level monitoring: Secondary | ICD-10-CM | POA: Diagnosis not present

## 2015-05-23 DIAGNOSIS — Z7901 Long term (current) use of anticoagulants: Secondary | ICD-10-CM

## 2015-05-23 LAB — POCT INR: INR: 2.2

## 2015-05-25 DIAGNOSIS — Z0000 Encounter for general adult medical examination without abnormal findings: Secondary | ICD-10-CM | POA: Diagnosis not present

## 2015-06-13 DIAGNOSIS — Z0000 Encounter for general adult medical examination without abnormal findings: Secondary | ICD-10-CM | POA: Diagnosis not present

## 2015-06-13 DIAGNOSIS — I10 Essential (primary) hypertension: Secondary | ICD-10-CM | POA: Diagnosis not present

## 2015-06-13 DIAGNOSIS — R739 Hyperglycemia, unspecified: Secondary | ICD-10-CM | POA: Diagnosis not present

## 2015-06-13 DIAGNOSIS — I4891 Unspecified atrial fibrillation: Secondary | ICD-10-CM | POA: Diagnosis not present

## 2015-06-13 DIAGNOSIS — J449 Chronic obstructive pulmonary disease, unspecified: Secondary | ICD-10-CM | POA: Diagnosis not present

## 2015-06-20 ENCOUNTER — Ambulatory Visit (INDEPENDENT_AMBULATORY_CARE_PROVIDER_SITE_OTHER): Payer: Medicare Other | Admitting: *Deleted

## 2015-06-20 DIAGNOSIS — Z5181 Encounter for therapeutic drug level monitoring: Secondary | ICD-10-CM | POA: Diagnosis not present

## 2015-06-20 DIAGNOSIS — Z7901 Long term (current) use of anticoagulants: Secondary | ICD-10-CM

## 2015-06-20 DIAGNOSIS — I4891 Unspecified atrial fibrillation: Secondary | ICD-10-CM | POA: Diagnosis not present

## 2015-06-20 LAB — POCT INR
INR: 1.8
INR: 1.8

## 2015-07-20 ENCOUNTER — Ambulatory Visit (INDEPENDENT_AMBULATORY_CARE_PROVIDER_SITE_OTHER): Payer: Medicare Other | Admitting: *Deleted

## 2015-07-20 DIAGNOSIS — Z5181 Encounter for therapeutic drug level monitoring: Secondary | ICD-10-CM | POA: Diagnosis not present

## 2015-07-20 DIAGNOSIS — Z7901 Long term (current) use of anticoagulants: Secondary | ICD-10-CM | POA: Diagnosis not present

## 2015-07-20 DIAGNOSIS — I4891 Unspecified atrial fibrillation: Secondary | ICD-10-CM | POA: Diagnosis not present

## 2015-07-20 LAB — POCT INR: INR: 1.9

## 2015-08-03 ENCOUNTER — Encounter: Payer: Self-pay | Admitting: Internal Medicine

## 2015-08-03 ENCOUNTER — Ambulatory Visit (INDEPENDENT_AMBULATORY_CARE_PROVIDER_SITE_OTHER): Payer: Medicare Other | Admitting: *Deleted

## 2015-08-03 ENCOUNTER — Ambulatory Visit (INDEPENDENT_AMBULATORY_CARE_PROVIDER_SITE_OTHER): Payer: Medicare Other | Admitting: Cardiology

## 2015-08-03 VITALS — BP 138/72 | HR 60 | Ht 65.0 in | Wt 163.0 lb

## 2015-08-03 DIAGNOSIS — Z5181 Encounter for therapeutic drug level monitoring: Secondary | ICD-10-CM

## 2015-08-03 DIAGNOSIS — I495 Sick sinus syndrome: Secondary | ICD-10-CM | POA: Diagnosis not present

## 2015-08-03 DIAGNOSIS — I4891 Unspecified atrial fibrillation: Secondary | ICD-10-CM

## 2015-08-03 DIAGNOSIS — Z136 Encounter for screening for cardiovascular disorders: Secondary | ICD-10-CM | POA: Diagnosis not present

## 2015-08-03 DIAGNOSIS — Z7901 Long term (current) use of anticoagulants: Secondary | ICD-10-CM

## 2015-08-03 LAB — POCT INR: INR: 2.2

## 2015-08-03 NOTE — Progress Notes (Signed)
Patient ID: [name], female   DOB: [birth year], 81 y.o.   MRN: [id number]     Clinical Summary [name] is a 81 y.o.female seen today for follow up of the following medical problems.   1. Afib  - compliant with coumadin, denies any bleeding problems  - denies any significant palpitations  - has not been interested in NOACs   2. Tachy-Brady Syndrome  - has permanent pacemaker followed by EP Dr [name], last check 4/16 with normal function  - denies any lightheadedness or dizziness    3. HTN  - compliant with meds  - does not check at home regularly.     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 COPD (chronic obstructive pulmonary disease)   . Arthritis   . Hypertension   . Atrial fibrillation   . Sinoatrial node dysfunction   . Hip pain, left      No Known Allergies   Current Outpatient Prescriptions  Medication Sig Dispense Refill  . lisinopril (PRINIVIL,ZESTRIL) 40 MG tablet TAKE 1 TABLET BY MOUTH EVERY DAY 90 tablet 3  . metoprolol tartrate (LOPRESSOR) 25 MG tablet TAKE 1 TABLET (25 MG TOTAL) BY MOUTH 2 TIMES A DAY 180 tablet 1  . Multiple Vitamin (MULTIVITAMIN) tablet Take 1 tablet by mouth daily.    Marland Kitchen warfarin (COUMADIN) 4 MG tablet Take 1 tablet daily except 1 1/2 tablets on Mondays, Wednesdays and Fridays or as directed 110 tablet 3   No current facility-administered medications for this visit.     Past Surgical History  Procedure Laterality Date  . Back surgery    . Hip arthoplasty      total  . Knee arthroplasty    . Uterine polyp removal    . Eye surgery Bilateral     cataract removal     No Known Allergies    Family History  Problem Relation Age of Onset  . Prostate cancer Father   . Heart failure Father   . Heart disease Father   . Stroke Sister   . Depression Maternal Grandmother      Social History [name] reports that she has never smoked. She has never used smokeless  tobacco. [name] reports that she does not drink alcohol.   Review of Systems CONSTITUTIONAL: No weight loss, fever, chills, weakness or fatigue.  HEENT: Eyes: No visual loss, blurred vision, double vision or yellow sclerae.No hearing loss, sneezing, congestion, runny nose or sore throat.  SKIN: No rash or itching.  CARDIOVASCULAR: per HPI RESPIRATORY: No shortness of breath, cough or sputum.  GASTROINTESTINAL: No anorexia, nausea, vomiting or diarrhea. No abdominal pain or blood.  GENITOURINARY: No burning on urination, no polyuria NEUROLOGICAL: No headache, dizziness, syncope, paralysis, ataxia, numbness or tingling in the extremities. No change in bowel or bladder control.  MUSCULOSKELETAL: No muscle, back pain, joint pain or stiffness.  LYMPHATICS: No enlarged nodes. No history of splenectomy.  PSYCHIATRIC: No history of depression or anxiety.  ENDOCRINOLOGIC: No reports of sweating, cold or heat intolerance. No polyuria or polydipsia.  Marland Kitchen   Physical Examination Filed Vitals:   08/03/15 0917  BP: 138/72  Pulse: 60   Filed Vitals:   08/03/15 0917  Height:  (1.651 m)  Weight: 163 lb (73.936 kg)    Gen: resting comfortably, no acute distress HEENT: no scleral icterus, pupils equal round and reactive, no palptable cervical adenopathy,  CV: RRR, no m/r/g, no jvd Resp: Clear to  auscultation bilaterally GI: abdomen is soft, non-tender, non-distended, normal bowel sounds, no hepatosplenomegaly MSK: extremities are warm, no edema.  Skin: warm, no rash Neuro:  no focal deficits Psych: appropriate affect    Assessment and Plan  1. Afib  - no current symptoms  - continue current medical therapy and anticoagulation   2. Tachy-brady syndrome  - no current symptoms, continue regular follow up with EP clinic   3. HTN  - at goal, continue current meds   F/u 6 months. Request labs from pcp   Antoine Poche, M.D.

## 2015-08-03 NOTE — Patient Instructions (Signed)
Your physician wants you to follow-up in: 6 months Dr Branch You will receive a reminder letter in the mail two months in advance. If you don't receive a letter, please call our office to schedule the follow-up appointment.    Your physician recommends that you continue on your current medications as directed. Please refer to the Current Medication list given to you today.      Thank you for choosing [facility] !         

## 2015-08-05 ENCOUNTER — Encounter: Payer: Self-pay | Admitting: Cardiology

## 2015-08-22 ENCOUNTER — Ambulatory Visit (INDEPENDENT_AMBULATORY_CARE_PROVIDER_SITE_OTHER): Payer: Medicare Other | Admitting: *Deleted

## 2015-08-22 DIAGNOSIS — I495 Sick sinus syndrome: Secondary | ICD-10-CM

## 2015-08-22 DIAGNOSIS — I4819 Other persistent atrial fibrillation: Secondary | ICD-10-CM

## 2015-08-22 DIAGNOSIS — I481 Persistent atrial fibrillation: Secondary | ICD-10-CM

## 2015-08-22 LAB — CUP PACEART INCLINIC DEVICE CHECK
Battery Voltage: 2.75 V
Brady Statistic RA Percent Paced: 1 % — CL
Date Time Interrogation Session: 20161010135045
Lead Channel Impedance Value: 516 Ohm
Lead Channel Pacing Threshold Amplitude: 0.5 V
Lead Channel Pacing Threshold Pulse Width: 0.5 ms
Lead Channel Setting Pacing Amplitude: 2 V
Lead Channel Setting Pacing Pulse Width: 0.5 ms
Lead Channel Setting Sensing Sensitivity: 2 mV
MDC IDC MSMT BATTERY IMPEDANCE: 2500 Ohm
MDC IDC MSMT LEADCHNL RA SENSING INTR AMPL: 0.2 mV
MDC IDC MSMT LEADCHNL RV IMPEDANCE VALUE: 530 Ohm
MDC IDC MSMT LEADCHNL RV SENSING INTR AMPL: 12 mV
MDC IDC SET LEADCHNL RV PACING AMPLITUDE: 2.5 V
MDC IDC STAT BRADY RV PERCENT PACED: 60 %
Pulse Gen Serial Number: 1683162

## 2015-08-22 NOTE — Progress Notes (Signed)
Pacemaker check in clinic. Normal device function. Threshold, sensing, and impedance consistent with previous measurements. Device programmed to maximize longevity. Permanent AF + warfarin. No high ventricular rates noted. Device programmed at appropriate safety margins. Histogram distribution appropriate for patient activity level. Device programmed to optimize intrinsic conduction. Estimated longevity 3-4.75 years. Patient will follow up with GT/R in 6 months.

## 2015-08-30 DIAGNOSIS — Z23 Encounter for immunization: Secondary | ICD-10-CM | POA: Diagnosis not present

## 2015-08-30 DIAGNOSIS — I10 Essential (primary) hypertension: Secondary | ICD-10-CM | POA: Diagnosis not present

## 2015-08-30 DIAGNOSIS — I4891 Unspecified atrial fibrillation: Secondary | ICD-10-CM | POA: Diagnosis not present

## 2015-08-30 DIAGNOSIS — M545 Low back pain: Secondary | ICD-10-CM | POA: Diagnosis not present

## 2015-08-30 DIAGNOSIS — J449 Chronic obstructive pulmonary disease, unspecified: Secondary | ICD-10-CM | POA: Diagnosis not present

## 2015-09-02 ENCOUNTER — Encounter: Payer: Self-pay | Admitting: Internal Medicine

## 2015-09-07 ENCOUNTER — Ambulatory Visit (INDEPENDENT_AMBULATORY_CARE_PROVIDER_SITE_OTHER): Payer: Medicare Other | Admitting: *Deleted

## 2015-09-07 DIAGNOSIS — I4891 Unspecified atrial fibrillation: Secondary | ICD-10-CM | POA: Diagnosis not present

## 2015-09-07 DIAGNOSIS — Z5181 Encounter for therapeutic drug level monitoring: Secondary | ICD-10-CM

## 2015-09-07 DIAGNOSIS — Z7901 Long term (current) use of anticoagulants: Secondary | ICD-10-CM | POA: Diagnosis not present

## 2015-09-07 LAB — POCT INR: INR: 3.1

## 2015-10-12 ENCOUNTER — Ambulatory Visit (INDEPENDENT_AMBULATORY_CARE_PROVIDER_SITE_OTHER): Payer: Medicare Other | Admitting: *Deleted

## 2015-10-12 DIAGNOSIS — I4891 Unspecified atrial fibrillation: Secondary | ICD-10-CM

## 2015-10-12 DIAGNOSIS — Z7901 Long term (current) use of anticoagulants: Secondary | ICD-10-CM

## 2015-10-12 DIAGNOSIS — Z5181 Encounter for therapeutic drug level monitoring: Secondary | ICD-10-CM | POA: Diagnosis not present

## 2015-10-12 LAB — POCT INR: INR: 3

## 2015-11-01 ENCOUNTER — Telehealth: Payer: Self-pay | Admitting: Cardiology

## 2015-11-01 MED ORDER — WARFARIN SODIUM 4 MG PO TABS: ORAL_TABLET | ORAL | Status: DC

## 2015-11-01 NOTE — Telephone Encounter (Signed)
Needs refill on Warfarin / tg

## 2015-11-12 ENCOUNTER — Other Ambulatory Visit: Payer: Self-pay | Admitting: Cardiology

## 2015-11-16 ENCOUNTER — Ambulatory Visit (INDEPENDENT_AMBULATORY_CARE_PROVIDER_SITE_OTHER): Payer: Medicare Other | Admitting: *Deleted

## 2015-11-16 DIAGNOSIS — Z7901 Long term (current) use of anticoagulants: Secondary | ICD-10-CM | POA: Diagnosis not present

## 2015-11-16 DIAGNOSIS — I4891 Unspecified atrial fibrillation: Secondary | ICD-10-CM

## 2015-11-16 DIAGNOSIS — Z5181 Encounter for therapeutic drug level monitoring: Secondary | ICD-10-CM

## 2015-11-16 LAB — POCT INR: INR: 2.3

## 2015-12-01 DIAGNOSIS — Z0000 Encounter for general adult medical examination without abnormal findings: Secondary | ICD-10-CM | POA: Diagnosis not present

## 2015-12-28 ENCOUNTER — Ambulatory Visit (INDEPENDENT_AMBULATORY_CARE_PROVIDER_SITE_OTHER): Payer: Medicare Other | Admitting: Obstetrics and Gynecology

## 2015-12-28 ENCOUNTER — Encounter: Payer: Self-pay | Admitting: Obstetrics and Gynecology

## 2015-12-28 ENCOUNTER — Ambulatory Visit (INDEPENDENT_AMBULATORY_CARE_PROVIDER_SITE_OTHER): Payer: Medicare Other | Admitting: *Deleted

## 2015-12-28 VITALS — BP 150/90 | Ht 65.5 in | Wt 159.0 lb

## 2015-12-28 DIAGNOSIS — I4891 Unspecified atrial fibrillation: Secondary | ICD-10-CM | POA: Diagnosis not present

## 2015-12-28 DIAGNOSIS — Z90722 Acquired absence of ovaries, bilateral: Secondary | ICD-10-CM

## 2015-12-28 DIAGNOSIS — Z5181 Encounter for therapeutic drug level monitoring: Secondary | ICD-10-CM | POA: Diagnosis not present

## 2015-12-28 DIAGNOSIS — Z01419 Encounter for gynecological examination (general) (routine) without abnormal findings: Secondary | ICD-10-CM

## 2015-12-28 DIAGNOSIS — Z9079 Acquired absence of other genital organ(s): Secondary | ICD-10-CM

## 2015-12-28 DIAGNOSIS — IMO0002 Reserved for concepts with insufficient information to code with codable children: Secondary | ICD-10-CM

## 2015-12-28 DIAGNOSIS — Z7901 Long term (current) use of anticoagulants: Secondary | ICD-10-CM | POA: Diagnosis not present

## 2015-12-28 LAB — POCT INR: INR: 2

## 2015-12-28 NOTE — Progress Notes (Signed)
Patient ID: [name], female   DOB: 09/27/1923, 82 y.o.   MRN: [id number] Pt here today for annual exam. Pt denies any problems or concerns at this time.

## 2015-12-28 NOTE — Progress Notes (Signed)
Patient ID: [name], female   DOB: [birth year], 82 y.o.   MRN: [id number]   Assessment:  Annual Gyn Exam    Plan:  1. pap smear not done, next pap due in 1 yr 2. return annually or prn 3    Annual mammogram advised Subjective:  [name] is a 82 y.o. female No obstetric history on file. who presents for annual exam. No LMP recorded. Patient is postmenopausal. she had a hysterectomy for endometrial cancer over 6 yrs ago with negative followup since. The patient has no complaints.  The following portions of the patient's history were reviewed and updated as appropriate: allergies, current medications, past family history, past medical history, past social history, past surgical history and problem list. Past Medical History  Diagnosis Date  . COPD (chronic obstructive pulmonary disease) (HCC)   . Arthritis   . Hypertension   . Atrial fibrillation (HCC)   . Sinoatrial node dysfunction (HCC)   . Hip pain, left     Past Surgical History  Procedure Laterality Date  . Back surgery    . Hip arthoplasty      total  . Knee arthroplasty    . Uterine polyp removal    . Eye surgery Bilateral     cataract removal     Current outpatient prescriptions:  .  lisinopril (PRINIVIL,ZESTRIL) 40 MG tablet, TAKE 1 TABLET BY MOUTH EVERY DAY, Disp: 90 tablet, Rfl: 3 .  metoprolol tartrate (LOPRESSOR) 25 MG tablet, TAKE 1 TABLET BY MOUTH TWICE A DAY, Disp: 180 tablet, Rfl: 1 .  Multiple Vitamin (MULTIVITAMIN) tablet, Take 1 tablet by mouth daily., Disp: , Rfl:  .  warfarin (COUMADIN) 4 MG tablet, Take 1 1/2 tablets daily except 1 tablet on Tuesdays or as directed, Disp: 135 tablet, Rfl: 3  Review of Systems Constitutional: negative Gastrointestinal: negative Genitourinary: negative A complete 10 system review of systems was obtained and all systems are negative except as noted in the HPI and PMH.    Objective:  Ht 5' 5.5" (1.664 m)  Wt 159 lb (72.122 kg)  BMI 26.05 kg/m2   BMI:  Body mass index is 26.05 kg/(m^2).  General Appearance: Alert, appropriate appearance for age. No acute distress HEENT: Grossly normal Neck / Thyroid:  Cardiovascular: RRR; normal S1, S2, no murmur Lungs: CTA bilaterally Back: No CVAT Breast Exam: Normal to inspection, Normal breast tissue bilaterally and No masses or nodes.No dimpling, nipple retraction or discharge. Gastrointestinal: Soft, non-tender, no masses or organomegaly Pelvic Exam: Vulva and vagina appear normal. Bimanual exam reveals a smooth vaginal cuff with normal appearance and no firmness or areas of concern on bimanual. External genitalia: normal general appearance Urinary system: urethral meatus normal Vaginal: normal mucosa without prolapse or lesions Rectal: good sphincter tone, no masses and guaiac negative Rectovaginal: normal rectal, no masses hemoccult negative. Lymphatic Exam: Non-palpable nodes in neck, clavicular, axillary, or inguinal regions  Skin: no rash or abnormalities Neurologic: Normal gait and speech, no tremor  Psychiatric: Alert and oriented, appropriate affect.  Urinalysis:Not done  Christin Bach. MD Pgr [phone] 1:43 PM   By signing my name below, I, Ronney Lion, attest that this documentation has been prepared under the direction and in the presence of Tilda Burrow, MD. Electronically Signed: Ronney Lion, ED Scribe. 12/28/2015. 2:04 PM.   I personally performed the services described in this documentation, which was SCRIBED in my presence. The recorded information has been reviewed and considered accurate. It has been edited as necessary  during review. Jonnie Kind, MD

## 2016-01-19 DIAGNOSIS — H40013 Open angle with borderline findings, low risk, bilateral: Secondary | ICD-10-CM | POA: Diagnosis not present

## 2016-01-19 DIAGNOSIS — Z961 Presence of intraocular lens: Secondary | ICD-10-CM | POA: Diagnosis not present

## 2016-01-19 DIAGNOSIS — H353122 Nonexudative age-related macular degeneration, left eye, intermediate dry stage: Secondary | ICD-10-CM | POA: Diagnosis not present

## 2016-01-19 DIAGNOSIS — H43813 Vitreous degeneration, bilateral: Secondary | ICD-10-CM | POA: Diagnosis not present

## 2016-02-02 ENCOUNTER — Ambulatory Visit (INDEPENDENT_AMBULATORY_CARE_PROVIDER_SITE_OTHER): Payer: Medicare Other | Admitting: Cardiology

## 2016-02-02 ENCOUNTER — Encounter: Payer: Self-pay | Admitting: Cardiology

## 2016-02-02 VITALS — BP 136/76 | HR 56 | Ht 65.0 in | Wt 160.0 lb

## 2016-02-02 DIAGNOSIS — I4891 Unspecified atrial fibrillation: Secondary | ICD-10-CM | POA: Diagnosis not present

## 2016-02-02 DIAGNOSIS — I10 Essential (primary) hypertension: Secondary | ICD-10-CM

## 2016-02-02 DIAGNOSIS — I495 Sick sinus syndrome: Secondary | ICD-10-CM

## 2016-02-02 NOTE — Patient Instructions (Signed)

## 2016-02-02 NOTE — Progress Notes (Signed)
Patient ID: JAZYIAH YIU, female   DOB: August 28, 1923, 82 y.o.   MRN: [id number]     Clinical Summary Ms. Husain is a 82 y.o.female seen today for follow up of the following medical problems.    1. Afib  - denies any palpitations - compliant with meds. No bleeding on coumadin   2. Tachy-Brady Syndrome  - has permanent pacemaker followed by EP Dr Ladona Ridgel, last check 10/16 with normal function  - denies any lightheadedness or dizziness since last visit   3. 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 COPD (chronic obstructive pulmonary disease) (HCC)   . Arthritis   . Hypertension   . Atrial fibrillation (HCC)   . Sinoatrial node dysfunction (HCC)   . Hip pain, left      No Known Allergies   Current Outpatient Prescriptions  Medication Sig Dispense Refill  . lisinopril (PRINIVIL,ZESTRIL) 40 MG tablet TAKE 1 TABLET BY MOUTH EVERY DAY 90 tablet 3  . metoprolol tartrate (LOPRESSOR) 25 MG tablet TAKE 1 TABLET BY MOUTH TWICE A DAY 180 tablet 1  . Multiple Vitamin (MULTIVITAMIN) tablet Take 1 tablet by mouth daily.    Marland Kitchen warfarin (COUMADIN) 4 MG tablet Take 1 1/2 tablets daily except 1 tablet on Tuesdays or as directed 135 tablet 3   No current facility-administered medications for this visit.     Past Surgical History  Procedure Laterality Date  . Back surgery    . Hip arthoplasty      total  . Knee arthroplasty    . Uterine polyp removal    . Eye surgery Bilateral     cataract removal     No Known Allergies    Family History  Problem Relation Age of Onset  . Prostate cancer Father   . Heart failure Father   . Heart disease Father   . Stroke Sister   . Depression Maternal Grandmother      Social History Ms. Dantonio reports that she has never smoked. She has never used smokeless tobacco. Ms. Sosinski reports that she does not drink alcohol.   Review of Systems CONSTITUTIONAL:  No weight loss, fever, chills, weakness or fatigue.  HEENT: Eyes: No visual loss, blurred vision, double vision or yellow sclerae.No hearing loss, sneezing, congestion, runny nose or sore throat.  SKIN: No rash or itching.  CARDIOVASCULAR: per HPI RESPIRATORY: No shortness of breath, cough or sputum.  GASTROINTESTINAL: No anorexia, nausea, vomiting or diarrhea. No abdominal pain or blood.  GENITOURINARY: No burning on urination, no polyuria NEUROLOGICAL: No headache, dizziness, syncope, paralysis, ataxia, numbness or tingling in the extremities. No change in bowel or bladder control.  MUSCULOSKELETAL: No muscle, back pain, joint pain or stiffness.  LYMPHATICS: No enlarged nodes. No history of splenectomy.  PSYCHIATRIC: No history of depression or anxiety.  ENDOCRINOLOGIC: No reports of sweating, cold or heat intolerance. No polyuria or polydipsia.  Marland Kitchen   Physical Examination Filed Vitals:   02/02/16 1102  BP: 136/76  Pulse: 56   Filed Vitals:   02/02/16 1102  Height:  (1.651 m)  Weight: 160 lb (72.576 kg)    Gen: resting comfortably, no acute distress HEENT: no scleral icterus, pupils equal round and reactive, no palptable cervical adenopathy,  CV: RRR, no m/r/g, no jvd Resp: Clear to auscultation bilaterally GI: abdomen is soft, non-tender, non-distended, normal bowel sounds, no hepatosplenomegaly MSK: extremities are warm, no  edema.  Skin: warm, no rash Neuro:  no focal deficits Psych: appropriate affect      Assessment and Plan   1. Afib  - no current symptom. Continue current meds including coumadin for stroke prevention.   2. Tachy-brady syndrome  - no current symptoms, continue device clinic f/u  3. HTN  - at goal, we will continue current meds   F/u 6 months   Antoine PocheJonathan F. Janyce Ellinger, M.D.

## 2016-02-08 ENCOUNTER — Ambulatory Visit (INDEPENDENT_AMBULATORY_CARE_PROVIDER_SITE_OTHER): Payer: Medicare Other | Admitting: *Deleted

## 2016-02-08 DIAGNOSIS — I4891 Unspecified atrial fibrillation: Secondary | ICD-10-CM

## 2016-02-08 DIAGNOSIS — Z5181 Encounter for therapeutic drug level monitoring: Secondary | ICD-10-CM | POA: Diagnosis not present

## 2016-02-08 DIAGNOSIS — Z7901 Long term (current) use of anticoagulants: Secondary | ICD-10-CM | POA: Diagnosis not present

## 2016-02-08 LAB — POCT INR: INR: 2.6

## 2016-02-29 ENCOUNTER — Encounter: Payer: Medicare Other | Admitting: Internal Medicine

## 2016-02-29 DIAGNOSIS — M545 Low back pain: Secondary | ICD-10-CM | POA: Diagnosis not present

## 2016-02-29 DIAGNOSIS — I4891 Unspecified atrial fibrillation: Secondary | ICD-10-CM | POA: Diagnosis not present

## 2016-02-29 DIAGNOSIS — M159 Polyosteoarthritis, unspecified: Secondary | ICD-10-CM | POA: Diagnosis not present

## 2016-02-29 DIAGNOSIS — J449 Chronic obstructive pulmonary disease, unspecified: Secondary | ICD-10-CM | POA: Diagnosis not present

## 2016-03-15 ENCOUNTER — Encounter: Payer: Self-pay | Admitting: Internal Medicine

## 2016-03-15 ENCOUNTER — Ambulatory Visit (INDEPENDENT_AMBULATORY_CARE_PROVIDER_SITE_OTHER): Payer: Medicare Other | Admitting: Internal Medicine

## 2016-03-15 VITALS — BP 134/75 | HR 61 | Ht 60.0 in | Wt 155.0 lb

## 2016-03-15 DIAGNOSIS — Z950 Presence of cardiac pacemaker: Secondary | ICD-10-CM

## 2016-03-15 LAB — CUP PACEART INCLINIC DEVICE CHECK
Brady Statistic RV Percent Paced: 63 %
Date Time Interrogation Session: 20170504132540
Implantable Lead Implant Date: 20070518
Implantable Lead Location: 753859
Implantable Lead Location: 753860
Lead Channel Impedance Value: 480 Ohm
Lead Channel Pacing Threshold Pulse Width: 0.5 ms
Lead Channel Sensing Intrinsic Amplitude: 0.1 mV
Lead Channel Sensing Intrinsic Amplitude: 12 mV
MDC IDC LEAD IMPLANT DT: 20070518
MDC IDC MSMT BATTERY IMPEDANCE: 2900 Ohm
MDC IDC MSMT BATTERY VOLTAGE: 2.74 V
MDC IDC MSMT LEADCHNL RA IMPEDANCE VALUE: 507 Ohm
MDC IDC MSMT LEADCHNL RV PACING THRESHOLD AMPLITUDE: 0.5 V
MDC IDC SET LEADCHNL RA PACING AMPLITUDE: 2 V
MDC IDC SET LEADCHNL RV PACING AMPLITUDE: 2.5 V
MDC IDC SET LEADCHNL RV PACING PULSEWIDTH: 0.5 ms
MDC IDC SET LEADCHNL RV SENSING SENSITIVITY: 2 mV
MDC IDC STAT BRADY RA PERCENT PACED: 1 % — AB
Pulse Gen Model: 5816
Pulse Gen Serial Number: 1683162

## 2016-03-15 NOTE — Patient Instructions (Signed)
Your physician wants you to follow-up in: 1 Year with Dr. Taylor. You will receive a reminder letter in the mail two months in advance. If you don't receive a letter, please call our office to schedule the follow-up appointment.  Your physician recommends that you schedule a follow-up appointment in: 6 Months in the Device Clinic.  Your physician recommends that you continue on your current medications as directed. Please refer to the Current Medication list given to you today.  If you need a refill on your cardiac medications before your next appointment, please call your pharmacy.  Thank you for choosing [facility]!    

## 2016-03-15 NOTE — Progress Notes (Signed)
HPI [name] returns today for followup. She is a very pleasant 82 year old woman with symptomatic tachycardia bradycardia syndrome, atrial fibrillation, hypertension, on chronic Coumadin therapy. She has severe arthritis and uses a walker now for ambulation. She denies palpitations. No syncope. She still has chronic back pain. No sob or chest pain. Minimal edema. No Known Allergies   Current Outpatient Prescriptions  Medication Sig Dispense Refill  . lisinopril (PRINIVIL,ZESTRIL) 40 MG tablet TAKE 1 TABLET BY MOUTH EVERY DAY 90 tablet 3  . metoprolol tartrate (LOPRESSOR) 25 MG tablet TAKE 1 TABLET BY MOUTH TWICE A DAY 180 tablet 1  . Multiple Vitamin (MULTIVITAMIN) tablet Take 1 tablet by mouth daily.    Marland Kitchen. warfarin (COUMADIN) 4 MG tablet Take 1 1/2 tablets daily except 1 tablet on Tuesdays or as directed 135 tablet 3   No current facility-administered medications for this visit.     Past Medical History  Diagnosis Date  . COPD (chronic obstructive pulmonary disease) (HCC)   . Arthritis   . Hypertension   . Atrial fibrillation (HCC)   . Sinoatrial node dysfunction (HCC)   . Hip pain, left     ROS:   All systems reviewed and negative except as noted in the HPI.   Past Surgical History  Procedure Laterality Date  . Back surgery    . Hip arthoplasty      total  . Knee arthroplasty    . Uterine polyp removal    . Eye surgery Bilateral     cataract removal     Family History  Problem Relation Age of Onset  . Prostate cancer Father   . Heart failure Father   . Heart disease Father   . Stroke Sister   . Depression Maternal Grandmother      Social History   Social History  . Marital Status: Widowed    Spouse Name: N/A  . Number of Children: N/A  . Years of Education: N/A   Occupational History  . Retired    Social History Main Topics  . Smoking status: Never Smoker   . Smokeless tobacco: Never Used  . Alcohol Use: No  . Drug Use: No  . Sexual Activity:  Not Currently    Birth Control/ Protection: Post-menopausal, None   Other Topics Concern  . Not on file   Social History Narrative     BP 134/75 mmHg  Pulse 61  Ht 5' (1.524 m)  Wt 155 lb (70.308 kg)  BMI 30.27 kg/m2  SpO2 98%  Physical Exam:  stable appearing elderly woman, NAD HEENT: Unremarkable Neck:  6 cm JVD, no thyromegally Lungs:  Clear with no wheezes, rales, or rhonchi. HEART:  IRegular rate rhythm, no murmurs, no rubs, no clicks Abd:  soft, positive bowel sounds, no organomegally, no rebound, no guarding Ext:  2 plus pulses, no edema, no cyanosis, no clubbing Skin:  No rashes no nodules Neuro:  CN II through XII intact, motor grossly intact   DEVICE  Normal device function.  See PaceArt for details. Underlying rhythm is atrial fibrillation  Assess/Plan:  1. Atrial fib - her ventricular rate is controlled. She will continue warfarin. She does not want to switch to a NOAC. 2. AV block - she has intermittent CHB and is pacing 2/3 or the time. 3. PPM - today we reprogrammed her PPM to VVIR. Followup in several months. 4. Coags - she will continue warfarin. No bleeding.  Leonia ReevesGregg Lai Hendriks,M.D.

## 2016-03-26 ENCOUNTER — Ambulatory Visit (INDEPENDENT_AMBULATORY_CARE_PROVIDER_SITE_OTHER): Payer: Medicare Other | Admitting: *Deleted

## 2016-03-26 DIAGNOSIS — Z7901 Long term (current) use of anticoagulants: Secondary | ICD-10-CM | POA: Diagnosis not present

## 2016-03-26 DIAGNOSIS — I4891 Unspecified atrial fibrillation: Secondary | ICD-10-CM

## 2016-03-26 DIAGNOSIS — Z5181 Encounter for therapeutic drug level monitoring: Secondary | ICD-10-CM | POA: Diagnosis not present

## 2016-03-26 LAB — POCT INR: INR: 3

## 2016-04-05 ENCOUNTER — Other Ambulatory Visit: Payer: Self-pay | Admitting: Internal Medicine

## 2016-05-07 ENCOUNTER — Other Ambulatory Visit: Payer: Self-pay

## 2016-05-07 ENCOUNTER — Ambulatory Visit (INDEPENDENT_AMBULATORY_CARE_PROVIDER_SITE_OTHER): Payer: Medicare Other | Admitting: *Deleted

## 2016-05-07 DIAGNOSIS — Z5181 Encounter for therapeutic drug level monitoring: Secondary | ICD-10-CM

## 2016-05-07 DIAGNOSIS — I4891 Unspecified atrial fibrillation: Secondary | ICD-10-CM

## 2016-05-07 DIAGNOSIS — Z7901 Long term (current) use of anticoagulants: Secondary | ICD-10-CM

## 2016-05-07 LAB — POCT INR: INR: 2.6

## 2016-05-07 MED ORDER — METOPROLOL TARTRATE 25 MG PO TABS: 25.0000 mg | ORAL_TABLET | Freq: Two times a day (BID) | ORAL | Status: DC

## 2016-05-07 NOTE — Telephone Encounter (Signed)
Refill complete 

## 2016-05-31 DIAGNOSIS — I10 Essential (primary) hypertension: Secondary | ICD-10-CM | POA: Diagnosis not present

## 2016-05-31 DIAGNOSIS — I4891 Unspecified atrial fibrillation: Secondary | ICD-10-CM | POA: Diagnosis not present

## 2016-05-31 DIAGNOSIS — J449 Chronic obstructive pulmonary disease, unspecified: Secondary | ICD-10-CM | POA: Diagnosis not present

## 2016-05-31 DIAGNOSIS — M545 Low back pain: Secondary | ICD-10-CM | POA: Diagnosis not present

## 2016-06-14 DIAGNOSIS — H02132 Senile ectropion of right lower eyelid: Secondary | ICD-10-CM | POA: Diagnosis not present

## 2016-06-14 DIAGNOSIS — H02135 Senile ectropion of left lower eyelid: Secondary | ICD-10-CM | POA: Diagnosis not present

## 2016-06-14 DIAGNOSIS — H40013 Open angle with borderline findings, low risk, bilateral: Secondary | ICD-10-CM | POA: Diagnosis not present

## 2016-06-18 ENCOUNTER — Ambulatory Visit (INDEPENDENT_AMBULATORY_CARE_PROVIDER_SITE_OTHER): Payer: Medicare Other | Admitting: *Deleted

## 2016-06-18 DIAGNOSIS — Z7901 Long term (current) use of anticoagulants: Secondary | ICD-10-CM | POA: Diagnosis not present

## 2016-06-18 DIAGNOSIS — Z5181 Encounter for therapeutic drug level monitoring: Secondary | ICD-10-CM | POA: Diagnosis not present

## 2016-06-18 DIAGNOSIS — I4891 Unspecified atrial fibrillation: Secondary | ICD-10-CM | POA: Diagnosis not present

## 2016-06-18 LAB — POCT INR: INR: 2.4

## 2016-06-26 ENCOUNTER — Encounter: Payer: Self-pay | Admitting: Cardiology

## 2016-07-26 ENCOUNTER — Ambulatory Visit: Payer: Medicare Other | Admitting: Cardiology

## 2016-07-30 ENCOUNTER — Ambulatory Visit (INDEPENDENT_AMBULATORY_CARE_PROVIDER_SITE_OTHER): Payer: Medicare Other | Admitting: *Deleted

## 2016-07-30 DIAGNOSIS — Z7901 Long term (current) use of anticoagulants: Secondary | ICD-10-CM

## 2016-07-30 DIAGNOSIS — Z5181 Encounter for therapeutic drug level monitoring: Secondary | ICD-10-CM

## 2016-07-30 DIAGNOSIS — I4891 Unspecified atrial fibrillation: Secondary | ICD-10-CM

## 2016-07-30 LAB — POCT INR: INR: 2.3

## 2016-08-16 ENCOUNTER — Ambulatory Visit (INDEPENDENT_AMBULATORY_CARE_PROVIDER_SITE_OTHER): Payer: Medicare Other | Admitting: Cardiology

## 2016-08-16 ENCOUNTER — Encounter: Payer: Self-pay | Admitting: Cardiology

## 2016-08-16 VITALS — BP 140/78 | HR 67 | Ht 65.5 in | Wt 157.0 lb

## 2016-08-16 DIAGNOSIS — I10 Essential (primary) hypertension: Secondary | ICD-10-CM | POA: Diagnosis not present

## 2016-08-16 DIAGNOSIS — I495 Sick sinus syndrome: Secondary | ICD-10-CM | POA: Diagnosis not present

## 2016-08-16 DIAGNOSIS — I4891 Unspecified atrial fibrillation: Secondary | ICD-10-CM

## 2016-08-16 NOTE — Patient Instructions (Signed)
Medication Instructions:  Your physician recommends that you continue on your current medications as directed. Please refer to the Current Medication list given to you today.   Labwork: I WILL REQUEST YOUR LAB WORK FROM YOUR PCP.  Testing/Procedures: NONE  Follow-Up: Your physician wants you to follow-up in: 6 MONTHS .  You will receive a reminder letter in the mail two months in advance. If you don't receive a letter, please call our office to schedule the follow-up appointment.   Any Other Special Instructions Will Be Listed Below (If Applicable).     If you need a refill on your cardiac medications before your next appointment, please call your pharmacy.

## 2016-08-16 NOTE — Progress Notes (Signed)
Clinical Summary Ms. Hedger is a 82 y.o.female seen today for follow up of the following medical problems.    1. Afib  - recent palpitations during recent GI illness after heavy vomiting episodes. Otherwise no other symptoms. - complaint with meds   2. Tachy-Brady Syndrome  - has permanent pacemaker followed by EP Dr Ladona Ridgelaylor, last check 5/17 with normal function  - denies any symptoms   3. HTN  - she is compliant with meds     SH: retired cardiology nurse at [employer] Past Medical History:  Diagnosis Date  . Arthritis   . Atrial fibrillation (HCC)   . COPD (chronic obstructive pulmonary disease) (HCC)   . Hip pain, left   . Hypertension   . Sinoatrial node dysfunction (HCC)      No Known Allergies   Current Outpatient Prescriptions  Medication Sig Dispense Refill  . lisinopril (PRINIVIL,ZESTRIL) 40 MG tablet TAKE 1 TABLET BY MOUTH EVERY DAY 90 tablet 3  . metoprolol tartrate (LOPRESSOR) 25 MG tablet Take 1 tablet (25 mg total) by mouth 2 (two) times daily. 180 tablet 3  . Multiple Vitamin (MULTIVITAMIN) tablet Take 1 tablet by mouth daily.    Marland Kitchen. warfarin (COUMADIN) 4 MG tablet Take 1 1/2 tablets daily except 1 tablet on Tuesdays or as directed 135 tablet 3   No current facility-administered medications for this visit.      Past Surgical History:  Procedure Laterality Date  . BACK SURGERY    . EYE SURGERY Bilateral    cataract removal  . hip arthoplasty     total  . KNEE ARTHROPLASTY    . uterine polyp removal       No Known Allergies    Family History  Problem Relation Age of Onset  . Prostate cancer Father   . Heart failure Father   . Heart disease Father   . Stroke Sister   . Depression Maternal Grandmother      Social History Ms. King reports that she has never smoked. She has never used smokeless tobacco. Ms. Vilma MeckelBusick reports that she does not drink alcohol.   Review of Systems CONSTITUTIONAL: No weight loss, fever, chills,  weakness or fatigue.  HEENT: Eyes: No visual loss, blurred vision, double vision or yellow sclerae.No hearing loss, sneezing, congestion, runny nose or sore throat.  SKIN: No rash or itching.  CARDIOVASCULAR: per HPI RESPIRATORY: No shortness of breath, cough or sputum.  GASTROINTESTINAL: No anorexia, nausea, vomiting or diarrhea. No abdominal pain or blood.  GENITOURINARY: No burning on urination, no polyuria NEUROLOGICAL: No headache, dizziness, syncope, paralysis, ataxia, numbness or tingling in the extremities. No change in bowel or bladder control.  MUSCULOSKELETAL: No muscle, back pain, joint pain or stiffness.  LYMPHATICS: No enlarged nodes. No history of splenectomy.  PSYCHIATRIC: No history of depression or anxiety.  ENDOCRINOLOGIC: No reports of sweating, cold or heat intolerance. No polyuria or polydipsia.  Marland Kitchen.   Physical Examination Vitals:   08/16/16 1129  BP: 140/78  Pulse: 67   Vitals:   08/16/16 1129  Weight: 157 lb (71.2 kg)  Height: 5' 5.5" (1.664 m)    Gen: resting comfortably, no acute distress HEENT: no scleral icterus, pupils equal round and reactive, no palptable cervical adenopathy,  CV: RRR, no m/r/g, no jvd Resp: Clear to auscultation bilaterally GI: abdomen is soft, non-tender, non-distended, normal bowel sounds, no hepatosplenomegaly MSK: extremities are warm, no edema.  Skin: warm, no rash Neuro:  no focal deficits Psych: appropriate  affect     Assessment and Plan  1. Afib  - no current symptom.  - We will continue current meds including coumadin for stroke prevention.   2. Tachy-brady syndrome  - no current symptoms, normal device check at last visit.  - continue to monitor  3. HTN  - at goal, she will continue current meds   F/u 6 months. Request labs from pcp.       Antoine Poche, M.D.

## 2016-09-10 ENCOUNTER — Ambulatory Visit (INDEPENDENT_AMBULATORY_CARE_PROVIDER_SITE_OTHER): Payer: Medicare Other | Admitting: *Deleted

## 2016-09-10 DIAGNOSIS — I4891 Unspecified atrial fibrillation: Secondary | ICD-10-CM

## 2016-09-10 DIAGNOSIS — Z7901 Long term (current) use of anticoagulants: Secondary | ICD-10-CM

## 2016-09-10 DIAGNOSIS — Z5181 Encounter for therapeutic drug level monitoring: Secondary | ICD-10-CM | POA: Diagnosis not present

## 2016-09-10 LAB — POCT INR: INR: 3.2

## 2016-09-13 ENCOUNTER — Ambulatory Visit (INDEPENDENT_AMBULATORY_CARE_PROVIDER_SITE_OTHER): Payer: Medicare Other | Admitting: *Deleted

## 2016-09-13 DIAGNOSIS — I482 Chronic atrial fibrillation: Secondary | ICD-10-CM

## 2016-09-13 DIAGNOSIS — I4821 Permanent atrial fibrillation: Secondary | ICD-10-CM

## 2016-09-13 LAB — CUP PACEART INCLINIC DEVICE CHECK
Battery Impedance: 3300 Ohm
Battery Voltage: 2.74 V
Brady Statistic RV Percent Paced: 70 %
Date Time Interrogation Session: 20171102120249
Implantable Lead Implant Date: 20070518
Implantable Lead Location: 753859
Implantable Lead Location: 753860
Implantable Pulse Generator Implant Date: 20070518
Lead Channel Impedance Value: 439 Ohm
Lead Channel Pacing Threshold Pulse Width: 0.5 ms
Lead Channel Setting Pacing Amplitude: 2.5 V
Lead Channel Setting Pacing Pulse Width: 0.5 ms
Lead Channel Setting Sensing Sensitivity: 2 mV
MDC IDC LEAD IMPLANT DT: 20070518
MDC IDC MSMT LEADCHNL RV PACING THRESHOLD AMPLITUDE: 0.5 V
MDC IDC MSMT LEADCHNL RV SENSING INTR AMPL: 11.2 mV
Pulse Gen Model: 5816
Pulse Gen Serial Number: 1683162

## 2016-09-13 NOTE — Progress Notes (Signed)
Pacemaker check in clinic. Normal device function. Threshold, sensing, and impedance consistent with previous measurements. Device programmed to maximize longevity. Permanent AF + warfarin. No high ventricular rates noted. Device programmed at appropriate safety margins. Histogram distribution appropriate for patient activity level. Device programmed to optimize intrinsic conduction. Estimated longevity 3.5-3.75 years. Patient will follow up with GT/R in 6 months.

## 2016-10-08 ENCOUNTER — Ambulatory Visit (INDEPENDENT_AMBULATORY_CARE_PROVIDER_SITE_OTHER): Payer: Medicare Other | Admitting: *Deleted

## 2016-10-08 DIAGNOSIS — Z7901 Long term (current) use of anticoagulants: Secondary | ICD-10-CM | POA: Diagnosis not present

## 2016-10-08 DIAGNOSIS — Z5181 Encounter for therapeutic drug level monitoring: Secondary | ICD-10-CM | POA: Diagnosis not present

## 2016-10-08 DIAGNOSIS — I4891 Unspecified atrial fibrillation: Secondary | ICD-10-CM

## 2016-10-08 LAB — POCT INR: INR: 2.7

## 2016-10-27 ENCOUNTER — Other Ambulatory Visit: Payer: Self-pay | Admitting: Cardiology

## 2016-11-12 ENCOUNTER — Emergency Department (HOSPITAL_COMMUNITY): Payer: Medicare Other

## 2016-11-12 ENCOUNTER — Observation Stay (HOSPITAL_COMMUNITY)
Admission: EM | Admit: 2016-11-12 | Discharge: 2016-11-15 | Disposition: A | Discharge status: Skilled Nursing Facility | Payer: Medicare Other | Attending: Internal Medicine | Admitting: Internal Medicine

## 2016-11-12 ENCOUNTER — Encounter (HOSPITAL_COMMUNITY): Payer: Self-pay | Admitting: *Deleted

## 2016-11-12 ENCOUNTER — Observation Stay (HOSPITAL_COMMUNITY): Payer: Medicare Other

## 2016-11-12 DIAGNOSIS — I10 Essential (primary) hypertension: Secondary | ICD-10-CM | POA: Diagnosis not present

## 2016-11-12 DIAGNOSIS — D62 Acute posthemorrhagic anemia: Secondary | ICD-10-CM | POA: Diagnosis not present

## 2016-11-12 DIAGNOSIS — S0003XA Contusion of scalp, initial encounter: Secondary | ICD-10-CM

## 2016-11-12 DIAGNOSIS — S32592A Other specified fracture of left pubis, initial encounter for closed fracture: Secondary | ICD-10-CM | POA: Diagnosis not present

## 2016-11-12 DIAGNOSIS — Z7901 Long term (current) use of anticoagulants: Secondary | ICD-10-CM | POA: Diagnosis not present

## 2016-11-12 DIAGNOSIS — W19XXXA Unspecified fall, initial encounter: Secondary | ICD-10-CM | POA: Diagnosis present

## 2016-11-12 DIAGNOSIS — R062 Wheezing: Secondary | ICD-10-CM

## 2016-11-12 DIAGNOSIS — Z8042 Family history of malignant neoplasm of prostate: Secondary | ICD-10-CM | POA: Insufficient documentation

## 2016-11-12 DIAGNOSIS — J449 Chronic obstructive pulmonary disease, unspecified: Secondary | ICD-10-CM | POA: Insufficient documentation

## 2016-11-12 DIAGNOSIS — I4891 Unspecified atrial fibrillation: Secondary | ICD-10-CM | POA: Diagnosis present

## 2016-11-12 DIAGNOSIS — E785 Hyperlipidemia, unspecified: Secondary | ICD-10-CM | POA: Insufficient documentation

## 2016-11-12 DIAGNOSIS — Z950 Presence of cardiac pacemaker: Secondary | ICD-10-CM | POA: Diagnosis present

## 2016-11-12 DIAGNOSIS — M1990 Unspecified osteoarthritis, unspecified site: Secondary | ICD-10-CM | POA: Diagnosis not present

## 2016-11-12 DIAGNOSIS — R296 Repeated falls: Secondary | ICD-10-CM | POA: Insufficient documentation

## 2016-11-12 DIAGNOSIS — R55 Syncope and collapse: Secondary | ICD-10-CM | POA: Diagnosis not present

## 2016-11-12 DIAGNOSIS — R3129 Other microscopic hematuria: Secondary | ICD-10-CM | POA: Diagnosis not present

## 2016-11-12 DIAGNOSIS — S0990XA Unspecified injury of head, initial encounter: Secondary | ICD-10-CM | POA: Diagnosis not present

## 2016-11-12 DIAGNOSIS — J9811 Atelectasis: Secondary | ICD-10-CM | POA: Diagnosis not present

## 2016-11-12 DIAGNOSIS — S8992XA Unspecified injury of left lower leg, initial encounter: Secondary | ICD-10-CM | POA: Diagnosis not present

## 2016-11-12 DIAGNOSIS — S0101XA Laceration without foreign body of scalp, initial encounter: Secondary | ICD-10-CM | POA: Diagnosis not present

## 2016-11-12 DIAGNOSIS — I495 Sick sinus syndrome: Secondary | ICD-10-CM | POA: Diagnosis present

## 2016-11-12 DIAGNOSIS — I951 Orthostatic hypotension: Secondary | ICD-10-CM | POA: Diagnosis not present

## 2016-11-12 DIAGNOSIS — I482 Chronic atrial fibrillation: Secondary | ICD-10-CM | POA: Insufficient documentation

## 2016-11-12 DIAGNOSIS — S0181XA Laceration without foreign body of other part of head, initial encounter: Secondary | ICD-10-CM | POA: Diagnosis not present

## 2016-11-12 DIAGNOSIS — M25562 Pain in left knee: Secondary | ICD-10-CM | POA: Diagnosis not present

## 2016-11-12 DIAGNOSIS — S79912A Unspecified injury of left hip, initial encounter: Secondary | ICD-10-CM | POA: Diagnosis not present

## 2016-11-12 DIAGNOSIS — Z8542 Personal history of malignant neoplasm of other parts of uterus: Secondary | ICD-10-CM | POA: Insufficient documentation

## 2016-11-12 DIAGNOSIS — S098XXA Other specified injuries of head, initial encounter: Secondary | ICD-10-CM | POA: Diagnosis not present

## 2016-11-12 DIAGNOSIS — Z823 Family history of stroke: Secondary | ICD-10-CM | POA: Insufficient documentation

## 2016-11-12 LAB — TROPONIN I
TROPONIN I: 0.03 ng/mL — AB (ref <0.03)
TROPONIN I: 0.04 ng/mL — AB (ref <0.03)

## 2016-11-12 LAB — CBC WITH DIFFERENTIAL/PLATELET
Basophils Absolute: 0 10*3/uL (ref 0.0–0.1)
Basophils Relative: 0 %
EOS ABS: 0 10*3/uL (ref 0.0–0.7)
Eosinophils Relative: 0 %
HCT: 35.3 % — ABNORMAL LOW (ref 36.0–46.0)
HEMOGLOBIN: 11.2 g/dL — AB (ref 12.0–15.0)
LYMPHS ABS: 0.7 10*3/uL (ref 0.7–4.0)
LYMPHS PCT: 12 %
MCH: 32.3 pg (ref 26.0–34.0)
MCHC: 31.7 g/dL (ref 30.0–36.0)
MCV: 101.7 fL — AB (ref 78.0–100.0)
MONOS PCT: 11 %
Monocytes Absolute: 0.6 10*3/uL (ref 0.1–1.0)
NEUTROS PCT: 77 %
Neutro Abs: 4.4 10*3/uL (ref 1.7–7.7)
Platelets: 196 10*3/uL (ref 150–400)
RBC: 3.47 MIL/uL — ABNORMAL LOW (ref 3.87–5.11)
RDW: 14.6 % (ref 11.5–15.5)
WBC: 5.7 10*3/uL (ref 4.0–10.5)

## 2016-11-12 LAB — URINALYSIS, ROUTINE W REFLEX MICROSCOPIC
Bilirubin Urine: NEGATIVE
Glucose, UA: NEGATIVE mg/dL
KETONES UR: 5 mg/dL — AB
Leukocytes, UA: NEGATIVE
Nitrite: NEGATIVE
PROTEIN: NEGATIVE mg/dL
Specific Gravity, Urine: 1.016 (ref 1.005–1.030)
pH: 7 (ref 5.0–8.0)

## 2016-11-12 LAB — BASIC METABOLIC PANEL
Anion gap: 6 (ref 5–15)
BUN: 15 mg/dL (ref 6–20)
CHLORIDE: 101 mmol/L (ref 101–111)
CO2: 27 mmol/L (ref 22–32)
CREATININE: 0.71 mg/dL (ref 0.44–1.00)
Calcium: 8.6 mg/dL — ABNORMAL LOW (ref 8.9–10.3)
GFR calc Af Amer: >60 mL/min (ref >60)
GFR calc non Af Amer: >60 mL/min (ref >60)
GLUCOSE: 156 mg/dL — AB (ref 65–99)
Potassium: 3.7 mmol/L (ref 3.5–5.1)
Sodium: 134 mmol/L — ABNORMAL LOW (ref 135–145)

## 2016-11-12 LAB — CK: Total CK: 93 U/L (ref 38–234)

## 2016-11-12 LAB — PROTIME-INR
INR: 2.51
PROTHROMBIN TIME: 27.6 s — AB (ref 11.4–15.2)

## 2016-11-12 MED ORDER — ACETAMINOPHEN 325 MG PO TABS
650.0000 mg | ORAL_TABLET | Freq: Once | ORAL | Status: AC
  Administered 2016-11-12: 650 mg via ORAL
  Filled 2016-11-12: qty 2

## 2016-11-12 MED ORDER — SENNA 8.6 MG PO TABS
1.0000 | ORAL_TABLET | Freq: Two times a day (BID) | ORAL | Status: DC
  Administered 2016-11-12 – 2016-11-15 (×6): 8.6 mg via ORAL
  Filled 2016-11-12 (×6): qty 1

## 2016-11-12 MED ORDER — HYDROCODONE-ACETAMINOPHEN 5-325 MG PO TABS: 1.0000 | ORAL_TABLET | ORAL | Status: DC | PRN

## 2016-11-12 MED ORDER — SODIUM CHLORIDE 0.9 % IV SOLN: INTRAVENOUS | Status: AC

## 2016-11-12 MED ORDER — ACETAMINOPHEN 650 MG RE SUPP: 650.0000 mg | Freq: Four times a day (QID) | RECTAL | Status: DC | PRN

## 2016-11-12 MED ORDER — LIDOCAINE-EPINEPHRINE (PF) 2 %-1:200000 IJ SOLN
20.0000 mL | Freq: Once | INTRAMUSCULAR | Status: DC
  Filled 2016-11-12: qty 20

## 2016-11-12 MED ORDER — LISINOPRIL 40 MG PO TABS
40.0000 mg | ORAL_TABLET | Freq: Every day | ORAL | Status: DC
  Administered 2016-11-12 – 2016-11-14 (×3): 40 mg via ORAL
  Filled 2016-11-12 (×3): qty 1

## 2016-11-12 MED ORDER — SODIUM CHLORIDE 0.9 % IV BOLUS (SEPSIS)
1000.0000 mL | Freq: Once | INTRAVENOUS | Status: AC
  Administered 2016-11-12: 1000 mL via INTRAVENOUS

## 2016-11-12 MED ORDER — ACETAMINOPHEN 325 MG PO TABS: 650.0000 mg | ORAL_TABLET | Freq: Four times a day (QID) | ORAL | Status: DC | PRN

## 2016-11-12 MED ORDER — METOPROLOL TARTRATE 25 MG PO TABS
25.0000 mg | ORAL_TABLET | Freq: Two times a day (BID) | ORAL | Status: DC
  Administered 2016-11-12 – 2016-11-15 (×6): 25 mg via ORAL
  Filled 2016-11-12 (×6): qty 1

## 2016-11-12 MED ORDER — ONDANSETRON HCL 4 MG/2ML IJ SOLN: 4.0000 mg | Freq: Four times a day (QID) | INTRAMUSCULAR | Status: DC | PRN

## 2016-11-12 MED ORDER — ONDANSETRON HCL 4 MG PO TABS: 4.0000 mg | ORAL_TABLET | Freq: Four times a day (QID) | ORAL | Status: DC | PRN

## 2016-11-12 MED ORDER — SODIUM CHLORIDE 0.9% FLUSH
3.0000 mL | Freq: Two times a day (BID) | INTRAVENOUS | Status: DC
  Administered 2016-11-12 – 2016-11-14 (×5): 3 mL via INTRAVENOUS

## 2016-11-12 NOTE — ED Notes (Signed)
Unsuccessful attempt at giving report.  

## 2016-11-12 NOTE — ED Triage Notes (Addendum)
To ED for eval after falling last pm around 1130pm. Pt thinks she hit head on toilet but not sure. Pt layed in the floor all night per family. Pt thinks she was able to get back to bed... There is no sign of pt getting into bed, per family. Family states the bathroom floor is 'covered' in blood. Pt is alert and oriented. Appears in nad. Right leg pain per pt. Pt is on coumadin. Speaking in full complete sentences. C/o left hip and knee pain. All pulses palpable . Pt conversing with staff and daughter. Able to assist staff in getting pt undressed

## 2016-11-12 NOTE — ED Notes (Signed)
Pupils 2-3, equal, round, reactive to light

## 2016-11-12 NOTE — H&P (Signed)
Date: 11/12/2016               Patient Name:  [name] MRN: [id number]  DOB: 06/29/1923 Age / Sex: 83 y.o., female   PCP: Kari Baars, MD         Medical Service: Internal Medicine Teaching Service         Attending Physician: Dr. Donnetta Hutching, MD    First Contact: Dr. Noemi Chapel Pager: [phone]  Second Contact: Dr. Deneise Lever Pager: [phone]       After Hours (After 5p/  First Contact Pager: [phone]  weekends / holidays): Second Contact Pager: [phone]   Chief Complaint: Fall   History of Present Illness: Patient is a 83 yo F with pmhx of HTN, HLD, atrial fibrillation (on warfarin), endometrial carcinoma (s/p hysterectomy), and recurrent falls (hx of prior hip fracture) who presents after a fall from home. Patient says she went to the bathroom around 11 pm last night to urinate and does not remember what happened. The next thing she remembers is waking up on the floor the next morning. Patient thought she had urinated her self because all her clothes were wet but it turned out to be blood. She pressed her life alert button and EMS arrived transported her to the ED. Patient denied any preceding symptoms of chest pain, heart palpitations, SOB, dizziness, or lightheadedness. She was in her usual state of health before the fall and denies any fevers, dysuria, or cough. She does have COPD and reports chronic wheezing that is unchanged from her baseline. Patient was complaining of left hip pain after her fall that was somewhat better after tylenol. Patient's daughter in law Spencer Sink was also present who helped provide history. Per daughter in law, patient had a remote history of recurrent falls many years ago until she was diagnosed with a-fib via Holter monitor. She has not had a fall in almost a year. She followed with Dr. Wyline Mood in [place] and is due to see him again soon. She sees him every 6 months but has not yet scheduled her next appointment.   In the ED, she was  hemodynamically stable (T 97.4, HR 96, RR 20, BP 129/65, oxygen 98% on RA). CBC was significant for a hemoglobin 11.2 which appears chronic and near her baseline. BMP was unremarkable. Troponin was 0.03. CK 93. INR was 2.51. UA showed microscopic hematuria and rare bacteria but otherwise unremarkable. CT head wo contrast was negative for intracranial bleed and no acute intracranial abnormalities. Plain films of her left hip and pelvis revealed a questionable nondisplaced fracture of her left inferior pubic ramus. Plain films of her left knee without acute abnormality, notable for severe osteoarthritis.   Meds:  Current Meds  Medication Sig  . lisinopril (PRINIVIL,ZESTRIL) 40 MG tablet TAKE 1 TABLET BY MOUTH EVERY DAY  . metoprolol tartrate (LOPRESSOR) 25 MG tablet Take 1 tablet (25 mg total) by mouth 2 (two) times daily.  . Multiple Vitamin (MULTIVITAMIN) tablet Take 1 tablet by mouth daily.  Marland Kitchen warfarin (COUMADIN) 4 MG tablet TAKE 1 AND 1/2 TABLETS BY MOUTH EVERY DAY ... EXCEPT TAKE 1 TABLET ON TUESDAYS     Allergies: Allergies as of 11/12/2016  . (No Known Allergies)   Past Medical History:  Diagnosis Date  . Arthritis   . Atrial fibrillation (HCC)   . COPD (chronic obstructive pulmonary disease) (HCC)   . Hip pain, left   . Hypertension   . Sinoatrial node dysfunction (HCC)  Family History: Dad deceased from prostate cancer. Mom passed from dementia. Sister deceased after CVA. And Second sister "dropped dead", possible heart attack.   Social History: Patient lives alone but with family near by. Her daughter in law lives across the street and checks on her often. At baseline she walks with a walker and manages all of her ADLs. She does not drive. She denies alcohol, tobacco, and illicit drug use.   Review of Systems: A complete ROS was negative except as per HPI.   Physical Exam: Blood pressure 105/76, pulse 90, temperature 97.4 F (36.3 C), temperature source Oral, resp. rate  21, SpO2 98 %. Constitutional: Elderly lady, NAD, appears comfortable HEENT: Head covered in dry blood, well wrapped with gauze and tape. PERRL, anicteric sclera.  Neck: Supple, trachea midline.  Cardiovascular: Irregular rhythm, normal rate Pulmonary/Chest: Scattered expiratory wheezing, mild bibasilar crackles  Abdominal: Soft, non tender, non distended. +BS.  Extremities: Warm and well perfused. Distal pulses intact. No edema.  Neurological: A&Ox3, CN II - XII grossly intact. No focal deficits. Strength intact and symmetric bilaterally. Decreased sensation to light touch on right lower extremity compared to left (no dermatomal pattern, unclear chronicity).  Skin: No rashes or erythema  Psychiatric: Normal mood and affect  EKG: Personally reviewed. Atrial fibrillation with a ventricular rate of 80. Diffuse T wave inversion, one PVC. Prior ECG (10/16/2016) was V-paced and difficult to compare. EKG from 05/26/2014 with a-fib and similar non specific T wave abnormalities.   CT Head Wo Contrast: IMPRESSION: No acute intracranial abnormality.  Atrophy, chronic microvascular disease.  Old right parietal infarct, stable.  DG Left Hip and Pelvis (2-3 View): IMPRESSION: Questionable nondisplaced left inferior pubic ramus fracture.  DG Left Knee (4 View): IMPRESSION: No acute abnormality.  Severe osteoarthritis.  Assessment & Plan by Problem:  Fall: In the setting of chronic atrial fibrillation on warfarin. Likely secondary to her arrhythmia. Patient denies any preceding symptoms and does not remember the event. She was in her usual state of health prior to the fall. Found to have a mid occipital scalp laceration, CT head was negative for intracranial bleed. INR in therapeutic range. Now complaining of left hip pain, also found to have a possible nondisplaced fracture of her left interior pubic ramus. Patient has a history of recurrent falls (hx of prior hip fracture) prior to her atrial  fibrillation diagnosis. Last fall was over a year ago. She is due to see her cardiology Dr. Wyline MoodBranch soon but has not scheduled an appointment. EKG here with atrial fibrillation and non specific T wave changes, similar to prior EKG. Troponin 0.03. She denies any chest pain. -- Tele monitoring  -- Repeat ECG in AM -- Neuro checks q4 hours -- Hold warfarin   -- Tylenol and Norco prn for pain  -- Senna BID -- Trend troponins q6 x 3  Atrial Fibrillation: Rates currently well controlled.  -- Continue metoprolol 25 mg BID -- Holding warfarin (INR 2.5)  HTN: -- Continue Lisinopril 40 mg daily   COPD: Follows with Dr. Juanetta GoslingHawkins (pulmonology) in [place], [place]. Has a rescue inhaler that she rarely uses. Scant wheezing on admission.  -- CXR pending   FEN: No fluids, replete lytes prn, heart healthy diet VTE ppx: SCDs Code Status: FULL   Dispo: Admit patient to Observation with expected length of stay less than 2 midnights.  Signed: Reymundo Pollarolyn Telly Jawad, MD 11/12/2016, 2:32 PM  Pager: [phone]

## 2016-11-12 NOTE — ED Notes (Signed)
Pt bending arm and cutting off flow of fluids. Advised to keep arm straight.

## 2016-11-12 NOTE — ED Notes (Signed)
Cleaned patient's head of clots of blood. Advised by EDP to place Xeroform, Gauze, and bandage to laceration.

## 2016-11-12 NOTE — ED Notes (Signed)
EDP made aware of patient wanting some tylenol for pain.

## 2016-11-12 NOTE — ED Provider Notes (Signed)
[facility]-EMERGENCY DEPT Provider Note   CSN: [id number] Arrival date & time: 11/12/16  0901     History   Chief Complaint Chief Complaint  Patient presents with  . Fall  . Head Laceration    HPI [name] is a 83 y.o. female.  Level V caveat for urgent need for intervention. Patient was found on the floor of her assisted living facility. She had fallen last night, but couldn't get up. There is large amount of blood on the occipital area of her scalp. No gross neurological deficits. No prodromal illnesses. Complains of left hip and left knee pain. She is on Coumadin for atrial fibrillation.      Past Medical History:  Diagnosis Date  . Arthritis   . Atrial fibrillation (HCC)   . COPD (chronic obstructive pulmonary disease) (HCC)   . Hip pain, left   . Hypertension   . Sinoatrial node dysfunction [facility])     Patient Active Problem List   Diagnosis Date Noted  . Annual physical exam 12/20/2014  . Hip fracture (HCC) 05/25/2014  . Encounter for therapeutic drug monitoring 01/20/2014  . H/O bilateral salpingo-oophorectomy 12/11/2013  . 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 Sinoatrial node dysfunction (HCC)   . Encounter for long-term (current) use of anticoagulants 04/30/2011  . PACEMAKER, PERMANENT 04/13/2009  . Essential hypertension 04/12/2009  . Atrial fibrillation (HCC) 04/12/2009  . COPD 04/12/2009  . ARTHRITIS 04/12/2009    Past Surgical History:  Procedure Laterality Date  . BACK SURGERY    . EYE SURGERY Bilateral    cataract removal  . hip arthoplasty     total  . KNEE ARTHROPLASTY    . uterine polyp removal      OB History    No data available       Home Medications    Prior to Admission medications   Medication Sig Start Date End Date Taking? Authorizing Provider  lisinopril (PRINIVIL,ZESTRIL) 40 MG tablet TAKE 1 TABLET BY MOUTH EVERY DAY 04/05/16  Yes Antoine Poche, MD  metoprolol tartrate (LOPRESSOR) 25 MG tablet Take 1 tablet (25 mg  total) by mouth 2 (two) times daily. 05/07/16  Yes Antoine Poche, MD  Multiple Vitamin (MULTIVITAMIN) tablet Take 1 tablet by mouth daily.   Yes Historical Provider, MD  warfarin (COUMADIN) 4 MG tablet TAKE 1 AND 1/2 TABLETS BY MOUTH EVERY DAY ... EXCEPT TAKE 1 TABLET ON TUESDAYS 10/29/16  Yes Antoine Poche, MD    Family History Family History  Problem Relation Age of Onset  . Prostate cancer Father   . Heart failure Father   . Heart disease Father   . Stroke Sister   . Depression Maternal Grandmother     Social History Social History  Substance Use Topics  . Smoking status: Never Smoker  . Smokeless tobacco: Never Used  . Alcohol use No     Allergies   Patient has no known allergies.   Review of Systems Review of Systems  Reason unable to perform ROS: urgent need for intervention.     Physical Exam Updated Vital Signs BP 105/76   Pulse 90   Temp 97.4 F (36.3 C) (Oral)   Resp 21   SpO2 98%   Physical Exam  Constitutional: She is oriented to person, place, and time. She appears well-developed and well-nourished.  HENT:  Head: Normocephalic.  Extreme amount of crusted blood on the occipital area of the scalp  Eyes: Conjunctivae are normal.  Neck: Neck supple.  Cardiovascular: Normal rate and regular rhythm.   Pulmonary/Chest: Effort normal and breath sounds normal.  Abdominal: Soft. Bowel sounds are normal.  Musculoskeletal:  Tender left lateral hip, anterior left knee  Neurological: She is alert and oriented to person, place, and time.  Skin: Skin is warm and dry.  Psychiatric: She has a normal mood and affect. Her behavior is normal.  Nursing note and vitals reviewed.    ED Treatments / Results  Labs (all labs ordered are listed, but only abnormal results are displayed) Labs Reviewed  CBC WITH DIFFERENTIAL/PLATELET - Abnormal; Notable for the following:       Result Value   RBC 3.47 (*)    Hemoglobin 11.2 (*)    HCT 35.3 (*)    MCV 101.7  (*)    All other components within normal limits  BASIC METABOLIC PANEL - Abnormal; Notable for the following:    Sodium 134 (*)    Glucose, Bld 156 (*)    Calcium 8.6 (*)    All other components within normal limits  URINALYSIS, ROUTINE W REFLEX MICROSCOPIC - Abnormal; Notable for the following:    Hgb urine dipstick SMALL (*)    Ketones, ur 5 (*)    Bacteria, UA RARE (*)    Squamous Epithelial / LPF 0-5 (*)    All other components within normal limits  TROPONIN I - Abnormal; Notable for the following:    Troponin I 0.03 (*)    All other components within normal limits  PROTIME-INR - Abnormal; Notable for the following:    Prothrombin Time 27.6 (*)    All other components within normal limits  CK    EKG  EKG Interpretation  Date/Time:  Monday November 12 2016 09:49:20 EST Ventricular Rate:  82 PR Interval:    QRS Duration: 78 QT Interval:  397 QTC Calculation: 464 R Axis:   -23 Text Interpretation:  Atrial fibrillation Borderline left axis deviation Borderline low voltage, extremity leads Abnormal T, consider ischemia, diffuse leads Confirmed by Adriana Simas  MD, Ariel Dimitri ([id number]) on 11/12/2016 10:59:47 AM       Radiology Ct Head Wo Contrast  Result Date: 11/12/2016 CLINICAL DATA:  Fall last night, hit back of head. EXAM: CT HEAD WITHOUT CONTRAST TECHNIQUE: Contiguous axial images were obtained from the base of the skull through the vertex without intravenous contrast. COMPARISON:  05/05/2011 FINDINGS: Brain: Old right parietal infarct, stable since prior study. There is atrophy and chronic small vessel disease changes. No acute intracranial abnormality. Specifically, no hemorrhage, hydrocephalus, mass lesion, acute infarction, or significant intracranial injury. Vascular: No hyperdense vessel or unexpected calcification. Skull: No acute calvarial abnormality. Sinuses/Orbits: Mild mucosal thickening throughout the paranasal sinuses. No air-fluid levels. Mastoid air cells are clear. Orbital  soft tissues unremarkable. Other: Soft tissue swelling noted in the posterior right parietal region. IMPRESSION: No acute intracranial abnormality. Atrophy, chronic microvascular disease. Old right parietal infarct, stable. Electronically Signed   By: Charlett Nose M.D.   On: 11/12/2016 10:38   Dg Knee Complete 4 Views Left  Result Date: 11/12/2016 CLINICAL DATA:  Left knee pain secondary to a fall walking to the bathroom this morning. EXAM: LEFT KNEE - COMPLETE 4+ VIEW COMPARISON:  None. FINDINGS: There is no fracture or dislocation or joint effusion. The patient has severe tricompartmental osteoarthritis of the knee. Arterial vascular calcifications around the knee. IMPRESSION: No acute abnormality.  Severe osteoarthritis. Electronically Signed   By: Francene Boyers M.D.   On: 11/12/2016 10:53   Dg  Hip Unilat W Or Wo Pelvis 2-3 Views Left  Result Date: 11/12/2016 CLINICAL DATA:  Fall EXAM: DG HIP (WITH OR WITHOUT PELVIS) 2-3V LEFT COMPARISON:  None. FINDINGS: Prior right hip replacement. Question nondisplaced fracture through the medial left inferior pubic ramus, only seen on one view. No femoral fracture. Diffuse vascular calcifications. IMPRESSION: Questionable nondisplaced left inferior pubic ramus fracture. Electronically Signed   By: Charlett NoseKevin  Dover M.D.   On: 11/12/2016 10:52    Procedures Procedures (including critical care time)   1330:  Procedure note: Large area of clotted blood in the occipital area of the scalp.  Large amount of hair trimmed..  2.0 cm laceration noted in mid occipital area.  Good hemostasis. I did not repair the laceration secondary to the instability of the clot. It was my decision to place a large turban like dressing on the area  Medications Ordered in ED Medications  lidocaine-EPINEPHrine (XYLOCAINE W/EPI) 2 %-1:200000 (PF) injection 20 mL (20 mLs Infiltration Not Given 11/12/16 1105)  sodium chloride 0.9 % bolus 1,000 mL (0 mLs Intravenous Stopped 11/12/16 1415)    acetaminophen (TYLENOL) tablet 650 mg (650 mg Oral Given 11/12/16 1301)     Initial Impression / Assessment and Plan / ED Course  I have reviewed the triage vital signs and the nursing notes.  Pertinent labs & imaging results that were available during my care of the patient were reviewed by me and considered in my medical decision making (see chart for details).    CRITICAL CARE Performed by: Donnetta HutchingOOK,Alayssa Flinchum  ?  Total critical care time: 40 minutes  Critical care time was exclusive of separately billable procedures and treating other patients.  Critical care was necessary to treat or prevent imminent or life-threatening deterioration.  Critical care was time spent personally by me on the following activities: development of treatment plan with patient and/or surrogate as well as nursing, discussions with consultants, evaluation of patient's response to treatment, examination of patient, obtaining history from patient or surrogate, ordering and performing treatments and interventions, ordering and review of laboratory studies, ordering and review of radiographic studies, pulse oximetry and re-evaluation of patient's condition. Clinical Course     Status post fall with inability to get up. Patient has an occipital hematoma, but no evidence of a subdural hematoma. Plain films of left hip reveal a possible inferior pelvic rami fracture. Patient is frail and elderly. Will admit to observation.  Final Clinical Impressions(s) / ED Diagnoses   Final diagnoses:  Fall, initial encounter  Hematoma of scalp, initial encounter    New Prescriptions New Prescriptions   No medications on file     Donnetta HutchingBrian Tytiana Coles, MD 11/12/16 1438

## 2016-11-12 NOTE — ED Notes (Signed)
Returned to ED RM 12 from radiology. Suture cart set up

## 2016-11-12 NOTE — ED Notes (Signed)
Re-bandaged patient's head.

## 2016-11-13 ENCOUNTER — Observation Stay (HOSPITAL_BASED_OUTPATIENT_CLINIC_OR_DEPARTMENT_OTHER): Payer: Medicare Other

## 2016-11-13 DIAGNOSIS — R55 Syncope and collapse: Secondary | ICD-10-CM

## 2016-11-13 DIAGNOSIS — I4891 Unspecified atrial fibrillation: Secondary | ICD-10-CM

## 2016-11-13 DIAGNOSIS — W19XXXA Unspecified fall, initial encounter: Secondary | ICD-10-CM | POA: Diagnosis not present

## 2016-11-13 DIAGNOSIS — Z90710 Acquired absence of both cervix and uterus: Secondary | ICD-10-CM

## 2016-11-13 DIAGNOSIS — Y92002 Bathroom of unspecified non-institutional (private) residence single-family (private) house as the place of occurrence of the external cause: Secondary | ICD-10-CM

## 2016-11-13 DIAGNOSIS — D62 Acute posthemorrhagic anemia: Secondary | ICD-10-CM | POA: Diagnosis not present

## 2016-11-13 DIAGNOSIS — R296 Repeated falls: Secondary | ICD-10-CM

## 2016-11-13 DIAGNOSIS — J449 Chronic obstructive pulmonary disease, unspecified: Secondary | ICD-10-CM

## 2016-11-13 DIAGNOSIS — Z8781 Personal history of (healed) traumatic fracture: Secondary | ICD-10-CM

## 2016-11-13 DIAGNOSIS — Z7901 Long term (current) use of anticoagulants: Secondary | ICD-10-CM

## 2016-11-13 DIAGNOSIS — S0101XA Laceration without foreign body of scalp, initial encounter: Secondary | ICD-10-CM

## 2016-11-13 DIAGNOSIS — Z9181 History of falling: Secondary | ICD-10-CM

## 2016-11-13 DIAGNOSIS — Z950 Presence of cardiac pacemaker: Secondary | ICD-10-CM

## 2016-11-13 DIAGNOSIS — Z8542 Personal history of malignant neoplasm of other parts of uterus: Secondary | ICD-10-CM

## 2016-11-13 LAB — ECHOCARDIOGRAM COMPLETE: Weight: 2400 oz

## 2016-11-13 LAB — COMPREHENSIVE METABOLIC PANEL
ALBUMIN: 2.6 g/dL — AB (ref 3.5–5.0)
ALK PHOS: 43 U/L (ref 38–126)
ALT: 16 U/L (ref 14–54)
ANION GAP: 4 — AB (ref 5–15)
AST: 32 U/L (ref 15–41)
BUN: 13 mg/dL (ref 6–20)
CALCIUM: 8 mg/dL — AB (ref 8.9–10.3)
CO2: 28 mmol/L (ref 22–32)
Chloride: 105 mmol/L (ref 101–111)
Creatinine, Ser: 0.72 mg/dL (ref 0.44–1.00)
GFR calc Af Amer: >60 mL/min (ref >60)
GFR calc non Af Amer: >60 mL/min (ref >60)
GLUCOSE: 107 mg/dL — AB (ref 65–99)
Potassium: 3.6 mmol/L (ref 3.5–5.1)
SODIUM: 137 mmol/L (ref 135–145)
Total Bilirubin: 0.4 mg/dL (ref 0.3–1.2)
Total Protein: 5.3 g/dL — ABNORMAL LOW (ref 6.5–8.1)

## 2016-11-13 LAB — CBC
HCT: 29 % — ABNORMAL LOW (ref 36.0–46.0)
HEMOGLOBIN: 9.1 g/dL — AB (ref 12.0–15.0)
MCH: 31.9 pg (ref 26.0–34.0)
MCHC: 31.4 g/dL (ref 30.0–36.0)
MCV: 101.8 fL — ABNORMAL HIGH (ref 78.0–100.0)
Platelets: 164 10*3/uL (ref 150–400)
RBC: 2.85 MIL/uL — AB (ref 3.87–5.11)
RDW: 14.8 % (ref 11.5–15.5)
WBC: 3.7 10*3/uL — AB (ref 4.0–10.5)

## 2016-11-13 LAB — TROPONIN I
Troponin I: 0.04 ng/mL (ref <0.03)
Troponin I: 0.04 ng/mL (ref <0.03)

## 2016-11-13 MED ORDER — IPRATROPIUM-ALBUTEROL 0.5-2.5 (3) MG/3ML IN SOLN
3.0000 mL | Freq: Four times a day (QID) | RESPIRATORY_TRACT | Status: DC | PRN
  Administered 2016-11-13 – 2016-11-14 (×2): 3 mL via RESPIRATORY_TRACT
  Filled 2016-11-13 (×2): qty 3

## 2016-11-13 NOTE — Progress Notes (Signed)
  Echocardiogram 2D Echocardiogram has been performed.  Arvil ChacoFoster, Salar Molden 11/13/2016, 2:40 PM

## 2016-11-13 NOTE — Progress Notes (Addendum)
  Date: 11/13/2016  Patient name: [name]  Medical record number: [id number]  Date of birth: [birth year]   I have seen and evaluated [name] and discussed their care with the Residency Team. In brief, patient is a 83 year old female with a past medical history of hypertension, hyperlipidemia, A. fib on warfarin, endometrial CA status post hysterectomy, recurrent falls with a history of prior hip fracture who presents with syncope 1 episode.  Patient states that she went to the bathroom to urinate at roughly 11 PM the night prior to admission and the next thing she remembered was waking up on the floor of the bathroom. Patient denied any lightheadedness or diaphoresis prior to the episode. Patient also noted that she was bleeding from her head when she woke up. No chest pain, no shortness of breath, no vertigo, no palpitations, no diaphoresis, no focal weakness, no tingling or numbness, no nausea or vomiting, no fevers or chills, no abdominal pain, no dysuria, no diarrhea or constipation.  Patient does have a history of COPD and reports chronic cough and wheezing which is unchanged from her baseline. Patient also notes some left hip pain after she woke up with a somewhat relieved with Tylenol. Patient also has a history of tachybrady syndrome for which she had a permanent pacemaker placed by EP. She had a recent normal check of her pacemaker done in November 2017. However, back in May 2017 she was noted to have intermittent complete heart block and was noted to be pacing two third of the time.   PMHx, Fam Hx, and/or Soc Hx : Per resident admit note  Vitals:   11/12/16 2014 11/13/16 0441  BP: 123/60 121/63  Pulse: 73 85  Resp: 18 18  Temp: 97.5 F (36.4 C) 98.2 F (36.8 C)   Gen.: Awake alert and oriented 3, NAD CVS: Irregularly irregular, normal heart sounds Lungs: Bilateral diffuse expiratory wheeze  Abdomen: Soft, nontender, normoactive bowel sounds Extremities: No edema  noted HEENT: Scalp laceration noted over posterior aspect of her vertex with dried blood around it  Assessment and Plan: I have seen and evaluated the patient as outlined above. I agree with the formulated Assessment and Plan as detailed in the residents' note, with the following changes:   1. Syncope: - Patient presents with unwitnessed fall in her bathroom with no preceding symptoms and loss of consciousness. Patient has been noted to have complete heart block on interrogation of her pacemaker in the past which was paced. Given her lack of prodromal symptoms I am concerned for cardiogenic etiology of her syncope. - We will get pacemaker interrogated and obtain a cardiology consult - We'll check 2-D echo to rule out valvular abnormalities especially AS given her syncope but this is less likely given no audible murmur on examination - Continue pain control for now - Patient with borderline troponin elevation likely demand in the setting of A. Fib - CT head with no hemorrhage noted - We'll hold warfarin for now given scalp laceration  2. Acute blood loss anemia: - Patient noted to have a hemoglobin of 9 down from baseline of approximately 11 likely secondary to bleeding from her scalp laceration - We'll monitor CBC daily - Transfuse for hemoglobin less than 8  - Hold warfarin for now   [name] LagosNischal Rayyan Burley, MD 1/2/20181:51 PM

## 2016-11-13 NOTE — Care Management Obs Status (Signed)
MEDICARE OBSERVATION STATUS NOTIFICATION   Patient Details  Name: [name] MRN: [id number] Date of Birth: Aug 19, 1923   Medicare Observation Status Notification Given:  Yes    MayoChapman Fitch, Cheryl Chay T, RN 11/13/2016, 3:32 PM

## 2016-11-13 NOTE — Progress Notes (Addendum)
   Subjective: [name] was seen and evaluated today at bedside. She has no acute complaints. Reports she has been in good health prior to syncopal episode however believes her chronic cough may be a little worse than usual. She could not offer any more insight into her presentation other than she was walking into the bathroom and woke up on the ground some-time later with blood on her head. Presently denies any pain.   Objective:  Vital signs in last 24 hours: Vitals:   11/12/16 1715 11/12/16 1842 11/12/16 2014 11/13/16 0441  BP: 122/63 124/64 123/60 121/63  Pulse: 77 88 73 85  Resp: 17 18 18 18   Temp:   97.5 F (36.4 C) 98.2 F (36.8 C)  TempSrc:   Oral Oral  SpO2: 94% 97% 96% 96%  Weight:    150 lb (68 kg)   General: Very pleasant elderly caucasian female, In no acute distress. Resting comfortably in bed.  HENT: EOMI. No conjunctival injection, icterus. Cardiovascular: Irregularly irregular. No murmur or rub appreciated. Pulmonary: Wheezing appreciated diffusely, no crackles or rhonchi appreciated. Unlabored breathing.  Abdomen: Soft, non-tender and non-distended. +bowel sounds. . Skin: Dried blood right scalp + posterior scalp swelling and lac. Warm, dry. No cyanosis. Psych: Mood normal and affect was mood congruent. Responds to questions appropriately.   Assessment/Plan:  Principal Problem:   Syncope and collapse Active Problems:   Atrial fibrillation (HCC)   PACEMAKER, PERMANENT   Sinoatrial node dysfunction (HCC)   Fall   Acute blood loss anemia  Syncopal Episode Unwitnessed fall in bathroom with reportedly no preceding symptoms. LOC however CT head negative for intracranial pathology. Unclear etiology at this time however suspect cardiac etiology.  -Interrogate pacemaker -ECHO -Tele review did not show arrhythmia consistent with syncopal episode  Sick-Sinus Syndrome with Pacemaker Placed 8-9 years ago per patient. Chart review shows note from her EP Dr.  Ladona Ridgelaylor that she has AV block with intermittent CHB and as of 03/2016, was pacing 2/3 of the time.  Chart review shows that pacemaker last interrogated 09/13/16 and was functioning within accepted parameters. Estimated longevity at that time was 3.5 years. Her cardiologist office was contacted who rec pacemaker interrogation. They will follow -f/u interrogation -ECHO   Atrial Fibrillation On chronic coumadin and is managed by [facility]. INR yesterday 2.5, will repeat this tomorrow and adjust dosing accordingly. Pt had appointment scheduled for tomorrow for an INR check. This office has been contacted and notified of pts hospitalization and inability to make it to her appt tomorrow.  -PT/INR in AM -Holding warfarin in setting of scalp lac  Fall, ? Pubic rami fracture Patient reports her pain is well controlled.  -PT/OT to assist with dispo. Rec SNF vs 24-hr assistance.   COPD Audible wheezing. Reports she has inhalers at home however has not used them 'in some time.' CXR negative for PNA -Duonebs Q6H PRN  Acute blood loss anemia Hb 9 today, down from baseline of 11. This is likely secondary to scalp lac. Bleeding presently controlled.  -Transfuse for Hb<8 -Holding warfarin -CBC in am  Dispo: Anticipated discharge pending ECHO and pacemaker interrogation results.   Inger Wiest, DO 11/13/2016, 11:12 AM Pager: [phone]

## 2016-11-13 NOTE — Evaluation (Signed)
Occupational Therapy Evaluation Patient Details Name: [name] MRN: [id number] DOB: Jun 23, 1923 Today's Date: 11/13/2016    History of Present Illness  Patient is a 83 yo F with pmhx of HTN, HLD, atrial fibrillation (on warfarin), endometrial carcinoma (s/p hysterectomy), and recurrent falls (hx of prior hip fracture) who presents after a fall from home. CT head wo contrast was negative for intracranial bleed and no acute intracranial abnormalities. Plain films of her left hip and pelvis revealed a questionable nondisplaced fracture of her left inferior pubic ramus. Plain films of her left knee without acute abnormality, notable for severe osteoarthritis   Clinical Impression   This 83 yo female admitted with above presents to acute with deficits below (see OT problem list) thus affecting her PLOF of of Mod I. She will benefit from acute OT with follow up OT at SNF to get back to PLOF.     Follow Up Recommendations  SNF;Supervision/Assistance - 24 hour    Equipment Recommendations  None recommended by OT       Precautions / Restrictions Precautions Precautions: Fall Precaution Comments: Last time she fell was Christmas 2016 Restrictions Weight Bearing Restrictions: No      Mobility Bed Mobility Overal bed mobility: Needs Assistance Bed Mobility: Rolling;Sidelying to Sit;Sit to Supine Rolling: Min assist (with bed rail) Sidelying to sit: Mod assist   Sit to supine: Min assist (LLE)      Transfers Overall transfer level: Needs assistance Equipment used: Rolling walker (2 wheeled) Transfers: Sit to/from UGI CorporationStand;Stand Pivot Transfers Sit to Stand: Min assist Stand pivot transfers: Min assist            Balance Overall balance assessment: Needs assistance Sitting-balance support: Feet supported;Single extremity supported Sitting balance-Leahy Scale: Poor     Standing balance support: Bilateral upper extremity supported;During functional activity Standing  balance-Leahy Scale: Poor                              ADL Overall ADL's : Needs assistance/impaired Eating/Feeding: Independent Eating/Feeding Details (indicate cue type and reason): supported sitting Grooming: Set up;Supervision/safety Grooming Details (indicate cue type and reason): supported sitting Upper Body Bathing: Set up;Supervision/ safety Upper Body Bathing Details (indicate cue type and reason): supported sitting Lower Body Bathing: Moderate assistance (min A sit <>stand)   Upper Body Dressing : Moderate assistance Upper Body Dressing Details (indicate cue type and reason): supported sitting Lower Body Dressing: Maximal assistance Lower Body Dressing Details (indicate cue type and reason): Min A sit<>stand Toilet Transfer: Minimal assistance;Stand-pivot   Toileting- Clothing Manipulation and Hygiene: Maximal assistance Toileting - Clothing Manipulation Details (indicate cue type and reason): min A sit<>stand                       Pertinent Vitals/Pain Pain Assessment: Faces Faces Pain Scale: Hurts little more Pain Location: left hip Pain Descriptors / Indicators: Sore Pain Intervention(s): Limited activity within patient's tolerance;Monitored during session;Repositioned     Hand Dominance Right   Extremity/Trunk Assessment Upper Extremity Assessment Upper Extremity Assessment: Overall WFL for tasks assessed   Lower Extremity Assessment Lower Extremity Assessment: Defer to PT evaluation       Communication Communication Communication: No difficulties   Cognition Arousal/Alertness: Awake/alert Behavior During Therapy: WFL for tasks assessed/performed Overall Cognitive Status: Within Functional Limits for tasks assessed  Home Living Family/patient expects to be discharged to:: Skilled nursing facility Living Arrangements: Alone Available Help at Discharge: Family;Available  PRN/intermittently Type of Home: House             Bathroom Shower/Tub: Walk-in Human resources officer: Standard     Home Equipment: Emergency planning/management officer - 2 wheels;Bedside commode          Prior Functioning/Environment Level of Independence: Independent with assistive device(s)                 OT Problem List: Decreased strength;Pain;Decreased range of motion;Impaired balance (sitting and/or standing)   OT Treatment/Interventions: Self-care/ADL training;Balance training;Therapeutic activities;DME and/or AE instruction;Patient/family education    OT Goals(Current goals can be found in the care plan section) Acute Rehab OT Goals Patient Stated Goal: really wants to go home, but realizes she cannot the way she is right now OT Goal Formulation: With patient Time For Goal Achievement: 11/27/16 Potential to Achieve Goals: Good  OT Frequency: Min 2X/week   Barriers to D/C: Decreased caregiver support             End of Session Equipment Utilized During Treatment: Gait belt;Rolling walker Nurse Communication: Mobility status (D/C recommendation SNF)  Activity Tolerance: Patient tolerated treatment well Patient left: in bed;with call bell/phone within reach;with bed alarm set (getting ready to have an echo done)   Time: 1346-1414 OT Time Calculation (min): 28 min Charges:  OT General Charges $OT Visit: 1 Procedure OT Evaluation $OT Eval Moderate Complexity: 1 Procedure OT Treatments $Self Care/Home Management : 8-22 mins  Evette Georges [phone] 11/13/2016, 2:24 PM

## 2016-11-13 NOTE — Progress Notes (Signed)
PT Cancellation Note  Patient Details Name: [name] H Humber MRN: [id number] DOB: [birth year]   Cancelled Treatment:    Reason Eval/Treat Not Completed: Medical issues which prohibited therapy. Pt with elevated Troponin levels, trending up, therapy held at this time. Will attempt PT eval as medically appropriate.   Rosalin Buster 11/13/2016, 8:04 AM

## 2016-11-14 ENCOUNTER — Observation Stay (HOSPITAL_BASED_OUTPATIENT_CLINIC_OR_DEPARTMENT_OTHER): Payer: Medicare Other

## 2016-11-14 ENCOUNTER — Telehealth: Payer: Self-pay | Admitting: Internal Medicine

## 2016-11-14 DIAGNOSIS — D62 Acute posthemorrhagic anemia: Secondary | ICD-10-CM | POA: Diagnosis not present

## 2016-11-14 DIAGNOSIS — S0101XA Laceration without foreign body of scalp, initial encounter: Secondary | ICD-10-CM | POA: Diagnosis not present

## 2016-11-14 DIAGNOSIS — R55 Syncope and collapse: Secondary | ICD-10-CM

## 2016-11-14 DIAGNOSIS — I482 Chronic atrial fibrillation: Secondary | ICD-10-CM | POA: Diagnosis not present

## 2016-11-14 DIAGNOSIS — S32592A Other specified fracture of left pubis, initial encounter for closed fracture: Secondary | ICD-10-CM | POA: Diagnosis not present

## 2016-11-14 LAB — CBC
HCT: 28.9 % — ABNORMAL LOW (ref 36.0–46.0)
Hemoglobin: 9.1 g/dL — ABNORMAL LOW (ref 12.0–15.0)
MCH: 32 pg (ref 26.0–34.0)
MCHC: 31.5 g/dL (ref 30.0–36.0)
MCV: 101.8 fL — ABNORMAL HIGH (ref 78.0–100.0)
PLATELETS: 158 10*3/uL (ref 150–400)
RBC: 2.84 MIL/uL — ABNORMAL LOW (ref 3.87–5.11)
RDW: 14.8 % (ref 11.5–15.5)
WBC: 3.2 10*3/uL — AB (ref 4.0–10.5)

## 2016-11-14 LAB — PROTIME-INR
INR: 1.19
Prothrombin Time: 15.2 seconds (ref 11.4–15.2)

## 2016-11-14 LAB — GLUCOSE, CAPILLARY: Glucose-Capillary: 94 mg/dL (ref 65–99)

## 2016-11-14 MED ORDER — WARFARIN SODIUM 4 MG PO TABS: ORAL_TABLET | ORAL | 3 refills | Status: DC

## 2016-11-14 NOTE — Clinical Social Work Note (Signed)
Clinical Social Work Assessment  Patient Details  Name: XOCHIL SHANKER MRN: [id number] Date of Birth: [birth year]  Date of referral:  11/14/16               Reason for consult:  Discharge Planning                Permission sought to share information with:  Family Supports Permission granted to share information::  Yes, Verbal Permission Granted  Name::     Sarepta::     Relationship::  step-son  Contact Information:  [phone]  Housing/Transportation Living arrangements for the past 2 months:  [place] of Information:  Patient Patient Interpreter Needed:  None Criminal Activity/Legal Involvement Pertinent to Current Situation/Hospitalization:  No - Comment as needed Significant Relationships:  Adult Children Lives with:    Do you feel safe going back to the place where you live?  Yes Need for family participation in patient care:  No (Coment)  Care giving concerns:  Patent stated she has step-children that are supportive  Facilities manager / plan:  Clinical Social Worker met patient at bedside to offer support and discuss patients needs at discharge. Patent stated she lives at home by herself but has family in the area. Patient stated she is aware that she needs 24 hr supervision and would like to go back home after d/c but knows SNF would be better for her. Patient agreeable to SNF but would like to stay in the area. CSW to complete necessary paperwork and initiate SNF search on patient behalf. CSW to follow up with patient once bed offers are available. CSW remains available for support and to facilitate patient discharge needs once medically stable.   Employment status:  Retired Forensic scientist:  Medicare PT Recommendations:  [facility] / Referral to community resources:  [place]  Patient/Family's Response to care: Patient verbalized appreciation and understanding for CSW role and  involvement in care. Patient agreeable with current discharge plan to SNF following discharge.   Patient/Family's Understanding of and Emotional Response to Diagnosis, Current Treatment, and Prognosis:  Patient with good understanding of current medical state and limitations around most recent hospitalization. Patient is agreeable with SNF placement in hopes of transitioning back home   Emotional Assessment Appearance:  Appears stated age Attitude/Demeanor/Rapport:  Other Affect (typically observed):  Calm Orientation:  Oriented to Self, Oriented to Place, Oriented to  Time, Oriented to Situation Alcohol / Substance use:  Not Applicable Psych involvement (Current and /or in the community):  No (Comment)  Discharge Needs  Concerns to be addressed:  No discharge needs identified Readmission within the last 30 days:  No Current discharge risk:  None Barriers to Discharge:  No Barriers Identified   Rhea Pink, MSW,  [place]

## 2016-11-14 NOTE — NC FL2 (Signed)
[place] MEDICAID FL2 LEVEL OF CARE SCREENING TOOL     IDENTIFICATION  Patient Name: [name] Birthdate: 1923-10-17 Sex: female Admission Date (Current Location): 11/12/2016  [facility] and [place] Number:  Producer, television/film/video and Address:  [facility], [address], [place], [place] [address]      Provider Number: [id number]  Attending Physician Name and Address:  Earl Lagos, MD  Relative Name and Phone Number:       Current Level of Care: Hospital Recommended Level of Care: Skilled Nursing Facility Prior Approval Number:    Date Approved/Denied:   PASRR Number: [id number]  Discharge Plan: SNF    Current Diagnoses: Patient Active Problem List   Diagnosis Date Noted  . Acute blood loss anemia 11/13/2016  . Syncope and collapse 11/13/2016  . Fall 11/12/2016  . Annual physical exam 12/20/2014  . Hip fracture (HCC) 05/25/2014  . Encounter for therapeutic drug monitoring 01/20/2014  . H/O bilateral salpingo-oophorectomy 12/11/2013  . 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 Sinoatrial node dysfunction (HCC)   . Encounter for long-term (current) use of anticoagulants 04/30/2011  . PACEMAKER, PERMANENT 04/13/2009  . Essential hypertension 04/12/2009  . Atrial fibrillation (HCC) 04/12/2009  . COPD 04/12/2009  . ARTHRITIS 04/12/2009    Orientation RESPIRATION BLADDER Height & Weight     Self, Time, Place, Situation  Normal Continent Weight: 142 lb 14.4 oz (64.8 kg) Height:     BEHAVIORAL SYMPTOMS/MOOD NEUROLOGICAL BOWEL NUTRITION STATUS      Continent Diet (heart Healthy)  AMBULATORY STATUS COMMUNICATION OF NEEDS Skin   Limited Assist Verbally Normal                       Personal Care Assistance Level of Assistance  Bathing, Feeding, Dressing Bathing Assistance: Limited assistance Feeding assistance: Independent Dressing Assistance: Limited assistance     Functional Limitations Info  Sight, Hearing, Speech Sight  Info: Adequate Hearing Info: Adequate Speech Info: Adequate    SPECIAL CARE FACTORS FREQUENCY  PT (By licensed PT), OT (By licensed OT)     PT Frequency: 5x week OT Frequency: 5x week            Contractures Contractures Info: Not present    Additional Factors Info    Code Status Info: Full Code             Current Medications (11/14/2016):  This is the current hospital active medication list Current Facility-Administered Medications  Medication Dose Route Frequency Provider Last Rate Last Dose  . acetaminophen (TYLENOL) tablet 650 mg  650 mg Oral Q6H PRN Deneise Lever, MD       Or  . acetaminophen (TYLENOL) suppository 650 mg  650 mg Rectal Q6H PRN Deneise Lever, MD      . HYDROcodone-acetaminophen (NORCO/VICODIN) 5-325 MG per tablet 1-2 tablet  1-2 tablet Oral Q4H PRN Deneise Lever, MD      . ipratropium-albuterol (DUONEB) 0.5-2.5 (3) MG/3ML nebulizer solution 3 mL  3 mL Nebulization Q6H PRN Althia Forts, MD   3 mL at 11/13/16 1348  . lidocaine-EPINEPHrine (XYLOCAINE W/EPI) 2 %-1:200000 (PF) injection 20 mL  20 mL Infiltration Once Donnetta Hutching, MD      . lisinopril (PRINIVIL,ZESTRIL) tablet 40 mg  40 mg Oral Daily Deneise Lever, MD   40 mg at 11/13/16 2158  . metoprolol tartrate (LOPRESSOR) tablet 25 mg  25 mg Oral BID Deneise Lever, MD   25 mg at 11/14/16 0947  . ondansetron (  ZOFRAN) tablet 4 mg  4 mg Oral Q6H PRN Deneise LeverParth Saraiya, MD       Or  . ondansetron (ZOFRAN) injection 4 mg  4 mg Intravenous Q6H PRN Deneise LeverParth Saraiya, MD      . senna (SENOKOT) tablet 8.6 mg  1 tablet Oral BID Deneise LeverParth Saraiya, MD   8.6 mg at 11/14/16 0947  . sodium chloride flush (NS) 0.9 % injection 3 mL  3 mL Intravenous Q12H Deneise LeverParth Saraiya, MD   3 mL at 11/14/16 0947     Discharge Medications: Please see discharge summary for a list of discharge medications.  Relevant Imaging Results:  Relevant Lab Results:   Additional Information SS#[id number]  Althea CharonAshley C Gwendolen Hewlett, LCSW

## 2016-11-14 NOTE — Discharge Instructions (Signed)
Please resume taking Warfarin 11/18/2016 if your bleeding continues to be controlled from your scalp laceration. You will need to have your INR monitored by your skilled nursing facility as it needs to reach a range of 2-3. Please follow all directions and therapies provided to you by your skilled nursing facility and please be sure to communicate if you are in any discomfort. You will need to follow-up with your cardiologist and primary care physician after discharge

## 2016-11-14 NOTE — Telephone Encounter (Signed)
New message      Dr Vincente LibertyMolt said that pt had her pacemaker interrogated by Dr Ladona Ridgelaylor yesterday but there are not notes in the computer.  Calling to ask Dr Ladona Ridgelaylor to please put the results in the computer so that she can see them.  If any problems, please call

## 2016-11-14 NOTE — Telephone Encounter (Signed)
Page sent with [facility] phone number.  Dr. Vincente LibertyMolt returned call.  She is the [facility]'s admitting [facility] and cannot d/c the patient until she has a copy of the PPM interrogation from yesterday.  Advised that we do not have a copy as the check was performed [facility], but will contact the local [employer] rep.  Dr. Vincente LibertyMolt would like the report faxed to [phone].  Will contact rep and request that he fax a copy.

## 2016-11-14 NOTE — Progress Notes (Signed)
**  Preliminary report by tech**  Carotid artery duplex complete. Findings are consistent with a 1-39 percent stenosis involving the right internal carotid artery and the left internal carotid artery. The vertebral arteries demonstrate antegrade flow.  11/14/16 4:49 PM [name] RVT

## 2016-11-14 NOTE — Discharge Summary (Signed)
Name: [name] MRN: [id number] DOB: [birth year] 83 y.o. PCP: Kari BaarsEdward Hawkins, MD  Date of Admission: 11/12/2016  9:01 AM Date of Discharge: 11/15/2016 Attending Physician: Earl LagosNischal Narendra, MD  Discharge Diagnosis: 1. Syncope and Collapse 2. Pubic rami fracture 3. Atrial fibrillation on chronic anticoagulation 4. Sick Sinus syndrome with pacemaker placement 5. Scalp laceration 6. Acute blood loss anemia  Principal Problem:   Syncope and collapse Active Problems:   Atrial fibrillation (HCC)   PACEMAKER, PERMANENT   Sinoatrial node dysfunction (HCC)   Fall   Acute blood loss anemia   Discharge Medications: Allergies as of 11/15/2016   No Known Allergies     Medication List    TAKE these medications   albuterol 108 (90 Base) MCG/ACT inhaler Commonly known as:  PROVENTIL HFA;VENTOLIN HFA Inhale 2 puffs into the lungs every 6 (six) hours as needed for wheezing or shortness of breath.   lisinopril 40 MG tablet Commonly known as:  PRINIVIL,ZESTRIL TAKE 1 TABLET BY MOUTH EVERY DAY   metoprolol tartrate 25 MG tablet Commonly known as:  LOPRESSOR Take 1 tablet (25 mg total) by mouth 2 (two) times daily.   multivitamin tablet Take 1 tablet by mouth daily.   warfarin 4 MG tablet Commonly known as:  COUMADIN Starting 7 days after fall, take 6 mg every day, except Tuesdays when you should take 4 mg. What changed:  See the new instructions.       Disposition and follow-up:   [name] [facility]  [facility] condition.  At the hospital follow up visit please address:  1.  Her scalp bleeding. If area continues to remain without acute bleeding may restart Coumadin starting 11/18/2016. Please also evaluate the patient for any more episodes of dizziness, her strength and also if she is having any pubic pain.   2.  Labs / imaging needed at time of follow-up: INR, CBC.  3.  Pending labs/ test needing follow-up: None.  Follow-up  Appointments:   Hospital Course by problem list: Principal Problem:   Syncope and collapse Active Problems:   Atrial fibrillation (HCC)   PACEMAKER, PERMANENT   Sinoatrial node dysfunction (HCC)   Fall   Acute blood loss anemia   1. Syncope and Collapse [name] is a very pleasant 83 year old female who presented 11/12/2016 for evaluation of a fall. The night prior to admission she reports she walked to the bathroom to urinate and woke-up several hours later on the floor covered in blood. She denied any preceding symptoms including fevers, chills, chest pain, palpitations or LH. She did admit that its not unusual for her to have occasional dizzy spells however hasn't fallen since 2016. She reports feeling unsteady on her feet for "awhile." CT head obtained in ED negative. UA and CXR negative for infectious etiology as a cause of her fall. Patients pacemaker was interrogated which returned without abnormality. ECHO was obtained to evaluate for a valvular etiology of her syncopal episode however was without apparent cause. Carotid US obtained. She was on telemetry during her hospitalization and review was without arrhyhtmia including VTACH/VFIB however she was persistently in atrial fibrillation without RVR. She was evaluated by PT/OT who recommend SNF placement for physical therapy and assistance. She was accepted to a bed at SNF and will be d/c there for further tx.  -POSITIVE FOR ORTHOSTATIC HYPOTENSION. Patient will need instructions and guidance on how to manage this condition including compression stockings, standing up  slowly, and if she does get dizzy, to rest for 3-5 minutes before standing up again.   2. Fall resulting in pelvic fracture Plain films of left hip and pelvis revealed a questionable non-displaced fracture of her left inferior pubic ramus. Patient was able to bear weight during hospitalization and denied any pain at all during her stay. The remainder of plain films were  without acute abnormality. PT/OT rec SNF placement.  3. Scalp Laceration with Acute Blood Loss Anemia Posterior right scalp secondary to fall. She had a 2 pt drop in Hb however was > 8 and did not require transfusion during hospitalization. Warfarin was held and adequate hemostasis was achieved shortly after admission.   4. Sick Sinus Syndrome with Pacemaker Her cardiologist was consulted who interrogated the pacemaker as above. Felt unlikely to be related to her cardiac dx.   Discharge Vitals:   BP 129/69 (BP Location: Left Arm)   Pulse 70   Temp 98.7 F (37.1 C) (Oral)   Resp 18   Wt 149 lb 14.4 oz (68 kg)   SpO2 90%   BMI 24.57 kg/m   Pertinent Labs, Studies, and Procedures:  Echocardiogram: LVEF 55-60% with mild LVH. No diastolic dysfunction Troponins: < 0.04 x 3 EKG: without acute changes Telemetry: No VT/VF Carotid US normal +Orthostatic hypotension  Discharge Instructions:  Patient should resume coumadin therapy 1 week after fall, on approximately 11/18/2016 secondary to concern of bleeding from scalp laceration. INR goal 2-3. She was previously on Warfarin 6 mg daily, except on Tuesday when she took 4 mg.  She will need orthostatic hypotension precautions including compression stockings, and slowly rising.   SignedNoemi Chapel, DO 11/15/2016, 1:47 PM   Pager: [phone]

## 2016-11-14 NOTE — Clinical Social Work Placement (Signed)
   CLINICAL SOCIAL WORK PLACEMENT  NOTE  Date:  11/14/2016  Patient Details  Name: [name] MRN: [id number] Date of Birth: May 20, 1923  Clinical Social Work is seeking post-discharge placement for this patient at the Skilled  Nursing Facility level of care (*CSW will initial, date and re-position this form in  chart as items are completed):  Yes   Patient/family provided with [place] Clinical Social Work Department's list of facilities offering this level of care within the geographic area requested by the patient (or if unable, by the patient's family).  Yes   Patient/family informed of their freedom to choose among providers that offer the needed level of care, that participate in Medicare, Medicaid or managed care program needed by the patient, have an available bed and are willing to accept the patient.  Yes   Patient/family informed of [place]'s ownership interest in [facility] and [facility], as well as of the fact that they are under no obligation to receive care at these facilities.  PASRR submitted to EDS on       PASRR number received on       Existing PASRR number confirmed on       FL2 transmitted to all facilities in geographic area requested by pt/family on       FL2 transmitted to all facilities within larger geographic area on       Patient informed that his/her managed care company has contracts with or will negotiate with certain facilities, including the following:            Patient/family informed of bed offers received.  Patient chooses bed at       Physician recommends and patient chooses bed at      Patient to be transferred to   on  .  Patient to be transferred to facility by       Patient family notified on   of transfer.  Name of family member notified:        PHYSICIAN Please sign FL2     Additional Comment:    _______________________________________________ Althea CharonAshley C Zarai Orsborn, LCSW 11/14/2016, 1:43 PM

## 2016-11-14 NOTE — Telephone Encounter (Signed)
Spoke with Darrol AngelBryan, [employer] rep.  Copy of report should be in patient's paper chart.  Bryan to contact Dr. Vincente LibertyMolt with updated info.

## 2016-11-14 NOTE — Evaluation (Signed)
Physical Therapy Evaluation Patient Details Name: [name] MRN: [id number] DOB: 10-02-1923 Today's Date: 11/14/2016   History of Present Illness   Patient is a 83 yo F with pmhx of HTN, HLD, atrial fibrillation (on warfarin), endometrial carcinoma (s/p hysterectomy), and recurrent falls (hx of prior hip fracture) who presents after a fall from home. CT head wo contrast was negative for intracranial bleed and no acute intracranial abnormalities. Plain films of her left hip and pelvis revealed a questionable nondisplaced fracture of her left inferior pubic ramus. Plain films of her left knee without acute abnormality, notable for severe osteoarthritis  Clinical Impression  Pt admitted with above diagnosis. Pt currently with functional limitations due to the deficits listed below (see PT Problem List). Pt performed sit to stand and SPT multiple times for use of toilet and was exhausted after to the point that she did not feel she could ambulate. Min A needed for safety. At this point, do not feel that she would be safe at home alone and agree with OT rec of SNF for rehab. Pt hesitantly agreeable and would like [facility].  Pt will benefit from skilled PT to increase their independence and safety with mobility to allow discharge to the venue listed below.       Follow Up Recommendations SNF;Supervision for mobility/OOB    Equipment Recommendations  None recommended by PT    Recommendations for Other Services       Precautions / Restrictions Precautions Precautions: Fall Precaution Comments: Last time she fell was Christmas 2016 Restrictions Weight Bearing Restrictions: No      Mobility  Bed Mobility Overal bed mobility: Needs Assistance Bed Mobility: Supine to Sit Rolling: Supervision         General bed mobility comments: supervision with increased time and use of rail. Pt able to situate self at EOB  Transfers Overall transfer level: Needs assistance Equipment used:  Rolling walker (2 wheeled) Transfers: Sit to/from [employer];Stand Pivot Transfers Sit to Stand: Min assist Stand pivot transfers: Min assist       General transfer comment: [facility] to recliner. Min A for each transfer and pt maintained flexed posture. Was exhausted after this activity and did not feel she could safely ambulate  Ambulation/Gait                Stairs            Wheelchair Mobility    Modified Rankin (Stroke Patients Only)       Balance Overall balance assessment: Needs assistance Sitting-balance support: Feet supported;No upper extremity supported Sitting balance-Leahy Scale: Good     Standing balance support: Single extremity supported;During functional activity Standing balance-Leahy Scale: Poor Standing balance comment: pt needed UE support on one side to maintain standing for self cleaning after BM. She had to sit and stand several times and then needed PT to assist her with cleaning because became too fatigued                             Pertinent Vitals/Pain Pain Assessment: No/denies pain    Home Living Family/patient expects to be discharged to:: Skilled nursing facility Living Arrangements: Alone Available Help at Discharge: Family;Available PRN/intermittently Type of Home: House Home Access: Stairs to enter Entrance Stairs-Rails: Right Entrance Stairs-Number of Steps: 3 Home Layout: One level Home Equipment: Emergency planning/management officerhower seat;Walker - 2 wheels;Bedside commode      Prior Function Level  of Independence: Independent with assistive device(s)         Comments: pt ambulates with RW. Stopped driving recently. Stepdaughter supportive and does shopping for her as well as taking her out to meals frequently.      Hand Dominance   Dominant Hand: Right    Extremity/Trunk Assessment   Upper Extremity Assessment Upper Extremity Assessment: Generalized weakness    Lower Extremity Assessment Lower  Extremity Assessment: Generalized weakness    Cervical / Trunk Assessment Cervical / Trunk Assessment: Kyphotic  Communication   Communication: No difficulties  Cognition Arousal/Alertness: Awake/alert Behavior During Therapy: WFL for tasks assessed/performed Overall Cognitive Status: Within Functional Limits for tasks assessed                 General Comments: pt amnesic to event    General Comments General comments (skin integrity, edema, etc.): O2 sats and HR stable throughout    Exercises     Assessment/Plan    PT Assessment Patient needs continued PT services  PT Problem List Decreased strength;Decreased activity tolerance;Decreased balance;Decreased mobility;Decreased knowledge of precautions;Cardiopulmonary status limiting activity          PT Treatment Interventions DME instruction;Gait training;Functional mobility training;Therapeutic activities;Therapeutic exercise;Balance training;Patient/family education    PT Goals (Current goals can be found in the Care Plan section)  Acute Rehab PT Goals Patient Stated Goal: really wants to go home, but realizes she cannot the way she is right now PT Goal Formulation: With patient Time For Goal Achievement: 11/28/16 Potential to Achieve Goals: Good    Frequency Min 3X/week   Barriers to discharge Decreased caregiver support lives alone    Co-evaluation               End of Session   Activity Tolerance: Patient limited by fatigue Patient left: in chair;with call bell/phone within reach Nurse Communication: Mobility status    Functional Assessment Tool Used: clinical judgement Functional Limitation: Mobility: Walking and moving around Mobility: Walking and Moving Around Current Status (Z6109): At least 20 percent but less than 40 percent impaired, limited or restricted Mobility: Walking and Moving Around Goal Status [phone]): At least 1 percent but less than 20 percent impaired, limited or restricted     Time: 1103-1128 PT Time Calculation (min) (ACUTE ONLY): 25 min   Charges:   PT Evaluation $PT Eval Moderate Complexity: 1 Procedure PT Treatments $Therapeutic Activity: 8-22 mins   PT G Codes:   PT G-Codes **NOT FOR INPATIENT CLASS** Functional Assessment Tool Used: clinical judgement Functional Limitation: Mobility: Walking and moving around Mobility: Walking and Moving Around Current Status (U9811): At least 20 percent but less than 40 percent impaired, limited or restricted Mobility: Walking and Moving Around Goal Status [phone]): At least 1 percent but less than 20 percent impaired, limited or restricted   [place], PT  Acute Rehab Services  [phone]  Lawana Chambers Avalene Sealy 11/14/2016, 12:02 PM

## 2016-11-14 NOTE — Progress Notes (Signed)
   Subjective: [name] was seen and evaluated today at bedside. She reports no acute events overnight. Reports she is in no pain. Patient reports that she believes that none of her family members will come stay with her however reports that they live just across the street and checks on her frequently.  Objective:  Vital signs in last 24 hours: Vitals:   11/13/16 0441 11/13/16 1559 11/13/16 2204 11/14/16 0435  BP: 121/63 (!) 142/60 (!) 157/100 (!) 149/50  Pulse: 85 62 81 68  Resp: 18 18 18 18   Temp: 98.2 F (36.8 C)  97.8 F (36.6 C) 97.9 F (36.6 C)  TempSrc: Oral  Oral Oral  SpO2: 96% 96% 95% 96%  Weight: 150 lb (68 kg)   142 lb 14.4 oz (64.8 kg)   General: Elderly caucasian woman, resting comfortably in bed. In no acute distress.   HENT: EOMI. No conjunctival injection or icterus. Cardiovascular: Irregularly irregular. No murmur or rub appreciated. Pulmonary: CTA BL. Unlabored breathing.  Abdomen: Soft, non-tender and non-distended. No guarding or rigidity. +bowel sounds.  Extremities: No peripheral edema noted BL. No gross deformities. Skin: Warm, dry. No cyanosis. Psych: Mood normal and affect was mood congruent. Responds to questions appropriately.   Assessment/Plan:  Principal Problem:   Syncope and collapse Active Problems:   Atrial fibrillation (HCC)   PACEMAKER, PERMANENT   Sinoatrial node dysfunction (HCC)   Fall   Acute blood loss anemia  Syncope and collapse She denies any more syncopal episodes since admission and notes she was able to stand yesterday with the assistance of occupational therapy. Reports that she occasionally has dizziness for the past several years and is not uncommon for her to fall. This is new information to me today.  Her pacemaker was interrogated yesterday and was reportedly functioning within accepted parameters. No documentation of this is presently in the chart and have contacted her cardiologist regarding this. Echocardiogram was  negative for a valvular explanation of her symptoms. We'll obtain carotid ultrasound bilaterally to evaluate for stenosis of the source of her syncope and dizziness.  Skilled nursing facility placement has been advised however patient continues to refuse this. 24-hour care was also suggested although patient feels that her family will not come stay with her in order to provide this however reports that they are frequently visiting her. Social work will talk to patient and her family today to determine dispo status. -Follow-up results of carotid ultrasound -Follow-up social work meeting with family -PT evaluated pt today who rec SNF w/ supervision for mobility/OOB  Acute blood loss anemia Baseline appears to be around 11 however was 9.1 yesterday. CBC for today has not yet been drawn however we'll follow this up. Likely source is scalp laceration which is currently not bleeding. She denies any symptoms of increased fatigue or shortness of breath. -Follow-up daily CBC  Atrial fibrillation on chronic anticoagulation Patient on warfarin at home which is presently being held in the setting of recent scalp laceration was significant bleeding. But he has been controlled at this time. INR for today pending -Continue to hold warfarin -Follow-up PT/INR  Dispo: Anticipated discharge to SNF in approximately 1 day(s).   [name] Spilman, DO 11/14/2016, 8:43 AM Pager: [phone]

## 2016-11-15 ENCOUNTER — Inpatient Hospital Stay
Admission: RE | Admit: 2016-11-15 | Discharge: 2016-12-10 | Disposition: A | Discharge status: Home / Self Care | Payer: Medicare Other | Source: Ambulatory Visit | Attending: Internal Medicine | Admitting: Internal Medicine

## 2016-11-15 DIAGNOSIS — M1990 Unspecified osteoarthritis, unspecified site: Secondary | ICD-10-CM | POA: Diagnosis not present

## 2016-11-15 DIAGNOSIS — Z7901 Long term (current) use of anticoagulants: Secondary | ICD-10-CM | POA: Diagnosis not present

## 2016-11-15 DIAGNOSIS — D509 Iron deficiency anemia, unspecified: Secondary | ICD-10-CM | POA: Diagnosis not present

## 2016-11-15 DIAGNOSIS — R262 Difficulty in walking, not elsewhere classified: Secondary | ICD-10-CM | POA: Diagnosis not present

## 2016-11-15 DIAGNOSIS — I482 Chronic atrial fibrillation: Secondary | ICD-10-CM

## 2016-11-15 DIAGNOSIS — S0101XA Laceration without foreign body of scalp, initial encounter: Secondary | ICD-10-CM | POA: Diagnosis not present

## 2016-11-15 DIAGNOSIS — R531 Weakness: Secondary | ICD-10-CM | POA: Diagnosis not present

## 2016-11-15 DIAGNOSIS — S0101XD Laceration without foreign body of scalp, subsequent encounter: Secondary | ICD-10-CM | POA: Diagnosis not present

## 2016-11-15 DIAGNOSIS — I4891 Unspecified atrial fibrillation: Secondary | ICD-10-CM | POA: Diagnosis not present

## 2016-11-15 DIAGNOSIS — E785 Hyperlipidemia, unspecified: Secondary | ICD-10-CM | POA: Diagnosis not present

## 2016-11-15 DIAGNOSIS — W19XXXA Unspecified fall, initial encounter: Secondary | ICD-10-CM | POA: Diagnosis not present

## 2016-11-15 DIAGNOSIS — Z9181 History of falling: Secondary | ICD-10-CM | POA: Diagnosis not present

## 2016-11-15 DIAGNOSIS — Z950 Presence of cardiac pacemaker: Secondary | ICD-10-CM | POA: Diagnosis not present

## 2016-11-15 DIAGNOSIS — R0989 Other specified symptoms and signs involving the circulatory and respiratory systems: Secondary | ICD-10-CM | POA: Diagnosis not present

## 2016-11-15 DIAGNOSIS — R55 Syncope and collapse: Secondary | ICD-10-CM | POA: Diagnosis not present

## 2016-11-15 DIAGNOSIS — R296 Repeated falls: Secondary | ICD-10-CM | POA: Diagnosis not present

## 2016-11-15 DIAGNOSIS — I10 Essential (primary) hypertension: Secondary | ICD-10-CM | POA: Diagnosis not present

## 2016-11-15 DIAGNOSIS — S0003XA Contusion of scalp, initial encounter: Secondary | ICD-10-CM

## 2016-11-15 DIAGNOSIS — R062 Wheezing: Secondary | ICD-10-CM

## 2016-11-15 DIAGNOSIS — Z823 Family history of stroke: Secondary | ICD-10-CM | POA: Diagnosis not present

## 2016-11-15 DIAGNOSIS — I951 Orthostatic hypotension: Secondary | ICD-10-CM | POA: Diagnosis not present

## 2016-11-15 DIAGNOSIS — R3129 Other microscopic hematuria: Secondary | ICD-10-CM | POA: Diagnosis not present

## 2016-11-15 DIAGNOSIS — D62 Acute posthemorrhagic anemia: Secondary | ICD-10-CM | POA: Diagnosis not present

## 2016-11-15 DIAGNOSIS — S32592A Other specified fracture of left pubis, initial encounter for closed fracture: Secondary | ICD-10-CM | POA: Diagnosis not present

## 2016-11-15 DIAGNOSIS — R488 Other symbolic dysfunctions: Secondary | ICD-10-CM | POA: Diagnosis not present

## 2016-11-15 DIAGNOSIS — M6281 Muscle weakness (generalized): Secondary | ICD-10-CM | POA: Diagnosis not present

## 2016-11-15 DIAGNOSIS — R05 Cough: Secondary | ICD-10-CM | POA: Diagnosis not present

## 2016-11-15 DIAGNOSIS — J449 Chronic obstructive pulmonary disease, unspecified: Secondary | ICD-10-CM | POA: Diagnosis not present

## 2016-11-15 DIAGNOSIS — Z8042 Family history of malignant neoplasm of prostate: Secondary | ICD-10-CM | POA: Diagnosis not present

## 2016-11-15 LAB — GLUCOSE, CAPILLARY
GLUCOSE-CAPILLARY: 109 mg/dL — AB (ref 65–99)
Glucose-Capillary: 19 mg/dL — CL (ref 65–99)
Glucose-Capillary: 116 mg/dL — ABNORMAL HIGH (ref 65–99)
Glucose-Capillary: 118 mg/dL — ABNORMAL HIGH (ref 65–99)

## 2016-11-15 LAB — BASIC METABOLIC PANEL
ANION GAP: 6 (ref 5–15)
BUN: 12 mg/dL (ref 6–20)
CALCIUM: 7.9 mg/dL — AB (ref 8.9–10.3)
CO2: 27 mmol/L (ref 22–32)
Chloride: 99 mmol/L — ABNORMAL LOW (ref 101–111)
Creatinine, Ser: 0.59 mg/dL (ref 0.44–1.00)
GFR calc non Af Amer: >60 mL/min (ref >60)
Glucose, Bld: 103 mg/dL — ABNORMAL HIGH (ref 65–99)
POTASSIUM: 3.6 mmol/L (ref 3.5–5.1)
Sodium: 132 mmol/L — ABNORMAL LOW (ref 135–145)

## 2016-11-15 LAB — CBC
HEMATOCRIT: 29.3 % — AB (ref 36.0–46.0)
Hemoglobin: 9.5 g/dL — ABNORMAL LOW (ref 12.0–15.0)
MCH: 31.8 pg (ref 26.0–34.0)
MCHC: 32.4 g/dL (ref 30.0–36.0)
MCV: 98 fL (ref 78.0–100.0)
Platelets: 178 10*3/uL (ref 150–400)
RBC: 2.99 MIL/uL — AB (ref 3.87–5.11)
RDW: 14.2 % (ref 11.5–15.5)
WBC: 4.7 10*3/uL (ref 4.0–10.5)

## 2016-11-15 LAB — VAS US CAROTID
LEFT ECA DIAS: 8 cm/s
LEFT VERTEBRAL DIAS: -13 cm/s
LICAPDIAS: 11 cm/s
Left CCA dist dias: 11 cm/s
Left CCA dist sys: 67 cm/s
Left CCA prox dias: 15 cm/s
Left CCA prox sys: 102 cm/s
Left ICA dist dias: -28 cm/s
Left ICA dist sys: -173 cm/s
Left ICA prox sys: 62 cm/s
RCCAPDIAS: 10 cm/s
RIGHT ECA DIAS: -9 cm/s
RIGHT VERTEBRAL DIAS: -11 cm/s
Right CCA prox sys: 75 cm/s
Right cca dist sys: -40 cm/s

## 2016-11-15 LAB — PROTIME-INR
INR: 1.18
Prothrombin Time: 15 seconds (ref 11.4–15.2)

## 2016-11-15 MED ORDER — IPRATROPIUM-ALBUTEROL 0.5-2.5 (3) MG/3ML IN SOLN: 3.0000 mL | Freq: Once | RESPIRATORY_TRACT | Status: DC

## 2016-11-15 MED ORDER — ALBUTEROL SULFATE HFA 108 (90 BASE) MCG/ACT IN AERS: 2.0000 | INHALATION_SPRAY | Freq: Four times a day (QID) | RESPIRATORY_TRACT | 0 refills | Status: DC | PRN

## 2016-11-15 NOTE — Progress Notes (Addendum)
Pt given discharge instructions, medication lists, follow up appointments, and when to call the doctor.  Pt verbalizes understanding. Called report to [facility] and all questions answered. Patient awaiting transport via [facility]. Pt resting with call bell within reach.  Will continue to monitor.  Thomas HoffBurton, Tyronn Golda McClintock, RN

## 2016-11-15 NOTE — Progress Notes (Signed)
  Date: 11/15/2016  Patient name: [name]  Medical record number: [id number]  Date of birth: 06/25/1923   I have personally seen and evaluated this patient and the plan of care was discussed with the house staff. Please see Dr. Lana FishMolt's note for complete details. I concur with her findings.   [name] was also noted to have intermittent wheezing when we saw her this morning.  She reported no cough or SOB, no dyspnea.  She felt in her normal state of health.  I asked the resident team to order her BID PRN duonebs on discharge.    Inez CatalinaEmily B Mullen, MD 11/15/2016, 5:21 PM

## 2016-11-15 NOTE — Progress Notes (Signed)
   Subjective: [name] was seen and evaluated today at bedside. She denies any complaints. Denies any pain and reports her breathing is at baseline. Reports she is just awaiting transport to SNF.   Objective:  Vital signs in last 24 hours: Vitals:   11/14/16 2017 11/14/16 2148 11/14/16 2149 11/15/16 0511  BP: (!) 136/59  134/68 125/61  Pulse: 90  90 69  Resp: 18   18  Temp: 98.5 F (36.9 C)   98.6 F (37 C)  TempSrc: Oral   Oral  SpO2: 94% 95%  94%  Weight:    149 lb 14.4 oz (68 kg)   General: Very pleasant elderly woman resting in bed comfortably. In no acute distress. In good spirits. HENT: EOMI. No conjunctival injection, icterus. Cardiovascular: IR, IR. No murmur or rub appreciated. Pulmonary: Audible wheezing appreciated. Auscultation revealed diffuse BL wheezing. Nonlabored breathing. Abdomen: Soft, non-tender and non-distended. +BS Skin: Warm, dry. No cyanosis. Scalp lac well healed. Hematoma present right scalp. Dried blood present.  Psych: Mood normal and affect was mood congruent. Responds to questions appropriately.   Assessment/Plan:  Principal Problem:   Syncope and collapse Active Problems:   Atrial fibrillation (HCC)   PACEMAKER, PERMANENT   Sinoatrial node dysfunction (HCC)   Fall   Acute blood loss anemia  Syncope and Collapse, Fall - Questionable Pubic Rami Fracture Continues to be without syncopal episodes during admission. Carotid US normal. ECHO negative for valvular cause. Pacemaker interrogated and is functioning wnl. Orthostatic BPs reveal some orthostasis however pt was asymptomatic during the examination. Have suggested that the patient wear compression stockings, use caution when standing (stand up slowly) and encouraged patient to sit for 3-5 mins when she feels dizzy. [facility]. -D/c to SNF for continued rehabilitation   Acute Blood Loss Anemia, Atrial Fibrillation Stable at 9.5. Hemostasis  achieved. Will continue to hold warfarin for another several days to ensure no more blood loss from area. She had a 2 pt drop in Hb and would like to avoid this complication. Instructions have been given to SNF regarding this.   Dispo: Anticipated discharge to SNF today.  [name] Sanger, DO 11/15/2016, 1:10 PM Pager: [phone]

## 2016-11-15 NOTE — Progress Notes (Signed)
   11/13/16 1430  OT G-codes **NOT FOR INPATIENT CLASS**  Functional Assessment Tool Used (clinical obervation and chart review)  Functional Limitation Self care  Self Care Current Status (Z6109(G8987) CL  Self Care Goal Status (U0454(G8988) CJ   Late entry Ignacia PalmaCathy Shaquaya Wuellner, OTR/L [phone] 11/15/2016

## 2016-11-15 NOTE — Progress Notes (Signed)
Clinical Social Worker facilitated patient discharge including contacting patient family and facility to confirm patient discharge plans.  Clinical information faxed to facility and family agreeable with plan.  CSW arranged ambulance [facility] .  RN Dois DavenportSandra to call [phone] for report prior to discharge.  Clinical Social Worker will sign off for now as social work intervention is no longer needed. Please consult us again if new need arises.  [name] MoodAshley Daelyn [name], MSW, [employer]WA [phone]

## 2016-11-19 ENCOUNTER — Telehealth: Payer: Self-pay | Admitting: *Deleted

## 2016-11-19 ENCOUNTER — Ambulatory Visit (INDEPENDENT_AMBULATORY_CARE_PROVIDER_SITE_OTHER): Payer: Medicare Other | Admitting: *Deleted

## 2016-11-19 ENCOUNTER — Other Ambulatory Visit (HOSPITAL_COMMUNITY)
Admission: RE | Admit: 2016-11-19 | Discharge: 2016-11-19 | Disposition: A | Discharge status: Home / Self Care | Payer: Medicare Other | Source: Skilled Nursing Facility | Attending: Pulmonary Disease | Admitting: Pulmonary Disease

## 2016-11-19 DIAGNOSIS — I482 Chronic atrial fibrillation: Secondary | ICD-10-CM | POA: Insufficient documentation

## 2016-11-19 DIAGNOSIS — J449 Chronic obstructive pulmonary disease, unspecified: Secondary | ICD-10-CM | POA: Insufficient documentation

## 2016-11-19 DIAGNOSIS — E785 Hyperlipidemia, unspecified: Secondary | ICD-10-CM | POA: Insufficient documentation

## 2016-11-19 DIAGNOSIS — R55 Syncope and collapse: Secondary | ICD-10-CM | POA: Insufficient documentation

## 2016-11-19 DIAGNOSIS — D509 Iron deficiency anemia, unspecified: Secondary | ICD-10-CM | POA: Insufficient documentation

## 2016-11-19 DIAGNOSIS — M1990 Unspecified osteoarthritis, unspecified site: Secondary | ICD-10-CM | POA: Insufficient documentation

## 2016-11-19 DIAGNOSIS — Z5181 Encounter for therapeutic drug level monitoring: Secondary | ICD-10-CM

## 2016-11-19 DIAGNOSIS — I4891 Unspecified atrial fibrillation: Secondary | ICD-10-CM

## 2016-11-19 LAB — PROTIME-INR
INR: 1.13
Prothrombin Time: 14.6 seconds (ref 11.4–15.2)

## 2016-11-19 NOTE — Telephone Encounter (Signed)
PT 14.6 INR 1.3 [facility] give [facility], nurse Erie NoeVanessa a call @ [phone]

## 2016-11-19 NOTE — Telephone Encounter (Signed)
Done.  See coumadin note. 

## 2016-11-21 ENCOUNTER — Encounter (HOSPITAL_COMMUNITY)
Admission: RE | Admit: 2016-11-21 | Discharge: 2016-11-21 | Disposition: A | Discharge status: Home / Self Care | Payer: Medicare Other | Source: Skilled Nursing Facility | Attending: Pulmonary Disease | Admitting: Pulmonary Disease

## 2016-11-21 ENCOUNTER — Ambulatory Visit (INDEPENDENT_AMBULATORY_CARE_PROVIDER_SITE_OTHER): Payer: Medicare Other | Admitting: *Deleted

## 2016-11-21 DIAGNOSIS — I482 Chronic atrial fibrillation: Secondary | ICD-10-CM | POA: Insufficient documentation

## 2016-11-21 DIAGNOSIS — D509 Iron deficiency anemia, unspecified: Secondary | ICD-10-CM | POA: Insufficient documentation

## 2016-11-21 DIAGNOSIS — R55 Syncope and collapse: Secondary | ICD-10-CM | POA: Insufficient documentation

## 2016-11-21 DIAGNOSIS — Z5181 Encounter for therapeutic drug level monitoring: Secondary | ICD-10-CM

## 2016-11-21 DIAGNOSIS — J449 Chronic obstructive pulmonary disease, unspecified: Secondary | ICD-10-CM | POA: Insufficient documentation

## 2016-11-21 DIAGNOSIS — E785 Hyperlipidemia, unspecified: Secondary | ICD-10-CM | POA: Insufficient documentation

## 2016-11-21 DIAGNOSIS — M1990 Unspecified osteoarthritis, unspecified site: Secondary | ICD-10-CM | POA: Insufficient documentation

## 2016-11-21 LAB — PROTIME-INR
INR: 1.32
Prothrombin Time: 16.5 seconds — ABNORMAL HIGH (ref 11.4–15.2)

## 2016-11-21 LAB — POCT INR: INR: 1.32

## 2016-11-25 DIAGNOSIS — I4891 Unspecified atrial fibrillation: Secondary | ICD-10-CM | POA: Diagnosis not present

## 2016-11-25 DIAGNOSIS — J449 Chronic obstructive pulmonary disease, unspecified: Secondary | ICD-10-CM | POA: Diagnosis not present

## 2016-11-25 DIAGNOSIS — R531 Weakness: Secondary | ICD-10-CM | POA: Diagnosis not present

## 2016-11-26 ENCOUNTER — Encounter (HOSPITAL_COMMUNITY)
Admission: RE | Admit: 2016-11-26 | Discharge: 2016-11-26 | Disposition: A | Discharge status: Home / Self Care | Payer: Medicare Other | Source: Skilled Nursing Facility | Attending: Pulmonary Disease | Admitting: Pulmonary Disease

## 2016-11-26 ENCOUNTER — Ambulatory Visit (INDEPENDENT_AMBULATORY_CARE_PROVIDER_SITE_OTHER): Payer: Medicare Other | Admitting: *Deleted

## 2016-11-26 DIAGNOSIS — Z5181 Encounter for therapeutic drug level monitoring: Secondary | ICD-10-CM

## 2016-11-26 DIAGNOSIS — I4891 Unspecified atrial fibrillation: Secondary | ICD-10-CM

## 2016-11-26 LAB — PROTIME-INR
INR: 2.23
Prothrombin Time: 25.1 seconds — ABNORMAL HIGH (ref 11.4–15.2)

## 2016-11-26 LAB — POCT INR: INR: 2.23

## 2016-11-26 NOTE — H&P (Signed)
[name] H Goodpasture MRN: [id number] DOB/AGE: 1923/03/27 83 y.o. Primary Care Physician:Quaneshia Wareing L, MD Admit date: 11/15/2016 Chief Complaint: For rehabilitation HPI: This is documentation of my history and physical performed on the 14th of this month at the skilled care facility. She had a fall at home with a questionable nondisplaced pelvic fracture. She was noted at that time to be deconditioned and felt to need rehabilitation and she has come to the skilled care facility for that. She says she feels much better. She is interested in going back home. She has no other new complaints. She has chronic atrial fib. She has COPD. She has chronic weakness of her legs and uses a walker at home. No nausea vomiting diarrhea. No chest pain. No rapid atrial fib. No dizziness now. No urinary symptoms.  Past Medical History:  Diagnosis Date  . Arthritis   . Atrial fibrillation (HCC)   . COPD (chronic obstructive pulmonary disease) (HCC)   . Hip pain, left   . Hypertension   . Sinoatrial node dysfunction (HCC)    Past Surgical History:  Procedure Laterality Date  . BACK SURGERY    . EYE SURGERY Bilateral    cataract removal  . hip arthoplasty     total  . KNEE ARTHROPLASTY    . uterine polyp removal          Family History  Problem Relation Age of Onset  . Prostate cancer Father   . Heart failure Father   . Heart disease Father   . Stroke Sister   . Depression Maternal Grandmother     Social History:  reports that she has never smoked. She has never used smokeless tobacco. She reports that she does not drink alcohol or use drugs.   Allergies: No Known Allergies  Medications Prior to Admission  Medication Sig Dispense Refill  . albuterol (PROVENTIL HFA;VENTOLIN HFA) 108 (90 Base) MCG/ACT inhaler Inhale 2 puffs into the lungs every 6 (six) hours as needed for wheezing or shortness of breath. 1 Inhaler 0  . lisinopril (PRINIVIL,ZESTRIL) 40 MG tablet TAKE 1 TABLET BY MOUTH EVERY DAY 90  tablet 3  . metoprolol tartrate (LOPRESSOR) 25 MG tablet Take 1 tablet (25 mg total) by mouth 2 (two) times daily. 180 tablet 3  . Multiple Vitamin (MULTIVITAMIN) tablet Take 1 tablet by mouth daily.    Marland Kitchen. warfarin (COUMADIN) 4 MG tablet Starting 7 days after fall, take 6 mg every day, except Tuesdays when you should take 4 mg. 135 tablet 3       WUJ:WJXBJROS:apart from the symptoms mentioned above,there are no other symptoms referable to all systems reviewed.  Physical Exam: There were no vitals taken for this visit. Constitutional: She is awake and alert and in no acute distress. Eyes: Pupils react EOMI. Ears nose mouth and throat: Her mucous membranes are moist. She is hard of hearing. Cardiovascular: She is in atrial fib with ventricular response in the 80s. No edema. Respiratory: Her respiratory effort is normal. Her lungs are clear. Gastrointestinal: Her abdomen is soft without masses. Musculoskeletal: She is using a walker. She is weak in her legs. Skin: Warm and dry. Neurological: No focal abnormalities. Psychiatric: She is anxious about going home   No results for input(s): WBC, NEUTROABS, HGB, HCT, MCV, PLT in the last 72 hours. No results for input(s): NA, K, CL, CO2, GLUCOSE, BUN, CREATININE, CALCIUM, MG in the last 72 hours.  Invalid input(s): PHOlablast2(ast:2,ALT:2,alkphos:2,bilitot:2,prot:2,albumin:2)@    No results found for this or any previous  visit (from the past 240 hour(s)).   Ct Head Wo Contrast  Result Date: 11/12/2016 CLINICAL DATA:  Fall last night, hit back of head. EXAM: CT HEAD WITHOUT CONTRAST TECHNIQUE: Contiguous axial images were obtained from the base of the skull through the vertex without intravenous contrast. COMPARISON:  05/05/2011 FINDINGS: Brain: Old right parietal infarct, stable since prior study. There is atrophy and chronic small vessel disease changes. No acute intracranial abnormality. Specifically, no hemorrhage, hydrocephalus, mass lesion, acute  infarction, or significant intracranial injury. Vascular: No hyperdense vessel or unexpected calcification. Skull: No acute calvarial abnormality. Sinuses/Orbits: Mild mucosal thickening throughout the paranasal sinuses. No air-fluid levels. Mastoid air cells are clear. Orbital soft tissues unremarkable. Other: Soft tissue swelling noted in the posterior right parietal region. IMPRESSION: No acute intracranial abnormality. Atrophy, chronic microvascular disease. Old right parietal infarct, stable. Electronically Signed   By: Charlett Nose M.D.   On: 11/12/2016 10:38   Dg Knee Complete 4 Views Left  Result Date: 11/12/2016 CLINICAL DATA:  Left knee pain secondary to a fall walking to the bathroom this morning. EXAM: LEFT KNEE - COMPLETE 4+ VIEW COMPARISON:  None. FINDINGS: There is no fracture or dislocation or joint effusion. The patient has severe tricompartmental osteoarthritis of the knee. Arterial vascular calcifications around the knee. IMPRESSION: No acute abnormality.  Severe osteoarthritis. Electronically Signed   By: Francene Boyers M.D.   On: 11/12/2016 10:53   Dg Chest Port 1 View  Result Date: 11/12/2016 CLINICAL DATA:  Patient with scalp laceration status post fall. EXAM: PORTABLE CHEST 1 VIEW COMPARISON:  Chest radiograph 09/17/2011. FINDINGS: Multiple monitoring leads overlie the patient. Multi lead pacer apparatus overlies the left hemithorax, leads are stable in position. Stable cardiomegaly with tortuosity of the thoracic aorta. Low lung volumes. Bibasilar heterogeneous pulmonary opacities. No pleural effusion or pneumothorax. IMPRESSION: Cardiomegaly. Low lung volumes with bibasilar atelectasis. Electronically Signed   By: Annia Belt M.D.   On: 11/12/2016 16:29   Dg Hip Unilat W Or Wo Pelvis 2-3 Views Left  Result Date: 11/12/2016 CLINICAL DATA:  Fall EXAM: DG HIP (WITH OR WITHOUT PELVIS) 2-3V LEFT COMPARISON:  None. FINDINGS: Prior right hip replacement. Question nondisplaced fracture  through the medial left inferior pubic ramus, only seen on one view. No femoral fracture. Diffuse vascular calcifications. IMPRESSION: Questionable nondisplaced left inferior pubic ramus fracture. Electronically Signed   By: Charlett Nose M.D.   On: 11/12/2016 10:52   Impression: She has weakness and deconditioning. She is undergoing PT for that. She has chronic atrial fib. She has probable nondisplaced inferior pubic ramus fracture but she's not having symptoms from that now. She has COPD at baseline Active Problems:   * No active hospital problems. *     Plan: Continue rehabilitation      Sydna Brodowski L   11/26/2016, 9:01 AM

## 2016-12-03 ENCOUNTER — Ambulatory Visit (INDEPENDENT_AMBULATORY_CARE_PROVIDER_SITE_OTHER): Payer: Medicare Other | Admitting: *Deleted

## 2016-12-03 ENCOUNTER — Encounter (HOSPITAL_COMMUNITY)
Admission: AD | Admit: 2016-12-03 | Discharge: 2016-12-03 | Disposition: A | Discharge status: Home / Self Care | Payer: Medicare Other | Source: Skilled Nursing Facility | Attending: Pulmonary Disease | Admitting: Pulmonary Disease

## 2016-12-03 DIAGNOSIS — M6281 Muscle weakness (generalized): Secondary | ICD-10-CM | POA: Insufficient documentation

## 2016-12-03 DIAGNOSIS — J449 Chronic obstructive pulmonary disease, unspecified: Secondary | ICD-10-CM | POA: Insufficient documentation

## 2016-12-03 DIAGNOSIS — I482 Chronic atrial fibrillation: Secondary | ICD-10-CM | POA: Insufficient documentation

## 2016-12-03 DIAGNOSIS — R262 Difficulty in walking, not elsewhere classified: Secondary | ICD-10-CM | POA: Insufficient documentation

## 2016-12-03 DIAGNOSIS — M1990 Unspecified osteoarthritis, unspecified site: Secondary | ICD-10-CM | POA: Insufficient documentation

## 2016-12-03 DIAGNOSIS — Z5181 Encounter for therapeutic drug level monitoring: Secondary | ICD-10-CM

## 2016-12-03 DIAGNOSIS — R55 Syncope and collapse: Secondary | ICD-10-CM | POA: Insufficient documentation

## 2016-12-03 DIAGNOSIS — I4891 Unspecified atrial fibrillation: Secondary | ICD-10-CM

## 2016-12-03 LAB — PROTIME-INR
INR: 2.33
PROTHROMBIN TIME: 26 s — AB (ref 11.4–15.2)

## 2016-12-10 ENCOUNTER — Ambulatory Visit (INDEPENDENT_AMBULATORY_CARE_PROVIDER_SITE_OTHER): Payer: Medicare Other | Admitting: *Deleted

## 2016-12-10 ENCOUNTER — Telehealth: Payer: Self-pay | Admitting: *Deleted

## 2016-12-10 ENCOUNTER — Encounter (HOSPITAL_COMMUNITY)
Admission: RE | Admit: 2016-12-10 | Discharge: 2016-12-10 | Disposition: A | Discharge status: Home / Self Care | Payer: Medicare Other | Source: Skilled Nursing Facility | Attending: *Deleted | Admitting: *Deleted

## 2016-12-10 DIAGNOSIS — Z5181 Encounter for therapeutic drug level monitoring: Secondary | ICD-10-CM

## 2016-12-10 DIAGNOSIS — I4891 Unspecified atrial fibrillation: Secondary | ICD-10-CM

## 2016-12-10 LAB — PROTIME-INR
INR: 2.98
PROTHROMBIN TIME: 31.7 s — AB (ref 11.4–15.2)

## 2016-12-10 NOTE — Telephone Encounter (Signed)
INR - 2.98 / please call with instructions. Patient is being discharged today. / tg

## 2016-12-10 NOTE — Telephone Encounter (Signed)
Done.  See coumadin note. 

## 2016-12-13 DIAGNOSIS — J449 Chronic obstructive pulmonary disease, unspecified: Secondary | ICD-10-CM | POA: Diagnosis not present

## 2016-12-13 DIAGNOSIS — Z96641 Presence of right artificial hip joint: Secondary | ICD-10-CM | POA: Diagnosis not present

## 2016-12-13 DIAGNOSIS — M1991 Primary osteoarthritis, unspecified site: Secondary | ICD-10-CM | POA: Diagnosis not present

## 2016-12-13 DIAGNOSIS — Z9181 History of falling: Secondary | ICD-10-CM | POA: Diagnosis not present

## 2016-12-13 DIAGNOSIS — I10 Essential (primary) hypertension: Secondary | ICD-10-CM | POA: Diagnosis not present

## 2016-12-13 DIAGNOSIS — E785 Hyperlipidemia, unspecified: Secondary | ICD-10-CM | POA: Diagnosis not present

## 2016-12-13 DIAGNOSIS — D509 Iron deficiency anemia, unspecified: Secondary | ICD-10-CM | POA: Diagnosis not present

## 2016-12-13 DIAGNOSIS — Z950 Presence of cardiac pacemaker: Secondary | ICD-10-CM | POA: Diagnosis not present

## 2016-12-13 DIAGNOSIS — Z7901 Long term (current) use of anticoagulants: Secondary | ICD-10-CM | POA: Diagnosis not present

## 2016-12-13 DIAGNOSIS — I4891 Unspecified atrial fibrillation: Secondary | ICD-10-CM | POA: Diagnosis not present

## 2016-12-13 DIAGNOSIS — Z96651 Presence of right artificial knee joint: Secondary | ICD-10-CM | POA: Diagnosis not present

## 2016-12-13 DIAGNOSIS — R55 Syncope and collapse: Secondary | ICD-10-CM | POA: Diagnosis not present

## 2016-12-14 DIAGNOSIS — R55 Syncope and collapse: Secondary | ICD-10-CM | POA: Diagnosis not present

## 2016-12-14 DIAGNOSIS — I10 Essential (primary) hypertension: Secondary | ICD-10-CM | POA: Diagnosis not present

## 2016-12-14 DIAGNOSIS — I4891 Unspecified atrial fibrillation: Secondary | ICD-10-CM | POA: Diagnosis not present

## 2016-12-14 DIAGNOSIS — Z96651 Presence of right artificial knee joint: Secondary | ICD-10-CM | POA: Diagnosis not present

## 2016-12-14 DIAGNOSIS — E785 Hyperlipidemia, unspecified: Secondary | ICD-10-CM | POA: Diagnosis not present

## 2016-12-14 DIAGNOSIS — D509 Iron deficiency anemia, unspecified: Secondary | ICD-10-CM | POA: Diagnosis not present

## 2016-12-14 DIAGNOSIS — J449 Chronic obstructive pulmonary disease, unspecified: Secondary | ICD-10-CM | POA: Diagnosis not present

## 2016-12-14 DIAGNOSIS — Z7901 Long term (current) use of anticoagulants: Secondary | ICD-10-CM | POA: Diagnosis not present

## 2016-12-14 DIAGNOSIS — Z950 Presence of cardiac pacemaker: Secondary | ICD-10-CM | POA: Diagnosis not present

## 2016-12-14 DIAGNOSIS — Z96641 Presence of right artificial hip joint: Secondary | ICD-10-CM | POA: Diagnosis not present

## 2016-12-14 DIAGNOSIS — M1991 Primary osteoarthritis, unspecified site: Secondary | ICD-10-CM | POA: Diagnosis not present

## 2016-12-14 DIAGNOSIS — Z9181 History of falling: Secondary | ICD-10-CM | POA: Diagnosis not present

## 2016-12-17 DIAGNOSIS — M1991 Primary osteoarthritis, unspecified site: Secondary | ICD-10-CM | POA: Diagnosis not present

## 2016-12-17 DIAGNOSIS — I4891 Unspecified atrial fibrillation: Secondary | ICD-10-CM | POA: Diagnosis not present

## 2016-12-17 DIAGNOSIS — Z9181 History of falling: Secondary | ICD-10-CM | POA: Diagnosis not present

## 2016-12-17 DIAGNOSIS — Z96651 Presence of right artificial knee joint: Secondary | ICD-10-CM | POA: Diagnosis not present

## 2016-12-17 DIAGNOSIS — Z950 Presence of cardiac pacemaker: Secondary | ICD-10-CM | POA: Diagnosis not present

## 2016-12-17 DIAGNOSIS — Z96641 Presence of right artificial hip joint: Secondary | ICD-10-CM | POA: Diagnosis not present

## 2016-12-17 DIAGNOSIS — E785 Hyperlipidemia, unspecified: Secondary | ICD-10-CM | POA: Diagnosis not present

## 2016-12-17 DIAGNOSIS — D509 Iron deficiency anemia, unspecified: Secondary | ICD-10-CM | POA: Diagnosis not present

## 2016-12-17 DIAGNOSIS — J449 Chronic obstructive pulmonary disease, unspecified: Secondary | ICD-10-CM | POA: Diagnosis not present

## 2016-12-17 DIAGNOSIS — R55 Syncope and collapse: Secondary | ICD-10-CM | POA: Diagnosis not present

## 2016-12-17 DIAGNOSIS — Z7901 Long term (current) use of anticoagulants: Secondary | ICD-10-CM | POA: Diagnosis not present

## 2016-12-17 DIAGNOSIS — I10 Essential (primary) hypertension: Secondary | ICD-10-CM | POA: Diagnosis not present

## 2016-12-18 DIAGNOSIS — Z96651 Presence of right artificial knee joint: Secondary | ICD-10-CM | POA: Diagnosis not present

## 2016-12-18 DIAGNOSIS — R55 Syncope and collapse: Secondary | ICD-10-CM | POA: Diagnosis not present

## 2016-12-18 DIAGNOSIS — J449 Chronic obstructive pulmonary disease, unspecified: Secondary | ICD-10-CM | POA: Diagnosis not present

## 2016-12-18 DIAGNOSIS — I4891 Unspecified atrial fibrillation: Secondary | ICD-10-CM | POA: Diagnosis not present

## 2016-12-18 DIAGNOSIS — Z7901 Long term (current) use of anticoagulants: Secondary | ICD-10-CM | POA: Diagnosis not present

## 2016-12-18 DIAGNOSIS — I10 Essential (primary) hypertension: Secondary | ICD-10-CM | POA: Diagnosis not present

## 2016-12-18 DIAGNOSIS — Z9181 History of falling: Secondary | ICD-10-CM | POA: Diagnosis not present

## 2016-12-18 DIAGNOSIS — Z96641 Presence of right artificial hip joint: Secondary | ICD-10-CM | POA: Diagnosis not present

## 2016-12-18 DIAGNOSIS — Z950 Presence of cardiac pacemaker: Secondary | ICD-10-CM | POA: Diagnosis not present

## 2016-12-18 DIAGNOSIS — E785 Hyperlipidemia, unspecified: Secondary | ICD-10-CM | POA: Diagnosis not present

## 2016-12-18 DIAGNOSIS — D509 Iron deficiency anemia, unspecified: Secondary | ICD-10-CM | POA: Diagnosis not present

## 2016-12-18 DIAGNOSIS — M1991 Primary osteoarthritis, unspecified site: Secondary | ICD-10-CM | POA: Diagnosis not present

## 2016-12-19 DIAGNOSIS — Z96641 Presence of right artificial hip joint: Secondary | ICD-10-CM | POA: Diagnosis not present

## 2016-12-19 DIAGNOSIS — E785 Hyperlipidemia, unspecified: Secondary | ICD-10-CM | POA: Diagnosis not present

## 2016-12-19 DIAGNOSIS — D509 Iron deficiency anemia, unspecified: Secondary | ICD-10-CM | POA: Diagnosis not present

## 2016-12-19 DIAGNOSIS — J449 Chronic obstructive pulmonary disease, unspecified: Secondary | ICD-10-CM | POA: Diagnosis not present

## 2016-12-19 DIAGNOSIS — M1991 Primary osteoarthritis, unspecified site: Secondary | ICD-10-CM | POA: Diagnosis not present

## 2016-12-19 DIAGNOSIS — R55 Syncope and collapse: Secondary | ICD-10-CM | POA: Diagnosis not present

## 2016-12-19 DIAGNOSIS — Z9181 History of falling: Secondary | ICD-10-CM | POA: Diagnosis not present

## 2016-12-19 DIAGNOSIS — Z7901 Long term (current) use of anticoagulants: Secondary | ICD-10-CM | POA: Diagnosis not present

## 2016-12-19 DIAGNOSIS — Z96651 Presence of right artificial knee joint: Secondary | ICD-10-CM | POA: Diagnosis not present

## 2016-12-19 DIAGNOSIS — I4891 Unspecified atrial fibrillation: Secondary | ICD-10-CM | POA: Diagnosis not present

## 2016-12-19 DIAGNOSIS — I10 Essential (primary) hypertension: Secondary | ICD-10-CM | POA: Diagnosis not present

## 2016-12-19 DIAGNOSIS — Z950 Presence of cardiac pacemaker: Secondary | ICD-10-CM | POA: Diagnosis not present

## 2016-12-21 DIAGNOSIS — Z950 Presence of cardiac pacemaker: Secondary | ICD-10-CM | POA: Diagnosis not present

## 2016-12-21 DIAGNOSIS — I4891 Unspecified atrial fibrillation: Secondary | ICD-10-CM | POA: Diagnosis not present

## 2016-12-21 DIAGNOSIS — E785 Hyperlipidemia, unspecified: Secondary | ICD-10-CM | POA: Diagnosis not present

## 2016-12-21 DIAGNOSIS — Z96641 Presence of right artificial hip joint: Secondary | ICD-10-CM | POA: Diagnosis not present

## 2016-12-21 DIAGNOSIS — J449 Chronic obstructive pulmonary disease, unspecified: Secondary | ICD-10-CM | POA: Diagnosis not present

## 2016-12-21 DIAGNOSIS — D509 Iron deficiency anemia, unspecified: Secondary | ICD-10-CM | POA: Diagnosis not present

## 2016-12-21 DIAGNOSIS — Z7901 Long term (current) use of anticoagulants: Secondary | ICD-10-CM | POA: Diagnosis not present

## 2016-12-21 DIAGNOSIS — I10 Essential (primary) hypertension: Secondary | ICD-10-CM | POA: Diagnosis not present

## 2016-12-21 DIAGNOSIS — M1991 Primary osteoarthritis, unspecified site: Secondary | ICD-10-CM | POA: Diagnosis not present

## 2016-12-21 DIAGNOSIS — R55 Syncope and collapse: Secondary | ICD-10-CM | POA: Diagnosis not present

## 2016-12-21 DIAGNOSIS — Z96651 Presence of right artificial knee joint: Secondary | ICD-10-CM | POA: Diagnosis not present

## 2016-12-21 DIAGNOSIS — Z9181 History of falling: Secondary | ICD-10-CM | POA: Diagnosis not present

## 2016-12-24 DIAGNOSIS — I10 Essential (primary) hypertension: Secondary | ICD-10-CM | POA: Diagnosis not present

## 2016-12-24 DIAGNOSIS — D509 Iron deficiency anemia, unspecified: Secondary | ICD-10-CM | POA: Diagnosis not present

## 2016-12-24 DIAGNOSIS — Z96641 Presence of right artificial hip joint: Secondary | ICD-10-CM | POA: Diagnosis not present

## 2016-12-24 DIAGNOSIS — Z7901 Long term (current) use of anticoagulants: Secondary | ICD-10-CM | POA: Diagnosis not present

## 2016-12-24 DIAGNOSIS — Z950 Presence of cardiac pacemaker: Secondary | ICD-10-CM | POA: Diagnosis not present

## 2016-12-24 DIAGNOSIS — Z96651 Presence of right artificial knee joint: Secondary | ICD-10-CM | POA: Diagnosis not present

## 2016-12-24 DIAGNOSIS — I4891 Unspecified atrial fibrillation: Secondary | ICD-10-CM | POA: Diagnosis not present

## 2016-12-24 DIAGNOSIS — E785 Hyperlipidemia, unspecified: Secondary | ICD-10-CM | POA: Diagnosis not present

## 2016-12-24 DIAGNOSIS — Z9181 History of falling: Secondary | ICD-10-CM | POA: Diagnosis not present

## 2016-12-24 DIAGNOSIS — R55 Syncope and collapse: Secondary | ICD-10-CM | POA: Diagnosis not present

## 2016-12-24 DIAGNOSIS — J449 Chronic obstructive pulmonary disease, unspecified: Secondary | ICD-10-CM | POA: Diagnosis not present

## 2016-12-24 DIAGNOSIS — M1991 Primary osteoarthritis, unspecified site: Secondary | ICD-10-CM | POA: Diagnosis not present

## 2016-12-25 DIAGNOSIS — E785 Hyperlipidemia, unspecified: Secondary | ICD-10-CM | POA: Diagnosis not present

## 2016-12-25 DIAGNOSIS — I4891 Unspecified atrial fibrillation: Secondary | ICD-10-CM | POA: Diagnosis not present

## 2016-12-25 DIAGNOSIS — Z96651 Presence of right artificial knee joint: Secondary | ICD-10-CM | POA: Diagnosis not present

## 2016-12-25 DIAGNOSIS — R55 Syncope and collapse: Secondary | ICD-10-CM | POA: Diagnosis not present

## 2016-12-25 DIAGNOSIS — I10 Essential (primary) hypertension: Secondary | ICD-10-CM | POA: Diagnosis not present

## 2016-12-25 DIAGNOSIS — Z96641 Presence of right artificial hip joint: Secondary | ICD-10-CM | POA: Diagnosis not present

## 2016-12-25 DIAGNOSIS — Z7901 Long term (current) use of anticoagulants: Secondary | ICD-10-CM | POA: Diagnosis not present

## 2016-12-25 DIAGNOSIS — J449 Chronic obstructive pulmonary disease, unspecified: Secondary | ICD-10-CM | POA: Diagnosis not present

## 2016-12-25 DIAGNOSIS — D509 Iron deficiency anemia, unspecified: Secondary | ICD-10-CM | POA: Diagnosis not present

## 2016-12-25 DIAGNOSIS — Z9181 History of falling: Secondary | ICD-10-CM | POA: Diagnosis not present

## 2016-12-25 DIAGNOSIS — Z950 Presence of cardiac pacemaker: Secondary | ICD-10-CM | POA: Diagnosis not present

## 2016-12-25 DIAGNOSIS — M1991 Primary osteoarthritis, unspecified site: Secondary | ICD-10-CM | POA: Diagnosis not present

## 2016-12-27 DIAGNOSIS — M159 Polyosteoarthritis, unspecified: Secondary | ICD-10-CM | POA: Diagnosis not present

## 2016-12-27 DIAGNOSIS — I4891 Unspecified atrial fibrillation: Secondary | ICD-10-CM | POA: Diagnosis not present

## 2016-12-27 DIAGNOSIS — I10 Essential (primary) hypertension: Secondary | ICD-10-CM | POA: Diagnosis not present

## 2016-12-27 DIAGNOSIS — J441 Chronic obstructive pulmonary disease with (acute) exacerbation: Secondary | ICD-10-CM | POA: Diagnosis not present

## 2016-12-28 DIAGNOSIS — D509 Iron deficiency anemia, unspecified: Secondary | ICD-10-CM | POA: Diagnosis not present

## 2016-12-28 DIAGNOSIS — Z950 Presence of cardiac pacemaker: Secondary | ICD-10-CM | POA: Diagnosis not present

## 2016-12-28 DIAGNOSIS — I4891 Unspecified atrial fibrillation: Secondary | ICD-10-CM | POA: Diagnosis not present

## 2016-12-28 DIAGNOSIS — I10 Essential (primary) hypertension: Secondary | ICD-10-CM | POA: Diagnosis not present

## 2016-12-28 DIAGNOSIS — Z7901 Long term (current) use of anticoagulants: Secondary | ICD-10-CM | POA: Diagnosis not present

## 2016-12-28 DIAGNOSIS — Z96641 Presence of right artificial hip joint: Secondary | ICD-10-CM | POA: Diagnosis not present

## 2016-12-28 DIAGNOSIS — J449 Chronic obstructive pulmonary disease, unspecified: Secondary | ICD-10-CM | POA: Diagnosis not present

## 2016-12-28 DIAGNOSIS — R55 Syncope and collapse: Secondary | ICD-10-CM | POA: Diagnosis not present

## 2016-12-28 DIAGNOSIS — M1991 Primary osteoarthritis, unspecified site: Secondary | ICD-10-CM | POA: Diagnosis not present

## 2016-12-28 DIAGNOSIS — E785 Hyperlipidemia, unspecified: Secondary | ICD-10-CM | POA: Diagnosis not present

## 2016-12-28 DIAGNOSIS — Z9181 History of falling: Secondary | ICD-10-CM | POA: Diagnosis not present

## 2016-12-28 DIAGNOSIS — Z96651 Presence of right artificial knee joint: Secondary | ICD-10-CM | POA: Diagnosis not present

## 2016-12-31 ENCOUNTER — Ambulatory Visit (INDEPENDENT_AMBULATORY_CARE_PROVIDER_SITE_OTHER): Payer: Medicare Other | Admitting: *Deleted

## 2016-12-31 DIAGNOSIS — Z5181 Encounter for therapeutic drug level monitoring: Secondary | ICD-10-CM

## 2016-12-31 DIAGNOSIS — D509 Iron deficiency anemia, unspecified: Secondary | ICD-10-CM | POA: Diagnosis not present

## 2016-12-31 DIAGNOSIS — Z950 Presence of cardiac pacemaker: Secondary | ICD-10-CM | POA: Diagnosis not present

## 2016-12-31 DIAGNOSIS — I4891 Unspecified atrial fibrillation: Secondary | ICD-10-CM | POA: Diagnosis not present

## 2016-12-31 DIAGNOSIS — Z96641 Presence of right artificial hip joint: Secondary | ICD-10-CM | POA: Diagnosis not present

## 2016-12-31 DIAGNOSIS — Z9181 History of falling: Secondary | ICD-10-CM | POA: Diagnosis not present

## 2016-12-31 DIAGNOSIS — I10 Essential (primary) hypertension: Secondary | ICD-10-CM | POA: Diagnosis not present

## 2016-12-31 DIAGNOSIS — Z96651 Presence of right artificial knee joint: Secondary | ICD-10-CM | POA: Diagnosis not present

## 2016-12-31 DIAGNOSIS — R55 Syncope and collapse: Secondary | ICD-10-CM | POA: Diagnosis not present

## 2016-12-31 DIAGNOSIS — Z7901 Long term (current) use of anticoagulants: Secondary | ICD-10-CM

## 2016-12-31 DIAGNOSIS — J449 Chronic obstructive pulmonary disease, unspecified: Secondary | ICD-10-CM | POA: Diagnosis not present

## 2016-12-31 DIAGNOSIS — E785 Hyperlipidemia, unspecified: Secondary | ICD-10-CM | POA: Diagnosis not present

## 2016-12-31 DIAGNOSIS — M1991 Primary osteoarthritis, unspecified site: Secondary | ICD-10-CM | POA: Diagnosis not present

## 2016-12-31 LAB — POCT INR: INR: 3.4

## 2017-01-01 DIAGNOSIS — E785 Hyperlipidemia, unspecified: Secondary | ICD-10-CM | POA: Diagnosis not present

## 2017-01-01 DIAGNOSIS — M1991 Primary osteoarthritis, unspecified site: Secondary | ICD-10-CM | POA: Diagnosis not present

## 2017-01-01 DIAGNOSIS — R55 Syncope and collapse: Secondary | ICD-10-CM | POA: Diagnosis not present

## 2017-01-01 DIAGNOSIS — I10 Essential (primary) hypertension: Secondary | ICD-10-CM | POA: Diagnosis not present

## 2017-01-01 DIAGNOSIS — D509 Iron deficiency anemia, unspecified: Secondary | ICD-10-CM | POA: Diagnosis not present

## 2017-01-01 DIAGNOSIS — J449 Chronic obstructive pulmonary disease, unspecified: Secondary | ICD-10-CM | POA: Diagnosis not present

## 2017-01-01 DIAGNOSIS — Z950 Presence of cardiac pacemaker: Secondary | ICD-10-CM | POA: Diagnosis not present

## 2017-01-01 DIAGNOSIS — Z7901 Long term (current) use of anticoagulants: Secondary | ICD-10-CM | POA: Diagnosis not present

## 2017-01-01 DIAGNOSIS — Z9181 History of falling: Secondary | ICD-10-CM | POA: Diagnosis not present

## 2017-01-01 DIAGNOSIS — Z96641 Presence of right artificial hip joint: Secondary | ICD-10-CM | POA: Diagnosis not present

## 2017-01-01 DIAGNOSIS — I4891 Unspecified atrial fibrillation: Secondary | ICD-10-CM | POA: Diagnosis not present

## 2017-01-01 DIAGNOSIS — Z96651 Presence of right artificial knee joint: Secondary | ICD-10-CM | POA: Diagnosis not present

## 2017-01-03 DIAGNOSIS — R55 Syncope and collapse: Secondary | ICD-10-CM | POA: Diagnosis not present

## 2017-01-03 DIAGNOSIS — Z950 Presence of cardiac pacemaker: Secondary | ICD-10-CM | POA: Diagnosis not present

## 2017-01-03 DIAGNOSIS — E785 Hyperlipidemia, unspecified: Secondary | ICD-10-CM | POA: Diagnosis not present

## 2017-01-03 DIAGNOSIS — Z96641 Presence of right artificial hip joint: Secondary | ICD-10-CM | POA: Diagnosis not present

## 2017-01-03 DIAGNOSIS — I4891 Unspecified atrial fibrillation: Secondary | ICD-10-CM | POA: Diagnosis not present

## 2017-01-03 DIAGNOSIS — Z96651 Presence of right artificial knee joint: Secondary | ICD-10-CM | POA: Diagnosis not present

## 2017-01-03 DIAGNOSIS — Z9181 History of falling: Secondary | ICD-10-CM | POA: Diagnosis not present

## 2017-01-03 DIAGNOSIS — Z7901 Long term (current) use of anticoagulants: Secondary | ICD-10-CM | POA: Diagnosis not present

## 2017-01-03 DIAGNOSIS — M1991 Primary osteoarthritis, unspecified site: Secondary | ICD-10-CM | POA: Diagnosis not present

## 2017-01-03 DIAGNOSIS — J449 Chronic obstructive pulmonary disease, unspecified: Secondary | ICD-10-CM | POA: Diagnosis not present

## 2017-01-03 DIAGNOSIS — I10 Essential (primary) hypertension: Secondary | ICD-10-CM | POA: Diagnosis not present

## 2017-01-03 DIAGNOSIS — D509 Iron deficiency anemia, unspecified: Secondary | ICD-10-CM | POA: Diagnosis not present

## 2017-01-07 DIAGNOSIS — I4891 Unspecified atrial fibrillation: Secondary | ICD-10-CM | POA: Diagnosis not present

## 2017-01-07 DIAGNOSIS — Z7901 Long term (current) use of anticoagulants: Secondary | ICD-10-CM | POA: Diagnosis not present

## 2017-01-07 DIAGNOSIS — J449 Chronic obstructive pulmonary disease, unspecified: Secondary | ICD-10-CM | POA: Diagnosis not present

## 2017-01-07 DIAGNOSIS — D509 Iron deficiency anemia, unspecified: Secondary | ICD-10-CM | POA: Diagnosis not present

## 2017-01-07 DIAGNOSIS — R55 Syncope and collapse: Secondary | ICD-10-CM | POA: Diagnosis not present

## 2017-01-07 DIAGNOSIS — Z9181 History of falling: Secondary | ICD-10-CM | POA: Diagnosis not present

## 2017-01-07 DIAGNOSIS — Z96641 Presence of right artificial hip joint: Secondary | ICD-10-CM | POA: Diagnosis not present

## 2017-01-07 DIAGNOSIS — Z950 Presence of cardiac pacemaker: Secondary | ICD-10-CM | POA: Diagnosis not present

## 2017-01-07 DIAGNOSIS — E785 Hyperlipidemia, unspecified: Secondary | ICD-10-CM | POA: Diagnosis not present

## 2017-01-07 DIAGNOSIS — M1991 Primary osteoarthritis, unspecified site: Secondary | ICD-10-CM | POA: Diagnosis not present

## 2017-01-07 DIAGNOSIS — I10 Essential (primary) hypertension: Secondary | ICD-10-CM | POA: Diagnosis not present

## 2017-01-07 DIAGNOSIS — Z96651 Presence of right artificial knee joint: Secondary | ICD-10-CM | POA: Diagnosis not present

## 2017-01-09 DIAGNOSIS — Z950 Presence of cardiac pacemaker: Secondary | ICD-10-CM | POA: Diagnosis not present

## 2017-01-09 DIAGNOSIS — R55 Syncope and collapse: Secondary | ICD-10-CM | POA: Diagnosis not present

## 2017-01-09 DIAGNOSIS — E785 Hyperlipidemia, unspecified: Secondary | ICD-10-CM | POA: Diagnosis not present

## 2017-01-09 DIAGNOSIS — Z9181 History of falling: Secondary | ICD-10-CM | POA: Diagnosis not present

## 2017-01-09 DIAGNOSIS — Z96641 Presence of right artificial hip joint: Secondary | ICD-10-CM | POA: Diagnosis not present

## 2017-01-09 DIAGNOSIS — I10 Essential (primary) hypertension: Secondary | ICD-10-CM | POA: Diagnosis not present

## 2017-01-09 DIAGNOSIS — Z96651 Presence of right artificial knee joint: Secondary | ICD-10-CM | POA: Diagnosis not present

## 2017-01-09 DIAGNOSIS — I4891 Unspecified atrial fibrillation: Secondary | ICD-10-CM | POA: Diagnosis not present

## 2017-01-09 DIAGNOSIS — Z7901 Long term (current) use of anticoagulants: Secondary | ICD-10-CM | POA: Diagnosis not present

## 2017-01-09 DIAGNOSIS — J449 Chronic obstructive pulmonary disease, unspecified: Secondary | ICD-10-CM | POA: Diagnosis not present

## 2017-01-09 DIAGNOSIS — M1991 Primary osteoarthritis, unspecified site: Secondary | ICD-10-CM | POA: Diagnosis not present

## 2017-01-09 DIAGNOSIS — D509 Iron deficiency anemia, unspecified: Secondary | ICD-10-CM | POA: Diagnosis not present

## 2017-01-17 DIAGNOSIS — H353132 Nonexudative age-related macular degeneration, bilateral, intermediate dry stage: Secondary | ICD-10-CM | POA: Diagnosis not present

## 2017-01-17 DIAGNOSIS — H40013 Open angle with borderline findings, low risk, bilateral: Secondary | ICD-10-CM | POA: Diagnosis not present

## 2017-01-17 DIAGNOSIS — H35033 Hypertensive retinopathy, bilateral: Secondary | ICD-10-CM | POA: Diagnosis not present

## 2017-01-17 DIAGNOSIS — Z961 Presence of intraocular lens: Secondary | ICD-10-CM | POA: Diagnosis not present

## 2017-01-18 DIAGNOSIS — Z7901 Long term (current) use of anticoagulants: Secondary | ICD-10-CM | POA: Diagnosis not present

## 2017-01-18 DIAGNOSIS — E785 Hyperlipidemia, unspecified: Secondary | ICD-10-CM | POA: Diagnosis not present

## 2017-01-18 DIAGNOSIS — Z950 Presence of cardiac pacemaker: Secondary | ICD-10-CM | POA: Diagnosis not present

## 2017-01-18 DIAGNOSIS — R55 Syncope and collapse: Secondary | ICD-10-CM | POA: Diagnosis not present

## 2017-01-18 DIAGNOSIS — M1991 Primary osteoarthritis, unspecified site: Secondary | ICD-10-CM | POA: Diagnosis not present

## 2017-01-18 DIAGNOSIS — D509 Iron deficiency anemia, unspecified: Secondary | ICD-10-CM | POA: Diagnosis not present

## 2017-01-18 DIAGNOSIS — Z96641 Presence of right artificial hip joint: Secondary | ICD-10-CM | POA: Diagnosis not present

## 2017-01-18 DIAGNOSIS — J449 Chronic obstructive pulmonary disease, unspecified: Secondary | ICD-10-CM | POA: Diagnosis not present

## 2017-01-18 DIAGNOSIS — Z9181 History of falling: Secondary | ICD-10-CM | POA: Diagnosis not present

## 2017-01-18 DIAGNOSIS — I4891 Unspecified atrial fibrillation: Secondary | ICD-10-CM | POA: Diagnosis not present

## 2017-01-18 DIAGNOSIS — Z96651 Presence of right artificial knee joint: Secondary | ICD-10-CM | POA: Diagnosis not present

## 2017-01-18 DIAGNOSIS — I10 Essential (primary) hypertension: Secondary | ICD-10-CM | POA: Diagnosis not present

## 2017-01-23 ENCOUNTER — Ambulatory Visit (INDEPENDENT_AMBULATORY_CARE_PROVIDER_SITE_OTHER): Payer: Medicare Other | Admitting: *Deleted

## 2017-01-23 DIAGNOSIS — I4891 Unspecified atrial fibrillation: Secondary | ICD-10-CM

## 2017-01-23 DIAGNOSIS — Z5181 Encounter for therapeutic drug level monitoring: Secondary | ICD-10-CM

## 2017-01-23 DIAGNOSIS — Z7901 Long term (current) use of anticoagulants: Secondary | ICD-10-CM | POA: Diagnosis not present

## 2017-01-23 LAB — POCT INR: INR: 2.6

## 2017-01-24 DIAGNOSIS — R55 Syncope and collapse: Secondary | ICD-10-CM | POA: Diagnosis not present

## 2017-01-24 DIAGNOSIS — D509 Iron deficiency anemia, unspecified: Secondary | ICD-10-CM | POA: Diagnosis not present

## 2017-01-24 DIAGNOSIS — Z950 Presence of cardiac pacemaker: Secondary | ICD-10-CM | POA: Diagnosis not present

## 2017-01-24 DIAGNOSIS — Z7901 Long term (current) use of anticoagulants: Secondary | ICD-10-CM | POA: Diagnosis not present

## 2017-01-24 DIAGNOSIS — I10 Essential (primary) hypertension: Secondary | ICD-10-CM | POA: Diagnosis not present

## 2017-01-24 DIAGNOSIS — Z9181 History of falling: Secondary | ICD-10-CM | POA: Diagnosis not present

## 2017-01-24 DIAGNOSIS — J449 Chronic obstructive pulmonary disease, unspecified: Secondary | ICD-10-CM | POA: Diagnosis not present

## 2017-01-24 DIAGNOSIS — Z96641 Presence of right artificial hip joint: Secondary | ICD-10-CM | POA: Diagnosis not present

## 2017-01-24 DIAGNOSIS — I4891 Unspecified atrial fibrillation: Secondary | ICD-10-CM | POA: Diagnosis not present

## 2017-01-24 DIAGNOSIS — Z96651 Presence of right artificial knee joint: Secondary | ICD-10-CM | POA: Diagnosis not present

## 2017-01-24 DIAGNOSIS — E785 Hyperlipidemia, unspecified: Secondary | ICD-10-CM | POA: Diagnosis not present

## 2017-01-24 DIAGNOSIS — M1991 Primary osteoarthritis, unspecified site: Secondary | ICD-10-CM | POA: Diagnosis not present

## 2017-01-29 DIAGNOSIS — M1991 Primary osteoarthritis, unspecified site: Secondary | ICD-10-CM | POA: Diagnosis not present

## 2017-01-29 DIAGNOSIS — Z96651 Presence of right artificial knee joint: Secondary | ICD-10-CM | POA: Diagnosis not present

## 2017-01-29 DIAGNOSIS — I10 Essential (primary) hypertension: Secondary | ICD-10-CM | POA: Diagnosis not present

## 2017-01-29 DIAGNOSIS — Z950 Presence of cardiac pacemaker: Secondary | ICD-10-CM | POA: Diagnosis not present

## 2017-01-29 DIAGNOSIS — Z9181 History of falling: Secondary | ICD-10-CM | POA: Diagnosis not present

## 2017-01-29 DIAGNOSIS — E785 Hyperlipidemia, unspecified: Secondary | ICD-10-CM | POA: Diagnosis not present

## 2017-01-29 DIAGNOSIS — D509 Iron deficiency anemia, unspecified: Secondary | ICD-10-CM | POA: Diagnosis not present

## 2017-01-29 DIAGNOSIS — I4891 Unspecified atrial fibrillation: Secondary | ICD-10-CM | POA: Diagnosis not present

## 2017-01-29 DIAGNOSIS — Z96641 Presence of right artificial hip joint: Secondary | ICD-10-CM | POA: Diagnosis not present

## 2017-01-29 DIAGNOSIS — J449 Chronic obstructive pulmonary disease, unspecified: Secondary | ICD-10-CM | POA: Diagnosis not present

## 2017-01-29 DIAGNOSIS — R55 Syncope and collapse: Secondary | ICD-10-CM | POA: Diagnosis not present

## 2017-01-29 DIAGNOSIS — Z7901 Long term (current) use of anticoagulants: Secondary | ICD-10-CM | POA: Diagnosis not present

## 2017-02-07 DIAGNOSIS — H353133 Nonexudative age-related macular degeneration, bilateral, advanced atrophic without subfoveal involvement: Secondary | ICD-10-CM | POA: Diagnosis not present

## 2017-02-07 DIAGNOSIS — H35363 Drusen (degenerative) of macula, bilateral: Secondary | ICD-10-CM | POA: Diagnosis not present

## 2017-02-07 DIAGNOSIS — H35371 Puckering of macula, right eye: Secondary | ICD-10-CM | POA: Diagnosis not present

## 2017-02-08 DIAGNOSIS — M1991 Primary osteoarthritis, unspecified site: Secondary | ICD-10-CM | POA: Diagnosis not present

## 2017-02-08 DIAGNOSIS — R55 Syncope and collapse: Secondary | ICD-10-CM | POA: Diagnosis not present

## 2017-02-08 DIAGNOSIS — Z950 Presence of cardiac pacemaker: Secondary | ICD-10-CM | POA: Diagnosis not present

## 2017-02-08 DIAGNOSIS — E785 Hyperlipidemia, unspecified: Secondary | ICD-10-CM | POA: Diagnosis not present

## 2017-02-08 DIAGNOSIS — I4891 Unspecified atrial fibrillation: Secondary | ICD-10-CM | POA: Diagnosis not present

## 2017-02-08 DIAGNOSIS — D509 Iron deficiency anemia, unspecified: Secondary | ICD-10-CM | POA: Diagnosis not present

## 2017-02-08 DIAGNOSIS — Z96641 Presence of right artificial hip joint: Secondary | ICD-10-CM | POA: Diagnosis not present

## 2017-02-08 DIAGNOSIS — J449 Chronic obstructive pulmonary disease, unspecified: Secondary | ICD-10-CM | POA: Diagnosis not present

## 2017-02-08 DIAGNOSIS — Z7901 Long term (current) use of anticoagulants: Secondary | ICD-10-CM | POA: Diagnosis not present

## 2017-02-08 DIAGNOSIS — Z96651 Presence of right artificial knee joint: Secondary | ICD-10-CM | POA: Diagnosis not present

## 2017-02-08 DIAGNOSIS — Z9181 History of falling: Secondary | ICD-10-CM | POA: Diagnosis not present

## 2017-02-08 DIAGNOSIS — I10 Essential (primary) hypertension: Secondary | ICD-10-CM | POA: Diagnosis not present

## 2017-02-14 ENCOUNTER — Ambulatory Visit (INDEPENDENT_AMBULATORY_CARE_PROVIDER_SITE_OTHER): Payer: Medicare Other | Admitting: Cardiology

## 2017-02-14 ENCOUNTER — Encounter: Payer: Self-pay | Admitting: Cardiology

## 2017-02-14 VITALS — BP 132/78 | HR 67 | Ht 65.0 in | Wt 158.0 lb

## 2017-02-14 DIAGNOSIS — I4891 Unspecified atrial fibrillation: Secondary | ICD-10-CM

## 2017-02-14 DIAGNOSIS — I495 Sick sinus syndrome: Secondary | ICD-10-CM | POA: Diagnosis not present

## 2017-02-14 DIAGNOSIS — I10 Essential (primary) hypertension: Secondary | ICD-10-CM | POA: Diagnosis not present

## 2017-02-14 DIAGNOSIS — Z950 Presence of cardiac pacemaker: Secondary | ICD-10-CM

## 2017-02-14 NOTE — Patient Instructions (Signed)

## 2017-02-14 NOTE — Progress Notes (Signed)
Clinical Summary [name] is a 83 y.o.female seen today for follow up of the following medical problems.    1. Afib   - no recent palpitations - no bleeding troubles on coumadin.   2. Tachy-Brady Syndrome  - has permanent pacemaker followed by EP [name] - normal pacemaker function Jan 2018. No recent symptoms.    3. HTN  - compliant with meds   4. Fall - recent fall at home. Noted to be deconditioned, placed in rehab now back home.    SH: retired cardiology nurse at [employer]   Past Medical History:  Diagnosis Date  . Arthritis   . Atrial fibrillation (HCC)   . COPD (chronic obstructive pulmonary disease) (HCC)   . Hip pain, left   . Hypertension   . Sinoatrial node dysfunction (HCC)      No Known Allergies   Current Outpatient Prescriptions  Medication Sig Dispense Refill  . albuterol (PROVENTIL HFA;VENTOLIN HFA) 108 (90 Base) MCG/ACT inhaler Inhale 2 puffs into the lungs every 6 (six) hours as needed for wheezing or shortness of breath. 1 Inhaler 0  . lisinopril (PRINIVIL,ZESTRIL) 40 MG tablet TAKE 1 TABLET BY MOUTH EVERY DAY 90 tablet 3  . metoprolol tartrate (LOPRESSOR) 25 MG tablet Take 1 tablet (25 mg total) by mouth 2 (two) times daily. 180 tablet 3  . Multiple Vitamin (MULTIVITAMIN) tablet Take 1 tablet by mouth daily.    Marland Kitchen warfarin (COUMADIN) 4 MG tablet Starting 7 days after fall, take 6 mg every day, except Tuesdays when you should take 4 mg. 135 tablet 3   No current facility-administered medications for this visit.      Past Surgical History:  Procedure Laterality Date  . BACK SURGERY    . EYE SURGERY Bilateral    cataract removal  . hip arthoplasty     total  . KNEE ARTHROPLASTY    . uterine polyp removal       No Known Allergies    Family History  Problem Relation Age of Onset  . Prostate cancer Father   . Heart failure Father   . Heart disease Father   . Stroke Sister   . Depression Maternal Grandmother       Social History [name] reports that she has never smoked. She has never used smokeless tobacco. [name] reports that she does not drink alcohol.   Review of Systems CONSTITUTIONAL: No weight loss, fever, chills, weakness or fatigue.  HEENT: Eyes: No visual loss, blurred vision, double vision or yellow sclerae.No hearing loss, sneezing, congestion, runny nose or sore throat.  SKIN: No rash or itching.  CARDIOVASCULAR: per hpi RESPIRATORY: No shortness of breath, cough or sputum.  GASTROINTESTINAL: No anorexia, nausea, vomiting or diarrhea. No abdominal pain or blood.  GENITOURINARY: No burning on urination, no polyuria NEUROLOGICAL: No headache, dizziness, syncope, paralysis, ataxia, numbness or tingling in the extremities. No change in bowel or bladder control.  MUSCULOSKELETAL: No muscle, back pain, joint pain or stiffness.  LYMPHATICS: No enlarged nodes. No history of splenectomy.  PSYCHIATRIC: No history of depression or anxiety.  ENDOCRINOLOGIC: No reports of sweating, cold or heat intolerance. No polyuria or polydipsia.  Marland Kitchen   Physical Examination Vitals:   02/14/17 1123  BP: 132/78  Pulse: 67   Vitals:   02/14/17 1123  Weight: 158 lb (71.7 kg)  Height:  (1.651 m)    Gen: resting comfortably, no acute distress HEENT: no scleral icterus, pupils equal round and  reactive, no palptable cervical adenopathy,  CV: RRR, n om/r/g, no jvd Resp: Clear to auscultation bilaterally GI: abdomen is soft, non-tender, non-distended, normal bowel sounds, no hepatosplenomegaly MSK: extremities are warm, no edema.  Skin: warm, no rash Neuro:  no focal deficits Psych: appropriate affect      Assessment and Plan   1. Afib  - no recent palpitations.  - We will continue current meds including coumadin, CHADS2Vasc score is 4.    2. Tachy-brady syndrome  - normal device check at last visit.  - continue to monitor  3. HTN  -her bp is  at goal, she will  continue current meds   F/u 6 months.     Antoine Poche, M.D

## 2017-02-18 ENCOUNTER — Ambulatory Visit (INDEPENDENT_AMBULATORY_CARE_PROVIDER_SITE_OTHER): Payer: Medicare Other | Admitting: *Deleted

## 2017-02-18 DIAGNOSIS — Z7901 Long term (current) use of anticoagulants: Secondary | ICD-10-CM | POA: Diagnosis not present

## 2017-02-18 DIAGNOSIS — I4891 Unspecified atrial fibrillation: Secondary | ICD-10-CM | POA: Diagnosis not present

## 2017-02-18 DIAGNOSIS — Z5181 Encounter for therapeutic drug level monitoring: Secondary | ICD-10-CM | POA: Diagnosis not present

## 2017-02-18 LAB — POCT INR: INR: 2.1

## 2017-04-01 ENCOUNTER — Ambulatory Visit (INDEPENDENT_AMBULATORY_CARE_PROVIDER_SITE_OTHER): Payer: Medicare Other | Admitting: *Deleted

## 2017-04-01 DIAGNOSIS — Z7901 Long term (current) use of anticoagulants: Secondary | ICD-10-CM | POA: Diagnosis not present

## 2017-04-01 DIAGNOSIS — Z5181 Encounter for therapeutic drug level monitoring: Secondary | ICD-10-CM | POA: Diagnosis not present

## 2017-04-01 DIAGNOSIS — I4891 Unspecified atrial fibrillation: Secondary | ICD-10-CM | POA: Diagnosis not present

## 2017-04-01 LAB — POCT INR: INR: 4.6

## 2017-04-05 ENCOUNTER — Encounter: Payer: Self-pay | Admitting: Internal Medicine

## 2017-04-05 ENCOUNTER — Ambulatory Visit (INDEPENDENT_AMBULATORY_CARE_PROVIDER_SITE_OTHER): Payer: Medicare Other | Admitting: Internal Medicine

## 2017-04-05 DIAGNOSIS — I4821 Permanent atrial fibrillation: Secondary | ICD-10-CM

## 2017-04-05 DIAGNOSIS — I482 Chronic atrial fibrillation: Secondary | ICD-10-CM

## 2017-04-05 LAB — CUP PACEART INCLINIC DEVICE CHECK
Battery Voltage: 2.72 V
Implantable Lead Implant Date: 20070518
Implantable Lead Implant Date: 20070518
Implantable Lead Location: 753859
Implantable Lead Location: 753860
Implantable Pulse Generator Implant Date: 20070518
Lead Channel Pacing Threshold Amplitude: 0.5 V
Lead Channel Pacing Threshold Pulse Width: 0.5 ms
Lead Channel Sensing Intrinsic Amplitude: 11.5 mV
MDC IDC MSMT BATTERY IMPEDANCE: 5100 Ohm
MDC IDC MSMT LEADCHNL RV IMPEDANCE VALUE: 547 Ohm
MDC IDC SESS DTM: 20180525133027
MDC IDC SET LEADCHNL RV PACING AMPLITUDE: 2.5 V
MDC IDC SET LEADCHNL RV PACING PULSEWIDTH: 0.5 ms
MDC IDC SET LEADCHNL RV SENSING SENSITIVITY: 2 mV
Pulse Gen Serial Number: 1683162

## 2017-04-05 NOTE — Progress Notes (Signed)
HPI [name] returns today for followup. She is a very pleasant 83 year old woman with symptomatic tachycardia bradycardia syndrome, atrial fibrillation, hypertension, on chronic Coumadin therapy. She has severe arthritis and uses a walker now for ambulation. She denies palpitations. No syncope. She still has chronic back pain. No sob or chest pain. Minimal edema She is approaching ERI. No change since I saw her last. No Known Allergies   Current Outpatient Prescriptions  Medication Sig Dispense Refill  . albuterol (PROVENTIL HFA;VENTOLIN HFA) 108 (90 Base) MCG/ACT inhaler Inhale 2 puffs into the lungs every 6 (six) hours as needed for wheezing or shortness of breath. 1 Inhaler 0  . lisinopril (PRINIVIL,ZESTRIL) 40 MG tablet TAKE 1 TABLET BY MOUTH EVERY DAY 90 tablet 3  . metoprolol tartrate (LOPRESSOR) 25 MG tablet Take 1 tablet (25 mg total) by mouth 2 (two) times daily. 180 tablet 3  . Multiple Vitamin (MULTIVITAMIN) tablet Take 1 tablet by mouth daily.    Marland Kitchen. warfarin (COUMADIN) 4 MG tablet Starting 7 days after fall, take 6 mg every day, except Tuesdays when you should take 4 mg. 135 tablet 3   No current facility-administered medications for this visit.      Past Medical History:  Diagnosis Date  . Arthritis   . Atrial fibrillation (HCC)   . COPD (chronic obstructive pulmonary disease) (HCC)   . Hip pain, left   . Hypertension   . Sinoatrial node dysfunction (HCC)     ROS:   All systems reviewed and negative except as noted in the HPI.   Past Surgical History:  Procedure Laterality Date  . BACK SURGERY    . EYE SURGERY Bilateral    cataract removal  . hip arthoplasty     total  . KNEE ARTHROPLASTY    . uterine polyp removal       Family History  Problem Relation Age of Onset  . Prostate cancer Father   . Heart failure Father   . Heart disease Father   . Stroke Sister   . Depression Maternal Grandmother      Social History   Social History  . Marital  status: Widowed    Spouse name: N/A  . Number of children: N/A  . Years of education: N/A   Occupational History  . Retired Retired   Social History Main Topics  . Smoking status: Never Smoker  . Smokeless tobacco: Never Used  . Alcohol use No  . Drug use: No  . Sexual activity: Not Currently    Birth control/ protection: Post-menopausal, None   Other Topics Concern  . Not on file   Social History Narrative  . No narrative on file     BP (!) 156/70   Pulse 77   Ht 5\' 5"  (1.651 m)   Wt 155 lb (70.3 kg)   SpO2 97%   BMI 25.79 kg/m   Physical Exam:  stable appearing elderly woman, NAD HEENT: Unremarkable Neck:  6 cm JVD, no thyromegally Lungs:  Clear with no wheezes, rales, or rhonchi. HEART:  IRegular rate rhythm, no murmurs, no rubs, no clicks Abd:  soft, positive bowel sounds, no organomegally, no rebound, no guarding Ext:  2 plus pulses, no edema, no cyanosis, no clubbing Skin:  No rashes no nodules Neuro:  CN II through XII intact, motor grossly intact   DEVICE  Normal device function.  See PaceArt for details. Underlying rhythm is atrial fibrillation  Assess/Plan:  1. Atrial fib - her ventricular rate is controlled.  She will continue warfarin. Refuses NOAC. 2. AV block - she has intermittent CHB and is pacing 66% or the time. 3. PPM - she is programmed VVIR. Followup in several months. 4. Coags - she will continue warfarin. No bleeding.  Leonia Reeves.D.

## 2017-04-05 NOTE — Patient Instructions (Signed)
Your physician wants you to follow-up in: 1 Year with Dr. Ladona Ridgelaylor. You will receive a reminder letter in the mail two months in advance. If you don't receive a letter, please call our office to schedule the follow-up appointment.  Your physician recommends that you continue on your current medications as directed. Please refer to the Current Medication list given to you today.  Your physician recommends that you schedule a follow-up appointment in 6 Months with Device [facility]   If you need a refill on your cardiac medications before your next appointment, please call your pharmacy.  Thank you for choosing [facility]!

## 2017-04-17 ENCOUNTER — Ambulatory Visit (INDEPENDENT_AMBULATORY_CARE_PROVIDER_SITE_OTHER): Payer: Medicare Other | Admitting: *Deleted

## 2017-04-17 DIAGNOSIS — Z5181 Encounter for therapeutic drug level monitoring: Secondary | ICD-10-CM

## 2017-04-17 DIAGNOSIS — J449 Chronic obstructive pulmonary disease, unspecified: Secondary | ICD-10-CM | POA: Diagnosis not present

## 2017-04-17 DIAGNOSIS — I10 Essential (primary) hypertension: Secondary | ICD-10-CM | POA: Diagnosis not present

## 2017-04-17 DIAGNOSIS — I4891 Unspecified atrial fibrillation: Secondary | ICD-10-CM

## 2017-04-17 DIAGNOSIS — Z7901 Long term (current) use of anticoagulants: Secondary | ICD-10-CM

## 2017-04-17 DIAGNOSIS — J301 Allergic rhinitis due to pollen: Secondary | ICD-10-CM | POA: Diagnosis not present

## 2017-04-17 LAB — POCT INR: INR: 2.1

## 2017-04-19 ENCOUNTER — Other Ambulatory Visit: Payer: Self-pay | Admitting: *Deleted

## 2017-04-19 MED ORDER — WARFARIN SODIUM 4 MG PO TABS: ORAL_TABLET | ORAL | 1 refills | Status: DC

## 2017-05-20 ENCOUNTER — Ambulatory Visit (INDEPENDENT_AMBULATORY_CARE_PROVIDER_SITE_OTHER): Payer: Medicare Other | Admitting: *Deleted

## 2017-05-20 DIAGNOSIS — Z5181 Encounter for therapeutic drug level monitoring: Secondary | ICD-10-CM | POA: Diagnosis not present

## 2017-05-20 DIAGNOSIS — Z7901 Long term (current) use of anticoagulants: Secondary | ICD-10-CM

## 2017-05-20 DIAGNOSIS — I4891 Unspecified atrial fibrillation: Secondary | ICD-10-CM

## 2017-05-20 LAB — POCT INR: INR: 2.1

## 2017-05-22 DIAGNOSIS — M545 Low back pain: Secondary | ICD-10-CM | POA: Diagnosis not present

## 2017-05-22 DIAGNOSIS — I10 Essential (primary) hypertension: Secondary | ICD-10-CM | POA: Diagnosis not present

## 2017-05-22 DIAGNOSIS — J449 Chronic obstructive pulmonary disease, unspecified: Secondary | ICD-10-CM | POA: Diagnosis not present

## 2017-05-22 DIAGNOSIS — I4891 Unspecified atrial fibrillation: Secondary | ICD-10-CM | POA: Diagnosis not present

## 2017-06-17 DIAGNOSIS — H524 Presbyopia: Secondary | ICD-10-CM | POA: Diagnosis not present

## 2017-06-17 DIAGNOSIS — H02132 Senile ectropion of right lower eyelid: Secondary | ICD-10-CM | POA: Diagnosis not present

## 2017-06-17 DIAGNOSIS — H40013 Open angle with borderline findings, low risk, bilateral: Secondary | ICD-10-CM | POA: Diagnosis not present

## 2017-06-19 ENCOUNTER — Ambulatory Visit (INDEPENDENT_AMBULATORY_CARE_PROVIDER_SITE_OTHER): Payer: Medicare Other | Admitting: *Deleted

## 2017-06-19 DIAGNOSIS — I4891 Unspecified atrial fibrillation: Secondary | ICD-10-CM | POA: Diagnosis not present

## 2017-06-19 DIAGNOSIS — Z5181 Encounter for therapeutic drug level monitoring: Secondary | ICD-10-CM

## 2017-06-19 DIAGNOSIS — Z7901 Long term (current) use of anticoagulants: Secondary | ICD-10-CM

## 2017-06-19 LAB — POCT INR: INR: 2.1

## 2017-07-03 DIAGNOSIS — I4891 Unspecified atrial fibrillation: Secondary | ICD-10-CM | POA: Diagnosis not present

## 2017-07-03 DIAGNOSIS — I10 Essential (primary) hypertension: Secondary | ICD-10-CM | POA: Diagnosis not present

## 2017-07-03 DIAGNOSIS — J449 Chronic obstructive pulmonary disease, unspecified: Secondary | ICD-10-CM | POA: Diagnosis not present

## 2017-07-06 DIAGNOSIS — J45909 Unspecified asthma, uncomplicated: Secondary | ICD-10-CM | POA: Diagnosis not present

## 2017-07-11 ENCOUNTER — Ambulatory Visit (INDEPENDENT_AMBULATORY_CARE_PROVIDER_SITE_OTHER): Payer: Medicare Other | Admitting: *Deleted

## 2017-07-11 DIAGNOSIS — I495 Sick sinus syndrome: Secondary | ICD-10-CM

## 2017-07-11 LAB — CUP PACEART INCLINIC DEVICE CHECK
Battery Impedance: 8400 Ohm
Battery Remaining Longevity: 18 mo
Battery Voltage: 2.68 V
Date Time Interrogation Session: 20180830135923
Implantable Lead Implant Date: 20070518
Implantable Lead Location: 753859
Implantable Lead Location: 753860
Lead Channel Pacing Threshold Amplitude: 0.5 V
Lead Channel Sensing Intrinsic Amplitude: 10.6 mV
Lead Channel Setting Sensing Sensitivity: 2 mV
MDC IDC LEAD IMPLANT DT: 20070518
MDC IDC MSMT LEADCHNL RV IMPEDANCE VALUE: 585 Ohm
MDC IDC MSMT LEADCHNL RV PACING THRESHOLD PULSEWIDTH: 0.5 ms
MDC IDC PG IMPLANT DT: 20070518
MDC IDC SET LEADCHNL RV PACING AMPLITUDE: 2.5 V
MDC IDC SET LEADCHNL RV PACING PULSEWIDTH: 0.5 ms
Pulse Gen Serial Number: 1683162

## 2017-07-11 NOTE — Progress Notes (Signed)
Pacemaker check in clinic. Normal device function. Threshold, sensing, and impedance consistent with previous measurements. Device programmed to maximize longevity. No high ventricular rates noted. Device programmed at appropriate safety margins. Histogram distribution appropriate for patient activity level. Device programmed to optimize intrinsic conduction. Estimated longevity 1.5 years (after EGM storage turned off). Patient will follow up with DC/R in 6 months.

## 2017-07-31 ENCOUNTER — Encounter: Payer: Self-pay | Admitting: *Deleted

## 2017-08-08 DIAGNOSIS — H35371 Puckering of macula, right eye: Secondary | ICD-10-CM | POA: Diagnosis not present

## 2017-08-08 DIAGNOSIS — H353123 Nonexudative age-related macular degeneration, left eye, advanced atrophic without subfoveal involvement: Secondary | ICD-10-CM | POA: Diagnosis not present

## 2017-08-08 DIAGNOSIS — H353114 Nonexudative age-related macular degeneration, right eye, advanced atrophic with subfoveal involvement: Secondary | ICD-10-CM | POA: Diagnosis not present

## 2017-08-08 DIAGNOSIS — H43811 Vitreous degeneration, right eye: Secondary | ICD-10-CM | POA: Diagnosis not present

## 2017-08-20 DIAGNOSIS — J45909 Unspecified asthma, uncomplicated: Secondary | ICD-10-CM | POA: Diagnosis not present

## 2017-08-28 ENCOUNTER — Encounter: Payer: Self-pay | Admitting: Cardiology

## 2017-08-28 ENCOUNTER — Ambulatory Visit (INDEPENDENT_AMBULATORY_CARE_PROVIDER_SITE_OTHER): Payer: Medicare Other | Admitting: Cardiology

## 2017-08-28 ENCOUNTER — Ambulatory Visit (INDEPENDENT_AMBULATORY_CARE_PROVIDER_SITE_OTHER): Payer: Medicare Other | Admitting: *Deleted

## 2017-08-28 VITALS — BP 122/74 | HR 70 | Ht 65.0 in | Wt 158.0 lb

## 2017-08-28 DIAGNOSIS — I4891 Unspecified atrial fibrillation: Secondary | ICD-10-CM

## 2017-08-28 DIAGNOSIS — Z5181 Encounter for therapeutic drug level monitoring: Secondary | ICD-10-CM

## 2017-08-28 DIAGNOSIS — I10 Essential (primary) hypertension: Secondary | ICD-10-CM | POA: Diagnosis not present

## 2017-08-28 DIAGNOSIS — I495 Sick sinus syndrome: Secondary | ICD-10-CM

## 2017-08-28 DIAGNOSIS — Z7901 Long term (current) use of anticoagulants: Secondary | ICD-10-CM

## 2017-08-28 LAB — POCT INR: INR: 2.6

## 2017-08-28 MED ORDER — WARFARIN SODIUM 4 MG PO TABS: ORAL_TABLET | ORAL | 3 refills | Status: DC

## 2017-08-28 NOTE — Patient Instructions (Signed)

## 2017-08-28 NOTE — Progress Notes (Signed)
Clinical Summary [name] is a 83 y.o.female seen today for follow up of the following medical problems.    1. Afib  - no recent palpitations - compliant with meds. No bleeding troubles on coumadin.    2. 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 No recent symptoms   3. HTN  - she is compliant with meds  .    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 Arthritis   . Atrial fibrillation (HCC)   . COPD (chronic obstructive pulmonary disease) (HCC)   . Hip pain, left   . Hypertension   . Sinoatrial node dysfunction (HCC)      No Known Allergies   Current Outpatient Prescriptions  Medication Sig Dispense Refill  . albuterol (PROVENTIL HFA;VENTOLIN HFA) 108 (90 Base) MCG/ACT inhaler Inhale 2 puffs into the lungs every 6 (six) hours as needed for wheezing or shortness of breath. 1 Inhaler 0  . lisinopril (PRINIVIL,ZESTRIL) 40 MG tablet TAKE 1 TABLET BY MOUTH EVERY DAY 90 tablet 3  . metoprolol tartrate (LOPRESSOR) 25 MG tablet Take 1 tablet (25 mg total) by mouth 2 (two) times daily. 180 tablet 3  . Multiple Vitamin (MULTIVITAMIN) tablet Take 1 tablet by mouth daily.    Marland Kitchen warfarin (COUMADIN) 4 MG tablet Take 6 mg every day, except Tuesdays when you should take 4 mg. 135 tablet 1   No current facility-administered medications for this visit.      Past Surgical History:  Procedure Laterality Date  . BACK SURGERY    . EYE SURGERY Bilateral    cataract removal  . hip arthoplasty     total  . KNEE ARTHROPLASTY    . uterine polyp removal       No Known Allergies    Family History  Problem Relation Age of Onset  . Prostate cancer Father   . Heart failure Father   . Heart disease Father   . Stroke Sister   . Depression Maternal Grandmother      Social History [name] reports that she has never smoked.  She has never used smokeless tobacco. [name] reports that she does not drink alcohol.   Review of Systems CONSTITUTIONAL: No weight loss, fever, chills, weakness or fatigue.  HEENT: Eyes: No visual loss, blurred vision, double vision or yellow sclerae.No hearing loss, sneezing, congestion, runny nose or sore throat.  SKIN: No rash or itching.  CARDIOVASCULAR: per hpi RESPIRATORY: No shortness of breath, cough or sputum.  GASTROINTESTINAL: No anorexia, nausea, vomiting or diarrhea. No abdominal pain or blood.  GENITOURINARY: No burning on urination, no polyuria NEUROLOGICAL: No headache, dizziness, syncope, paralysis, ataxia, numbness or tingling in the extremities. No change in bowel or bladder control.  MUSCULOSKELETAL: No muscle, back pain, joint pain or stiffness.  LYMPHATICS: No enlarged nodes. No history of splenectomy.  PSYCHIATRIC: No history of depression or anxiety.  ENDOCRINOLOGIC: No reports of sweating, cold or heat intolerance. No polyuria or polydipsia.  Marland Kitchen   Physical Examination Vitals:   08/28/17 1409  BP: 122/74  Pulse: 70  SpO2: 97%   Vitals:   08/28/17 1409  Weight: 158 lb (71.7 kg)  Height: 5\' 5"  (1.651 m)    Gen: resting comfortably, no acute distress HEENT: no scleral icterus, pupils equal round and reactive, no palptable cervical adenopathy,  CV: RRR, no m/r/g, no jvd  Resp: Clear to auscultation bilaterally GI: abdomen is soft, non-tender, non-distended, normal bowel sounds, no hepatosplenomegaly MSK: extremities are warm, no edema.  Skin: warm, no rash Neuro:  no focal deficits Psych: appropriate affect    Assessment and Plan   1. Afib  - no symptoms - continue current meds including anticoauglation, CHADS2Vasc score is 4    2. Tachy-brady syndrome  - no symptoms, conitnue to follow in device clinic  3. HTN  -bp is at goal, conitnue current meds   F/u 6 months.     Antoine PocheJonathan F. Pavan Bring, M.D.

## 2017-10-08 DIAGNOSIS — I5032 Chronic diastolic (congestive) heart failure: Secondary | ICD-10-CM | POA: Diagnosis not present

## 2017-10-08 DIAGNOSIS — I4891 Unspecified atrial fibrillation: Secondary | ICD-10-CM | POA: Diagnosis not present

## 2017-10-08 DIAGNOSIS — Z23 Encounter for immunization: Secondary | ICD-10-CM | POA: Diagnosis not present

## 2017-10-08 DIAGNOSIS — I10 Essential (primary) hypertension: Secondary | ICD-10-CM | POA: Diagnosis not present

## 2017-10-08 DIAGNOSIS — J449 Chronic obstructive pulmonary disease, unspecified: Secondary | ICD-10-CM | POA: Diagnosis not present

## 2017-10-09 ENCOUNTER — Ambulatory Visit (INDEPENDENT_AMBULATORY_CARE_PROVIDER_SITE_OTHER): Payer: Medicare Other | Admitting: *Deleted

## 2017-10-09 DIAGNOSIS — I4891 Unspecified atrial fibrillation: Secondary | ICD-10-CM

## 2017-10-09 DIAGNOSIS — Z5181 Encounter for therapeutic drug level monitoring: Secondary | ICD-10-CM

## 2017-10-09 DIAGNOSIS — Z7901 Long term (current) use of anticoagulants: Secondary | ICD-10-CM | POA: Diagnosis not present

## 2017-10-09 LAB — POCT INR: INR: 2.1

## 2017-11-20 ENCOUNTER — Ambulatory Visit (INDEPENDENT_AMBULATORY_CARE_PROVIDER_SITE_OTHER): Payer: Medicare Other | Admitting: *Deleted

## 2017-11-20 DIAGNOSIS — I4891 Unspecified atrial fibrillation: Secondary | ICD-10-CM | POA: Diagnosis not present

## 2017-11-20 DIAGNOSIS — Z5181 Encounter for therapeutic drug level monitoring: Secondary | ICD-10-CM | POA: Diagnosis not present

## 2017-11-20 DIAGNOSIS — Z7901 Long term (current) use of anticoagulants: Secondary | ICD-10-CM

## 2017-11-20 LAB — POCT INR: INR: 2.3

## 2017-11-20 NOTE — Patient Instructions (Signed)
Continue coumadin 1 1/2 tablets daily except 1 tablet on Tuesdays Recheck in 6 weeks

## 2017-12-13 ENCOUNTER — Ambulatory Visit (INDEPENDENT_AMBULATORY_CARE_PROVIDER_SITE_OTHER): Payer: Medicare Other | Admitting: *Deleted

## 2017-12-13 DIAGNOSIS — I482 Chronic atrial fibrillation: Secondary | ICD-10-CM | POA: Diagnosis not present

## 2017-12-13 DIAGNOSIS — I4821 Permanent atrial fibrillation: Secondary | ICD-10-CM

## 2017-12-13 DIAGNOSIS — I495 Sick sinus syndrome: Secondary | ICD-10-CM

## 2017-12-13 LAB — CUP PACEART INCLINIC DEVICE CHECK
Date Time Interrogation Session: 20190201160333
Implantable Lead Implant Date: 20070518
Implantable Lead Location: 753859
Lead Channel Impedance Value: 561 Ohm
Lead Channel Pacing Threshold Amplitude: 0.75 V
Lead Channel Pacing Threshold Pulse Width: 0.5 ms
Lead Channel Sensing Intrinsic Amplitude: 12 mV
Lead Channel Setting Pacing Amplitude: 2.5 V
Lead Channel Setting Sensing Sensitivity: 2 mV
MDC IDC LEAD IMPLANT DT: 20070518
MDC IDC LEAD LOCATION: 753860
MDC IDC MSMT BATTERY IMPEDANCE: 12700 Ohm
MDC IDC MSMT BATTERY REMAINING LONGEVITY: 9 mo
MDC IDC MSMT BATTERY VOLTAGE: 2.66 V
MDC IDC PG IMPLANT DT: 20070518
MDC IDC SET LEADCHNL RV PACING PULSEWIDTH: 0.5 ms
Pulse Gen Serial Number: 1683162

## 2017-12-13 NOTE — Progress Notes (Signed)
Pacemaker check in clinic. Normal device function. Threshold, sensing, and impedance consistent with previous measurements. Device programmed to maximize longevity. Episode triggers are off. Device programmed at appropriate safety margins. Histogram distribution appropriate for patient activity level. Device programmed to optimize intrinsic conduction. Estimated longevity 0.75 years. Patient will follow up with GT/R on 5/31 @ 0945.

## 2018-01-06 ENCOUNTER — Ambulatory Visit (INDEPENDENT_AMBULATORY_CARE_PROVIDER_SITE_OTHER): Payer: Medicare Other | Admitting: *Deleted

## 2018-01-06 DIAGNOSIS — I4891 Unspecified atrial fibrillation: Secondary | ICD-10-CM | POA: Diagnosis not present

## 2018-01-06 DIAGNOSIS — Z5181 Encounter for therapeutic drug level monitoring: Secondary | ICD-10-CM

## 2018-01-06 DIAGNOSIS — Z7901 Long term (current) use of anticoagulants: Secondary | ICD-10-CM

## 2018-01-06 LAB — POCT INR: INR: 2.3

## 2018-01-06 NOTE — Patient Instructions (Signed)
Continue coumadin 1 1/2 tablets daily except 1 tablet on Tuesdays Recheck in 6 weeks 

## 2018-01-08 DIAGNOSIS — I10 Essential (primary) hypertension: Secondary | ICD-10-CM | POA: Diagnosis not present

## 2018-01-08 DIAGNOSIS — I5032 Chronic diastolic (congestive) heart failure: Secondary | ICD-10-CM | POA: Diagnosis not present

## 2018-01-08 DIAGNOSIS — J449 Chronic obstructive pulmonary disease, unspecified: Secondary | ICD-10-CM | POA: Diagnosis not present

## 2018-01-08 DIAGNOSIS — M545 Low back pain: Secondary | ICD-10-CM | POA: Diagnosis not present

## 2018-01-22 DIAGNOSIS — H40013 Open angle with borderline findings, low risk, bilateral: Secondary | ICD-10-CM | POA: Diagnosis not present

## 2018-01-22 DIAGNOSIS — H3554 Dystrophies primarily involving the retinal pigment epithelium: Secondary | ICD-10-CM | POA: Diagnosis not present

## 2018-01-22 DIAGNOSIS — H353132 Nonexudative age-related macular degeneration, bilateral, intermediate dry stage: Secondary | ICD-10-CM | POA: Diagnosis not present

## 2018-01-22 DIAGNOSIS — H35033 Hypertensive retinopathy, bilateral: Secondary | ICD-10-CM | POA: Diagnosis not present

## 2018-02-17 ENCOUNTER — Ambulatory Visit (INDEPENDENT_AMBULATORY_CARE_PROVIDER_SITE_OTHER): Payer: Medicare HMO | Admitting: *Deleted

## 2018-02-17 DIAGNOSIS — Z7901 Long term (current) use of anticoagulants: Secondary | ICD-10-CM

## 2018-02-17 DIAGNOSIS — I4891 Unspecified atrial fibrillation: Secondary | ICD-10-CM

## 2018-02-17 LAB — POCT INR: INR: 2.5

## 2018-02-17 NOTE — Patient Instructions (Signed)
Continue coumadin 1 1/2 tablets daily except 1 tablet on Tuesdays Recheck in 6 weeks

## 2018-03-05 ENCOUNTER — Encounter: Payer: Self-pay | Admitting: Cardiology

## 2018-03-05 ENCOUNTER — Ambulatory Visit: Payer: Medicare Other | Admitting: Cardiology

## 2018-03-05 ENCOUNTER — Encounter: Payer: Self-pay | Admitting: *Deleted

## 2018-03-05 VITALS — BP 158/78 | HR 60 | Ht 60.5 in | Wt 151.0 lb

## 2018-03-05 DIAGNOSIS — I10 Essential (primary) hypertension: Secondary | ICD-10-CM | POA: Diagnosis not present

## 2018-03-05 DIAGNOSIS — I4891 Unspecified atrial fibrillation: Secondary | ICD-10-CM | POA: Diagnosis not present

## 2018-03-05 DIAGNOSIS — I495 Sick sinus syndrome: Secondary | ICD-10-CM | POA: Diagnosis not present

## 2018-03-05 NOTE — Progress Notes (Signed)
Clinical Summary Ms. Wethington is a 84 y.o.female  seen today for follow up of the following medical problems.    1. Afib  - no recent palpitations - INRs have been at goal. No bleeding on coumadin   2. 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   3. 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 Arthritis   . Atrial fibrillation (HCC)   . COPD (chronic obstructive pulmonary disease) (HCC)   . Hip pain, left   . Hypertension   . Sinoatrial node dysfunction (HCC)      No Known Allergies   Current Outpatient Medications  Medication Sig Dispense Refill  . albuterol (PROVENTIL HFA;VENTOLIN HFA) 108 (90 Base) MCG/ACT inhaler Inhale 2 puffs into the lungs every 6 (six) hours as needed for wheezing or shortness of breath. 1 Inhaler 0  . furosemide (LASIX) 40 MG tablet Take 40 mg by mouth daily.     Marland Kitchen. lisinopril (PRINIVIL,ZESTRIL) 40 MG tablet TAKE 1 TABLET BY MOUTH EVERY DAY 90 tablet 3  . metoprolol tartrate (LOPRESSOR) 25 MG tablet Take 1 tablet (25 mg total) by mouth 2 (two) times daily. 180 tablet 3  . Multiple Vitamin (MULTIVITAMIN) tablet Take 1 tablet by mouth daily.    Marland Kitchen. warfarin (COUMADIN) 4 MG tablet Take 6 mg every day, except Tuesdays when you should take 4 mg. 135 tablet 3   No current facility-administered medications for this visit.      Past Surgical History:  Procedure Laterality Date  . BACK SURGERY    . EYE SURGERY Bilateral    cataract removal  . hip arthoplasty     total  . KNEE ARTHROPLASTY    . uterine polyp removal       No Known Allergies    Family History  Problem Relation Age of Onset  . Prostate cancer Father   . Heart failure Father   . Heart disease Father   . Stroke Sister   . Depression Maternal Grandmother      Social History Ms. Uecker reports that  she has never smoked. She has never used smokeless tobacco. Ms. Vilma MeckelBusick reports that she does not drink alcohol.   Review of Systems CONSTITUTIONAL: No weight loss, fever, chills, weakness or fatigue.  HEENT: Eyes: No visual loss, blurred vision, double vision or yellow sclerae.No hearing loss, sneezing, congestion, runny nose or sore throat.  SKIN: No rash or itching.  CARDIOVASCULAR: per hpi RESPIRATORY: No shortness of breath, cough or sputum.  GASTROINTESTINAL: No anorexia, nausea, vomiting or diarrhea. No abdominal pain or blood.  GENITOURINARY: No burning on urination, no polyuria NEUROLOGICAL: No headache, dizziness, syncope, paralysis, ataxia, numbness or tingling in the extremities. No change in bowel or bladder control.  MUSCULOSKELETAL: No muscle, back pain, joint pain or stiffness.  LYMPHATICS: No enlarged nodes. No history of splenectomy.  PSYCHIATRIC: No history of depression or anxiety.  ENDOCRINOLOGIC: No reports of sweating, cold or heat intolerance. No polyuria or polydipsia.  Marland Kitchen.   Physical Examination Vitals:   03/05/18 1257  BP: (!) 158/78  Pulse: 60  SpO2: 96%   Vitals:   03/05/18 1257  Weight: 151 lb (68.5 kg)  Height: 5' 0.5" (1.537 m)    Gen: resting comfortably, no acute distress HEENT: no scleral icterus, pupils  equal round and reactive, no palptable cervical adenopathy,  CV: RRR, no m/r/g, no jvd Resp: Clear to auscultation bilaterally GI: abdomen is soft, non-tender, non-distended, normal bowel sounds, no hepatosplenomegaly MSK: extremities are warm, no edema.  Skin: warm, no rash Neuro:  no focal deficits Psych: appropriate affect   Diagnostic Studies     Assessment and Plan  1. Afib  - continue current meds including anticoauglation, CHADS2Vasc score is 4  - no symptoms, she will continue current meds  2. Tachy-brady syndrome  - continue to follow in device clinic. NO symptoms. EKG in clinic today shows venticular pacing.   3.  HTN  - accepting higher bp's for her due to prior issues with falls, continue current meds   F/u 6 months.       Antoine Poche, M.D.

## 2018-03-05 NOTE — Patient Instructions (Signed)
Medication Instructions:  Your physician recommends that you continue on your current medications as directed. Please refer to the Current Medication list given to you today.   Labwork: NONE   Testing/Procedures: NONE  Follow-Up: Your physician wants you to follow-up in: 6 Months with Dr. Branch. You will receive a reminder letter in the mail two months in advance. If you don't receive a letter, please call our office to schedule the follow-up appointment.   Any Other Special Instructions Will Be Listed Below (If Applicable).     If you need a refill on your cardiac medications before your next appointment, please call your pharmacy. Thank you for choosing [facility]!    

## 2018-03-11 ENCOUNTER — Encounter: Payer: Self-pay | Admitting: Cardiology

## 2018-04-02 ENCOUNTER — Ambulatory Visit (INDEPENDENT_AMBULATORY_CARE_PROVIDER_SITE_OTHER): Payer: Medicare HMO | Admitting: *Deleted

## 2018-04-02 DIAGNOSIS — Z7901 Long term (current) use of anticoagulants: Secondary | ICD-10-CM

## 2018-04-02 DIAGNOSIS — I4891 Unspecified atrial fibrillation: Secondary | ICD-10-CM

## 2018-04-02 DIAGNOSIS — Z5181 Encounter for therapeutic drug level monitoring: Secondary | ICD-10-CM | POA: Diagnosis not present

## 2018-04-02 LAB — POCT INR: INR: 2 (ref 2.0–3.0)

## 2018-04-02 NOTE — Patient Instructions (Signed)
Continue coumadin 1 1/2 tablets daily except 1 tablet on Tuesdays Recheck in 6 weeks

## 2018-04-10 DIAGNOSIS — Z0000 Encounter for general adult medical examination without abnormal findings: Secondary | ICD-10-CM | POA: Diagnosis not present

## 2018-04-10 DIAGNOSIS — Z23 Encounter for immunization: Secondary | ICD-10-CM | POA: Diagnosis not present

## 2018-04-10 DIAGNOSIS — I110 Hypertensive heart disease with heart failure: Secondary | ICD-10-CM | POA: Diagnosis not present

## 2018-04-10 DIAGNOSIS — I5032 Chronic diastolic (congestive) heart failure: Secondary | ICD-10-CM | POA: Diagnosis not present

## 2018-04-11 ENCOUNTER — Ambulatory Visit: Payer: Medicare Other | Admitting: Internal Medicine

## 2018-04-11 ENCOUNTER — Encounter: Payer: Self-pay | Admitting: Internal Medicine

## 2018-04-11 VITALS — BP 188/84 | HR 78 | Ht 65.0 in | Wt 150.8 lb

## 2018-04-11 DIAGNOSIS — I482 Chronic atrial fibrillation, unspecified: Secondary | ICD-10-CM

## 2018-04-11 DIAGNOSIS — Z950 Presence of cardiac pacemaker: Secondary | ICD-10-CM | POA: Diagnosis not present

## 2018-04-11 NOTE — Patient Instructions (Signed)
Medication Instructions:  Your physician recommends that you continue on your current medications as directed. Please refer to the Current Medication list given to you today.   Labwork: NONE   Testing/Procedures: NONE   Follow-Up: Your physician wants you to follow-up in: 1 Year with Dr. Taylor. You will receive a reminder letter in the mail two months in advance. If you don't receive a letter, please call our office to schedule the follow-up appointment.   Any Other Special Instructions Will Be Listed Below (If Applicable).     If you need a refill on your cardiac medications before your next appointment, please call your pharmacy.  Thank you for choosing [facility]!   

## 2018-04-11 NOTE — Progress Notes (Signed)
HPI [name] returns today for followup. She is a very pleasant 84 year old woman with symptomatic tachycardia bradycardia syndrome, atrial fibrillation, hypertension, on chronic Coumadin therapy. She has severe arthritis and uses a walker now for ambulation. She denies palpitations. No syncope. She still has chronic back pain. No sob or chest pain. Minimal edema She is approaching ERI. No change since I saw her last.  No Known Allergies   Current Outpatient Medications  Medication Sig Dispense Refill  . albuterol (PROVENTIL HFA;VENTOLIN HFA) 108 (90 Base) MCG/ACT inhaler Inhale 2 puffs into the lungs every 6 (six) hours as needed for wheezing or shortness of breath. 1 Inhaler 0  . furosemide (LASIX) 40 MG tablet Take 40 mg by mouth daily.     Marland Kitchen lisinopril (PRINIVIL,ZESTRIL) 40 MG tablet TAKE 1 TABLET BY MOUTH EVERY DAY 90 tablet 3  . metoprolol tartrate (LOPRESSOR) 25 MG tablet Take 1 tablet (25 mg total) by mouth 2 (two) times daily. 180 tablet 3  . Multiple Vitamin (MULTIVITAMIN) tablet Take 1 tablet by mouth daily.    Marland Kitchen warfarin (COUMADIN) 4 MG tablet Take 6 mg every day, except Tuesdays when you should take 4 mg. 135 tablet 3   No current facility-administered medications for this visit.      Past Medical History:  Diagnosis Date  . Arthritis   . Atrial fibrillation (HCC)   . COPD (chronic obstructive pulmonary disease) (HCC)   . Hip pain, left   . Hypertension   . Sinoatrial node dysfunction (HCC)     ROS:   All systems reviewed and negative except as noted in the HPI.   Past Surgical History:  Procedure Laterality Date  . BACK SURGERY    . EYE SURGERY Bilateral    cataract removal  . hip arthoplasty     total  . KNEE ARTHROPLASTY    . uterine polyp removal       Family History  Problem Relation Age of Onset  . Prostate cancer Father   . Heart failure Father   . Heart disease Father   . Stroke Sister   . Depression Maternal Grandmother       Social History   Socioeconomic History  . Marital status: Widowed    Spouse name: Not on file  . Number of children: Not on file  . Years of education: Not on file  . Highest education level: Not on file  Occupational History  . Occupation: Retired    Associate Professor: RETIRED  Social Needs  . Financial resource strain: Not on file  . Food insecurity:    Worry: Not on file    Inability: Not on file  . Transportation needs:    Medical: Not on file    Non-medical: Not on file  Tobacco Use  . Smoking status: Never Smoker  . Smokeless tobacco: Never Used  Substance and Sexual Activity  . Alcohol use: No    Alcohol/week: 0.0 oz  . Drug use: No  . Sexual activity: Not Currently    Birth control/protection: Post-menopausal, None  Lifestyle  . Physical activity:    Days per week: Not on file    Minutes per session: Not on file  . Stress: Not on file  Relationships  . Social connections:    Talks on phone: Not on file    Gets together: Not on file    Attends religious service: Not on file    Active member of club or organization: Not on file  Attends meetings of clubs or organizations: Not on file    Relationship status: Not on file  . Intimate partner violence:    Fear of current or ex partner: Not on file    Emotionally abused: Not on file    Physically abused: Not on file    Forced sexual activity: Not on file  Other Topics Concern  . Not on file  Social History Narrative  . Not on file     BP (!) 188/84   Pulse 78   Ht  (1.651 m)   Wt 150 lb 12.8 oz (68.4 kg)   SpO2 90% Comment: on room air  BMI 25.09 kg/m   Physical Exam:  Well appearing elderly woman, NAD HEENT: Unremarkable Neck:  6 cm JVD, no thyromegally Lymphatics:  No adenopathy Back:  No CVA tenderness Lungs:  Clear with no wheezes HEART:  Regular rate rhythm, no murmurs, no rubs, no clicks Abd:  soft, positive bowel sounds, no organomegally, no rebound, no guarding Ext:  2 plus pulses,  no edema, no cyanosis, no clubbing Skin:  No rashes no nodules Neuro:  CN II through XII intact, motor grossly intact   DEVICE  Normal device function.  See PaceArt for details.   Assess/Plan: 1. Atrial fib - her ventricular rates are well controlled. We will follow 2. CHB - she has an escape in the 30's today.  3. PPM - she is approaching ERI. I suspect she will need a PPM in 5-6 months.  Leonia Reeves.D.

## 2018-04-16 DIAGNOSIS — N3281 Overactive bladder: Secondary | ICD-10-CM | POA: Diagnosis not present

## 2018-04-16 DIAGNOSIS — I4891 Unspecified atrial fibrillation: Secondary | ICD-10-CM | POA: Diagnosis not present

## 2018-04-16 DIAGNOSIS — M792 Neuralgia and neuritis, unspecified: Secondary | ICD-10-CM | POA: Diagnosis not present

## 2018-04-16 DIAGNOSIS — I5032 Chronic diastolic (congestive) heart failure: Secondary | ICD-10-CM | POA: Diagnosis not present

## 2018-04-16 DIAGNOSIS — Z0000 Encounter for general adult medical examination without abnormal findings: Secondary | ICD-10-CM | POA: Diagnosis not present

## 2018-04-16 DIAGNOSIS — M545 Low back pain: Secondary | ICD-10-CM | POA: Diagnosis not present

## 2018-04-16 DIAGNOSIS — I10 Essential (primary) hypertension: Secondary | ICD-10-CM | POA: Diagnosis not present

## 2018-04-16 DIAGNOSIS — M159 Polyosteoarthritis, unspecified: Secondary | ICD-10-CM | POA: Diagnosis not present

## 2018-05-05 DIAGNOSIS — H35363 Drusen (degenerative) of macula, bilateral: Secondary | ICD-10-CM | POA: Diagnosis not present

## 2018-05-05 DIAGNOSIS — H35371 Puckering of macula, right eye: Secondary | ICD-10-CM | POA: Diagnosis not present

## 2018-05-05 DIAGNOSIS — H353114 Nonexudative age-related macular degeneration, right eye, advanced atrophic with subfoveal involvement: Secondary | ICD-10-CM | POA: Diagnosis not present

## 2018-05-05 DIAGNOSIS — H353123 Nonexudative age-related macular degeneration, left eye, advanced atrophic without subfoveal involvement: Secondary | ICD-10-CM | POA: Diagnosis not present

## 2018-05-07 LAB — CUP PACEART INCLINIC DEVICE CHECK
Implantable Lead Location: 753860
Implantable Pulse Generator Implant Date: 20070518
MDC IDC LEAD IMPLANT DT: 20070518
MDC IDC LEAD IMPLANT DT: 20070518
MDC IDC LEAD LOCATION: 753859
MDC IDC SESS DTM: 20190626100802
Pulse Gen Serial Number: 1683162

## 2018-05-12 ENCOUNTER — Ambulatory Visit (INDEPENDENT_AMBULATORY_CARE_PROVIDER_SITE_OTHER): Payer: Medicare HMO | Admitting: *Deleted

## 2018-05-12 DIAGNOSIS — Z7901 Long term (current) use of anticoagulants: Secondary | ICD-10-CM | POA: Diagnosis not present

## 2018-05-12 DIAGNOSIS — I4891 Unspecified atrial fibrillation: Secondary | ICD-10-CM

## 2018-05-12 LAB — POCT INR: INR: 1.9 — AB (ref 2.0–3.0)

## 2018-05-12 NOTE — Patient Instructions (Signed)
Increase coumadin 1 1/2 tablets daily  Recheck in 6 weeks

## 2018-06-23 ENCOUNTER — Ambulatory Visit (INDEPENDENT_AMBULATORY_CARE_PROVIDER_SITE_OTHER): Payer: Medicare HMO | Admitting: *Deleted

## 2018-06-23 DIAGNOSIS — Z7901 Long term (current) use of anticoagulants: Secondary | ICD-10-CM | POA: Diagnosis not present

## 2018-06-23 DIAGNOSIS — I4891 Unspecified atrial fibrillation: Secondary | ICD-10-CM | POA: Diagnosis not present

## 2018-06-23 LAB — POCT INR: INR: 1.8 — AB (ref 2.0–3.0)

## 2018-06-23 NOTE — Patient Instructions (Signed)
Take coumadin 2 tablets tonight then increase dose to 1 1/2 tablets daily (did not increase dose last visit) Recheck in 3 weeks

## 2018-07-28 ENCOUNTER — Ambulatory Visit (INDEPENDENT_AMBULATORY_CARE_PROVIDER_SITE_OTHER): Payer: Medicare HMO | Admitting: *Deleted

## 2018-07-28 DIAGNOSIS — Z7901 Long term (current) use of anticoagulants: Secondary | ICD-10-CM

## 2018-07-28 DIAGNOSIS — I4891 Unspecified atrial fibrillation: Secondary | ICD-10-CM | POA: Diagnosis not present

## 2018-07-28 LAB — POCT INR: INR: 2.8 (ref 2.0–3.0)

## 2018-07-28 NOTE — Patient Instructions (Signed)
Continue coumadin 1 1/2 tablets daily Recheck in 4 weeks . 

## 2018-07-30 DIAGNOSIS — H02132 Senile ectropion of right lower eyelid: Secondary | ICD-10-CM | POA: Diagnosis not present

## 2018-07-30 DIAGNOSIS — H40013 Open angle with borderline findings, low risk, bilateral: Secondary | ICD-10-CM | POA: Diagnosis not present

## 2018-08-06 ENCOUNTER — Encounter: Payer: Self-pay | Admitting: *Deleted

## 2018-08-06 ENCOUNTER — Encounter: Payer: Self-pay | Admitting: Internal Medicine

## 2018-08-06 ENCOUNTER — Ambulatory Visit (INDEPENDENT_AMBULATORY_CARE_PROVIDER_SITE_OTHER): Payer: Medicare HMO | Admitting: Internal Medicine

## 2018-08-06 ENCOUNTER — Other Ambulatory Visit (HOSPITAL_COMMUNITY)
Admission: RE | Admit: 2018-08-06 | Discharge: 2018-08-06 | Disposition: A | Discharge status: Home / Self Care | Payer: Medicare HMO | Source: Ambulatory Visit | Attending: Internal Medicine | Admitting: Internal Medicine

## 2018-08-06 DIAGNOSIS — I480 Paroxysmal atrial fibrillation: Secondary | ICD-10-CM | POA: Insufficient documentation

## 2018-08-06 LAB — CBC WITH DIFFERENTIAL/PLATELET
Basophils Absolute: 0 10*3/uL (ref 0.0–0.1)
Basophils Relative: 0 %
EOS ABS: 0.3 10*3/uL (ref 0.0–0.7)
Eosinophils Relative: 4 %
HEMATOCRIT: 35.4 % — AB (ref 36.0–46.0)
HEMOGLOBIN: 10.9 g/dL — AB (ref 12.0–15.0)
LYMPHS PCT: 24 %
Lymphs Abs: 1.8 10*3/uL (ref 0.7–4.0)
MCH: 30.7 pg (ref 26.0–34.0)
MCHC: 30.8 g/dL (ref 30.0–36.0)
MCV: 99.7 fL (ref 78.0–100.0)
MONOS PCT: 12 %
Monocytes Absolute: 0.9 10*3/uL (ref 0.1–1.0)
NEUTROS ABS: 4.2 10*3/uL (ref 1.7–7.7)
NEUTROS PCT: 60 %
Platelets: 257 10*3/uL (ref 150–400)
RBC: 3.55 MIL/uL — AB (ref 3.87–5.11)
RDW: 16.8 % — ABNORMAL HIGH (ref 11.5–15.5)
WBC: 7.2 10*3/uL (ref 4.0–10.5)

## 2018-08-06 LAB — BASIC METABOLIC PANEL
Anion gap: 5 (ref 5–15)
BUN: 19 mg/dL (ref 8–23)
CHLORIDE: 105 mmol/L (ref 98–111)
CO2: 29 mmol/L (ref 22–32)
CREATININE: 0.54 mg/dL (ref 0.44–1.00)
Calcium: 8.8 mg/dL — ABNORMAL LOW (ref 8.9–10.3)
GFR calc non Af Amer: >60 mL/min (ref >60)
Glucose, Bld: 93 mg/dL (ref 70–99)
POTASSIUM: 4.6 mmol/L (ref 3.5–5.1)
SODIUM: 139 mmol/L (ref 135–145)

## 2018-08-06 NOTE — Patient Instructions (Signed)
Medication Instructions:  Your physician recommends that you continue on your current medications as directed. Please refer to the Current Medication list given to you today.   Labwork: Your physician recommends that you return for lab work today.   Testing/Procedures: Your physician has recommended that you have a pacemaker inserted. A pacemaker is a small device that is placed under the skin of your chest or abdomen to help control abnormal heart rhythms. This device uses electrical pulses to prompt the heart to beat at a normal rate. Pacemakers are used to treat heart rhythms that are too slow. Wire (leads) are attached to the pacemaker that goes into the chambers of you heart. This is done in the hospital and usually requires and overnight stay. Please see the instruction sheet given to you today for more information.    Follow-Up: Your physician recommends that you schedule a follow-up appointment in: 14 days.    Any Other Special Instructions Will Be Listed Below (If Applicable).     If you need a refill on your cardiac medications before your next appointment, please call your pharmacy.  Thank you for choosing [facility]!

## 2018-08-06 NOTE — H&P (View-Only) (Signed)
    HPI Mrs. Umland returns today for followup. She is a 84 yo woman with a h/o CHB and atrial fib, s/p PPM insertion who has reached ERI on her device. She notes that her energy level is down a bit. No chest pain or sob.  No Known Allergies   Current Outpatient Medications  Medication Sig Dispense Refill  . albuterol (PROVENTIL HFA;VENTOLIN HFA) 108 (90 Base) MCG/ACT inhaler Inhale 2 puffs into the lungs every 6 (six) hours as needed for wheezing or shortness of breath. 1 Inhaler 0  . furosemide (LASIX) 40 MG tablet Take 40 mg by mouth daily.     . lisinopril (PRINIVIL,ZESTRIL) 40 MG tablet TAKE 1 TABLET BY MOUTH EVERY DAY 90 tablet 3  . metoprolol tartrate (LOPRESSOR) 25 MG tablet Take 1 tablet (25 mg total) by mouth 2 (two) times daily. 180 tablet 3  . Multiple Vitamin (MULTIVITAMIN) tablet Take 1 tablet by mouth daily.    . warfarin (COUMADIN) 4 MG tablet Take 6 mg every day, except Tuesdays when you should take 4 mg. 135 tablet 3   No current facility-administered medications for this visit.      Past Medical History:  Diagnosis Date  . Arthritis   . Atrial fibrillation (HCC)   . COPD (chronic obstructive pulmonary disease) (HCC)   . Hip pain, left   . Hypertension   . Sinoatrial node dysfunction (HCC)     ROS:   All systems reviewed and negative except as noted in the HPI.   Past Surgical History:  Procedure Laterality Date  . BACK SURGERY    . EYE SURGERY Bilateral    cataract removal  . hip arthoplasty     total  . KNEE ARTHROPLASTY    . uterine polyp removal       Family History  Problem Relation Age of Onset  . Prostate cancer Father   . Heart failure Father   . Heart disease Father   . Stroke Sister   . Depression Maternal Grandmother      Social History   Socioeconomic History  . Marital status: Widowed    Spouse name: Not on file  . Number of children: Not on file  . Years of education: Not on file  . Highest education level: Not on  file  Occupational History  . Occupation: Retired    Employer: RETIRED  Social Needs  . Financial resource strain: Not on file  . Food insecurity:    Worry: Not on file    Inability: Not on file  . Transportation needs:    Medical: Not on file    Non-medical: Not on file  Tobacco Use  . Smoking status: Never Smoker  . Smokeless tobacco: Never Used  Substance and Sexual Activity  . Alcohol use: No    Alcohol/week: 0.0 standard drinks  . Drug use: No  . Sexual activity: Not Currently    Birth control/protection: Post-menopausal, None  Lifestyle  . Physical activity:    Days per week: Not on file    Minutes per session: Not on file  . Stress: Not on file  Relationships  . Social connections:    Talks on phone: Not on file    Gets together: Not on file    Attends religious service: Not on file    Active member of club or organization: Not on file    Attends meetings of clubs or organizations: Not on file    Relationship status: Not on file  .   Intimate partner violence:    Fear of current or ex partner: Not on file    Emotionally abused: Not on file    Physically abused: Not on file    Forced sexual activity: Not on file  Other Topics Concern  . Not on file  Social History Narrative  . Not on file     BP - 160/80, P - 60, R - 16  Physical Exam:  Well appearing elderly woman, NAD HEENT: Unremarkable Neck:  7 cm  JVD, no thyromegally Lymphatics:  No adenopathy Back:  No CVA tenderness Lungs:  Clearwith no wheezes HEART:  Regular rate rhythm, no murmurs, no rubs, no clicks Abd:  soft, positive bowel sounds, no organomegally, no rebound, no guarding Ext:  2 plus pulses, no edema, no cyanosis, no clubbing Skin:  No rashes no nodules Neuro:  CN II through XII intact, motor grossly intact   DEVICE  Normal device function.  See PaceArt for details. She is at ERI since June.  Assess/Plan: 1. Chronic atrial fib - her rate is controlled. She will continue her  current meds. 2. PPM - she has been at ERI for 3 months. She will undergo PPM gen change next week. Voltage is 2.6. Her St. Jude device is otherwise working normally. 3. HTN - her blood pressure remains a little high. She admits to some sodium indiscretion.  

## 2018-08-06 NOTE — Progress Notes (Signed)
HPI [name] returns today for followup. She is a 84 yo woman with a h/o CHB and atrial fib, s/p PPM insertion who has reached ERI on her device. She notes that her energy level is down a bit. No chest pain or sob.  No Known Allergies   Current Outpatient Medications  Medication Sig Dispense Refill  . albuterol (PROVENTIL HFA;VENTOLIN HFA) 108 (90 Base) MCG/ACT inhaler Inhale 2 puffs into the lungs every 6 (six) hours as needed for wheezing or shortness of breath. 1 Inhaler 0  . furosemide (LASIX) 40 MG tablet Take 40 mg by mouth daily.     Marland Kitchen lisinopril (PRINIVIL,ZESTRIL) 40 MG tablet TAKE 1 TABLET BY MOUTH EVERY DAY 90 tablet 3  . metoprolol tartrate (LOPRESSOR) 25 MG tablet Take 1 tablet (25 mg total) by mouth 2 (two) times daily. 180 tablet 3  . Multiple Vitamin (MULTIVITAMIN) tablet Take 1 tablet by mouth daily.    Marland Kitchen warfarin (COUMADIN) 4 MG tablet Take 6 mg every day, except Tuesdays when you should take 4 mg. 135 tablet 3   No current facility-administered medications for this visit.      Past Medical History:  Diagnosis Date  . Arthritis   . Atrial fibrillation (HCC)   . COPD (chronic obstructive pulmonary disease) (HCC)   . Hip pain, left   . Hypertension   . Sinoatrial node dysfunction (HCC)     ROS:   All systems reviewed and negative except as noted in the HPI.   Past Surgical History:  Procedure Laterality Date  . BACK SURGERY    . EYE SURGERY Bilateral    cataract removal  . hip arthoplasty     total  . KNEE ARTHROPLASTY    . uterine polyp removal       Family History  Problem Relation Age of Onset  . Prostate cancer Father   . Heart failure Father   . Heart disease Father   . Stroke Sister   . Depression Maternal Grandmother      Social History   Socioeconomic History  . Marital status: Widowed    Spouse name: Not on file  . Number of children: Not on file  . Years of education: Not on file  . Highest education level: Not on  file  Occupational History  . Occupation: Retired    Associate Professor: RETIRED  Social Needs  . Financial resource strain: Not on file  . Food insecurity:    Worry: Not on file    Inability: Not on file  . Transportation needs:    Medical: Not on file    Non-medical: Not on file  Tobacco Use  . Smoking status: Never Smoker  . Smokeless tobacco: Never Used  Substance and Sexual Activity  . Alcohol use: No    Alcohol/week: 0.0 standard drinks  . Drug use: No  . Sexual activity: Not Currently    Birth control/protection: Post-menopausal, None  Lifestyle  . Physical activity:    Days per week: Not on file    Minutes per session: Not on file  . Stress: Not on file  Relationships  . Social connections:    Talks on phone: Not on file    Gets together: Not on file    Attends religious service: Not on file    Active member of club or organization: Not on file    Attends meetings of clubs or organizations: Not on file    Relationship status: Not on file  .  Intimate partner violence:    Fear of current or ex partner: Not on file    Emotionally abused: Not on file    Physically abused: Not on file    Forced sexual activity: Not on file  Other Topics Concern  . Not on file  Social History Narrative  . Not on file     BP - 160/80, P - 60, R - 16  Physical Exam:  Well appearing elderly woman, NAD HEENT: Unremarkable Neck:  7 cm  JVD, no thyromegally Lymphatics:  No adenopathy Back:  No CVA tenderness Lungs:  Clearwith no wheezes HEART:  Regular rate rhythm, no murmurs, no rubs, no clicks Abd:  soft, positive bowel sounds, no organomegally, no rebound, no guarding Ext:  2 plus pulses, no edema, no cyanosis, no clubbing Skin:  No rashes no nodules Neuro:  CN II through XII intact, motor grossly intact   DEVICE  Normal device function.  See PaceArt for details. She is at [facility] since June.  Assess/Plan: 1. Chronic atrial fib - her rate is controlled. She will continue her  current meds. 2. PPM - she has been at [facility] for 3 months. She will undergo PPM gen change next week. Voltage is 2.6. Her [employer] device is otherwise working normally. 3. HTN - her blood pressure remains a little high. She admits to some sodium indiscretion.

## 2018-08-14 ENCOUNTER — Other Ambulatory Visit: Payer: Self-pay

## 2018-08-14 ENCOUNTER — Ambulatory Visit (HOSPITAL_COMMUNITY)
Admission: RE | Admit: 2018-08-14 | Discharge: 2018-08-14 | Disposition: A | Discharge status: Home / Self Care | Payer: Medicare HMO | Source: Ambulatory Visit | Attending: Internal Medicine | Admitting: Internal Medicine

## 2018-08-14 ENCOUNTER — Encounter (HOSPITAL_COMMUNITY)
Admission: RE | Disposition: A | Discharge status: Home / Self Care | Payer: Self-pay | Source: Ambulatory Visit | Attending: Internal Medicine

## 2018-08-14 DIAGNOSIS — Z7901 Long term (current) use of anticoagulants: Secondary | ICD-10-CM | POA: Insufficient documentation

## 2018-08-14 DIAGNOSIS — Z8249 Family history of ischemic heart disease and other diseases of the circulatory system: Secondary | ICD-10-CM | POA: Insufficient documentation

## 2018-08-14 DIAGNOSIS — M1990 Unspecified osteoarthritis, unspecified site: Secondary | ICD-10-CM | POA: Insufficient documentation

## 2018-08-14 DIAGNOSIS — J449 Chronic obstructive pulmonary disease, unspecified: Secondary | ICD-10-CM | POA: Insufficient documentation

## 2018-08-14 DIAGNOSIS — Z9889 Other specified postprocedural states: Secondary | ICD-10-CM | POA: Insufficient documentation

## 2018-08-14 DIAGNOSIS — Z823 Family history of stroke: Secondary | ICD-10-CM | POA: Insufficient documentation

## 2018-08-14 DIAGNOSIS — Z96649 Presence of unspecified artificial hip joint: Secondary | ICD-10-CM | POA: Insufficient documentation

## 2018-08-14 DIAGNOSIS — Z950 Presence of cardiac pacemaker: Secondary | ICD-10-CM | POA: Diagnosis present

## 2018-08-14 DIAGNOSIS — I10 Essential (primary) hypertension: Secondary | ICD-10-CM | POA: Diagnosis not present

## 2018-08-14 DIAGNOSIS — I442 Atrioventricular block, complete: Secondary | ICD-10-CM | POA: Insufficient documentation

## 2018-08-14 DIAGNOSIS — I482 Chronic atrial fibrillation, unspecified: Secondary | ICD-10-CM | POA: Insufficient documentation

## 2018-08-14 DIAGNOSIS — I495 Sick sinus syndrome: Secondary | ICD-10-CM | POA: Insufficient documentation

## 2018-08-14 DIAGNOSIS — Z79899 Other long term (current) drug therapy: Secondary | ICD-10-CM | POA: Insufficient documentation

## 2018-08-14 DIAGNOSIS — Z45010 Encounter for checking and testing of cardiac pacemaker pulse generator [battery]: Secondary | ICD-10-CM | POA: Diagnosis not present

## 2018-08-14 HISTORY — PX: PPM GENERATOR CHANGEOUT: EP1233

## 2018-08-14 LAB — SURGICAL PCR SCREEN
MRSA, PCR: NEGATIVE
STAPHYLOCOCCUS AUREUS: NEGATIVE

## 2018-08-14 LAB — PROTIME-INR
INR: 1.61
Prothrombin Time: 19 seconds — ABNORMAL HIGH (ref 11.4–15.2)

## 2018-08-14 SURGERY — PPM GENERATOR CHANGEOUT

## 2018-08-14 MED ORDER — ACETAMINOPHEN 325 MG PO TABS
325.0000 mg | ORAL_TABLET | ORAL | Status: DC | PRN
  Filled 2018-08-14: qty 2

## 2018-08-14 MED ORDER — MUPIROCIN 2 % EX OINT
TOPICAL_OINTMENT | CUTANEOUS | Status: AC
  Administered 2018-08-14: 1
  Filled 2018-08-14: qty 22

## 2018-08-14 MED ORDER — CEFAZOLIN SODIUM-DEXTROSE 2-4 GM/100ML-% IV SOLN
INTRAVENOUS | Status: AC
  Filled 2018-08-14: qty 100

## 2018-08-14 MED ORDER — LIDOCAINE HCL (PF) 1 % IJ SOLN
INTRAMUSCULAR | Status: DC | PRN
  Administered 2018-08-14: 45 mL

## 2018-08-14 MED ORDER — SODIUM CHLORIDE 0.9 % IV SOLN
INTRAVENOUS | Status: AC
  Filled 2018-08-14: qty 2

## 2018-08-14 MED ORDER — METOPROLOL TARTRATE 5 MG/5ML IV SOLN
5.0000 mg | Freq: Once | INTRAVENOUS | Status: DC
  Filled 2018-08-14: qty 5

## 2018-08-14 MED ORDER — CEFAZOLIN SODIUM-DEXTROSE 2-4 GM/100ML-% IV SOLN
2.0000 g | INTRAVENOUS | Status: AC
  Administered 2018-08-14: 2 g via INTRAVENOUS

## 2018-08-14 MED ORDER — ONDANSETRON HCL 4 MG/2ML IJ SOLN: 4.0000 mg | Freq: Four times a day (QID) | INTRAMUSCULAR | Status: DC | PRN

## 2018-08-14 MED ORDER — HYDRALAZINE HCL 20 MG/ML IJ SOLN
5.0000 mg | Freq: Once | INTRAMUSCULAR | Status: DC
  Filled 2018-08-14: qty 1

## 2018-08-14 MED ORDER — LIDOCAINE HCL (PF) 1 % IJ SOLN
INTRAMUSCULAR | Status: AC
  Filled 2018-08-14: qty 60

## 2018-08-14 MED ORDER — SODIUM CHLORIDE 0.9 % IV SOLN
80.0000 mg | INTRAVENOUS | Status: AC
  Administered 2018-08-14: 80 mg

## 2018-08-14 MED ORDER — CHLORHEXIDINE GLUCONATE 4 % EX LIQD: 60.0000 mL | Freq: Once | CUTANEOUS | Status: DC

## 2018-08-14 MED ORDER — SODIUM CHLORIDE 0.9 % IV SOLN
INTRAVENOUS | Status: DC
  Administered 2018-08-14: 11:00:00 via INTRAVENOUS

## 2018-08-14 SURGICAL SUPPLY — 4 items
CABLE SURGICAL S-101-97-12 (CABLE) ×3 IMPLANT
PACEMAKER ASSURITY SR-SF (Pacemaker) ×2 IMPLANT
PAD PRO RADIOLUCENT 2001M-C (PAD) ×3 IMPLANT
TRAY PACEMAKER INSERTION (PACKS) ×3 IMPLANT

## 2018-08-14 NOTE — Interval H&P Note (Signed)
History and Physical Interval Note:  08/14/2018 10:07 AM  [name]  has presented today for surgery, with the diagnosis of eol  The various methods of treatment have been discussed with the patient and family. After consideration of risks, benefits and other options for treatment, the patient has consented to  Procedure(s): PPM GENERATOR CHANGEOUT (N/A) as a surgical intervention .  The patient's history has been reviewed, patient examined, no change in status, stable for surgery.  I have reviewed the patient's chart and labs.  Questions were answered to the patient's satisfaction.     Lewayne Bunting

## 2018-08-14 NOTE — Progress Notes (Signed)
Dr Ladona Ridgel was called to clarify when pt to restart coumadin.  Pt is to restart her normal dose tomorrow evening 08-15-2018. Pt and family instructed and verbalize understanding

## 2018-08-14 NOTE — Progress Notes (Signed)
Dr Ladona Ridgel notified of b/p and orders noted

## 2018-08-14 NOTE — Discharge Instructions (Signed)
Post procedure site care instructions Keep incision clean and dry for 10 days. No driving for 2 days.  You can remove outer dressing tomorrow. Leave steri-strips (little pieces of tape) on until seen in the office for wound check appointment. Call the office [phone]) for redness, drainage, swelling, or fever.   Pacemaker Battery Change, Care After This sheet gives you information about how to care for yourself after your procedure. Your health care provider may also give you more specific instructions. If you have problems or questions, contact your health care provider. What can I expect after the procedure? After your procedure, it is common to have:  Pain or soreness at the site where the pacemaker was inserted.  Swelling at the site where the pacemaker was inserted.  Follow these instructions at home: Incision care  Keep the incision clean and dry. ? Do not take baths, swim, or use a hot tub until your health care provider approves. ? You may shower the day after your procedure, or as directed by your health care provider. ? Pat the area dry with a clean towel. Do not rub the area. This may cause bleeding.  Follow instructions from your health care provider about how to take care of your incision. Make sure you: ? Wash your hands with soap and water before you change your bandage (dressing). If soap and water are not available, use hand sanitizer. ? Change your dressing as told by your health care provider. ? Leave stitches (sutures), skin glue, or adhesive strips in place. These skin closures may need to stay in place for 2 weeks or longer. If adhesive strip edges start to loosen and curl up, you may trim the loose edges. Do not remove adhesive strips completely unless your health care provider tells you to do that.  Check your incision area every day for signs of infection. Check for: ? More redness, swelling, or pain. ? More fluid or blood. ? Warmth. ? Pus or a bad  smell. Activity  Do not lift anything that is heavier than 10 lb (4.5 kg) until your health care provider says it is okay to do so.  For the first 2 weeks, or as long as told by your health care provider: ? Avoid lifting your left arm higher than your shoulder. ? Be gentle when you move your arms over your head. It is okay to raise your arm to comb your hair. ? Avoid strenuous exercise.  Ask your health care provider when it is okay to: ? Resume your normal activities. ? Return to work or school. ? Resume sexual activity. Eating and drinking  Eat a heart-healthy diet. This should include plenty of fresh fruits and vegetables, whole grains, low-fat dairy products, and lean protein like chicken and fish.  Limit alcohol intake to no more than 1 drink a day for non-pregnant women and 2 drinks a day for men. One drink equals 12 oz of beer, 5 oz of wine, or 1 oz of hard liquor.  Check ingredients and nutrition facts on packaged foods and beverages. Avoid the following types of food: ? Food that is high in salt (sodium). ? Food that is high in saturated fat, like full-fat dairy or red meat. ? Food that is high in trans fat, like fried food. ? Food and drinks that are high in sugar. Lifestyle  Do not use any products that contain nicotine or tobacco, such as cigarettes and e-cigarettes. If you need help quitting, ask your health care provider.  Take steps to manage and control your weight. °· Get regular exercise. Aim for 150 minutes of moderate-intensity exercise (such as walking or yoga) or 75 minutes of vigorous exercise (such as running or swimming) each week. °· Manage other health problems, such as diabetes or high blood pressure. Ask your health care provider how you can manage these conditions. °General instructions °· Do not drive for 24 hours after your procedure if you were given a medicine to help you relax (sedative). °· Take over-the-counter and prescription medicines only as told  by your health care provider. °· Avoid putting pressure on the area where the pacemaker was placed. °· If you need an MRI after your pacemaker has been placed, be sure to tell the health care provider who orders the MRI that you have a pacemaker. °· Avoid close and prolonged exposure to electrical devices that have strong magnetic fields. These include: °? Cell phones. Avoid keeping them in a pocket near the pacemaker, and try using the ear opposite the pacemaker. °? MP3 players. °? Household appliances, like microwaves. °? Metal detectors. °? Electric generators. °? High-tension wires. °· Keep all follow-up visits as directed by your health care provider. This is important. °Contact a health care provider if: °· You have pain at the incision site that is not relieved by over-the-counter or prescription medicines. °· You have any of these around your incision site or coming from it: °? More redness, swelling, or pain. °? Fluid or blood. °? Warmth to the touch. °? Pus or a bad smell. °· You have a fever. °· You feel brief, occasional palpitations, light-headedness, or any symptoms that you think might be related to your heart. °Get help right away if: °· You experience chest pain that is different from the pain at the pacemaker site. °· You develop a red streak that extends above or below the incision site. °· You experience shortness of breath. °· You have palpitations or an irregular heartbeat. °· You have light-headedness that does not go away quickly. °· You faint or have dizzy spells. °· Your pulse suddenly drops or increases rapidly and does not return to normal. °· You begin to gain weight and your legs and ankles swell. °Summary °· After your procedure, it is common to have pain, soreness, and some swelling where the pacemaker was inserted. °· Make sure to keep your incision clean and dry. Follow instructions from your health care provider about how to take care of your incision. °· Check your incision every  day for signs of infection, such as more pain or swelling, pus or a bad smell, warmth, or leaking fluid and blood. °· Avoid strenuous exercise and lifting your left arm higher than your shoulder for 2 weeks, or as long as told by your health care provider. °This information is not intended to replace advice given to you by your health care provider. Make sure you discuss any questions you have with your health care provider. °Document Released: 08/19/2013 Document Revised: 09/20/2016 Document Reviewed: 09/20/2016 °Elsevier Interactive Patient Education © 2017 Elsevier Inc. ° °

## 2018-08-14 NOTE — Progress Notes (Signed)
Dr Ladona Ridgel notified of B/P now and order to cancel B/P meds ordered earlier

## 2018-08-14 NOTE — Progress Notes (Signed)
St Jude rep is tied up in a cae and unable to come and demonstrate pt's bedside monitor.Was instructed by Cyndia Skeeters to Instruct pt and family to bring monitor to wound check appointmet and they would teach her there. Pt and family verbalize understanding

## 2018-08-14 NOTE — Interval H&P Note (Signed)
History and Physical Interval Note:  08/14/2018 10:07 AM  [name]  has presented today for surgery, with the diagnosis of eol  The various methods of treatment have been discussed with the patient and family. After consideration of risks, benefits and other options for treatment, the patient has consented to  Procedure(s): PPM GENERATOR CHANGEOUT (N/A) as a surgical intervention .  The patient's history has been reviewed, patient examined, no change in status, stable for surgery.  I have reviewed the patient's chart and labs.  Questions were answered to the patient's satisfaction.     [name]   

## 2018-08-15 ENCOUNTER — Encounter (HOSPITAL_COMMUNITY): Payer: Self-pay | Admitting: Internal Medicine

## 2018-08-18 ENCOUNTER — Other Ambulatory Visit: Payer: Self-pay | Admitting: Cardiology

## 2018-08-25 ENCOUNTER — Ambulatory Visit (INDEPENDENT_AMBULATORY_CARE_PROVIDER_SITE_OTHER): Payer: Medicare HMO | Admitting: *Deleted

## 2018-08-25 DIAGNOSIS — I4891 Unspecified atrial fibrillation: Secondary | ICD-10-CM | POA: Diagnosis not present

## 2018-08-25 DIAGNOSIS — Z7901 Long term (current) use of anticoagulants: Secondary | ICD-10-CM | POA: Diagnosis not present

## 2018-08-25 LAB — POCT INR: INR: 1.3 — AB (ref 2.0–3.0)

## 2018-08-25 NOTE — Patient Instructions (Signed)
Take 2 tablets x 3 days then resume 1 1/2 tablets daily  Recheck in 2 weeks

## 2018-08-28 ENCOUNTER — Ambulatory Visit (INDEPENDENT_AMBULATORY_CARE_PROVIDER_SITE_OTHER): Payer: Medicare HMO | Admitting: *Deleted

## 2018-08-28 DIAGNOSIS — I4821 Permanent atrial fibrillation: Secondary | ICD-10-CM

## 2018-08-29 LAB — CUP PACEART INCLINIC DEVICE CHECK
Battery Voltage: 3.14 V
Brady Statistic RV Percent Paced: 89 %
Implantable Lead Implant Date: 20070518
Implantable Lead Location: 753860
Lead Channel Impedance Value: 600 Ohm
Lead Channel Pacing Threshold Pulse Width: 0.5 ms
Lead Channel Sensing Intrinsic Amplitude: 12 mV
Lead Channel Setting Pacing Amplitude: 0.875
Lead Channel Setting Pacing Pulse Width: 0.5 ms
MDC IDC LEAD IMPLANT DT: 20070518
MDC IDC LEAD LOCATION: 753859
MDC IDC MSMT BATTERY REMAINING LONGEVITY: 140 mo
MDC IDC MSMT LEADCHNL RV PACING THRESHOLD AMPLITUDE: 0.75 V
MDC IDC MSMT LEADCHNL RV PACING THRESHOLD AMPLITUDE: 0.75 V
MDC IDC MSMT LEADCHNL RV PACING THRESHOLD PULSEWIDTH: 0.5 ms
MDC IDC PG IMPLANT DT: 20191003
MDC IDC PG SERIAL: 9067608
MDC IDC SESS DTM: 20191017152337
MDC IDC SET LEADCHNL RV SENSING SENSITIVITY: 2 mV
Pulse Gen Model: 1272

## 2018-08-29 NOTE — Progress Notes (Signed)
Wound check appointment. Steri-strips removed. Wound without redness or edema. Incision edges approximated, wound well healed. Normal device function. Threshold, sensing, and impedance consistent with implant measurements. Device programmed at appropriate safety margins. Histogram distribution appropriate for patient and level of activity. No high ventricular rates noted. Patient educated about wound care, arm mobility, and remote monitoring. ROV in 3 months with GT/R.

## 2018-09-08 ENCOUNTER — Ambulatory Visit (INDEPENDENT_AMBULATORY_CARE_PROVIDER_SITE_OTHER): Payer: Medicare HMO | Admitting: *Deleted

## 2018-09-08 DIAGNOSIS — Z5181 Encounter for therapeutic drug level monitoring: Secondary | ICD-10-CM

## 2018-09-08 DIAGNOSIS — I4891 Unspecified atrial fibrillation: Secondary | ICD-10-CM

## 2018-09-08 LAB — POCT INR: INR: 2.2 (ref 2.0–3.0)

## 2018-09-08 NOTE — Patient Instructions (Signed)
Continue coumadin 1 1/2 tablets daily Recheck in 4 weeks . 

## 2018-09-22 ENCOUNTER — Encounter: Payer: Self-pay | Admitting: Cardiology

## 2018-09-22 ENCOUNTER — Ambulatory Visit: Payer: Medicare HMO | Admitting: Cardiology

## 2018-09-22 VITALS — BP 144/78 | HR 73 | Ht 65.0 in | Wt 148.0 lb

## 2018-09-22 DIAGNOSIS — I10 Essential (primary) hypertension: Secondary | ICD-10-CM | POA: Diagnosis not present

## 2018-09-22 DIAGNOSIS — I4891 Unspecified atrial fibrillation: Secondary | ICD-10-CM

## 2018-09-22 DIAGNOSIS — I495 Sick sinus syndrome: Secondary | ICD-10-CM

## 2018-09-22 NOTE — Patient Instructions (Signed)
Medication Instructions: Your physician recommends that you continue on your current medications as directed. Please refer to the Current Medication list given to you today.  If you need a refill on your cardiac medications before your next appointment, please call your pharmacy.   Lab work: none If you have labs (blood work) drawn today and your tests are completely normal, you will receive your results only by: Marland Kitchen MyChart Message (if you have MyChart) OR . A paper copy in the mail If you have any lab test that is abnormal or we need to change your treatment, we will call you to review the results.  Testing/Procedures: none  Follow-Up: At [facility], you and your health needs are our priority.  As part of our continuing mission to provide you with exceptional heart care, we have created designated Provider Care Teams.  These Care Teams include your primary Cardiologist (physician) and Advanced Practice Providers (APPs -  Physician Assistants and Nurse Practitioners) who all work together to provide you with the care you need, when you need it. You will need a follow up appointment in 6 months.  Please call our office 2 months in advance to schedule this appointment.  You may see Dina Rich, MD or one of the following Advanced Practice Providers on your designated Care Team:   Randall An, PA-C [facility]) . Jacolyn Reedy, PA-C [facility] Office)  Any Other Special Instructions Will Be Listed Below (If Applicable). None

## 2018-09-22 NOTE — Progress Notes (Signed)
Clinical Summary [name] is a 84 y.o.female seen today for follow up of the following medical problems.    1. Afib - no recent palpitaitons - compliant with meds. No bleeding on coumadin  - has not been interested in NOACs.   2. Tachy-Brady Syndrome  - has permanent pacemaker followed by EP Dr Ladona Ridgel - s/p gen change by Dr Ladona Ridgel 08/2018 - no recent symptoms    3. HTN  -she is compliant with meds. We have been accepting high bp's for her due to prior issues with falls      SH: retired cardiology nurse at [employer]   Past Medical History:  Diagnosis Date  . Arthritis   . Atrial fibrillation (HCC)   . COPD (chronic obstructive pulmonary disease) (HCC)   . Hip pain, left   . Hypertension   . Sinoatrial node dysfunction (HCC)      No Known Allergies   Current Outpatient Medications  Medication Sig Dispense Refill  . albuterol (PROVENTIL HFA;VENTOLIN HFA) 108 (90 Base) MCG/ACT inhaler Inhale 2 puffs into the lungs every 6 (six) hours as needed for wheezing or shortness of breath. 1 Inhaler 0  . furosemide (LASIX) 40 MG tablet Take 40 mg by mouth daily as needed for edema.     [name] lisinopril (PRINIVIL,ZESTRIL) 40 MG tablet TAKE 1 TABLET BY MOUTH EVERY DAY 90 tablet 3  . metoprolol tartrate (LOPRESSOR) 25 MG tablet Take 1 tablet (25 mg total) by mouth 2 (two) times daily. 180 tablet 3  . Multiple Vitamin (MULTIVITAMIN) tablet Take 1 tablet by mouth 4 (four) times a week.     . warfarin (COUMADIN) 4 MG tablet TAKE 1 AND A HALF TABLETS EVERY DAY or AS DIRECTED 135 tablet 3   No current facility-administered medications for this visit.      Past Surgical History:  Procedure Laterality Date  . BACK SURGERY    . EYE SURGERY Bilateral    cataract removal  . hip arthoplasty     total  . KNEE ARTHROPLASTY    . PPM GENERATOR CHANGEOUT N/A 08/14/2018   Procedure: PPM GENERATOR CHANGEOUT;  Surgeon: Marinus Maw, MD;  Location[facility] INVASIVE CV LAB;   Service: Cardiovascular;  Laterality: N/A;  . uterine polyp removal       No Known Allergies    Family History  Problem Relation Age of Onset  . Prostate cancer Father   . Heart failure Father   . Heart disease Father   . Stroke Sister   . Depression Maternal Grandmother      Social History [name] reports that she has never smoked. She has never used smokeless tobacco. [name] reports that she does not drink alcohol.   Review of Systems CONSTITUTIONAL: No weight loss, fever, chills, weakness or fatigue.  HEENT: Eyes: No visual loss, blurred vision, double vision or yellow sclerae.No hearing loss, sneezing, congestion, runny nose or sore throat.  SKIN: No rash or itching.  CARDIOVASCULAR: per hpi RESPIRATORY: No shortness of breath, cough or sputum.  GASTROINTESTINAL: No anorexia, nausea, vomiting or diarrhea. No abdominal pain or blood.  GENITOURINARY: No burning on urination, no polyuria NEUROLOGICAL: No headache, dizziness, syncope, paralysis, ataxia, numbness or tingling in the extremities. No change in bowel or bladder control.  MUSCULOSKELETAL: No muscle, back pain, joint pain or stiffness.  LYMPHATICS: No enlarged nodes. No history of splenectomy.  PSYCHIATRIC: No history of depression or anxiety.  ENDOCRINOLOGIC: No reports of sweating, cold or heat  intolerance. No polyuria or polydipsia.  [name]   Physical Examination Vitals:   09/22/18 1107  BP: (!) 144/78  Pulse: 73  SpO2: 94%   Vitals:   09/22/18 1107  Weight: 148 lb (67.1 kg)  Height: 5\' 5"  (1.651 m)    Gen: resting comfortably, no acute distress HEENT: no scleral icterus, pupils equal round and reactive, no palptable cervical adenopathy,  CV: RRR, no m/r/g, no jvd Resp: Clear to auscultation bilaterally GI: abdomen is soft, non-tender, non-distended, normal bowel sounds, no hepatosplenomegaly MSK: extremities are warm, no edema.  Skin: warm, no rash Neuro:  no focal deficits Psych:  appropriate affect    Assessment and Plan  1. Afib  - no recent symptoms, continue current meds - not intersted in NOACs, continue coumadin.   2. Tachy-brady syndrome  - no recent symptoms, continue to follow in device clinic.   3. HTN  - reasonable control given advanced age and prior issues with falls, continue current meds   Request labs from pcp F/u 6 months      Antoine Poche, M.D

## 2018-10-06 ENCOUNTER — Ambulatory Visit (INDEPENDENT_AMBULATORY_CARE_PROVIDER_SITE_OTHER): Payer: Medicare HMO | Admitting: *Deleted

## 2018-10-06 DIAGNOSIS — Z5181 Encounter for therapeutic drug level monitoring: Secondary | ICD-10-CM

## 2018-10-06 DIAGNOSIS — I4891 Unspecified atrial fibrillation: Secondary | ICD-10-CM | POA: Diagnosis not present

## 2018-10-06 LAB — POCT INR: INR: 1.4 — AB (ref 2.0–3.0)

## 2018-10-06 NOTE — Patient Instructions (Signed)
Take coumadin 2 1/2 tablets tonight and tomorrow night then resume 1 1/2 tablets daily  Recheck in 2 weeks

## 2018-10-22 ENCOUNTER — Ambulatory Visit (INDEPENDENT_AMBULATORY_CARE_PROVIDER_SITE_OTHER): Payer: Medicare HMO | Admitting: *Deleted

## 2018-10-22 DIAGNOSIS — I4891 Unspecified atrial fibrillation: Secondary | ICD-10-CM

## 2018-10-22 DIAGNOSIS — Z5181 Encounter for therapeutic drug level monitoring: Secondary | ICD-10-CM | POA: Diagnosis not present

## 2018-10-22 LAB — POCT INR: INR: 2 (ref 2.0–3.0)

## 2018-10-22 NOTE — Patient Instructions (Signed)
Increase coumadin to 1 1/2 tablets daily except 2 tablets on Sundays and Wednesdays Recheck in 3 weeks

## 2018-11-17 ENCOUNTER — Ambulatory Visit (INDEPENDENT_AMBULATORY_CARE_PROVIDER_SITE_OTHER): Payer: Medicare HMO | Admitting: Pharmacist

## 2018-11-17 DIAGNOSIS — Z5181 Encounter for therapeutic drug level monitoring: Secondary | ICD-10-CM

## 2018-11-17 DIAGNOSIS — I4891 Unspecified atrial fibrillation: Secondary | ICD-10-CM | POA: Diagnosis not present

## 2018-11-17 LAB — POCT INR: INR: 2 (ref 2.0–3.0)

## 2018-11-17 NOTE — Patient Instructions (Signed)
Description   Continue coumadin 1 1/2 tablets daily except 2 tablets on Sundays and Wednesdays Recheck in 4 weeks

## 2018-11-25 DIAGNOSIS — I5032 Chronic diastolic (congestive) heart failure: Secondary | ICD-10-CM | POA: Diagnosis not present

## 2018-11-25 DIAGNOSIS — J449 Chronic obstructive pulmonary disease, unspecified: Secondary | ICD-10-CM | POA: Diagnosis not present

## 2018-11-25 DIAGNOSIS — I4891 Unspecified atrial fibrillation: Secondary | ICD-10-CM | POA: Diagnosis not present

## 2018-11-25 DIAGNOSIS — Z23 Encounter for immunization: Secondary | ICD-10-CM | POA: Diagnosis not present

## 2018-11-25 DIAGNOSIS — I10 Essential (primary) hypertension: Secondary | ICD-10-CM | POA: Diagnosis not present

## 2018-12-08 ENCOUNTER — Ambulatory Visit: Payer: Medicare HMO | Admitting: Internal Medicine

## 2018-12-08 ENCOUNTER — Encounter: Payer: Self-pay | Admitting: Internal Medicine

## 2018-12-08 ENCOUNTER — Ambulatory Visit (INDEPENDENT_AMBULATORY_CARE_PROVIDER_SITE_OTHER): Payer: Medicare HMO | Admitting: Pharmacist

## 2018-12-08 VITALS — BP 126/68 | HR 72 | Ht 65.0 in | Wt 151.0 lb

## 2018-12-08 DIAGNOSIS — Z5181 Encounter for therapeutic drug level monitoring: Secondary | ICD-10-CM

## 2018-12-08 DIAGNOSIS — I4891 Unspecified atrial fibrillation: Secondary | ICD-10-CM | POA: Diagnosis not present

## 2018-12-08 DIAGNOSIS — I4821 Permanent atrial fibrillation: Secondary | ICD-10-CM | POA: Diagnosis not present

## 2018-12-08 DIAGNOSIS — Z950 Presence of cardiac pacemaker: Secondary | ICD-10-CM | POA: Diagnosis not present

## 2018-12-08 LAB — CUP PACEART INCLINIC DEVICE CHECK
Battery Remaining Longevity: 121 mo
Battery Voltage: 3.04 V
Date Time Interrogation Session: 20200127105539
Implantable Lead Implant Date: 20070518
Implantable Lead Implant Date: 20070518
Implantable Lead Location: 753860
Implantable Pulse Generator Implant Date: 20191003
Lead Channel Pacing Threshold Amplitude: 0.75 V
Lead Channel Pacing Threshold Pulse Width: 0.5 ms
Lead Channel Setting Pacing Amplitude: 2.5 V
Lead Channel Setting Pacing Pulse Width: 0.5 ms
MDC IDC LEAD LOCATION: 753859
MDC IDC MSMT LEADCHNL RV IMPEDANCE VALUE: 650 Ohm
MDC IDC MSMT LEADCHNL RV SENSING INTR AMPL: 12 mV
MDC IDC SET LEADCHNL RV SENSING SENSITIVITY: 2 mV
MDC IDC STAT BRADY RV PERCENT PACED: 90 %
Pulse Gen Model: 1272
Pulse Gen Serial Number: 9067608

## 2018-12-08 LAB — POCT INR: INR: 2.4 (ref 2.0–3.0)

## 2018-12-08 NOTE — Patient Instructions (Signed)
Medication Instructions:  Your physician recommends that you continue on your current medications as directed. Please refer to the Current Medication list given to you today.  If you need a refill on your cardiac medications before your next appointment, please call your pharmacy.   Lab work: NONE  If you have labs (blood work) drawn today and your tests are completely normal, you will receive your results only by: Marland Kitchen MyChart Message (if you have MyChart) OR . A paper copy in the mail If you have any lab test that is abnormal or we need to change your treatment, we will call you to review the results.  Testing/Procedures: NONE   Follow-Up: At [facility], you and your health needs are our priority.  As part of our continuing mission to provide you with exceptional heart care, we have created designated Provider Care Teams.  These Care Teams include your primary Cardiologist (physician) and Advanced Practice Providers (APPs -  Physician Assistants and Nurse Practitioners) who all work together to provide you with the care you need, when you need it. You will need a follow up appointment in 9 months.  Please call our office 2 months in advance to schedule this appointment.  You may see Dr. Marlyn Corporal or one of the following Advanced Practice Providers on your designated Care Team:   Randall An, PA-C [facility]) . Jacolyn Reedy, PA-C [facility] Office)  Any Other Special Instructions Will Be Listed Below (If Applicable). Thank you for choosing [facility]!

## 2018-12-08 NOTE — Patient Instructions (Signed)
Description   Continue coumadin 1 1/2 tablets daily except 2 tablets on Sundays and Wednesdays Recheck in 4 weeks     

## 2018-12-08 NOTE — Progress Notes (Signed)
HPI [name] returns today for followup. She is a 85 yo woman with a h/o CHB and atrial fib, s/p PPM insertion who has undergone gen change out 3 months ago. In the interim she has done well. She only notes a little back pain. She uses a walker as her mobility is down some.  No Known Allergies   Current Outpatient Medications  Medication Sig Dispense Refill  . albuterol (PROVENTIL HFA;VENTOLIN HFA) 108 (90 Base) MCG/ACT inhaler Inhale 2 puffs into the lungs every 6 (six) hours as needed for wheezing or shortness of breath. 1 Inhaler 0  . furosemide (LASIX) 40 MG tablet Take 40 mg by mouth daily as needed for edema.     Marland Kitchen. lisinopril (PRINIVIL,ZESTRIL) 40 MG tablet TAKE 1 TABLET BY MOUTH EVERY DAY 90 tablet 3  . metoprolol tartrate (LOPRESSOR) 25 MG tablet Take 1 tablet (25 mg total) by mouth 2 (two) times daily. 180 tablet 3  . Multiple Vitamin (MULTIVITAMIN) tablet Take 1 tablet by mouth 4 (four) times a week.     . warfarin (COUMADIN) 4 MG tablet TAKE 1 AND A HALF TABLETS EVERY DAY or AS DIRECTED 135 tablet 3   No current facility-administered medications for this visit.      Past Medical History:  Diagnosis Date  . Arthritis   . Atrial fibrillation (HCC)   . COPD (chronic obstructive pulmonary disease) (HCC)   . Hip pain, left   . Hypertension   . Sinoatrial node dysfunction (HCC)     ROS:   All systems reviewed and negative except as noted in the HPI.   Past Surgical History:  Procedure Laterality Date  . BACK SURGERY    . EYE SURGERY Bilateral    cataract removal  . hip arthoplasty     total  . KNEE ARTHROPLASTY    . PPM GENERATOR CHANGEOUT N/A 08/14/2018   Procedure: PPM GENERATOR CHANGEOUT;  Surgeon: Marinus Mawaylor, Braedyn Riggle W, MD;  [facility];  Service: Cardiovascular;  Laterality: N/A;  . uterine polyp removal       Family History  Problem Relation Age of Onset  . Prostate cancer Father   . Heart failure Father   . Heart disease Father     . Stroke Sister   . Depression Maternal Grandmother      Social History   Socioeconomic History  . Marital status: Widowed    Spouse name: Not on file  . Number of children: Not on file  . Years of education: Not on file  . Highest education level: Not on file  Occupational History  . Occupation: Retired    Associate Professormployer: RETIRED  Social Needs  . Financial resource strain: Not on file  . Food insecurity:    Worry: Not on file    Inability: Not on file  . Transportation needs:    Medical: Not on file    Non-medical: Not on file  Tobacco Use  . Smoking status: Never Smoker  . Smokeless tobacco: Never Used  Substance and Sexual Activity  . Alcohol use: No    Alcohol/week: 0.0 standard drinks  . Drug use: No  . Sexual activity: Not Currently    Birth control/protection: Post-menopausal, None  Lifestyle  . Physical activity:    Days per week: Not on file    Minutes per session: Not on file  . Stress: Not on file  Relationships  . Social connections:    Talks on phone: Not on  file    Gets together: Not on file    Attends religious service: Not on file    Active member of club or organization: Not on file    Attends meetings of clubs or organizations: Not on file    Relationship status: Not on file  . Intimate partner violence:    Fear of current or ex partner: Not on file    Emotionally abused: Not on file    Physically abused: Not on file    Forced sexual activity: Not on file  Other Topics Concern  . Not on file  Social History Narrative  . Not on file     BP 126/68   Pulse 72   Ht 5\' 5"  (1.651 m)   Wt 151 lb (68.5 kg)   SpO2 93%   BMI 25.13 kg/m   Physical Exam:  Well appearing NAD HEENT: Unremarkable Neck:  No JVD, no thyromegally Lymphatics:  No adenopathy Back:  No CVA tenderness Lungs:  Clear with no wheezes HEART:  Regular rate rhythm, no murmurs, no rubs, no clicks Abd:  soft, positive bowel sounds, no organomegally, no rebound, no  guarding Ext:  2 plus pulses, no edema, no cyanosis, no clubbing Skin:  No rashes no nodules Neuro:  CN II through XII intact, motor grossly intact   DEVICE  Normal device function.  See PaceArt for details.   Assess/Plan: 1. PPM - she is s/p gen change and doing well.  2. CHB - she is asymptomatic. S/p PPM.  3. HTN - her bp is good today. She will continue her current meds.  4. Atrial fib - her ventricular rate is well controlled and she denies symptoms. Continue warfarin.  [name] ReevesGregg Dale [name],M.D.

## 2019-01-05 ENCOUNTER — Ambulatory Visit (INDEPENDENT_AMBULATORY_CARE_PROVIDER_SITE_OTHER): Payer: Medicare HMO | Admitting: *Deleted

## 2019-01-05 DIAGNOSIS — Z5181 Encounter for therapeutic drug level monitoring: Secondary | ICD-10-CM

## 2019-01-05 DIAGNOSIS — I4891 Unspecified atrial fibrillation: Secondary | ICD-10-CM

## 2019-01-05 LAB — POCT INR: INR: 2.9 (ref 2.0–3.0)

## 2019-01-05 MED ORDER — WARFARIN SODIUM 4 MG PO TABS: ORAL_TABLET | ORAL | 3 refills | Status: DC

## 2019-01-05 NOTE — Patient Instructions (Signed)
Continue coumadin 1 1/2 tablets daily except 2 tablets on Sundays and Wednesdays Recheck in 6 weeks

## 2019-01-26 DIAGNOSIS — H353123 Nonexudative age-related macular degeneration, left eye, advanced atrophic without subfoveal involvement: Secondary | ICD-10-CM | POA: Diagnosis not present

## 2019-01-26 DIAGNOSIS — H43811 Vitreous degeneration, right eye: Secondary | ICD-10-CM | POA: Diagnosis not present

## 2019-01-26 DIAGNOSIS — H35371 Puckering of macula, right eye: Secondary | ICD-10-CM | POA: Diagnosis not present

## 2019-01-26 DIAGNOSIS — H353114 Nonexudative age-related macular degeneration, right eye, advanced atrophic with subfoveal involvement: Secondary | ICD-10-CM | POA: Diagnosis not present

## 2019-02-18 ENCOUNTER — Ambulatory Visit (INDEPENDENT_AMBULATORY_CARE_PROVIDER_SITE_OTHER): Payer: Medicare HMO | Admitting: *Deleted

## 2019-02-18 DIAGNOSIS — Z5181 Encounter for therapeutic drug level monitoring: Secondary | ICD-10-CM | POA: Diagnosis not present

## 2019-02-18 DIAGNOSIS — I4891 Unspecified atrial fibrillation: Secondary | ICD-10-CM

## 2019-02-18 LAB — POCT INR: INR: 3.3 — AB (ref 2.0–3.0)

## 2019-02-18 NOTE — Patient Instructions (Signed)
Hold coumadin tonight then resume 1 1/2 tablets daily except 2 tablets on Sundays and Wednesdays Recheck in 6 weeks

## 2019-03-09 ENCOUNTER — Ambulatory Visit (INDEPENDENT_AMBULATORY_CARE_PROVIDER_SITE_OTHER): Payer: Medicare HMO | Admitting: *Deleted

## 2019-03-09 ENCOUNTER — Other Ambulatory Visit: Payer: Self-pay

## 2019-03-09 DIAGNOSIS — I495 Sick sinus syndrome: Secondary | ICD-10-CM

## 2019-03-09 DIAGNOSIS — I4891 Unspecified atrial fibrillation: Secondary | ICD-10-CM

## 2019-03-09 LAB — CUP PACEART REMOTE DEVICE CHECK
Battery Remaining Longevity: 125 mo
Battery Remaining Percentage: 95.5 %
Battery Voltage: 3.02 V
Brady Statistic RV Percent Paced: 92 %
Date Time Interrogation Session: 20200427060030
Implantable Lead Implant Date: 20070518
Implantable Lead Implant Date: 20070518
Implantable Lead Location: 753859
Implantable Lead Location: 753860
Implantable Pulse Generator Implant Date: 20191003
Lead Channel Impedance Value: 630 Ohm
Lead Channel Pacing Threshold Amplitude: 0.75 V
Lead Channel Pacing Threshold Pulse Width: 0.5 ms
Lead Channel Sensing Intrinsic Amplitude: 12 mV
Lead Channel Setting Pacing Amplitude: 2.5 V
Lead Channel Setting Pacing Pulse Width: 0.5 ms
Lead Channel Setting Sensing Sensitivity: 2 mV
Pulse Gen Model: 1272
Pulse Gen Serial Number: 9067608

## 2019-03-16 NOTE — Progress Notes (Signed)
Remote pacemaker transmission.   

## 2019-03-20 ENCOUNTER — Telehealth: Payer: Self-pay

## 2019-03-20 NOTE — Telephone Encounter (Signed)
Pt agrees to phone visit. Unable to take blood pressure, she will have pulse.      Virtual Visit Pre-Appointment Phone Call  "(Name), I am calling you today to discuss your upcoming appointment. We are currently trying to limit exposure to the virus that causes COVID-19 by seeing patients at home rather than in the office."  1. "What is the BEST phone number to call the day of the visit?" - include this in appointment notes  2. "Do you have or have access to (through a family member/friend) a smartphone with video capability that we can use for your visit?" a. If yes - list this number in appt notes as "cell" (if different from BEST phone #) and list the appointment type as a VIDEO visit in appointment notes b. If no - list the appointment type as a PHONE visit in appointment notes  3. Confirm consent - "In the setting of the current Covid19 crisis, you are scheduled for a (phone or video) visit with your provider on (date) at (time).  Just as we do with many in-office visits, in order for you to participate in this visit, we must obtain consent.  If you'd like, I can send this to your mychart (if signed up) or email for you to review.  Otherwise, I can obtain your verbal consent now.  All virtual visits are billed to your insurance company just like a normal visit would be.  By agreeing to a virtual visit, we'd like you to understand that the technology does not allow for your provider to perform an examination, and thus may limit your provider's ability to fully assess your condition. If your provider identifies any concerns that need to be evaluated in person, we will make arrangements to do so.  Finally, though the technology is pretty good, we cannot assure that it will always work on either your or our end, and in the setting of a video visit, we may have to convert it to a phone-only visit.  In either situation, we cannot ensure that we have a secure connection.  Are you willing to proceed?"  STAFF: Did the patient verbally acknowledge consent to telehealth visit? Document YES/NO here: YES   4. Advise patient to be prepared - "Two hours prior to your appointment, go ahead and check your blood pressure, pulse, oxygen saturation, and your weight (if you have the equipment to check those) and write them all down. When your visit starts, your provider will ask you for this information. If you have an Apple Watch or Kardia device, please plan to have heart rate information ready on the day of your appointment. Please have a pen and paper handy nearby the day of the visit as well."  5. Give patient instructions for MyChart download to smartphone OR Doximity/Doxy.me as below if video visit (depending on what platform provider is using)  6. Inform patient they will receive a phone call 15 minutes prior to their appointment time (may be from unknown caller ID) so they should be prepared to answer    TELEPHONE CALL NOTE  [name] has been deemed a candidate for a follow-up tele-health visit to limit community exposure during the Covid-19 pandemic. I spoke with the patient via phone to ensure availability of phone/video source, confirm preferred email & phone number, and discuss instructions and expectations.  I reminded [name] to be prepared with any vital sign and/or heart rhythm information that could potentially be obtained via home monitoring,  at the time of her visit. I reminded [name] to expect a phone call prior to her visit.  Fonnie Birkenhead, CMA 03/20/2019 9:31 AM   INSTRUCTIONS FOR DOWNLOADING THE MYCHART APP TO SMARTPHONE  - The patient must first make sure to have activated MyChart and know their login information - If Apple, go to [employer] and type in MyChart in the search bar and download the app. If Android, ask patient to go to [employer] and type in [place] in the search bar and download the app. The app is free but as with any other app  downloads, their phone may require them to verify saved payment information or Apple/Android password.  - The patient will need to then log into the app with their MyChart username and password, and select [place] as their healthcare provider to link the account. When it is time for your visit, go to the MyChart app, find appointments, and click Begin Video Visit. Be sure to Select Allow for your device to access the Microphone and Camera for your visit. You will then be connected, and your provider will be with you shortly.  **If they have any issues connecting, or need assistance please contact MyChart service desk [phone])**  **If using a computer, in order to ensure the best quality for their visit they will need to use either of the following Internet Browsers: [employer] or Google Chrome**  IF USING DOXIMITY or DOXY.ME - The patient will receive a link just prior to their visit by text.     FULL LENGTH CONSENT FOR TELE-HEALTH VISIT   I hereby voluntarily request, consent and authorize [facility] and its employed or contracted physicians, physician assistants, nurse practitioners or other licensed health care professionals (the Practitioner), to provide me with telemedicine health care services (the "Services") as deemed necessary by the treating Practitioner. I acknowledge and consent to receive the Services by the Practitioner via telemedicine. I understand that the telemedicine visit will involve communicating with the Practitioner through live audiovisual communication technology and the disclosure of certain medical information by electronic transmission. I acknowledge that I have been given the opportunity to request an in-person assessment or other available alternative prior to the telemedicine visit and am voluntarily participating in the telemedicine visit.  I understand that I have the right to withhold or withdraw my consent to the use of telemedicine  in the course of my care at any time, without affecting my right to future care or treatment, and that the Practitioner or I may terminate the telemedicine visit at any time. I understand that I have the right to inspect all information obtained and/or recorded in the course of the telemedicine visit and may receive copies of available information for a reasonable fee.  I understand that some of the potential risks of receiving the Services via telemedicine include:  Marland Kitchen Delay or interruption in medical evaluation due to technological equipment failure or disruption; . Information transmitted may not be sufficient (e.g. poor resolution of images) to allow for appropriate medical decision making by the Practitioner; and/or  . In rare instances, security protocols could fail, causing a breach of personal health information.  Furthermore, I acknowledge that it is my responsibility to provide information about my medical history, conditions and care that is complete and accurate to the best of my ability. I acknowledge that Practitioner's advice, recommendations, and/or decision may be based on factors not within their control, such as incomplete or  inaccurate data provided by me or distortions of diagnostic images or specimens that may result from electronic transmissions. I understand that the practice of medicine is not an exact science and that Practitioner makes no warranties or guarantees regarding treatment outcomes. I acknowledge that I will receive a copy of this consent concurrently upon execution via email to the email address I last provided but may also request a printed copy by calling the office of [facility].    I understand that my insurance will be billed for this visit.   I have read or had this consent read to me. . I understand the contents of this consent, which adequately explains the benefits and risks of the Services being provided via telemedicine.  . I have been provided ample  opportunity to ask questions regarding this consent and the Services and have had my questions answered to my satisfaction. . I give my informed consent for the services to be provided through the use of telemedicine in my medical care  By participating in this telemedicine visit I agree to the above.

## 2019-03-25 ENCOUNTER — Telehealth (INDEPENDENT_AMBULATORY_CARE_PROVIDER_SITE_OTHER): Payer: Medicare HMO | Admitting: Cardiology

## 2019-03-25 ENCOUNTER — Encounter: Payer: Self-pay | Admitting: Cardiology

## 2019-03-25 VITALS — Ht 65.5 in | Wt 145.0 lb

## 2019-03-25 DIAGNOSIS — I4891 Unspecified atrial fibrillation: Secondary | ICD-10-CM | POA: Diagnosis not present

## 2019-03-25 DIAGNOSIS — I495 Sick sinus syndrome: Secondary | ICD-10-CM

## 2019-03-25 DIAGNOSIS — I10 Essential (primary) hypertension: Secondary | ICD-10-CM

## 2019-03-25 NOTE — Progress Notes (Signed)
Virtual Visit via Telephone Note   This visit type was conducted due to national recommendations for restrictions regarding the COVID-19 Pandemic (e.g. social distancing) in an effort to limit this patient's exposure and mitigate transmission in our community.  Due to her co-morbid illnesses, this patient is at least at moderate risk for complications without adequate follow up.  This format is felt to be most appropriate for this patient at this time.  The patient did not have access to video technology/had technical difficulties with video requiring transitioning to audio format only (telephone).  All issues noted in this document were discussed and addressed.  No physical exam could be performed with this format.  Please refer to the patient's chart for her  consent to telehealth for [facility].   Date:  03/25/2019   ID:  [name] H Yates, DOB 12-30-1922, MRN [id number]  Patient Location: Home Provider Location: Home  PCP:  [name], Edward, MD  Cardiologist:  Dina RichBranch, Ilhan Debenedetto, MD  Electrophysiologist:  None   Evaluation Performed:  Follow-Up Visit  Chief Complaint:  6 month follow up  History of Present Illness:    [name] H Procter is a 85 y.o. female with seen today for follow up of the following medical problems.    1. Afib - has not been interested in NOACs. No bleeding issues on coumadin  - no recent palpitations - compliant with meds   2. Tachy-Brady Syndrome  - has permanent pacemaker followed by EP Dr Ladona Ridgelaylor - s/p gen change by Dr Ladona Ridgelaylor 08/2018  - 02/2019 device check normal function - denies any recent symptoms  3. HTN  -We have been accepting high bp's for her due to prior issues with falls - compliant with meds     SH: retired cardiology nurse at [employer]    The patient does not have symptoms concerning for COVID-19 infection (fever, chills, cough, or new shortness of breath).    Past Medical History:  Diagnosis Date  . Arthritis    . Atrial fibrillation (HCC)   . COPD (chronic obstructive pulmonary disease) (HCC)   . Hip pain, left   . Hypertension   . Sinoatrial node dysfunction (HCC)    Past Surgical History:  Procedure Laterality Date  . BACK SURGERY    . EYE SURGERY Bilateral    cataract removal  . hip arthoplasty     total  . KNEE ARTHROPLASTY    . PPM GENERATOR CHANGEOUT N/A 08/14/2018   Procedure: PPM GENERATOR CHANGEOUT;  Surgeon: Marinus Mawaylor, Gregg W, MD;  [facility];  Service: Cardiovascular;  Laterality: N/A;  . uterine polyp removal       No outpatient medications have been marked as taking for the 03/25/19 encounter (Appointment) with Antoine PocheBranch, Jahdai Padovano F, MD.     Allergies:   Patient has no known allergies.   Social History   Tobacco Use  . Smoking status: Never Smoker  . Smokeless tobacco: Never Used  Substance Use Topics  . Alcohol use: No    Alcohol/week: 0.0 standard drinks  . Drug use: No     Family Hx: The patient's family history includes Depression in her maternal grandmother; Heart disease in her father; Heart failure in her father; Prostate cancer in her father; Stroke in her sister.  ROS:   Please see the history of present illness.     All other systems reviewed and are negative.   Prior CV studies:   The following studies were reviewed today:  Labs/Other Tests and Data Reviewed:    EKG:  na  Recent Labs: 08/06/2018: BUN 19; Creatinine, Ser 0.54; Hemoglobin 10.9; Platelets 257; Potassium 4.6; Sodium 139   Recent Lipid Panel Lab Results  Component Value Date/Time   CHOL 166 03/05/2007   TRIG 129 03/05/2007   HDL 50 03/05/2007   LDLCALC 90 03/05/2007    Wt Readings from Last 3 Encounters:  12/08/18 151 lb (68.5 kg)  09/22/18 148 lb (67.1 kg)  08/14/18 151 lb (68.5 kg)     Objective:    Vital Signs:  There were no vitals taken for this visit.   Normal affect. Normal speech pattern and tone. Comfortable, no apparent distress. No audible  signs of SOB or DOE.   ASSESSMENT & PLAN:    1. Afib  - not intersted in NOACs, continue coumadin.  - no symptoms, continue current meds  2. Tachy-brady syndrome  - normal recent device check, no symptoms, continue to monitor.   3. HTN  - tolerating higher goal bp's due to advanced age and prior falls - continue current meds  COVID-19 Education: The signs and symptoms of COVID-19 were discussed with the patient and how to seek care for testing (follow up with PCP or arrange E-visit).  The importance of social distancing was discussed today.  Time:   Today, I have spent 15 minutes with the patient with telehealth technology discussing the above problems.     Medication Adjustments/Labs and Tests Ordered: Current medicines are reviewed at length with the patient today.  Concerns regarding medicines are outlined above.   Tests Ordered: No orders of the defined types were placed in this encounter.   Medication Changes: No orders of the defined types were placed in this encounter.   Disposition:  Follow up 6 months  Signed, Dina Rich, MD  03/25/2019 8:24 AM    [facility]

## 2019-03-25 NOTE — Progress Notes (Signed)

## 2019-04-08 ENCOUNTER — Ambulatory Visit (INDEPENDENT_AMBULATORY_CARE_PROVIDER_SITE_OTHER): Payer: Medicare HMO | Admitting: *Deleted

## 2019-04-08 DIAGNOSIS — Z5181 Encounter for therapeutic drug level monitoring: Secondary | ICD-10-CM

## 2019-04-08 DIAGNOSIS — I4891 Unspecified atrial fibrillation: Secondary | ICD-10-CM

## 2019-04-08 LAB — POCT INR: INR: 3.1 — AB (ref 2.0–3.0)

## 2019-04-08 NOTE — Patient Instructions (Signed)
Decrease coumadin to 1 1/2 tablets daily Recheck in 6 weeks

## 2019-05-19 ENCOUNTER — Emergency Department (HOSPITAL_COMMUNITY): Payer: Medicare HMO

## 2019-05-19 ENCOUNTER — Other Ambulatory Visit: Payer: Self-pay

## 2019-05-19 ENCOUNTER — Inpatient Hospital Stay (HOSPITAL_COMMUNITY)
Admission: EM | Admit: 2019-05-19 | Discharge: 2019-06-02 | DRG: 177 | Disposition: A | Discharge status: Skilled Nursing Facility | Payer: Medicare HMO | Attending: Internal Medicine | Admitting: Internal Medicine

## 2019-05-19 ENCOUNTER — Inpatient Hospital Stay (HOSPITAL_COMMUNITY): Payer: Medicare HMO

## 2019-05-19 ENCOUNTER — Encounter (HOSPITAL_COMMUNITY): Payer: Self-pay

## 2019-05-19 DIAGNOSIS — R000 Tachycardia, unspecified: Secondary | ICD-10-CM | POA: Diagnosis not present

## 2019-05-19 DIAGNOSIS — Z79899 Other long term (current) drug therapy: Secondary | ICD-10-CM | POA: Diagnosis not present

## 2019-05-19 DIAGNOSIS — D539 Nutritional anemia, unspecified: Secondary | ICD-10-CM | POA: Diagnosis present

## 2019-05-19 DIAGNOSIS — R509 Fever, unspecified: Secondary | ICD-10-CM | POA: Diagnosis not present

## 2019-05-19 DIAGNOSIS — I495 Sick sinus syndrome: Secondary | ICD-10-CM | POA: Diagnosis present

## 2019-05-19 DIAGNOSIS — J431 Panlobular emphysema: Secondary | ICD-10-CM | POA: Diagnosis not present

## 2019-05-19 DIAGNOSIS — R112 Nausea with vomiting, unspecified: Secondary | ICD-10-CM | POA: Diagnosis present

## 2019-05-19 DIAGNOSIS — Z66 Do not resuscitate: Secondary | ICD-10-CM | POA: Diagnosis not present

## 2019-05-19 DIAGNOSIS — Z8249 Family history of ischemic heart disease and other diseases of the circulatory system: Secondary | ICD-10-CM | POA: Diagnosis not present

## 2019-05-19 DIAGNOSIS — R791 Abnormal coagulation profile: Secondary | ICD-10-CM | POA: Diagnosis present

## 2019-05-19 DIAGNOSIS — R05 Cough: Secondary | ICD-10-CM | POA: Diagnosis not present

## 2019-05-19 DIAGNOSIS — Z96649 Presence of unspecified artificial hip joint: Secondary | ICD-10-CM | POA: Diagnosis present

## 2019-05-19 DIAGNOSIS — Z7901 Long term (current) use of anticoagulants: Secondary | ICD-10-CM

## 2019-05-19 DIAGNOSIS — M1990 Unspecified osteoarthritis, unspecified site: Secondary | ICD-10-CM | POA: Diagnosis present

## 2019-05-19 DIAGNOSIS — Z9981 Dependence on supplemental oxygen: Secondary | ICD-10-CM

## 2019-05-19 DIAGNOSIS — J9601 Acute respiratory failure with hypoxia: Secondary | ICD-10-CM | POA: Diagnosis present

## 2019-05-19 DIAGNOSIS — R627 Adult failure to thrive: Secondary | ICD-10-CM | POA: Diagnosis not present

## 2019-05-19 DIAGNOSIS — J168 Pneumonia due to other specified infectious organisms: Secondary | ICD-10-CM | POA: Diagnosis not present

## 2019-05-19 DIAGNOSIS — Z950 Presence of cardiac pacemaker: Secondary | ICD-10-CM | POA: Diagnosis present

## 2019-05-19 DIAGNOSIS — U071 COVID-19: Principal | ICD-10-CM | POA: Diagnosis present

## 2019-05-19 DIAGNOSIS — R0602 Shortness of breath: Secondary | ICD-10-CM | POA: Diagnosis present

## 2019-05-19 DIAGNOSIS — J440 Chronic obstructive pulmonary disease with acute lower respiratory infection: Secondary | ICD-10-CM | POA: Diagnosis not present

## 2019-05-19 DIAGNOSIS — I10 Essential (primary) hypertension: Secondary | ICD-10-CM

## 2019-05-19 DIAGNOSIS — J1289 Other viral pneumonia: Secondary | ICD-10-CM | POA: Diagnosis present

## 2019-05-19 DIAGNOSIS — R2681 Unsteadiness on feet: Secondary | ICD-10-CM | POA: Diagnosis not present

## 2019-05-19 DIAGNOSIS — I110 Hypertensive heart disease with heart failure: Secondary | ICD-10-CM | POA: Diagnosis present

## 2019-05-19 DIAGNOSIS — R262 Difficulty in walking, not elsewhere classified: Secondary | ICD-10-CM | POA: Diagnosis not present

## 2019-05-19 DIAGNOSIS — R52 Pain, unspecified: Secondary | ICD-10-CM | POA: Diagnosis not present

## 2019-05-19 DIAGNOSIS — Z96659 Presence of unspecified artificial knee joint: Secondary | ICD-10-CM | POA: Diagnosis present

## 2019-05-19 DIAGNOSIS — I4891 Unspecified atrial fibrillation: Secondary | ICD-10-CM | POA: Diagnosis present

## 2019-05-19 DIAGNOSIS — R278 Other lack of coordination: Secondary | ICD-10-CM | POA: Diagnosis not present

## 2019-05-19 DIAGNOSIS — Z8542 Personal history of malignant neoplasm of other parts of uterus: Secondary | ICD-10-CM | POA: Diagnosis not present

## 2019-05-19 DIAGNOSIS — I482 Chronic atrial fibrillation, unspecified: Secondary | ICD-10-CM | POA: Diagnosis not present

## 2019-05-19 DIAGNOSIS — M2550 Pain in unspecified joint: Secondary | ICD-10-CM | POA: Diagnosis not present

## 2019-05-19 DIAGNOSIS — Z20828 Contact with and (suspected) exposure to other viral communicable diseases: Secondary | ICD-10-CM | POA: Diagnosis not present

## 2019-05-19 DIAGNOSIS — M129 Arthropathy, unspecified: Secondary | ICD-10-CM

## 2019-05-19 DIAGNOSIS — J1282 Pneumonia due to coronavirus disease 2019: Secondary | ICD-10-CM | POA: Diagnosis present

## 2019-05-19 DIAGNOSIS — J449 Chronic obstructive pulmonary disease, unspecified: Secondary | ICD-10-CM | POA: Diagnosis not present

## 2019-05-19 DIAGNOSIS — J189 Pneumonia, unspecified organism: Secondary | ICD-10-CM

## 2019-05-19 DIAGNOSIS — I480 Paroxysmal atrial fibrillation: Secondary | ICD-10-CM | POA: Diagnosis not present

## 2019-05-19 DIAGNOSIS — I4821 Permanent atrial fibrillation: Secondary | ICD-10-CM | POA: Diagnosis not present

## 2019-05-19 DIAGNOSIS — M6281 Muscle weakness (generalized): Secondary | ICD-10-CM | POA: Diagnosis not present

## 2019-05-19 DIAGNOSIS — D510 Vitamin B12 deficiency anemia due to intrinsic factor deficiency: Secondary | ICD-10-CM | POA: Diagnosis present

## 2019-05-19 DIAGNOSIS — I4819 Other persistent atrial fibrillation: Secondary | ICD-10-CM | POA: Diagnosis not present

## 2019-05-19 DIAGNOSIS — J181 Lobar pneumonia, unspecified organism: Secondary | ICD-10-CM | POA: Diagnosis present

## 2019-05-19 DIAGNOSIS — R41841 Cognitive communication deficit: Secondary | ICD-10-CM | POA: Diagnosis not present

## 2019-05-19 DIAGNOSIS — R0902 Hypoxemia: Secondary | ICD-10-CM | POA: Diagnosis not present

## 2019-05-19 DIAGNOSIS — I5032 Chronic diastolic (congestive) heart failure: Secondary | ICD-10-CM | POA: Diagnosis not present

## 2019-05-19 DIAGNOSIS — Z7401 Bed confinement status: Secondary | ICD-10-CM | POA: Diagnosis not present

## 2019-05-19 DIAGNOSIS — I5033 Acute on chronic diastolic (congestive) heart failure: Secondary | ICD-10-CM | POA: Diagnosis not present

## 2019-05-19 DIAGNOSIS — Z209 Contact with and (suspected) exposure to unspecified communicable disease: Secondary | ICD-10-CM | POA: Diagnosis not present

## 2019-05-19 LAB — CBC WITH DIFFERENTIAL/PLATELET
Abs Immature Granulocytes: 0.03 10*3/uL (ref 0.00–0.07)
Basophils Absolute: 0 10*3/uL (ref 0.0–0.1)
Basophils Relative: 0 %
Eosinophils Absolute: 0 10*3/uL (ref 0.0–0.5)
Eosinophils Relative: 0 %
HCT: 40 % (ref 36.0–46.0)
Hemoglobin: 12.2 g/dL (ref 12.0–15.0)
Immature Granulocytes: 0 %
Lymphocytes Relative: 3 %
Lymphs Abs: 0.3 10*3/uL — ABNORMAL LOW (ref 0.7–4.0)
MCH: 31.5 pg (ref 26.0–34.0)
MCHC: 30.5 g/dL (ref 30.0–36.0)
MCV: 103.4 fL — ABNORMAL HIGH (ref 80.0–100.0)
Monocytes Absolute: 0.7 10*3/uL (ref 0.1–1.0)
Monocytes Relative: 6 %
Neutro Abs: 10.7 10*3/uL — ABNORMAL HIGH (ref 1.7–7.7)
Neutrophils Relative %: 91 %
Platelets: 208 10*3/uL (ref 150–400)
RBC: 3.87 MIL/uL (ref 3.87–5.11)
RDW: 17 % — ABNORMAL HIGH (ref 11.5–15.5)
WBC: 11.7 10*3/uL — ABNORMAL HIGH (ref 4.0–10.5)
nRBC: 0 % (ref 0.0–0.2)

## 2019-05-19 LAB — SARS CORONAVIRUS 2 BY RT PCR (HOSPITAL ORDER, PERFORMED IN ~~LOC~~ HOSPITAL LAB): SARS Coronavirus 2: NEGATIVE

## 2019-05-19 LAB — COMPREHENSIVE METABOLIC PANEL
ALT: 23 U/L (ref 0–44)
AST: 34 U/L (ref 15–41)
Albumin: 3.5 g/dL (ref 3.5–5.0)
Alkaline Phosphatase: 71 U/L (ref 38–126)
Anion gap: 9 (ref 5–15)
BUN: 19 mg/dL (ref 8–23)
CO2: 27 mmol/L (ref 22–32)
Calcium: 8.5 mg/dL — ABNORMAL LOW (ref 8.9–10.3)
Chloride: 104 mmol/L (ref 98–111)
Creatinine, Ser: 0.56 mg/dL (ref 0.44–1.00)
GFR calc Af Amer: >60 mL/min (ref >60)
GFR calc non Af Amer: >60 mL/min (ref >60)
Glucose, Bld: 111 mg/dL — ABNORMAL HIGH (ref 70–99)
Potassium: 3.7 mmol/L (ref 3.5–5.1)
Sodium: 140 mmol/L (ref 135–145)
Total Bilirubin: 0.8 mg/dL (ref 0.3–1.2)
Total Protein: 7.3 g/dL (ref 6.5–8.1)

## 2019-05-19 LAB — PROTIME-INR
INR: 3.6 — ABNORMAL HIGH (ref 0.8–1.2)
Prothrombin Time: 35 seconds — ABNORMAL HIGH (ref 11.4–15.2)

## 2019-05-19 LAB — BRAIN NATRIURETIC PEPTIDE: B Natriuretic Peptide: 214.9 pg/mL — ABNORMAL HIGH (ref 0.0–100.0)

## 2019-05-19 LAB — MAGNESIUM: Magnesium: 2 mg/dL (ref 1.7–2.4)

## 2019-05-19 LAB — PHOSPHORUS: Phosphorus: 3.9 mg/dL (ref 2.5–4.6)

## 2019-05-19 MED ORDER — ONDANSETRON HCL 4 MG/2ML IJ SOLN: 4.0000 mg | Freq: Four times a day (QID) | INTRAMUSCULAR | Status: DC | PRN

## 2019-05-19 MED ORDER — LEVALBUTEROL HCL 0.63 MG/3ML IN NEBU: 0.6300 mg | INHALATION_SOLUTION | Freq: Four times a day (QID) | RESPIRATORY_TRACT | Status: DC | PRN

## 2019-05-19 MED ORDER — LEVALBUTEROL HCL 0.63 MG/3ML IN NEBU
0.6300 mg | INHALATION_SOLUTION | Freq: Four times a day (QID) | RESPIRATORY_TRACT | Status: DC
  Administered 2019-05-19: 0.63 mg via RESPIRATORY_TRACT
  Filled 2019-05-19: qty 3

## 2019-05-19 MED ORDER — ACETAMINOPHEN 650 MG RE SUPP: 650.0000 mg | Freq: Four times a day (QID) | RECTAL | Status: DC | PRN

## 2019-05-19 MED ORDER — ONDANSETRON HCL 4 MG PO TABS: 4.0000 mg | ORAL_TABLET | Freq: Four times a day (QID) | ORAL | Status: DC | PRN

## 2019-05-19 MED ORDER — LEVALBUTEROL HCL 0.63 MG/3ML IN NEBU
0.6300 mg | INHALATION_SOLUTION | Freq: Four times a day (QID) | RESPIRATORY_TRACT | Status: DC
  Administered 2019-05-20 (×2): 0.63 mg via RESPIRATORY_TRACT
  Filled 2019-05-19 (×2): qty 3

## 2019-05-19 MED ORDER — IOHEXOL 300 MG/ML  SOLN
75.0000 mL | Freq: Once | INTRAMUSCULAR | Status: AC | PRN
  Administered 2019-05-19: 75 mL via INTRAVENOUS

## 2019-05-19 MED ORDER — SODIUM CHLORIDE 0.9 % IV SOLN
500.0000 mg | INTRAVENOUS | Status: DC
  Administered 2019-05-20 – 2019-05-21 (×2): 500 mg via INTRAVENOUS
  Filled 2019-05-19 (×4): qty 500

## 2019-05-19 MED ORDER — SODIUM CHLORIDE 0.9 % IV SOLN
INTRAVENOUS | Status: AC
  Administered 2019-05-19: 18:00:00 via INTRAVENOUS

## 2019-05-19 MED ORDER — SODIUM CHLORIDE 0.9 % IV SOLN
2.0000 g | INTRAVENOUS | Status: DC
  Administered 2019-05-20 – 2019-05-21 (×2): 2 g via INTRAVENOUS
  Filled 2019-05-19 (×2): qty 2

## 2019-05-19 MED ORDER — ACETAMINOPHEN 325 MG PO TABS: 650.0000 mg | ORAL_TABLET | Freq: Four times a day (QID) | ORAL | Status: DC | PRN

## 2019-05-19 MED ORDER — BISACODYL 10 MG RE SUPP: 10.0000 mg | Freq: Every day | RECTAL | Status: DC | PRN

## 2019-05-19 MED ORDER — SODIUM CHLORIDE 0.9 % IV SOLN
500.0000 mg | Freq: Once | INTRAVENOUS | Status: AC
  Administered 2019-05-19: 15:00:00 500 mg via INTRAVENOUS
  Filled 2019-05-19: qty 500

## 2019-05-19 MED ORDER — SODIUM CHLORIDE 0.9 % IV SOLN
1.0000 g | Freq: Once | INTRAVENOUS | Status: AC
  Administered 2019-05-19: 14:00:00 1 g via INTRAVENOUS
  Filled 2019-05-19: qty 10

## 2019-05-19 MED ORDER — POLYETHYLENE GLYCOL 3350 17 G PO PACK: 17.0000 g | PACK | Freq: Every day | ORAL | Status: DC | PRN

## 2019-05-19 MED ORDER — METOPROLOL TARTRATE 25 MG PO TABS
25.0000 mg | ORAL_TABLET | Freq: Two times a day (BID) | ORAL | Status: DC
  Administered 2019-05-19 – 2019-05-20 (×2): 25 mg via ORAL
  Filled 2019-05-19 (×2): qty 1

## 2019-05-19 MED ORDER — ONDANSETRON HCL 4 MG/2ML IJ SOLN
4.0000 mg | Freq: Once | INTRAMUSCULAR | Status: AC
  Administered 2019-05-19: 12:00:00 4 mg via INTRAVENOUS
  Filled 2019-05-19: qty 2

## 2019-05-19 MED ORDER — ONE-DAILY MULTI VITAMINS PO TABS: 1.0000 | ORAL_TABLET | ORAL | Status: DC

## 2019-05-19 MED ORDER — ADULT MULTIVITAMIN W/MINERALS CH
1.0000 | ORAL_TABLET | Freq: Every day | ORAL | Status: DC
  Administered 2019-05-20 – 2019-06-02 (×14): 1 via ORAL
  Filled 2019-05-19 (×14): qty 1

## 2019-05-19 MED ORDER — IPRATROPIUM BROMIDE 0.02 % IN SOLN
0.5000 mg | Freq: Four times a day (QID) | RESPIRATORY_TRACT | Status: DC
  Administered 2019-05-19: 0.5 mg via RESPIRATORY_TRACT
  Filled 2019-05-19: qty 2.5

## 2019-05-19 MED ORDER — IPRATROPIUM BROMIDE 0.02 % IN SOLN
0.5000 mg | Freq: Four times a day (QID) | RESPIRATORY_TRACT | Status: DC
  Administered 2019-05-20 (×2): 0.5 mg via RESPIRATORY_TRACT
  Filled 2019-05-19 (×2): qty 2.5

## 2019-05-19 NOTE — H&P (Addendum)
History and Physical    [name] Mochaempie H Giuffre ZOX:[id number] DOB: Aug 15, 1923 DOA: 05/19/2019  PCP: [name], Edward, MD   Patient coming from: Home  Chief Complaint: Nausea and vomiting associated with chills  HPI: [name] is a 85 y.o. female with medical history significant for but not limited to Arthritis, history of a hip fracture, history of endometrial carcinoma, history of sinoatrial node dysfunction status post permanent pacemaker placement, history of atrial fibrillation on anticoagulation with Coumadin, history of COPD, history of arthritis, history of hypertension, and other comorbidities who presents with a chief complaint of nausea and vomiting associated with chills and then subsequent shortness of breath.  Patient states that she woke up not feeling well started vomiting.  She vomited multiple times and also had chills.  Patient continued to vomit and then went to go and she sat on the toilet she vomited some more.  She called her stepdaughter who then called her husband and then they called EMS.  Patient states that she did not feel short of breath but started wheezing and then had a cough but does not know what color sputum it was.  She ambulates and lives by herself but family checks on her quite frequently.  When I examined her in the emergency department she states that she feels much better but she is wearing supplemental oxygen via nasal cannula and given a dose of antiemetics.  She was started on azithromycin and ceftriaxone for pneumonia.  [facility] was asked admit this patient for acute hypoxic respiratory failure secondary to community acquired pneumonia along with associated nausea vomiting.  ED Course: In the ED patient had basic blood work done as well as a chest x-ray which showed a right-sided upper lobe and lower lobe pneumonia.  Patient was given IV Zofran 4 mg x 1 and also was given IV ceftriaxone and azithromycin for a community-acquired pneumonia.  She had a SARS-CoV-2 test  which was negative.  KG was also done in the ED  Review of Systems: As per HPI otherwise all other systems reviewed and negative.   Past Medical History:  Diagnosis Date   Arthritis    Atrial fibrillation (HCC)    COPD (chronic obstructive pulmonary disease) (HCC)    Hip pain, left    Hypertension    Sinoatrial node dysfunction (HCC)    Past Surgical History:  Procedure Laterality Date   BACK SURGERY     EYE SURGERY Bilateral    cataract removal   hip arthoplasty     total   KNEE ARTHROPLASTY     PPM GENERATOR CHANGEOUT N/A 08/14/2018   Procedure: PPM GENERATOR CHANGEOUT;  Surgeon: Marinus Mawaylor, Gregg W, MD;  Location: [facility];  Service: Cardiovascular;  Laterality: N/A;   uterine polyp removal     SOCIAL HISTORY   reports that she has never smoked. She has never used smokeless tobacco. She reports that she does not drink alcohol or use drugs.  ALLERGIES No Known Allergies  Family History  Problem Relation Age of Onset   Prostate cancer Father    Heart failure Father    Heart disease Father    Stroke Sister    Depression Maternal Grandmother    Prior to Admission medications   Medication Sig Start Date End Date Taking? Authorizing Provider  furosemide (LASIX) 40 MG tablet Take 40 mg by mouth daily as needed for edema.  08/26/17  Yes [provider]  lisinopril (PRINIVIL,ZESTRIL) 40 MG tablet TAKE 1 TABLET BY MOUTH  EVERY DAY 04/05/16  Yes Branch, Alphonse Guild, MD  metoprolol tartrate (LOPRESSOR) 25 MG tablet Take 1 tablet (25 mg total) by mouth 2 (two) times daily. 05/07/16  Yes BranchAlphonse Guild, MD  Multiple Vitamin (MULTIVITAMIN) tablet Take 1 tablet by mouth 4 (four) times a week.    Yes [provider]  warfarin (COUMADIN) 4 MG tablet TAKE 1 AND A HALF TABLETS EVERY DAY EXCEPT 2 TABLETS ON SUNDAYS AND WEDNESDAYS OR AS DIRECTED Patient taking differently: Take 6 mg by mouth every evening.  01/05/19  Yes Branch, Alphonse Guild, MD    albuterol (PROVENTIL HFA;VENTOLIN HFA) 108 (90 Base) MCG/ACT inhaler Inhale 2 puffs into the lungs every 6 (six) hours as needed for wheezing or shortness of breath. Patient not taking: Reported on 05/19/2019 11/15/16   Molt, Terra Bella, DO   Physical Exam: Vitals:   05/19/19 1448 05/19/19 1500 05/19/19 1530 05/19/19 1706  BP:  (!) 105/93 131/71 (!) 122/51  Pulse: 62 63 65 64  Resp: 18 19 (!) 22 (!) 24  Temp:    98 F (36.7 C)  TempSrc:    Oral  SpO2: 100% 99% 99% 99%  Weight:    68.4 kg  Height:    5\' 5"  (1.651 m)   Constitutional: WN/WD elderly Caucasian  NAD and appears calm Eyes: Lids and conjunctivae normal, sclerae anicteric  ENMT: External Ears, Nose appear normal. Slightly hard of hearing.  Neck: Appears normal, supple, no cervical masses, normal ROM, no appreciable thyromegaly; no JVD Respiratory: Diminished to auscultation bilaterally with coarse breath sounds and mild rhonchi but no appreciable wheezing, rales, crackles. Slightly increased respiratory effort. Wearing 4 Liters of Supplemental O2 via Cutter Cardiovascular: RRR and is paced, no murmurs / rubs / gallops. S1 and S2 auscultated. Trace extremity edema  Abdomen: Soft, non-tender, Mildly distended. No masses palpated. No appreciable hepatosplenomegaly. Bowel sounds positive x4.  GU: Deferred. Musculoskeletal: No clubbing / cyanosis of digits/nails. No joint deformity upper and lower extremities.  Skin: No rashes, lesions, ulcers on a limited skin evaluation. No induration; Warm and dry.  Neurologic: CN 2-12 grossly intact with no focal deficits. Romberg sign cerebellar and reflexes not assessed.  Psychiatric: Normal judgment and insight. Alert and oriented x 3. Pleasant mood and appropriate affect.   Labs on Admission: I have personally reviewed following labs and imaging studies  CBC: Recent Labs  Lab 05/19/19 1142  WBC 11.7*  NEUTROABS 10.7*  HGB 12.2  HCT 40.0  MCV 103.4*  PLT 350   Basic Metabolic  Panel: Recent Labs  Lab 05/19/19 1142  NA 140  K 3.7  CL 104  CO2 27  GLUCOSE 111*  BUN 19  CREATININE 0.56  CALCIUM 8.5*   GFR: Estimated Creatinine Clearance: 41.8 mL/min (by C-G formula based on SCr of 0.56 mg/dL). Liver Function Tests: Recent Labs  Lab 05/19/19 1142  AST 34  ALT 23  ALKPHOS 71  BILITOT 0.8  PROT 7.3  ALBUMIN 3.5   No results for input(s): LIPASE, AMYLASE in the last 168 hours. No results for input(s): AMMONIA in the last 168 hours. Coagulation Profile: Recent Labs  Lab 05/19/19 1142  INR 3.6*   Cardiac Enzymes: No results for input(s): CKTOTAL, CKMB, CKMBINDEX, TROPONINI in the last 168 hours. BNP (last 3 results) No results for input(s): PROBNP in the last 8760 hours. HbA1C: No results for input(s): HGBA1C in the last 72 hours. CBG: No results for input(s): GLUCAP in the last 168 hours. Lipid Profile: No results  for input(s): CHOL, HDL, LDLCALC, TRIG, CHOLHDL, LDLDIRECT in the last 72 hours. Thyroid Function Tests: No results for input(s): TSH, T4TOTAL, FREET4, T3FREE, THYROIDAB in the last 72 hours. Anemia Panel: No results for input(s): VITAMINB12, FOLATE, FERRITIN, TIBC, IRON, RETICCTPCT in the last 72 hours. Urine analysis:    Component Value Date/Time   COLORURINE YELLOW 11/12/2016 1115   APPEARANCEUR CLEAR 11/12/2016 1115   LABSPEC 1.016 11/12/2016 1115   PHURINE 7.0 11/12/2016 1115   GLUCOSEU NEGATIVE 11/12/2016 1115   HGBUR SMALL (A) 11/12/2016 1115   BILIRUBINUR NEGATIVE 11/12/2016 1115   KETONESUR 5 (A) 11/12/2016 1115   PROTEINUR NEGATIVE 11/12/2016 1115   NITRITE NEGATIVE 11/12/2016 1115   LEUKOCYTESUR NEGATIVE 11/12/2016 1115   Sepsis Labs: !!!!!!!!!!!!!!!!!!!!!!!!!!!!!!!!!!!!!!!!!!!! @LABRCNTIP (procalcitonin:4,lacticidven:4) ) Recent Results (from the past 240 hour(s))  SARS Coronavirus 2 (CEPHEID - [facility]), Hosp Order     Status: None   Collection Time: 05/19/19 11:42 AM    Specimen: Nasopharyngeal Swab  Result Value Ref Range Status   SARS Coronavirus 2 NEGATIVE NEGATIVE Final    Comment: (NOTE) If result is NEGATIVE SARS-CoV-2 target nucleic acids are NOT DETECTED. The SARS-CoV-2 RNA is generally detectable in upper and lower  respiratory specimens during the acute phase of infection. The lowest  concentration of SARS-CoV-2 viral copies this assay can detect is 250  copies / mL. A negative result does not preclude SARS-CoV-2 infection  and should not be used as the sole basis for treatment or other  patient management decisions.  A negative result may occur with  improper specimen collection / handling, submission of specimen other  than nasopharyngeal swab, presence of viral mutation(s) within the  areas targeted by this assay, and inadequate number of viral copies  (<250 copies / mL). A negative result must be combined with clinical  observations, patient history, and epidemiological information. If result is POSITIVE SARS-CoV-2 target nucleic acids are DETECTED. The SARS-CoV-2 RNA is generally detectable in upper and lower  respiratory specimens dur ing the acute phase of infection.  Positive  results are indicative of active infection with SARS-CoV-2.  Clinical  correlation with patient history and other diagnostic information is  necessary to determine patient infection status.  Positive results do  not rule out bacterial infection or co-infection with other viruses. If result is PRESUMPTIVE POSTIVE SARS-CoV-2 nucleic acids MAY BE PRESENT.   A presumptive positive result was obtained on the submitted specimen  and confirmed on repeat testing.  While 2019 novel coronavirus  (SARS-CoV-2) nucleic acids may be present in the submitted sample  additional confirmatory testing may be necessary for epidemiological  and / or clinical management purposes  to differentiate between  SARS-CoV-2 and other Sarbecovirus currently known to infect humans.  If  clinically indicated additional testing with an alternate test  methodology [phone]([id number]) is advised. The SARS-CoV-2 RNA is generally  detectable in upper and lower respiratory sp ecimens during the acute  phase of infection. The expected result is Negative. Fact Sheet for Patients:  BoilerBrush.com.cyhttps://www.fda.gov/media/136312/download Fact Sheet for Healthcare Providers: https://pope.com/https://www.fda.gov/media/136313/download This test is not yet approved or cleared by the [place] FDA and has been authorized for detection and/or diagnosis of SARS-CoV-2 by FDA under an Emergency Use Authorization (EUA).  This EUA will remain in effect (meaning this test can be used) for the duration of the COVID-19 declaration under Section 564(b)(1) of the Act, 21 U.S.C. section 360bbb-3(b)(1), unless the authorization is terminated or revoked sooner. Performed at [facility], [address] [place], [place] [address]      Radiological Exams on Admission: Dg Chest Port 1 View  Result Date: 05/19/2019 CLINICAL DATA:  Cough, fever, chills and shortness of breath. EXAM: PORTABLE CHEST 1 VIEW COMPARISON:  11/12/2016 FINDINGS: The heart is mildly enlarged but stable. Right atrial and right ventricular pacer wires in good position, unchanged. Right upper and right lower lobe infiltrates are noted. The left lung is clear. No pleural effusions. IMPRESSION: Right upper and right lower lobe pneumonia. Electronically Signed   By: Rudie Meyer M.D.   On: 05/19/2019 12:40   EKG: Independently reviewed. Showed a ventricular paced rhythm with a QTC was prolonged at 540.  Assessment/Plan Active Problems:   Essential hypertension   Atrial fibrillation (HCC)   COPD (chronic obstructive pulmonary disease) (HCC)   Arthropathy   PACEMAKER, PERMANENT   Sinoatrial node dysfunction (HCC)   Pneumonia   Acute respiratory failure with hypoxia (HCC)   Nausea and vomiting   Acute respiratory failure with hypoxia in the  setting of right-sided multilobar pneumonia, present on admission -Patient was hypoxic on room air on admission and improved with 4 L nasal cannula -Admit to inpatient telemetry -Continuous pulse oximetry and maintain O2 saturations greater than 90% -Continue supplemental oxygen via nasal cannula and wean O2 as tolerated -Treatment as below for pneumonia -Repeat chest x-ray in the a.m. -BMP was slightly elevated at 2.149 -Will need home Ambulatory screen prior to discharge  Right-sided multilobar pneumonia including the right upper and right lower lungs, present on admission -Patient was febrile on admission per report but she was only 100.2 as well as tachypneic and had a mild leukocytosis of 11.7 -Given ceftriaxone and azithromycin in the ED and will continue these for community-acquired pneumonia -Check Blood Cx x2, Sputum Cx, Urine Legionella and Urine Strep Ag -Continue with supplemental oxygen via nasal cannula as above and wean as tolerated -SAR-CoV-2 Test NEGATIVE  -We will start flutter valve, incentive spirometry and guaifenesin -Start nebs with Xopenex and Atrovent every 6 scheduled -Will obtain CT of the Chest with Contrast to further delineate -Repeat CXR in AM   Nausea and Vomiting -In the setting of Infection and PNA -C/w Supportive Care and Antiemetics -Currently will place on NS at 75 mL/hr x12 hours  Hyperglycemia -Patient blood glucose was 111 -Continue to Monitor and Repeat CBG in AM -If consistently elevated will need to plaec on Sensitive Novolog SSI   Hypertension -C/w Metoprolol 25 mg po BID -BP was 122/51 on Admission  Arthritis -C/w Acteaminophen  History of sinoatrial node dysfunction and atrial fibrillation and permanent pacemaker placement -C/w BB as Above -EKG Paced; Already has pacer replaced  -Anticoagulation per Pharmacy   COPD -Currently not wheezing so will not order Steroids  -Continue to Monitor and Added Nebs as Above -Has Abx as  Above and Supplemental O2  Supratherapeutic INR -Has a Hx of A Fib -INR was 3.6 on Admission and will currently hold tonight's dose and get Pharmacy to Dose   DVT prophylaxis: Anticoagulated with Coumadin but this is currently going to be held Code Status: FULL CODE Family Communication: No family present at bedside  Disposition Plan: Pending further Clinical Improvement back to baseline and weaning of O2 Consults called: None Admission status: Inpatient Telemetry   Severity of Illness: The appropriate patient status for this patient is INPATIENT. Inpatient status is judged to be reasonable and necessary in order to provide the required intensity of service to ensure the patient's  safety. The patient's presenting symptoms, physical exam findings, and initial radiographic and laboratory data in the context of their chronic comorbidities is felt to place them at high risk for further clinical deterioration. Furthermore, it is not anticipated that the patient will be medically stable for discharge from the hospital within 2 midnights of admission. The following factors support the patient status of inpatient.   " The patient's presenting symptoms include Nausea, Vomiting, Wheezing, and cough. " The worrisome physical exam findings include Diminished breath sounds. " The initial radiographic and laboratory data are worrisome because of PNA " The chronic co-morbidities are listed as above.  * I certify that at the point of admission it is my clinical judgment that the patient will require inpatient hospital care spanning beyond 2 midnights from the point of admission due to high intensity of service, high risk for further deterioration and high frequency of surveillance required.Merlene Laughter*  Danaya Geddis Latif Shalen Petrak, D.O. [facility] PAGER is on AMION  If 7PM-7AM, please contact night-coverage [email or web address] Password [email or web address]  05/19/2019, 6:13 PM

## 2019-05-19 NOTE — Progress Notes (Signed)
Pt demonstrated good understanding of Flutter valve. Pt had good teach back of technique.

## 2019-05-19 NOTE — ED Notes (Signed)
First set of cultures sent to lab. No orders at this time.

## 2019-05-19 NOTE — ED Notes (Signed)
Bed: JF59 Expected date:  Expected time:  Means of arrival:  Comments: EMS89 yo f chills, fever, SHOB

## 2019-05-19 NOTE — ED Triage Notes (Addendum)
Patient arrived via [facility] from home. (Per ems patient lives alone).   Patient C/o cough, fever, and chills. Patient states she vomited once today.   TEMP per EMS-100.2  A/Ox4 Usually ambulatory with walker but, not today with EMS.   Patient placed on 4 litters for comfort @ 95%.  RA when EMS arrived-90%  Patients step son was also at house to lock everything up once EMS left.    Full code per EMS.

## 2019-05-19 NOTE — ED Provider Notes (Addendum)
[facility] DEPT Provider Note   CSN: [id number] Arrival date & time: 05/19/19  1103    History   Chief Complaint Chief Complaint  Patient presents with  . Fever  . Cough    HPI [name] is a 85 y.o. female.     HPI Patient presents with nausea, vomiting, fever, chills. Patient states that she was well until this morning, and soon after awakening about 5 hours ago she noticed that she was feeling generally uncomfortable. After she woke with nausea, she also developed a cough. Per EMS the patient was hypoxic, 90% on room air on their arrival though she improved with 4 L via nasal cannula. No confusion, disorientation, no abdominal pain, no chest pain.  She has a history of A. fib, had pacemaker replacement last year, takes her Coumadin regularly. No other recent medication change, diet change, activity change. No known sick contacts.   Past Medical History:  Diagnosis Date  . Arthritis   . Atrial fibrillation ([place])   . COPD (chronic obstructive pulmonary disease) ([place])   . Hip pain, left   . Hypertension   . Sinoatrial node dysfunction [facility])     Patient Active Problem List   Diagnosis Date Noted  . Hematoma of scalp   . Wheezing   . Acute blood loss anemia 11/13/2016  . Syncope and collapse 11/13/2016  . Fall 11/12/2016  . Annual physical exam 12/20/2014  . Hip fracture ([place]) 05/25/2014  . Encounter for therapeutic drug monitoring 01/20/2014  . H/O bilateral salpingo-oophorectomy 12/11/2013  . 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 Sinoatrial node dysfunction (HCC)   . Encounter for long-term (current) use of anticoagulants 04/30/2011  . PACEMAKER, PERMANENT 04/13/2009  . Essential hypertension 04/12/2009  . Atrial fibrillation ([place]) 04/12/2009  . COPD 04/12/2009  . ARTHRITIS 04/12/2009    Past Surgical History:  Procedure Laterality Date  . BACK SURGERY    . EYE SURGERY Bilateral    cataract removal  . hip  arthoplasty     total  . KNEE ARTHROPLASTY    . PPM GENERATOR CHANGEOUT N/A 08/14/2018   Procedure: PPM GENERATOR CHANGEOUT;  Surgeon: Evans Lance, MD;  Location: [facility];  Service: Cardiovascular;  Laterality: N/A;  . uterine polyp removal       OB History   No obstetric history on file.      Home Medications    Prior to Admission medications   Medication Sig Start Date End Date Taking? Authorizing Provider  furosemide (LASIX) 40 MG tablet Take 40 mg by mouth daily as needed for edema.  08/26/17  Yes [provider]  lisinopril (PRINIVIL,ZESTRIL) 40 MG tablet TAKE 1 TABLET BY MOUTH EVERY DAY 04/05/16  Yes Branch, Alphonse Guild, MD  metoprolol tartrate (LOPRESSOR) 25 MG tablet Take 1 tablet (25 mg total) by mouth 2 (two) times daily. 05/07/16  Yes BranchAlphonse Guild, MD  Multiple Vitamin (MULTIVITAMIN) tablet Take 1 tablet by mouth 4 (four) times a week.    Yes [provider]  warfarin (COUMADIN) 4 MG tablet TAKE 1 AND A HALF TABLETS EVERY DAY EXCEPT 2 TABLETS ON SUNDAYS AND WEDNESDAYS OR AS DIRECTED Patient taking differently: Take 6 mg by mouth every evening.  01/05/19  Yes Branch, Alphonse Guild, MD  albuterol (PROVENTIL HFA;VENTOLIN HFA) 108 (90 Base) MCG/ACT inhaler Inhale 2 puffs into the lungs every 6 (six) hours as needed for wheezing or shortness of breath. Patient not taking: Reported on 05/19/2019 11/15/16  Molt, Bethany, DO    Family History Family History  Problem Relation Age of Onset  . Prostate cancer Father   . Heart failure Father   . Heart disease Father   . Stroke Sister   . Depression Maternal Grandmother     Social History Social History   Tobacco Use  . Smoking status: Never Smoker  . Smokeless tobacco: Never Used  Substance Use Topics  . Alcohol use: No    Alcohol/week: 0.0 standard drinks  . Drug use: No     Allergies   Patient has no known allergies.   Review of Systems Review of Systems  Constitutional:       Per  HPI, otherwise negative  HENT:       Per HPI, otherwise negative  Respiratory:       Per HPI, otherwise negative  Cardiovascular:       Per HPI, otherwise negative  Gastrointestinal: Positive for nausea and vomiting. Negative for abdominal pain.  Endocrine:       Negative aside from HPI  Genitourinary:       Neg aside from HPI   Musculoskeletal:       Per HPI, otherwise negative  Skin: Negative.   Neurological: Negative for syncope.     Physical Exam Updated Vital Signs BP (!) 155/134   Pulse 62   Temp 99.5 F (37.5 C) (Oral)   Resp (!) 23   SpO2 95%   Physical Exam Vitals signs and nursing note reviewed.  Constitutional:      General: She is not in acute distress.    Appearance: She is well-developed.  HENT:     Head: Normocephalic and atraumatic.  Eyes:     Conjunctiva/sclera: Conjunctivae normal.  Cardiovascular:     Rate and Rhythm: Normal rate and regular rhythm.  Pulmonary:     Effort: Pulmonary effort is normal. No respiratory distress.     Breath sounds: Normal breath sounds. No stridor.  Abdominal:     General: There is no distension.     Tenderness: There is no abdominal tenderness. There is no guarding.  Skin:    General: Skin is warm and dry.  Neurological:     Mental Status: She is alert and oriented to person, place, and time.     Cranial Nerves: No cranial nerve deficit.      ED Treatments / Results  Labs (all labs ordered are listed, but only abnormal results are displayed) Labs Reviewed  COMPREHENSIVE METABOLIC PANEL - Abnormal; Notable for the following components:      Result Value   Glucose, Bld 111 (*)    Calcium 8.5 (*)    All other components within normal limits  CBC WITH DIFFERENTIAL/PLATELET - Abnormal; Notable for the following components:   WBC 11.7 (*)    MCV 103.4 (*)    RDW 17.0 (*)    Neutro Abs 10.7 (*)    Lymphs Abs 0.3 (*)    All other components within normal limits  BRAIN NATRIURETIC PEPTIDE - Abnormal; Notable  for the following components:   B Natriuretic Peptide 214.9 (*)    All other components within normal limits  PROTIME-INR - Abnormal; Notable for the following components:   Prothrombin Time 35.0 (*)    INR 3.6 (*)    All other components within normal limits  SARS CORONAVIRUS 2 (HOSPITAL ORDER, PERFORMED IN [facility])  URINALYSIS, ROUTINE W REFLEX MICROSCOPIC    EKG EKG Interpretation  Date/Time:  Tuesday May 19 2019 11:39:31 EDT Ventricular Rate:  72 PR Interval:    QRS Duration: 94 QT Interval:  483 QTC Calculation: 540 R Axis:   -34 Text Interpretation:  Ventricular-paced complexes No further rhythm analysis attempted due to paced rhythm Left axis deviation Nonspecific repol abnormality, diffuse leads Prolonged QT interval Abnormal ECG Confirmed by Gerhard MunchLockwood, Dametrius Sanjuan [phone]([id number]) on 05/19/2019 12:04:11 PM   Radiology Dg Chest Port 1 View  Result Date: 05/19/2019 CLINICAL DATA:  Cough, fever, chills and shortness of breath. EXAM: PORTABLE CHEST 1 VIEW COMPARISON:  11/12/2016 FINDINGS: The heart is mildly enlarged but stable. Right atrial and right ventricular pacer wires in good position, unchanged. Right upper and right lower lobe infiltrates are noted. The left lung is clear. No pleural effusions. IMPRESSION: Right upper and right lower lobe pneumonia. Electronically Signed   By: Rudie MeyerP.  Gallerani M.D.   On: 05/19/2019 12:40    Procedures Procedures (including critical care time)  Medications Ordered in ED Medications  ondansetron (ZOFRAN) injection 4 mg (4 mg Intravenous Given 05/19/19 1137)     Initial Impression / Assessment and Plan / ED Course  I have reviewed the triage vital signs and the nursing notes.  Pertinent labs & imaging results that were available during my care of the patient were reviewed by me and considered in my medical decision making (see chart for details).        1:37 PM Results not available, notable for leukocytosis, x-ray concerning for  pneumonia, coronavirus test is negative. However, patient continues to require additional oxygen supplementation for appropriate saturation, and with this new oxygen requirement, concern for pneumonia, the patient be admitted after initiation of antibiotics here.  1:43 PM  I discussed findings with the patient's power of attorney, daughter Final Clinical Impressions(s) / ED Diagnoses   Final diagnoses:  Pneumonia of right lung due to infectious organism, unspecified part of lung     Gerhard MunchLockwood, Zarinah Oviatt, MD 05/19/19 1338    Gerhard MunchLockwood, Nykeria Mealing, MD 05/19/19 1344

## 2019-05-19 NOTE — ED Notes (Signed)
ED TO INPATIENT HANDOFF REPORT  Name/Age/Gender [name] 85 y.o. female  Code Status Code Status History    Date Active Date Inactive Code Status Order ID Comments User Context   08/14/2018 1236 08/14/2018 1820 Full Code [id number]  Marinus Mawaylor, Gregg W, MD Inpatient   11/12/2016 1826 11/15/2016 2024 Full Code [id number]  Deneise LeverSaraiya, Parth, MD Inpatient   05/25/2014 1909 05/26/2014 1918 Full Code [id number]  Wilson SingerGosrani, Nimish C, MD ED   Advance Care Planning Activity    Advance Directive Documentation     Most Recent Value  Type of Advance Directive  Healthcare Power of Attorney, Living will  Pre-existing out of facility DNR order (yellow form or pink MOST form)  -  "MOST" Form in Place?  -      Home/SNF/Other Home  Chief Complaint fever chills shob  Level of Care/Admitting Diagnosis ED Disposition    ED Disposition Condition Comment   Admit  [facility] [[id number]]  Level of Care: Telemetry [5]  Admit to tele based on following criteria: Monitor QTC interval  Covid Evaluation: Confirmed COVID Negative  Diagnosis: Pneumonia [227785]  Admitting Physician: Marguerita MerlesSHEIKH, OMAIR LATIF [[id number]]  Attending Physician: Marguerita MerlesSHEIKH, OMAIR LATIF [[id number]]  Estimated length of stay: past midnight tomorrow  Certification:: I certify this patient will need inpatient services for at least 2 midnights  PT Class (Do Not Modify): Inpatient [101]  PT Acc Code (Do Not Modify): Private [1]       Medical History Past Medical History:  Diagnosis Date  . Arthritis   . Atrial fibrillation (HCC)   . COPD (chronic obstructive pulmonary disease) (HCC)   . Hip pain, left   . Hypertension   . Sinoatrial node dysfunction (HCC)     Allergies No Known Allergies  IV Location/Drains/Wounds Patient Lines/Drains/Airways Status   Active Line/Drains/Airways    Name:   Placement date:   Placement time:   Site:   Days:   Peripheral IV 05/19/19 Left Antecubital   05/19/19    1110     Antecubital   less than 1   External Urinary Catheter   05/19/19    1152    -   less than 1   Wound / Incision (Open or Dehisced) 11/13/16 Laceration Head Posterior   11/13/16    0745    Head   917          Labs/Imaging Results for orders placed or performed during the hospital encounter of 05/19/19 (from the past 48 hour(s))  Comprehensive metabolic panel     Status: Abnormal   Collection Time: 05/19/19 11:42 AM  Result Value Ref Range   Sodium 140 135 - 145 mmol/L   Potassium 3.7 3.5 - 5.1 mmol/L   Chloride 104 98 - 111 mmol/L   CO2 27 22 - 32 mmol/L   Glucose, Bld 111 (H) 70 - 99 mg/dL   BUN 19 8 - 23 mg/dL   Creatinine, Ser 8.410.56 0.44 - 1.00 mg/dL   Calcium 8.5 (L) 8.9 - 10.3 mg/dL   Total Protein 7.3 6.5 - 8.1 g/dL   Albumin 3.5 3.5 - 5.0 g/dL   AST 34 15 - 41 U/L   ALT 23 0 - 44 U/L   Alkaline Phosphatase 71 38 - 126 U/L   Total Bilirubin 0.8 0.3 - 1.2 mg/dL   GFR calc non Af Amer >60 >60 mL/min   GFR calc Af Amer >60 >60 mL/min   Anion gap 9 5 - 15  Comment: Performed at [facility], [address] [place], [place] [address]  CBC WITH DIFFERENTIAL     Status: Abnormal   Collection Time: 05/19/19 11:42 AM  Result Value Ref Range   WBC 11.7 (H) 4.0 - 10.5 K/uL   RBC 3.87 3.87 - 5.11 MIL/uL   Hemoglobin 12.2 12.0 - 15.0 g/dL   HCT 60.440.0 54.036.0 - 98.146.0 %   MCV 103.4 (H) 80.0 - 100.0 fL   MCH 31.5 26.0 - 34.0 pg   MCHC 30.5 30.0 - 36.0 g/dL   RDW 19.117.0 (H) 47.811.5 - 29.515.5 %   Platelets 208 150 - 400 K/uL   nRBC 0.0 0.0 - 0.2 %   Neutrophils Relative % 91 %   Neutro Abs 10.7 (H) 1.7 - 7.7 K/uL   Lymphocytes Relative 3 %   Lymphs Abs 0.3 (L) 0.7 - 4.0 K/uL   Monocytes Relative 6 %   Monocytes Absolute 0.7 0.1 - 1.0 K/uL   Eosinophils Relative 0 %   Eosinophils Absolute 0.0 0.0 - 0.5 K/uL   Basophils Relative 0 %   Basophils Absolute 0.0 0.0 - 0.1 K/uL   Immature Granulocytes 0 %   Abs Immature Granulocytes 0.03 0.00 - 0.07 K/uL    Comment:  Performed at [facility], [address] [place], [place] [address]  Brain natriuretic peptide     Status: Abnormal   Collection Time: 05/19/19 11:42 AM  Result Value Ref Range   B Natriuretic Peptide 214.9 (H) 0.0 - 100.0 pg/mL    Comment: Performed at [facility], [address] [place], [place] [address]  Protime-INR     Status: Abnormal   Collection Time: 05/19/19 11:42 AM  Result Value Ref Range   Prothrombin Time 35.0 (H) 11.4 - 15.2 seconds   INR 3.6 (H) 0.8 - 1.2    Comment: (NOTE) INR goal varies based on device and disease states. Performed at [facility], [address] [place], [place] [address]   SARS Coronavirus 2 (CEPHEID - Performed in [facility] hospital lab), Hosp Order     Status: None   Collection Time: 05/19/19 11:42 AM   Specimen: Nasopharyngeal Swab  Result Value Ref Range   SARS Coronavirus 2 NEGATIVE NEGATIVE    Comment: (NOTE) If result is NEGATIVE SARS-CoV-2 target nucleic acids are NOT DETECTED. The SARS-CoV-2 RNA is generally detectable in upper and lower  respiratory specimens during the acute phase of infection. The lowest  concentration of SARS-CoV-2 viral copies this assay can detect is 250  copies / mL. A negative result does not preclude SARS-CoV-2 infection  and should not be used as the sole basis for treatment or other  patient management decisions.  A negative result may occur with  improper specimen collection / handling, submission of specimen other  than nasopharyngeal swab, presence of viral mutation(s) within the  areas targeted by this assay, and inadequate number of viral copies  (<250 copies / mL). A negative result must be combined with clinical  observations, patient history, and epidemiological information. If result is POSITIVE SARS-CoV-2 target nucleic acids are DETECTED. The SARS-CoV-2 RNA is generally detectable in upper and lower  respiratory specimens dur ing  the acute phase of infection.  Positive  results are indicative of active infection with SARS-CoV-2.  Clinical  correlation with patient history and other diagnostic information is  necessary to determine patient infection status.  Positive results do  not rule out bacterial infection  or co-infection with other viruses. If result is PRESUMPTIVE POSTIVE SARS-CoV-2 nucleic acids MAY BE PRESENT.   A presumptive positive result was obtained on the submitted specimen  and confirmed on repeat testing.  While 2019 novel coronavirus  (SARS-CoV-2) nucleic acids may be present in the submitted sample  additional confirmatory testing may be necessary for epidemiological  and / or clinical management purposes  to differentiate between  SARS-CoV-2 and other Sarbecovirus currently known to infect humans.  If clinically indicated additional testing with an alternate test  methodology [phone]([id number]) is advised. The SARS-CoV-2 RNA is generally  detectable in upper and lower respiratory sp ecimens during the acute  phase of infection. The expected result is Negative. Fact Sheet for Patients:  BoilerBrush.com.cyhttps://www.fda.gov/media/136312/download Fact Sheet for Healthcare Providers: https://pope.com/https://www.fda.gov/media/136313/download This test is not yet approved or cleared by the [place] FDA and has been authorized for detection and/or diagnosis of SARS-CoV-2 by FDA under an Emergency Use Authorization (EUA).  This EUA will remain in effect (meaning this test can be used) for the duration of the COVID-19 declaration under Section 564(b)(1) of the Act, 21 U.S.C. section 360bbb-3(b)(1), unless the authorization is terminated or revoked sooner. Performed at [facility], [address] [place], [place] [address]    Dg Chest Port 1 View  Result Date: 05/19/2019 CLINICAL DATA:  Cough, fever, chills and shortness of breath. EXAM: PORTABLE CHEST 1 VIEW COMPARISON:  11/12/2016 FINDINGS: The heart is mildly  enlarged but stable. Right atrial and right ventricular pacer wires in good position, unchanged. Right upper and right lower lobe infiltrates are noted. The left lung is clear. No pleural effusions. IMPRESSION: Right upper and right lower lobe pneumonia. Electronically Signed   By: Rudie MeyerP.  Gallerani M.D.   On: 05/19/2019 12:40    Pending Labs Unresulted Labs (From admission, onward)    Start     Ordered   05/20/19 0500  Protime-INR  Daily,   R     05/19/19 1523   05/20/19 0500  CBC  Every 72 hours,   R     05/19/19 1523   05/19/19 1110  Urinalysis, Routine w reflex microscopic  ONCE - STAT,   STAT     05/19/19 1110   Signed and Held  HIV antibody (Routine Screening)  Once,   R     Signed and Held   Signed and Held  Magnesium  Add-on,   R     Signed and Held   Signed and Held  Phosphorus  Add-on,   R     Signed and Held   Signed and Held  Comprehensive metabolic panel  Tomorrow morning,   R     Signed and Held   Medical illustratorigned and Held  CBC  Tomorrow morning,   R     Signed and Held   Signed and Held  Legionella Pneumophila Serogp 1 Ur Ag  Once,   R     Signed and Held   Signed and Held  Strep pneumoniae urinary antigen  Once,   R     Signed and Held   Signed and Held  Sputum culture  (Non-severe pneumonia (non-ICU care) in adult without resistant organism risk factors.)  Once,   R     Signed and Held   Signed and Held  Expectorated sputum assessment w rflx to resp cult  Once,   R     Signed and Held          Vitals/Pain Today's Vitals  05/19/19 1400 05/19/19 1448 05/19/19 1500 05/19/19 1530  BP: 133/61  (!) 105/93 131/71  Pulse: 65 62 63 65  Resp: (!) 21 18 19  (!) 22  Temp:      TempSrc:      SpO2: 100% 100% 99% 99%  PainSc:        Isolation Precautions No active isolations  Medications Medications  azithromycin (ZITHROMAX) 500 mg in sodium chloride 0.9 % 250 mL IVPB (500 mg Intravenous New Bag/Given 05/19/19 1518)  ondansetron (ZOFRAN) injection 4 mg (4 mg Intravenous Given  05/19/19 1137)  cefTRIAXone (ROCEPHIN) 1 g in sodium chloride 0.9 % 100 mL IVPB (0 g Intravenous Stopped 05/19/19 1516)    Mobility walks with device

## 2019-05-19 NOTE — ED Notes (Signed)
Patient aware we need urine sample. Pure wick placed.

## 2019-05-19 NOTE — Progress Notes (Signed)
ANTICOAGULATION CONSULT NOTE  Pharmacy Consult for warfarin Indication: Afib  No Known Allergies  Patient Measurements:    Vital Signs: Temp: 99.5 F ([name].5 C) (07/07 1130) Temp Source: Oral (07/07 1130) BP: 105/93 (07/07 1500) Pulse Rate: 63 (07/07 1500)  Labs: Recent Labs    05/19/19 1142  HGB 12.2  HCT 40.0  PLT 208  LABPROT 35.0*  INR 3.6*  CREATININE 0.56    CrCl cannot be calculated (Unknown ideal weight.).   Medical History: Past Medical History:  Diagnosis Date  . Arthritis   . Atrial fibrillation ([place])   . COPD (chronic obstructive pulmonary disease) ([place])   . Hip pain, left   . Hypertension   . Sinoatrial node dysfunction (HCC)     Medications:  (Not in a hospital admission)  Scheduled:   PRN:   Assessment: [name] with PMH Afib on warfarin PTA, SA node dysfunction s/p pacemaker, COPD, admitted 7/7 for CAP; Pharmacy to continue warfarin while admitted   Baseline INR elevated (consistent with acute infection)  Prior anticoagulation: warfarin 6 mg daily, last dose 7/6  Significant events:  Today, 05/19/2019:  CBC: WNL  INR SUPRAtherapeutic  Major drug interactions: broad spectrum abx  No bleeding issues per nursing  No diet orders yet (still in ED)  Goal of Therapy: INR 2-3 [facility] flowsheet current as of 04/08/19)  Plan:  Hold warfarin tonight  Daily INR  CBC at least q72 hr while on warfarin  Monitor for signs of bleeding or thrombosis   Reuel Boom, PharmD, BCPS [phone] 05/19/2019, 3:17 PM

## 2019-05-20 ENCOUNTER — Inpatient Hospital Stay (HOSPITAL_COMMUNITY): Payer: Medicare HMO

## 2019-05-20 DIAGNOSIS — I480 Paroxysmal atrial fibrillation: Secondary | ICD-10-CM

## 2019-05-20 DIAGNOSIS — J181 Lobar pneumonia, unspecified organism: Secondary | ICD-10-CM

## 2019-05-20 LAB — CBC WITH DIFFERENTIAL/PLATELET
Abs Immature Granulocytes: 0.09 10*3/uL — ABNORMAL HIGH (ref 0.00–0.07)
Basophils Absolute: 0 10*3/uL (ref 0.0–0.1)
Basophils Relative: 0 %
Eosinophils Absolute: 0.1 10*3/uL (ref 0.0–0.5)
Eosinophils Relative: 1 %
HCT: 31.7 % — ABNORMAL LOW (ref 36.0–46.0)
Hemoglobin: 9.9 g/dL — ABNORMAL LOW (ref 12.0–15.0)
Immature Granulocytes: 1 %
Lymphocytes Relative: 9 %
Lymphs Abs: 1.2 10*3/uL (ref 0.7–4.0)
MCH: 32.8 pg (ref 26.0–34.0)
MCHC: 31.2 g/dL (ref 30.0–36.0)
MCV: 105 fL — ABNORMAL HIGH (ref 80.0–100.0)
Monocytes Absolute: 1.1 10*3/uL — ABNORMAL HIGH (ref 0.1–1.0)
Monocytes Relative: 8 %
Neutro Abs: 11.6 10*3/uL — ABNORMAL HIGH (ref 1.7–7.7)
Neutrophils Relative %: 81 %
Platelets: 168 10*3/uL (ref 150–400)
RBC: 3.02 MIL/uL — ABNORMAL LOW (ref 3.87–5.11)
RDW: 17.1 % — ABNORMAL HIGH (ref 11.5–15.5)
WBC: 14.1 10*3/uL — ABNORMAL HIGH (ref 4.0–10.5)
nRBC: 0 % (ref 0.0–0.2)

## 2019-05-20 LAB — RESPIRATORY PANEL BY PCR

## 2019-05-20 LAB — COMPREHENSIVE METABOLIC PANEL
ALT: 20 U/L (ref 0–44)
AST: 30 U/L (ref 15–41)
Albumin: 2.8 g/dL — ABNORMAL LOW (ref 3.5–5.0)
Alkaline Phosphatase: 49 U/L (ref 38–126)
Anion gap: 4 — ABNORMAL LOW (ref 5–15)
BUN: 17 mg/dL (ref 8–23)
CO2: 28 mmol/L (ref 22–32)
Calcium: 7.9 mg/dL — ABNORMAL LOW (ref 8.9–10.3)
Chloride: 105 mmol/L (ref 98–111)
Creatinine, Ser: 0.52 mg/dL (ref 0.44–1.00)
GFR calc Af Amer: >60 mL/min (ref >60)
GFR calc non Af Amer: >60 mL/min (ref >60)
Glucose, Bld: 101 mg/dL — ABNORMAL HIGH (ref 70–99)
Potassium: 3.7 mmol/L (ref 3.5–5.1)
Sodium: 137 mmol/L (ref 135–145)
Total Bilirubin: 1 mg/dL (ref 0.3–1.2)
Total Protein: 5.8 g/dL — ABNORMAL LOW (ref 6.5–8.1)

## 2019-05-20 LAB — URINALYSIS, ROUTINE W REFLEX MICROSCOPIC
Bilirubin Urine: NEGATIVE
Glucose, UA: NEGATIVE mg/dL
Hgb urine dipstick: NEGATIVE
Ketones, ur: NEGATIVE mg/dL
Leukocytes,Ua: NEGATIVE
Nitrite: NEGATIVE
Protein, ur: NEGATIVE mg/dL
Specific Gravity, Urine: 1.035 — ABNORMAL HIGH (ref 1.005–1.030)
pH: 5 (ref 5.0–8.0)

## 2019-05-20 LAB — PROTIME-INR
INR: 3.7 — ABNORMAL HIGH (ref 0.8–1.2)
Prothrombin Time: 35.8 seconds — ABNORMAL HIGH (ref 11.4–15.2)

## 2019-05-20 LAB — MRSA PCR SCREENING: MRSA by PCR: NEGATIVE

## 2019-05-20 LAB — MAGNESIUM: Magnesium: 2 mg/dL (ref 1.7–2.4)

## 2019-05-20 LAB — PHOSPHORUS: Phosphorus: 3 mg/dL (ref 2.5–4.6)

## 2019-05-20 LAB — STREP PNEUMONIAE URINARY ANTIGEN: Strep Pneumo Urinary Antigen: NEGATIVE

## 2019-05-20 LAB — HIV ANTIBODY (ROUTINE TESTING W REFLEX): HIV Screen 4th Generation wRfx: NONREACTIVE

## 2019-05-20 MED ORDER — LEVALBUTEROL HCL 0.63 MG/3ML IN NEBU
0.6300 mg | INHALATION_SOLUTION | Freq: Three times a day (TID) | RESPIRATORY_TRACT | Status: DC
  Administered 2019-05-20 – 2019-05-22 (×5): 0.63 mg via RESPIRATORY_TRACT
  Filled 2019-05-20 (×5): qty 3

## 2019-05-20 MED ORDER — GUAIFENESIN ER 600 MG PO TB12
600.0000 mg | ORAL_TABLET | Freq: Two times a day (BID) | ORAL | Status: DC
  Administered 2019-05-20 – 2019-05-29 (×18): 600 mg via ORAL
  Filled 2019-05-20 (×19): qty 1

## 2019-05-20 MED ORDER — SENNOSIDES-DOCUSATE SODIUM 8.6-50 MG PO TABS
1.0000 | ORAL_TABLET | Freq: Two times a day (BID) | ORAL | Status: DC
  Administered 2019-05-20 – 2019-06-01 (×21): 1 via ORAL
  Filled 2019-05-20 (×23): qty 1

## 2019-05-20 MED ORDER — IPRATROPIUM BROMIDE 0.02 % IN SOLN
0.5000 mg | Freq: Three times a day (TID) | RESPIRATORY_TRACT | Status: DC
  Administered 2019-05-20 – 2019-05-22 (×5): 0.5 mg via RESPIRATORY_TRACT
  Filled 2019-05-20 (×5): qty 2.5

## 2019-05-20 MED ORDER — METOPROLOL TARTRATE 25 MG PO TABS
12.5000 mg | ORAL_TABLET | Freq: Two times a day (BID) | ORAL | Status: DC
  Administered 2019-05-21 (×2): 12.5 mg via ORAL
  Filled 2019-05-20 (×2): qty 1

## 2019-05-20 NOTE — Evaluation (Signed)
Occupational Therapy Evaluation Patient Details Name: [name] H Knezevic MRN: [id number] DOB: 09-13-1923 Today's Date: 05/20/2019    History of Present Illness 85 year old female admitted for nausea, vomiting and chills. Dx'd with pna.  PMH:  arthritis, endometrial CA, SA node dsyfunction, pacemaker, COPD, and HTN   Clinical Impression   Pt was admitted for the above. At baseline, she is mod I and has "step children" assist with outside work and shopping. She currently needs min A for adls and mobility. Will follow in acute setting with supervision level goals     Follow Up Recommendations  SNF;Supervision/Assistance - 24 hour(if home, HHOT)    Equipment Recommendations  None recommended by OT    Recommendations for Other Services       Precautions / Restrictions Precautions Precautions: Fall Restrictions Weight Bearing Restrictions: No      Mobility Bed Mobility Overal bed mobility: Needs Assistance Bed Mobility: Supine to Sit     Supine to sit: Supervision     General bed mobility comments: extra time and effort  Transfers Overall transfer level: Needs assistance Equipment used: Rolling walker (2 wheeled) Transfers: Sit to/from Stand Sit to Stand: Min assist         General transfer comment: assist to stand and stabilize    Balance                                           ADL either performed or assessed with clinical judgement   ADL Overall ADL's : Needs assistance/impaired Eating/Feeding: Independent   Grooming: Set up   Upper Body Bathing: Set up   Lower Body Bathing: Minimal assistance   Upper Body Dressing : Minimal assistance   Lower Body Dressing: Moderate assistance   Toilet Transfer: Minimal assistance   Toileting- Clothing Manipulation and Hygiene: Minimal assistance;Moderate assistance         General ADL Comments: performed ADL. Pt has stress incontinence when she stands. 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 Does not wear 02 at baseline    Exercises     Shoulder Instructions      Home Living Family/patient expects to be discharged to:: Private residence Living Arrangements: Alone Available Help at Discharge: Family;Available PRN/intermittently               Bathroom Shower/Tub: Walk-in shower   Bathroom Toilet: Handicapped height     Home Equipment: Shower seat;Bedside commode          Prior Functioning/Environment Level of Independence: Independent with assistive device(s)        Comments: step children assist with outside work, shopping  OT Problem List: Decreased strength;Decreased activity tolerance;Impaired balance (sitting and/or standing);Decreased knowledge of use of DME or AE;Cardiopulmonary status limiting activity      OT Treatment/Interventions: Self-care/ADL training;DME and/or AE instruction;Energy conservation;Patient/family education;Balance training;Therapeutic activities    OT Goals(Current goals can be found in the care plan section) Acute Rehab OT Goals Patient Stated Goal: none stated; agreeable to therapy OT Goal Formulation: With patient Time For Goal Achievement: 06/03/19 Potential to Achieve Goals: Good ADL Goals Pt Will Perform Grooming: with supervision;standing Pt Will Transfer to Toilet: with supervision;ambulating;bedside commode Pt  Will Perform Toileting - Clothing Manipulation and hygiene: with supervision;sit to/from stand Additional ADL Goal #1: pt will perform adl at set up/supervision level Additional ADL Goal #2: pt will initiate at least one rest break as needed without cues  OT Frequency: Min 2X/week   Barriers to D/C:            Co-evaluation              AM-PAC OT "6 Clicks" Daily Activity     Outcome Measure Help from another person eating meals?: None Help from another person taking care of personal grooming?: A Little Help from another person toileting, which includes using toliet, bedpan, or urinal?: A Little Help from another person bathing (including washing, rinsing, drying)?: A Little Help from another person to put on and taking off regular upper body clothing?: A Little Help from another person to put on and taking off regular lower body clothing?: A Lot 6 Click Score: 18   End of Session    Activity Tolerance: Patient tolerated treatment well Patient left: in chair;with call bell/phone within reach;with chair alarm set  OT Visit Diagnosis: Muscle weakness (generalized) (M62.81);Unsteadiness on feet (R26.81)                Time: 8003-4917 OT Time Calculation (min): 20 min Charges:  OT General Charges $OT Visit: 1 Visit OT Evaluation $OT Eval Low Complexity: Sylvester, OTR/L Acute Rehabilitation Services [phone] [facility] pager [phone] office 05/20/2019  [place] 05/20/2019, 1:11 PM

## 2019-05-20 NOTE — Plan of Care (Signed)
Plan of care reviewed and discussed with the patient. 

## 2019-05-20 NOTE — Progress Notes (Signed)
PROGRESS NOTE  [name] H Tinch ZOX:[id number] DOB: [birth year] DOA: 05/19/2019 PCP: [name], Edward, MD  HPI/Recap of past 24 hours:  Remain oxygen dependent, tmax 99.5 Wbc worsening INR continue to be supratherapeutic  She denies n/v, no cough, no fever  Assessment/Plan: Active Problems:   Essential hypertension   Atrial fibrillation (HCC)   COPD (chronic obstructive pulmonary disease) (HCC)   Arthropathy   PACEMAKER, PERMANENT   Sinoatrial node dysfunction (HCC)   Pneumonia   Acute respiratory failure with hypoxia (HCC)   Nausea and vomiting   Lobar pneumonia with acute hypoxic respiratory failure Reports n/v, aspiration pneumonia? Ct chest with multilobar pneumonia on the right Blood culture in process Urine strep/ legionella antigen in process SARS -COV2 screening negative, respiratory viral panel negative, mrsa screening negative Speech eval recommends regular diet/thin liquid  H/o COPD? No wheezing on exam, not home o2 dependent  Afib with SSS s/p pacemaker On coumadin with supratherapeutic INR bp low normal, decrease lopressor to 12.5mg  bid with holding parameters  Chronic diastolic chf Hold lasix  FTT, she lives alone, uses a walker, PT/OT recommends snf   Code Status: full  Family Communication: patient   Disposition Plan: needs to wean o2, need INR improvement, home health vs snf in 1-2 days   Consultants:  none  Procedures:  none  Antibiotics:  Rocephin/zithro   Objective: BP (!) 98/47 (BP Location: Left Arm)    Pulse 63    Temp 98.7 F (37.1 C) (Oral)    Resp 17    Ht 5\' 5"  (1.651 m)    Wt 68.4 kg    SpO2 95%    BMI 25.08 kg/m   Intake/Output Summary (Last 24 hours) at 05/20/2019 1026 Last data filed at 05/20/2019 0700 Gross per 24 hour  Intake 1431.92 ml  Output 550 ml  Net 881.92 ml   Filed Weights   05/19/19 1706  Weight: 68.4 kg    Exam: Patient is examined daily including today on 05/20/2019, exams remain the same as of  yesterday except that has changed    General:  Frail elderly, NAD, aaox3  Cardiovascular: RRR  Respiratory: CTABL  Abdomen: Soft/ND/NT, positive BS  Musculoskeletal: No Edema  Neuro: alert, oriented   Data Reviewed: Basic Metabolic Panel: Recent Labs  Lab 05/19/19 1142 05/19/19 1800 05/20/19 0544  NA 140  --  137  K 3.7  --  3.7  CL 104  --  105  CO2 27  --  28  GLUCOSE 111*  --  101*  BUN 19  --  17  CREATININE 0.56  --  0.52  CALCIUM 8.5*  --  7.9*  MG  --  2.0 2.0  PHOS  --  3.9 3.0   Liver Function Tests: Recent Labs  Lab 05/19/19 1142 05/20/19 0544  AST 34 30  ALT 23 20  ALKPHOS 71 49  BILITOT 0.8 1.0  PROT 7.3 5.8*  ALBUMIN 3.5 2.8*   No results for input(s): LIPASE, AMYLASE in the last 168 hours. No results for input(s): AMMONIA in the last 168 hours. CBC: Recent Labs  Lab 05/19/19 1142 05/20/19 0544  WBC 11.7* 14.1*  NEUTROABS 10.7* 11.6*  HGB 12.2 9.9*  HCT 40.0 31.7*  MCV 103.4* 105.0*  PLT 208 168   Cardiac Enzymes:   No results for input(s): CKTOTAL, CKMB, CKMBINDEX, TROPONINI in the last 168 hours. BNP (last 3 results) Recent Labs    05/19/19 1142  BNP 214.9*    ProBNP (last 3  results) No results for input(s): PROBNP in the last 8760 hours.  CBG: No results for input(s): GLUCAP in the last 168 hours.  Recent Results (from the past 240 hour(s))  SARS Coronavirus 2 (CEPHEID - Performed in [facility] lab), Hosp Order     Status: None   Collection Time: 05/19/19 11:42 AM   Specimen: Nasopharyngeal Swab  Result Value Ref Range Status   SARS Coronavirus 2 NEGATIVE NEGATIVE Final    Comment: (NOTE) If result is NEGATIVE SARS-CoV-2 target nucleic acids are NOT DETECTED. The SARS-CoV-2 RNA is generally detectable in upper and lower  respiratory specimens during the acute phase of infection. The lowest  concentration of SARS-CoV-2 viral copies this assay can detect is 250  copies / mL. A negative result does not  preclude SARS-CoV-2 infection  and should not be used as the sole basis for treatment or other  patient management decisions.  A negative result may occur with  improper specimen collection / handling, submission of specimen other  than nasopharyngeal swab, presence of viral mutation(s) within the  areas targeted by this assay, and inadequate number of viral copies  (<250 copies / mL). A negative result must be combined with clinical  observations, patient history, and epidemiological information. If result is POSITIVE SARS-CoV-2 target nucleic acids are DETECTED. The SARS-CoV-2 RNA is generally detectable in upper and lower  respiratory specimens dur ing the acute phase of infection.  Positive  results are indicative of active infection with SARS-CoV-2.  Clinical  correlation with patient history and other diagnostic information is  necessary to determine patient infection status.  Positive results do  not rule out bacterial infection or co-infection with other viruses. If result is PRESUMPTIVE POSTIVE SARS-CoV-2 nucleic acids MAY BE PRESENT.   A presumptive positive result was obtained on the submitted specimen  and confirmed on repeat testing.  While 2019 novel coronavirus  (SARS-CoV-2) nucleic acids may be present in the submitted sample  additional confirmatory testing may be necessary for epidemiological  and / or clinical management purposes  to differentiate between  SARS-CoV-2 and other Sarbecovirus currently known to infect humans.  If clinically indicated additional testing with an alternate test  methodology [phone] is advised. The SARS-CoV-2 RNA is generally  detectable in upper and lower respiratory sp ecimens during the acute  phase of infection. The expected result is Negative. Fact Sheet for Patients:  BoilerBrush.com.cyhttps://www.fda.gov/media/136312/download Fact Sheet for Healthcare Providers: https://pope.com/https://www.fda.gov/media/136313/download This test is not yet approved or cleared by  the [place] FDA and has been authorized for detection and/or diagnosis of SARS-CoV-2 by FDA under an Emergency Use Authorization (EUA).  This EUA will remain in effect (meaning this test can be used) for the duration of the COVID-19 declaration under Section 564(b)(1) of the Act, 21 U.S.C. section 360bbb-3(b)(1), unless the authorization is terminated or revoked sooner. Performed at [facility], [address] [place], [place] [address]      Studies: Dg Chest 2 View  Result Date: 05/20/2019 CLINICAL DATA:  Nausea, vomiting, pneumonia. EXAM: CHEST - 2 VIEW COMPARISON:  Radiograph May 20, 2019. FINDINGS: Stable cardiomegaly. Left-sided pacemaker is unchanged in position. No pneumothorax or pleural effusion is noted. Left lung is clear. Stable right lung opacity is noted consistent with pneumonia. Bony thorax is unremarkable. IMPRESSION: Stable right-sided pneumonia. Electronically Signed   By: Lupita RaiderJames  Green Jr M.D.   On: 05/20/2019 10:03   Ct Chest W Contrast  Result Date: 05/19/2019 CLINICAL DATA:  Fever and  cough EXAM: CT CHEST WITH CONTRAST TECHNIQUE: Multidetector CT imaging of the chest was performed during intravenous contrast administration. CONTRAST:  29mL OMNIPAQUE IOHEXOL 300 MG/ML  SOLN COMPARISON:  Chest x-ray from earlier in the same day. FINDINGS: Cardiovascular: Thoracic aorta demonstrates some mild atherosclerotic calcifications without aneurysmal dilatation or dissection. Mild cardiac enlargement is seen. Pacing device is again noted. The pulmonary artery shows no large central pulmonary embolus. Mediastinum/Nodes: Thoracic inlet is within normal limits. No significant hilar or mediastinal adenopathy is noted. The esophagus as visualized is within normal limits. Lungs/Pleura: The lungs are well aerated bilaterally but again demonstrate patchy infiltrate throughout the right upper and lower lobes similar to that seen on prior plain film examination.  Involvement of the right middle lobe is noted as well. No sizable effusion is seen. Left lung shows no focal infiltrate. Upper Abdomen: Visualized upper abdomen shows evidence of prior cholecystectomy. No acute abnormality is seen. Musculoskeletal: Degenerative changes of the thoracic spine are noted. Old rib fractures are seen bilaterally. IMPRESSION: Multifocal pneumonia throughout the right upper, middle and lower lobes. These changes are similar to that seen on prior plain film examination. Aortic Atherosclerosis (ICD10-I70.0). Electronically Signed   By: Inez Catalina M.D.   On: 05/19/2019 20:04   Dg Chest Port 1 View  Result Date: 05/20/2019 CLINICAL DATA:  85 year old female with shortness of breath. Negative for COVID-19. Yesterday. EXAM: PORTABLE CHEST 1 VIEW COMPARISON:  05/19/2019 and earlier. FINDINGS: Portable AP semi upright view at 0507 hours. Confluent right lung airspace opacity seen to be multilobar on the CT yesterday. Right lung ventilation is stable. No pleural effusion. Stable cardiomegaly and mediastinal contours. Left chest cardiac pacemaker. The left lung remains spared. Partially visible surgical clips and spinal hardware in the upper abdomen. IMPRESSION: 1. Stable multilobar right lung pneumonia since yesterday. 2. No new cardiopulmonary abnormality. Electronically Signed   By: Genevie Ann M.D.   On: 05/20/2019 07:35   Dg Chest Port 1 View  Result Date: 05/19/2019 CLINICAL DATA:  Cough, fever, chills and shortness of breath. EXAM: PORTABLE CHEST 1 VIEW COMPARISON:  11/12/2016 FINDINGS: The heart is mildly enlarged but stable. Right atrial and right ventricular pacer wires in good position, unchanged. Right upper and right lower lobe infiltrates are noted. The left lung is clear. No pleural effusions. IMPRESSION: Right upper and right lower lobe pneumonia. Electronically Signed   By: Marijo Sanes M.D.   On: 05/19/2019 12:40    Scheduled Meds:  guaiFENesin  600 mg Oral BID    ipratropium  0.5 mg Nebulization QID   levalbuterol  0.63 mg Nebulization QID   metoprolol tartrate  25 mg Oral BID   multivitamin with minerals  1 tablet Oral Daily   senna-docusate  1 tablet Oral BID    Continuous Infusions:  azithromycin     cefTRIAXone (ROCEPHIN)  IV       Time spent: 25mins I have personally reviewed and interpreted on  05/20/2019 daily labs, tele strips, imagings as discussed above under date review session and assessment and plans.  I reviewed all nursing notes, pharmacy notes, vitals, pertinent old records  I have discussed plan of care as described above with RN , patient on 05/20/2019   Florencia Reasons MD, PhD  [facility] Pager [phone]. If 7PM-7AM, please contact night-coverage at [email or web address], password [facility] 05/20/2019, 10:26 AM  LOS: 1 day

## 2019-05-20 NOTE — Evaluation (Signed)
Clinical/Bedside Swallow Evaluation Patient Details  Name: [name] MRN: [id number] Date of Birth: 1923-09-11  Today's Date: 05/20/2019 Time: SLP Start Time (ACUTE ONLY): 1040 SLP Stop Time (ACUTE ONLY): 1055 SLP Time Calculation (min) (ACUTE ONLY): 15 min  Past Medical History:  Past Medical History:  Diagnosis Date  . Arthritis   . Atrial fibrillation (HCC)   . COPD (chronic obstructive pulmonary disease) (HCC)   . Hip pain, left   . Hypertension   . Sinoatrial node dysfunction (HCC)    Past Surgical History:  Past Surgical History:  Procedure Laterality Date  . BACK SURGERY    . EYE SURGERY Bilateral    cataract removal  . hip arthoplasty     total  . KNEE ARTHROPLASTY    . PPM GENERATOR CHANGEOUT N/A 08/14/2018   Procedure: PPM GENERATOR CHANGEOUT;  Surgeon: Marinus Mawaylor, Gregg W, MD;  [facility];  Service: Cardiovascular;  Laterality: N/A;  . uterine polyp removal     HPI:  Patient is a 85 y.o. female with PMH: arthritis, hip fracture, endometrial carcinoma, permanent pacemaker, afib on Coumadin, COPD, HTN, who presented to hospital with c/o nausea and vomitting associated with chills  and SOB. CXR revealed right sided upper lobe and lower lobe PNA, Covid testing was negative.   Assessment / Plan / Recommendation Clinical Impression  Patient presents with an oropharyngeal swallow that appears to be [facility], with no overt s/s of aspiration or penetration with thin liquids via straw sips. Swallow initiation was timely and pharyngeal contraction and laryngeal elevation were both WFL. This BSE cannot r/o silent aspiration but presentation is not highly suspicious for this. SLP Visit Diagnosis: Dysphagia, unspecified (R13.10)    Aspiration Risk  Mild aspiration risk    Diet Recommendation Regular;Thin liquid   Liquid Administration via: Straw Medication Administration: Whole meds with liquid Supervision: Patient able to self feed Postural Changes: Seated  upright at 90 degrees    Other  Recommendations Oral Care Recommendations: Oral care BID   Follow up Recommendations None      Frequency and Duration   N/A         Prognosis   N/A     Swallow Study   General Date of Onset: 05/19/19 HPI: Patient is a 85 y.o. female with PMH: arthritis, hip fracture, endometrial carcinoma, permanent pacemaker, afib on Coumadin, COPD, HTN, who presented to hospital with c/o nausea and vomitting associated with chills  and SOB. CXR revealed right sided upper lobe and lower lobe PNA, Covid testing was negative. Type of Study: Bedside Swallow Evaluation Previous Swallow Assessment: N/A Diet Prior to this Study: Regular;Thin liquids Temperature Spikes Noted: No History of Recent Intubation: No Behavior/Cognition: Alert;Cooperative;Pleasant mood Oral Cavity Assessment: Within Functional Limits Oral Care Completed by SLP: No Oral Cavity - Dentition: Missing dentition Vision: Functional for self-feeding Self-Feeding Abilities: Able to feed self Patient Positioning: Upright in bed Baseline Vocal Quality: Normal Volitional Cough: Strong Volitional Swallow: Able to elicit    Oral/Motor/Sensory Function Overall Oral Motor/Sensory Function: Within functional limits   Ice Chips     Thin Liquid Thin Liquid: Within functional limits Presentation: Straw;Self Fed Other Comments: No overt s/s of aspiration or penetration with multiple swallows via straw sips of water. Swallow initiation and laryngeal elevation were [facility]    Nectar Thick     Honey Thick     Puree Puree: Not tested   Solid     Solid: Not tested      Fraser DinPreston,  Alexis Frock 05/20/2019,1:05 PM    Sonia Baller, MA, CCC-SLP Speech Therapy [facility] Acute Rehab Pager: [phone]

## 2019-05-20 NOTE — Evaluation (Signed)
Physical Therapy Evaluation Patient Details Name: [name] MRN: [id number] DOB: Mar 23, 1923 Today's Date: 05/20/2019   History of Present Illness  85 year old female admitted for nausea, vomiting and chills. Dx'd with pna.  PMH:  arthritis, endometrial CA, SA node dsyfunction, pacemaker, COPD, and HTN  Clinical Impression  Pt admitted as above and presenting with functional mobility limitations 2* generalized weakness, limited endurance and ambulatory balance deficits.  Pt hopes to progress to return home but may have to consider ST SNF level rehab dependent on acute stay progress and level of assist that can be arranged at home.    Follow Up Recommendations Home health PT;Supervision/Assistance - 24 hour;SNF    Equipment Recommendations  None recommended by PT    Recommendations for Other Services       Precautions / Restrictions Precautions Precautions: Fall Restrictions Weight Bearing Restrictions: No      Mobility  Bed Mobility Overal bed mobility: Needs Assistance Bed Mobility: Supine to Sit     Supine to sit: Supervision     General bed mobility comments: sup level with OT OOB  Transfers Overall transfer level: Needs assistance Equipment used: Rolling walker (2 wheeled) Transfers: Sit to/from Stand Sit to Stand: Min assist         General transfer comment: assist to stand and stabilize  Ambulation/Gait Ambulation/Gait assistance: Min assist;Min guard Gait Distance (Feet): 80 Feet Assistive device: Rolling walker (2 wheeled) Gait Pattern/deviations: Step-to pattern;Step-through pattern;Decreased step length - right;Decreased step length - left;Shuffle;Trunk flexed Gait velocity: decr   General Gait Details: cues for posture, position from RW and safety awareness  Stairs            Wheelchair Mobility    Modified Rankin (Stroke Patients Only)       Balance Overall balance assessment: Needs assistance Sitting-balance support: No upper  extremity supported;Feet supported Sitting balance-Leahy Scale: Good     Standing balance support: Bilateral upper extremity supported Standing balance-Leahy Scale: Fair                               Pertinent Vitals/Pain Pain Assessment: No/denies pain    Home Living Family/patient expects to be discharged to:: Private residence Living Arrangements: Alone Available Help at Discharge: Family;Available PRN/intermittently Type of Home: House Home Access: Stairs to enter Entrance Stairs-Rails: Right Entrance Stairs-Number of Steps: 3 Home Layout: One level Home Equipment: Shower seat;Bedside commode;Walker - 2 wheels;Cane - quad      Prior Function Level of Independence: Independent with assistive device(s)         Comments: step children assist with outside work, shopping     Hand Dominance   Dominant Hand: Right    Extremity/Trunk Assessment   Upper Extremity Assessment Upper Extremity Assessment: Generalized weakness    Lower Extremity Assessment Lower Extremity Assessment: Generalized weakness    Cervical / Trunk Assessment Cervical / Trunk Assessment: Kyphotic  Communication   Communication: HOH  Cognition Arousal/Alertness: Awake/alert Behavior During Therapy: WFL for tasks assessed/performed Overall Cognitive Status: Within Functional Limits for tasks assessed                                        General Comments General comments (skin integrity, edema, etc.): on 3 liters 02; VSS. Does not wear 02 at baseline    Exercises     Assessment/Plan  PT Assessment Patient needs continued PT services  PT Problem List Decreased strength;Decreased activity tolerance;Decreased balance;Decreased mobility;Decreased knowledge of use of DME;Decreased safety awareness       PT Treatment Interventions DME instruction;Gait training;Stair training;Functional mobility training;Therapeutic activities;Therapeutic  exercise;Patient/family education    PT Goals (Current goals can be found in the Care Plan section)  Acute Rehab PT Goals Patient Stated Goal: none stated; agreeable to therapy PT Goal Formulation: With patient Time For Goal Achievement: 06/03/19 Potential to Achieve Goals: Fair    Frequency Min 3X/week   Barriers to discharge Decreased caregiver support intermittent assist of step children    Co-evaluation               AM-PAC PT "6 Clicks" Mobility  Outcome Measure Help needed turning from your back to your side while in a flat bed without using bedrails?: None Help needed moving from lying on your back to sitting on the side of a flat bed without using bedrails?: A Little Help needed moving to and from a bed to a chair (including a wheelchair)?: A Little Help needed standing up from a chair using your arms (e.g., wheelchair or bedside chair)?: A Little Help needed to walk in hospital room?: A Little Help needed climbing 3-5 steps with a railing? : A Lot 6 Click Score: 18    End of Session Equipment Utilized During Treatment: Gait belt;Oxygen Activity Tolerance: Patient tolerated treatment well;Patient limited by fatigue Patient left: in chair;with call bell/phone within reach;with chair alarm set Nurse Communication: Mobility status PT Visit Diagnosis: Difficulty in walking, not elsewhere classified (R26.2);Muscle weakness (generalized) (M62.81)    Time: [id number] PT Time Calculation (min) (ACUTE ONLY): 25 min   Charges:   PT Evaluation $PT Eval Low Complexity: 1 Low          Mauro KaufmannHunter Tomasina Keasling PT Acute Rehabilitation Services Pager [phone] Office [phone]   Ajah Vanhoose 05/20/2019, 3:26 PM

## 2019-05-20 NOTE — Progress Notes (Signed)
ANTICOAGULATION CONSULT NOTE  Pharmacy Consult for warfarin Indication: Afib  No Known Allergies  Patient Measurements: Height: 5\' 5"  (165.1 cm) Weight: 150 lb 11.2 oz (68.4 kg) IBW/kg (Calculated) : 57  Vital Signs: Temp: 98.7 F (37.1 C) (07/08 0453) Temp Source: Oral (07/08 0453) BP: 98/47 (07/08 0453) Pulse Rate: 63 (07/08 0453)  Labs: Recent Labs    05/19/19 1142 05/20/19 0544  HGB 12.2 9.9*  HCT 40.0 31.7*  PLT 208 168  LABPROT 35.0* 35.8*  INR 3.6* 3.7*  CREATININE 0.56 0.52   Estimated Creatinine Clearance: 41.8 mL/min (by C-G formula based on SCr of 0.52 mg/dL).  Medical History: Past Medical History:  Diagnosis Date  . Arthritis   . Atrial fibrillation ([place])   . COPD (chronic obstructive pulmonary disease) ([place])   . Hip pain, left   . Hypertension   . Sinoatrial node dysfunction (HCC)     Medications:  Scheduled:  . ipratropium  0.5 mg Nebulization QID  . levalbuterol  0.63 mg Nebulization QID  . metoprolol tartrate  25 mg Oral BID  . multivitamin with minerals  1 tablet Oral Daily   Assessment: 37 yoF with PMH Afib on warfarin PTA, SA node dysfunction s/p pacemaker, COPD, admitted 7/7 for CAP; Pharmacy to continue warfarin while admitted   Baseline INR elevated (consistent with acute infection)  Prior anticoagulation: warfarin 6 mg daily, last dose 7/6  Today, 05/20/2019:  Hgb decr 12.2 > 9.9, Plt remain wnl  INR remains supratherapeutic(3.7)  Major drug interactions: broad spectrum abx may incr INR  Regular diet  Goal of Therapy: INR 2-3 [facility] flowsheet current as of 04/08/19)  Plan:  No Warfarin today  Daily INR  CBC at least q72 hr while on warfarin  Monitor for signs of bleeding or thrombosis  Minda Ditto PharmD Pager [phone] 05/20/2019, 7:58 AM

## 2019-05-21 LAB — CBC WITH DIFFERENTIAL/PLATELET
Abs Immature Granulocytes: 0.03 10*3/uL (ref 0.00–0.07)
Basophils Absolute: 0 10*3/uL (ref 0.0–0.1)
Basophils Relative: 0 %
Eosinophils Absolute: 0.3 10*3/uL (ref 0.0–0.5)
Eosinophils Relative: 3 %
HCT: 33 % — ABNORMAL LOW (ref 36.0–46.0)
Hemoglobin: 9.9 g/dL — ABNORMAL LOW (ref 12.0–15.0)
Immature Granulocytes: 0 %
Lymphocytes Relative: 8 %
Lymphs Abs: 0.7 10*3/uL (ref 0.7–4.0)
MCH: 31.6 pg (ref 26.0–34.0)
MCHC: 30 g/dL (ref 30.0–36.0)
MCV: 105.4 fL — ABNORMAL HIGH (ref 80.0–100.0)
Monocytes Absolute: 0.9 10*3/uL (ref 0.1–1.0)
Monocytes Relative: 10 %
Neutro Abs: 7.2 10*3/uL (ref 1.7–7.7)
Neutrophils Relative %: 79 %
Platelets: 138 10*3/uL — ABNORMAL LOW (ref 150–400)
RBC: 3.13 MIL/uL — ABNORMAL LOW (ref 3.87–5.11)
RDW: 16.8 % — ABNORMAL HIGH (ref 11.5–15.5)
WBC: 9.2 10*3/uL (ref 4.0–10.5)
nRBC: 0 % (ref 0.0–0.2)

## 2019-05-21 LAB — BASIC METABOLIC PANEL
Anion gap: 9 (ref 5–15)
BUN: 13 mg/dL (ref 8–23)
CO2: 27 mmol/L (ref 22–32)
Calcium: 8.2 mg/dL — ABNORMAL LOW (ref 8.9–10.3)
Chloride: 101 mmol/L (ref 98–111)
Creatinine, Ser: 0.54 mg/dL (ref 0.44–1.00)
GFR calc Af Amer: >60 mL/min (ref >60)
GFR calc non Af Amer: >60 mL/min (ref >60)
Glucose, Bld: 108 mg/dL — ABNORMAL HIGH (ref 70–99)
Potassium: 3.7 mmol/L (ref 3.5–5.1)
Sodium: 137 mmol/L (ref 135–145)

## 2019-05-21 LAB — PROTIME-INR
INR: 2.3 — ABNORMAL HIGH (ref 0.8–1.2)
Prothrombin Time: 24.9 seconds — ABNORMAL HIGH (ref 11.4–15.2)

## 2019-05-21 LAB — GLUCOSE, CAPILLARY: Glucose-Capillary: 99 mg/dL (ref 70–99)

## 2019-05-21 LAB — LACTIC ACID, PLASMA: Lactic Acid, Venous: 0.9 mmol/L (ref 0.5–1.9)

## 2019-05-21 LAB — MAGNESIUM: Magnesium: 2 mg/dL (ref 1.7–2.4)

## 2019-05-21 MED ORDER — METOPROLOL TARTRATE 25 MG PO TABS
25.0000 mg | ORAL_TABLET | Freq: Two times a day (BID) | ORAL | Status: DC
  Administered 2019-05-21 – 2019-06-02 (×15): 25 mg via ORAL
  Filled 2019-05-21 (×23): qty 1

## 2019-05-21 MED ORDER — WARFARIN SODIUM 6 MG PO TABS
6.0000 mg | ORAL_TABLET | Freq: Once | ORAL | Status: AC
  Administered 2019-05-21: 6 mg via ORAL
  Filled 2019-05-21: qty 1

## 2019-05-21 MED ORDER — AMOXICILLIN-POT CLAVULANATE 500-125 MG PO TABS
1.0000 | ORAL_TABLET | Freq: Two times a day (BID) | ORAL | Status: DC
  Administered 2019-05-21 – 2019-05-24 (×6): 500 mg via ORAL
  Filled 2019-05-21 (×7): qty 1

## 2019-05-21 MED ORDER — WARFARIN - PHARMACIST DOSING INPATIENT
Freq: Every day | Status: DC
  Administered 2019-05-23 – 2019-05-30 (×2)

## 2019-05-21 NOTE — Progress Notes (Addendum)
PROGRESS NOTE  [name] ONG:[id number] DOB: 28-Jun-1923 DOA: 05/19/2019 PCP: [name], Edward, MD  HPI/Recap of past 24 hours:  o2 dropped to 83% while ambulating on room air, wbc normalized, t max 98.4 Occasional intermittent cough, states if chronic She denies chest pain, no edema INR now back to therapeutic range She declined  snf placement  Assessment/Plan: Active Problems:   Essential hypertension   Atrial fibrillation (HCC)   COPD (chronic obstructive pulmonary disease) (HCC)   Arthropathy   PACEMAKER, PERMANENT   Sinoatrial node dysfunction (HCC)   Lobar pneumonia (HCC)   Acute respiratory failure with hypoxia (HCC)   Nausea and vomiting   Lobar pneumonia with acute hypoxic respiratory failure Reports n/v, aspiration pneumonia? Ct chest with multilobar pneumonia on the right Blood culture no growth Urine strep antigen negative, urine legionella antigen in process SARS -COV2 screening negative, respiratory viral panel negative, mrsa screening negative Speech eval recommends regular diet/thin liquid She has improved on rocephin/zithromax, will change to augmentin , plan for total of 5 days abx treatment  H/o COPD? No wheezing on exam, not home o2 dependent  Afib with SSS s/p pacemaker Mostly paced rhythm, she is improving, will d/c tele On coumadin with supratherapeutic INR bp low normal, decrease lopressor to 12.5mg  bid with holding parameters initially, bp improving, increase lopressor back to home dose,   Chronic diastolic chf Hold lasix  HTN:  bp low normal on presentation, lopressor does decreased initially, now bp is improving, increase lopressor back to home dose, Lisinopril and lasix held since hospitalization, continue hold  Macrocytic anemia: mcv 105, hgb around 10 No sign of bleeding Will check iron level, b12, folate, tsh, retic  FTT, she lives alone, uses a walker, PT/OT recommends snf, she declined snf, she agrees to home health, will  arrange home health   Code Status: full  Family Communication: patient   Disposition Plan: needs to wean o2,  home with home health , hopefully on 7/10   Consultants:  none  Procedures:  none  Antibiotics:  Rocephin/zithro from admission to 7/9  augmentin from 7/9 to     Objective: BP (!) 141/71 (BP Location: Left Arm)   Pulse 63   Temp 97.8 F (36.6 C) (Oral)   Resp (!) 22   Ht 5\' 5"  (1.651 m)   Wt 71 kg   SpO2 100%   BMI 26.04 kg/m   Intake/Output Summary (Last 24 hours) at 05/21/2019 1653 Last data filed at 05/21/2019 0900 Gross per 24 hour  Intake 590 ml  Output 1300 ml  Net -710 ml   Filed Weights   05/19/19 1706 05/21/19 0600  Weight: 68.4 kg 71 kg    Exam: Patient is examined daily including today on 05/21/2019, exams remain the same as of yesterday except that has changed    General:  Frail elderly, NAD, aaox3  Cardiovascular: RRR  Respiratory: CTABL  Abdomen: Soft/ND/NT, positive BS  Musculoskeletal: No Edema  Neuro: alert, oriented   Data Reviewed: Basic Metabolic Panel: Recent Labs  Lab 05/19/19 1142 05/19/19 1800 05/20/19 0544 05/21/19 0501  NA 140  --  137 137  K 3.7  --  3.7 3.7  CL 104  --  105 101  CO2 27  --  28 27  GLUCOSE 111*  --  101* 108*  BUN 19  --  17 13  CREATININE 0.56  --  0.52 0.54  CALCIUM 8.5*  --  7.9* 8.2*  MG  --  2.0 2.0  2.0  PHOS  --  3.9 3.0  --    Liver Function Tests: Recent Labs  Lab 05/19/19 1142 05/20/19 0544  AST 34 30  ALT 23 20  ALKPHOS 71 49  BILITOT 0.8 1.0  PROT 7.3 5.8*  ALBUMIN 3.5 2.8*   No results for input(s): LIPASE, AMYLASE in the last 168 hours. No results for input(s): AMMONIA in the last 168 hours. CBC: Recent Labs  Lab 05/19/19 1142 05/20/19 0544 05/21/19 0501  WBC 11.7* 14.1* 9.2  NEUTROABS 10.7* 11.6* 7.2  HGB 12.2 9.9* 9.9*  HCT 40.0 31.7* 33.0*  MCV 103.4* 105.0* 105.4*  PLT 208 168 138*   Cardiac Enzymes:   No results for input(s): CKTOTAL, CKMB,  CKMBINDEX, TROPONINI in the last 168 hours. BNP (last 3 results) Recent Labs    05/19/19 1142  BNP 214.9*    ProBNP (last 3 results) No results for input(s): PROBNP in the last 8760 hours.  CBG: Recent Labs  Lab 05/21/19 0746  GLUCAP 99    Recent Results (from the past 240 hour(s))  SARS Coronavirus 2 (CEPHEID - Performed in [facility] hospital lab), Hosp Order     Status: None   Collection Time: 05/19/19 11:42 AM   Specimen: Nasopharyngeal Swab  Result Value Ref Range Status   SARS Coronavirus 2 NEGATIVE NEGATIVE Final    Comment: (NOTE) If result is NEGATIVE SARS-CoV-2 target nucleic acids are NOT DETECTED. The SARS-CoV-2 RNA is generally detectable in upper and lower  respiratory specimens during the acute phase of infection. The lowest  concentration of SARS-CoV-2 viral copies this assay can detect is 250  copies / mL. A negative result does not preclude SARS-CoV-2 infection  and should not be used as the sole basis for treatment or other  patient management decisions.  A negative result may occur with  improper specimen collection / handling, submission of specimen other  than nasopharyngeal swab, presence of viral mutation(s) within the  areas targeted by this assay, and inadequate number of viral copies  (<250 copies / mL). A negative result must be combined with clinical  observations, patient history, and epidemiological information. If result is POSITIVE SARS-CoV-2 target nucleic acids are DETECTED. The SARS-CoV-2 RNA is generally detectable in upper and lower  respiratory specimens dur ing the acute phase of infection.  Positive  results are indicative of active infection with SARS-CoV-2.  Clinical  correlation with patient history and other diagnostic information is  necessary to determine patient infection status.  Positive results do  not rule out bacterial infection or co-infection with other viruses. If result is PRESUMPTIVE POSTIVE SARS-CoV-2 nucleic  acids MAY BE PRESENT.   A presumptive positive result was obtained on the submitted specimen  and confirmed on repeat testing.  While 2019 novel coronavirus  (SARS-CoV-2) nucleic acids may be present in the submitted sample  additional confirmatory testing may be necessary for epidemiological  and / or clinical management purposes  to differentiate between  SARS-CoV-2 and other Sarbecovirus currently known to infect humans.  If clinically indicated additional testing with an alternate test  methodology [phone]) is advised. The SARS-CoV-2 RNA is generally  detectable in upper and lower respiratory sp ecimens during the acute  phase of infection. The expected result is Negative. Fact Sheet for Patients:  StrictlyIdeas.no Fact Sheet for Healthcare Providers: BankingDealers.co.za This test is not yet approved or cleared by the [place] FDA and has been authorized for detection and/or diagnosis of SARS-CoV-2 by FDA under an Emergency  Use Authorization (EUA).  This EUA will remain in effect (meaning this test can be used) for the duration of the COVID-19 declaration under Section 564(b)(1) of the Act, 21 U.S.C. section 360bbb-3(b)(1), unless the authorization is terminated or revoked sooner. Performed at [facility], [address] [place], [place] [address]   Culture, blood (routine x 2)     Status: None (Preliminary result)   Collection Time: 05/19/19  7:01 PM   Specimen: BLOOD  Result Value Ref Range Status   Specimen Description   Final    BLOOD LEFT ANTECUBITAL Performed at [facility], [address] [place], [place] [address]    Special Requests   Final    BOTTLES DRAWN AEROBIC ONLY Blood Culture adequate volume Performed at [facility], [address] [place], [place] [address]    Culture   Final    NO GROWTH 1 DAY Performed at [facility], [address] [place], [place] [address]    Report Status PENDING  Incomplete  Culture, blood (routine x 2)     Status: None (Preliminary result)   Collection Time: 05/19/19  7:01 PM   Specimen: BLOOD LEFT HAND  Result Value Ref Range Status   Specimen Description   Final    BLOOD LEFT HAND Performed at [facility], [address] [place], [place] [address]    Special Requests   Final    BOTTLES DRAWN AEROBIC ONLY Blood Culture adequate volume Performed at [facility], [address] [place], [place] [address]    Culture   Final    NO GROWTH 1 DAY Performed at [facility], [address] [place], [place] [address]    Report Status PENDING  Incomplete  Respiratory Panel by PCR     Status: None   Collection Time: 05/20/19  2:12 PM   Specimen: Nasopharyngeal Swab; Respiratory  Result Value Ref Range Status   Adenovirus NOT DETECTED NOT DETECTED Final   Coronavirus 229E NOT DETECTED NOT DETECTED Final    Comment: (NOTE) The Coronavirus on the Respiratory Panel, DOES NOT test for the novel  Coronavirus (2019 nCoV)    Coronavirus HKU1 NOT DETECTED NOT DETECTED Final   Coronavirus NL63 NOT DETECTED NOT DETECTED Final   Coronavirus OC43 NOT DETECTED NOT DETECTED Final   Metapneumovirus NOT DETECTED NOT DETECTED Final   Rhinovirus / Enterovirus NOT DETECTED NOT DETECTED Final   Influenza A NOT DETECTED NOT DETECTED Final   Influenza B NOT DETECTED NOT DETECTED Final   Parainfluenza Virus 1 NOT DETECTED NOT DETECTED Final   Parainfluenza Virus 2 NOT DETECTED NOT DETECTED Final   Parainfluenza Virus 3 NOT DETECTED NOT DETECTED Final   Parainfluenza Virus 4 NOT DETECTED NOT DETECTED Final   Respiratory Syncytial Virus NOT DETECTED NOT DETECTED Final   Bordetella pertussis NOT DETECTED NOT DETECTED Final   Chlamydophila pneumoniae NOT DETECTED NOT DETECTED Final   Mycoplasma pneumoniae NOT DETECTED NOT DETECTED Final    Comment: Performed at  [facility], [address] [place], [place] [address]  MRSA PCR Screening     Status: None   Collection Time: 05/20/19  2:12 PM   Specimen: Nasal Mucosa; Nasopharyngeal  Result Value Ref Range Status   MRSA by PCR NEGATIVE NEGATIVE Final    Comment:        The GeneXpert MRSA Assay (FDA approved for NASAL specimens only), is one component of a  comprehensive MRSA colonization surveillance program. It is not intended to diagnose MRSA infection nor to guide or monitor treatment for MRSA infections. Performed at [facility], [address] [place], [place] [address]      Studies: No results found.  Scheduled Meds: . amoxicillin-clavulanate  1 tablet Oral BID  . guaiFENesin  600 mg Oral BID  . ipratropium  0.5 mg Nebulization TID  . levalbuterol  0.63 mg Nebulization TID  . metoprolol tartrate  12.5 mg Oral BID  . multivitamin with minerals  1 tablet Oral Daily  . senna-docusate  1 tablet Oral BID  . Warfarin - Pharmacist Dosing Inpatient   Does not apply q1800    Continuous Infusions:    Time spent: 35mins I have personally reviewed and interpreted on  05/21/2019 daily labs, tele strips, imagings as discussed above under date review session and assessment and plans.  I reviewed all nursing notes, pharmacy notes, vitals, pertinent old records  I have discussed plan of care as described above with RN , patient on 05/21/2019   Albertine GratesFang Joal Eakle MD, PhD  [facility] Pager [phone]. If 7PM-7AM, please contact night-coverage at [email or web address], password [email or web address] 05/21/2019, 4:53 PM  LOS: 2 days

## 2019-05-21 NOTE — Progress Notes (Signed)
ANTICOAGULATION CONSULT NOTE  Pharmacy Consult for warfarin Indication: Afib  No Known Allergies  Patient Measurements: Height: 5\' 5"  (165.1 cm) Weight: 156 lb 8 oz (71 kg) IBW/kg (Calculated) : 57  Vital Signs: Temp: 97.9 F (36.6 C) (07/09 0600) Temp Source: Oral (07/09 0600) BP: 132/66 (07/09 0600) Pulse Rate: 63 (07/09 0600)  Labs: Recent Labs    05/19/19 1142 05/20/19 0544 05/21/19 0501  HGB 12.2 9.9* 9.9*  HCT 40.0 31.7* 33.0*  PLT 208 168 138*  LABPROT 35.0* 35.8* 24.9*  INR 3.6* 3.7* 2.3*  CREATININE 0.56 0.52 0.54   Estimated Creatinine Clearance: 42.5 mL/min (by C-G formula based on SCr of 0.54 mg/dL).  Medical History: Past Medical History:  Diagnosis Date  . Arthritis   . Atrial fibrillation ([place])   . COPD (chronic obstructive pulmonary disease) ([place])   . Hip pain, left   . Hypertension   . Sinoatrial node dysfunction (HCC)     Medications:  Scheduled:  . guaiFENesin  600 mg Oral BID  . ipratropium  0.5 mg Nebulization TID  . levalbuterol  0.63 mg Nebulization TID  . metoprolol tartrate  12.5 mg Oral BID  . multivitamin with minerals  1 tablet Oral Daily  . senna-docusate  1 tablet Oral BID   Assessment: 53 yoF with PMH Afib on warfarin PTA, SA node dysfunction s/p pacemaker, COPD, admitted 7/7 for CAP; Pharmacy to continue warfarin while admitted   Baseline INR elevated (consistent with acute infection)  Prior anticoagulation: warfarin 6 mg daily, last dose 7/6  Today, 05/21/2019:  Hgb decr 12.2 > 9.9, Plt sl decr 138  INR now in therapeutic range at 2.3  Major drug interactions: broad spectrum abx may incr INR  Regular diet - intake improved  Goal of Therapy: INR 2-3 [facility] flowsheet current as of 04/08/19)  Plan:  Warfarin 6mg  today at 1200 (give earlier than usual 1800 schedule)  Daily INR  CBC at least q72 hr while on warfarin  Monitor for signs of bleeding or thrombosis  Minda Ditto PharmD Pager [phone] 05/21/2019,  10:20 AM

## 2019-05-21 NOTE — TOC Initial Note (Signed)
Transition of Care [facility]) - Initial/Assessment Note    Patient Details  Name: [name] MRN: [id number] Date of Birth: 1923-04-02  Transition of Care [facility]) CM/SW Contact:    Cami Delawder, Marjie Skiff, RN Phone Number: 05/21/2019, 11:18 AM  Clinical Narrative:                 Pt declines SNF at this time. She wants to go back home. Her son and daughter both check on her daily. She is on 02 in the hospital. Unsure if she will need it on discharge.  Expected Discharge Plan: [place] Barriers to Discharge: Continued Medical Work up   Patient Goals and CMS Choice   CMS Medicare.gov Compare Post Acute Care list provided to:: Other (Comment Required)(website down) Choice offered to / list presented to : Patient  Expected Discharge Plan and Services Expected Discharge Plan: [place]   Discharge Planning Services: CM Consult Post Acute Care Choice: [place] arrangements for the past 2 months: Single Family Home Expected Discharge Date: (unknown)                         HH Arranged: RN, PT, OT, Nurse's Aide, Social Work [employer] Agency: [place] Date [facility] Agency Contacted: 05/21/19 Time [place]: 1117 Representative spoke with at [place]  Prior Living Arrangements/Services Living arrangements for the past 2 months: [place] with:: Self Patient language and need for interpreter reviewed:: Yes Do you feel safe going back to the place where you live?: Yes      Need for Family Participation in Patient Care: Yes (Comment) Care giver support system in place?: Yes (comment)   Criminal Activity/Legal Involvement Pertinent to Current Situation/Hospitalization: No - Comment as needed  Activities of Daily Living Home Assistive Devices/Equipment: Bedside commode/3-in-1, Eyeglasses, Shower chair without back, Walker (specify type), Other (Comment)(front wheeled walker, walk-in shower) ADL Screening  (condition at time of admission) Patient's cognitive ability adequate to safely complete daily activities?: Yes Is the patient deaf or have difficulty hearing?: Yes Does the patient have difficulty seeing, even when wearing glasses/contacts?: No Does the patient have difficulty concentrating, remembering, or making decisions?: No Patient able to express need for assistance with ADLs?: Yes Does the patient have difficulty dressing or bathing?: Yes Independently performs ADLs?: No Communication: Independent Dressing (OT): Needs assistance Is this a change from baseline?: Change from baseline, expected to last >3 days Grooming: Needs assistance Is this a change from baseline?: Change from baseline, expected to last >3 days Feeding: Needs assistance Is this a change from baseline?: Change from baseline, expected to last >3 days Bathing: Needs assistance Is this a change from baseline?: Change from baseline, expected to last >3 days Toileting: Dependent Is this a change from baseline?: Change from baseline, expected to last >3days In/Out Bed: Dependent Is this a change from baseline?: Change from baseline, expected to last >3 days Walks in Home: Dependent Is this a change from baseline?: Change from baseline, expected to last >3 days Does the patient have difficulty walking or climbing stairs?: Yes(secondary to weakness) Weakness of Legs: Both Weakness of Arms/Hands: None  Permission Sought/Granted Permission sought to share information with : Facility Art therapist granted to share information with : Yes, Verbal Permission Granted     Permission granted to share info w AGENCY: [place]        Emotional Assessment Appearance:: Appears stated  age Attitude/Demeanor/Rapport: Engaged Affect (typically observed): Appropriate Orientation: : Oriented to Self, Oriented to Place, Oriented to  Time, Oriented to Situation Alcohol / Substance Use: Not Applicable Psych  Involvement: No (comment)  Admission diagnosis:  Pneumonia of right lung due to infectious organism, unspecified part of lung [J18.9] Pneumonia [J18.9] Patient Active Problem List   Diagnosis Date Noted  . Lobar pneumonia (HCC) 05/19/2019  . Acute respiratory failure with hypoxia (HCC) 05/19/2019  . Nausea and vomiting 05/19/2019  . Hematoma of scalp   . Wheezing   . Acute blood loss anemia 11/13/2016  . Syncope and collapse 11/13/2016  . Fall 11/12/2016  . Annual physical exam 12/20/2014  . Hip fracture (HCC) 05/25/2014  . Encounter for therapeutic drug monitoring 01/20/2014  . H/O bilateral salpingo-oophorectomy 12/11/2013  . 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 Sinoatrial node dysfunction (HCC)   . Encounter for long-term (current) use of anticoagulants 04/30/2011  . PACEMAKER, PERMANENT 04/13/2009  . Essential hypertension 04/12/2009  . Atrial fibrillation (HCC) 04/12/2009  . COPD (chronic obstructive pulmonary disease) (HCC) 04/12/2009  . Arthropathy 04/12/2009   PCP:  [name], Edward, MD Pharmacy:   [facility] [phone][id number] - [place], [place] - [address] [facility] [address] [place] [address] Phone: [phone] Fax: [phone]  [facility] [place] - [id number] [place] [address] [place] [id number] Phone: [phone] Fax: [phone]     Social Determinants of Health (SDOH) Interventions    Readmission Risk Interventions No flowsheet data found.

## 2019-05-21 NOTE — Progress Notes (Signed)
Physical Therapy Treatment Patient Details Name: [name] MRN: [id number] DOB: [birth year] Today's Date: 05/21/2019    History of Present Illness 85 year old female admitted for nausea, vomiting and chills. Dx'd with pna.  PMH:  arthritis, endometrial CA, SA node dsyfunction, pacemaker, COPD, and HTN    PT Comments    Pt ambulated 80' with RW, SaO2 88-90% on room air, distance limited by 2/4 dyspnea. If pt DCs home she would benefit from HHPT and increased support from family.   Follow Up Recommendations  Home health PT;Supervision/Assistance - 24 hour;SNF     Equipment Recommendations  None recommended by PT    Recommendations for Other Services       Precautions / Restrictions Precautions Precautions: Fall Precaution Comments: monitor O2 Restrictions Weight Bearing Restrictions: No    Mobility  Bed Mobility               General bed mobility comments: up in recliner  Transfers Overall transfer level: Needs assistance Equipment used: Rolling walker (2 wheeled) Transfers: Sit to/from Stand Sit to Stand: Min guard         General transfer comment: good hand placement  Ambulation/Gait Ambulation/Gait assistance: Min guard Gait Distance (Feet): 80 Feet Assistive device: Rolling walker (2 wheeled) Gait Pattern/deviations: Step-to pattern;Step-through pattern;Decreased step length - right;Decreased step length - left;Shuffle;Trunk flexed Gait velocity: decr   General Gait Details: cues for posture, position from RW and safety awareness; SaO2 88-90% on room air walking, 2/4 dyspnea, distance limited by fatigue   Stairs             Wheelchair Mobility    Modified Rankin (Stroke Patients Only)       Balance Overall balance assessment: Needs assistance Sitting-balance support: No upper extremity supported;Feet supported Sitting balance-Leahy Scale: Good     Standing balance support: Bilateral upper extremity supported Standing  balance-Leahy Scale: Fair                              Cognition Arousal/Alertness: Awake/alert Behavior During Therapy: WFL for tasks assessed/performed Overall Cognitive Status: Within Functional Limits for tasks assessed                                        Exercises      General Comments        Pertinent Vitals/Pain Pain Assessment: No/denies pain    Home Living                      Prior Function            PT Goals (current goals can now be found in the care plan section) Acute Rehab PT Goals Patient Stated Goal: none stated; agreeable to therapy PT Goal Formulation: With patient Time For Goal Achievement: 06/03/19 Potential to Achieve Goals: Fair Progress towards PT goals: Progressing toward goals    Frequency    Min 3X/week      PT Plan Current plan remains appropriate    Co-evaluation              AM-PAC PT "6 Clicks" Mobility   Outcome Measure  Help needed turning from your back to your side while in a flat bed without using bedrails?: None Help needed moving from lying on your back to sitting on the side of a flat  bed without using bedrails?: A Little Help needed moving to and from a bed to a chair (including a wheelchair)?: A Little Help needed standing up from a chair using your arms (e.g., wheelchair or bedside chair)?: A Little Help needed to walk in hospital room?: A Little Help needed climbing 3-5 steps with a railing? : A Lot 6 Click Score: 18    End of Session Equipment Utilized During Treatment: Gait belt Activity Tolerance: Patient tolerated treatment well;Patient limited by fatigue Patient left: in chair;with call bell/phone within reach;with chair alarm set Nurse Communication: Mobility status PT Visit Diagnosis: Difficulty in walking, not elsewhere classified (R26.2);Muscle weakness (generalized) (M62.81)     Time: [id number] PT Time Calculation (min) (ACUTE ONLY): 16 min  Charges:   $Gait Training: 8-22 mins                     Ralene BatheUhlenberg, Lavonta Tillis Kistler PT 05/21/2019  Acute Rehabilitation Services Pager [phone] Office [phone]

## 2019-05-21 NOTE — Progress Notes (Signed)
NT ambulated with patient in hallway.   RA: 83% oxygen level  1L: 93% oxygen maintained

## 2019-05-22 DIAGNOSIS — R627 Adult failure to thrive: Secondary | ICD-10-CM

## 2019-05-22 LAB — BASIC METABOLIC PANEL
Anion gap: 8 (ref 5–15)
BUN: 12 mg/dL (ref 8–23)
CO2: 27 mmol/L (ref 22–32)
Calcium: 8.2 mg/dL — ABNORMAL LOW (ref 8.9–10.3)
Chloride: 99 mmol/L (ref 98–111)
Creatinine, Ser: 0.52 mg/dL (ref 0.44–1.00)
GFR calc Af Amer: >60 mL/min (ref >60)
GFR calc non Af Amer: >60 mL/min (ref >60)
Glucose, Bld: 105 mg/dL — ABNORMAL HIGH (ref 70–99)
Potassium: 4 mmol/L (ref 3.5–5.1)
Sodium: 134 mmol/L — ABNORMAL LOW (ref 135–145)

## 2019-05-22 LAB — LEGIONELLA PNEUMOPHILA SEROGP 1 UR AG: L. pneumophila Serogp 1 Ur Ag: NEGATIVE

## 2019-05-22 LAB — PROTIME-INR
INR: 1.7 — ABNORMAL HIGH (ref 0.8–1.2)
Prothrombin Time: 19.6 seconds — ABNORMAL HIGH (ref 11.4–15.2)

## 2019-05-22 LAB — RETICULOCYTES
Immature Retic Fract: 15.7 % (ref 2.3–15.9)
RBC.: 3.03 MIL/uL — ABNORMAL LOW (ref 3.87–5.11)
Retic Count, Absolute: 40 10*3/uL (ref 19.0–186.0)
Retic Ct Pct: 1.3 % (ref 0.4–3.1)

## 2019-05-22 LAB — URINE CULTURE: Culture: NO GROWTH

## 2019-05-22 LAB — TSH: TSH: 0.617 u[IU]/mL (ref 0.350–4.500)

## 2019-05-22 LAB — FOLATE: Folate: 23.4 ng/mL (ref >5.9)

## 2019-05-22 LAB — GLUCOSE, CAPILLARY: Glucose-Capillary: 105 mg/dL — ABNORMAL HIGH (ref 70–99)

## 2019-05-22 LAB — VITAMIN B12: Vitamin B-12: 141 pg/mL — ABNORMAL LOW (ref 180–914)

## 2019-05-22 MED ORDER — IPRATROPIUM BROMIDE 0.02 % IN SOLN
0.5000 mg | Freq: Two times a day (BID) | RESPIRATORY_TRACT | Status: DC
  Administered 2019-05-22 – 2019-05-24 (×5): 0.5 mg via RESPIRATORY_TRACT
  Filled 2019-05-22 (×6): qty 2.5

## 2019-05-22 MED ORDER — LEVALBUTEROL HCL 0.63 MG/3ML IN NEBU
0.6300 mg | INHALATION_SOLUTION | Freq: Two times a day (BID) | RESPIRATORY_TRACT | Status: DC
  Administered 2019-05-22 – 2019-05-24 (×5): 0.63 mg via RESPIRATORY_TRACT
  Filled 2019-05-22 (×6): qty 3

## 2019-05-22 MED ORDER — WARFARIN SODIUM 6 MG PO TABS
6.0000 mg | ORAL_TABLET | Freq: Once | ORAL | Status: AC
  Administered 2019-05-22: 6 mg via ORAL
  Filled 2019-05-22: qty 1

## 2019-05-22 MED ORDER — VITAMIN B-12 1000 MCG PO TABS
1000.0000 ug | ORAL_TABLET | Freq: Every day | ORAL | Status: DC
  Administered 2019-05-23 – 2019-05-27 (×5): 1000 ug via ORAL
  Filled 2019-05-22 (×5): qty 1

## 2019-05-22 MED ORDER — LISINOPRIL 10 MG PO TABS
10.0000 mg | ORAL_TABLET | Freq: Every day | ORAL | Status: DC
  Administered 2019-05-23: 10 mg via ORAL
  Filled 2019-05-22: qty 1

## 2019-05-22 NOTE — Care Management Important Message (Signed)
Important Message  Patient Details IM Letter given to Marney Doctor RN to present to the Patient Name: [name] MRN: [id number] Date of Birth: 15-Jun-1923   Medicare Important Message Given:  Yes     Kerin Salen 05/22/2019, 12:56 PM

## 2019-05-22 NOTE — TOC Transition Note (Signed)
Transition of Care [facility]) - CM/SW Discharge Note   Patient Details  Name: [name] MRN: [id number] Date of Birth: [birth year]  Transition of Care [facility]) CM/SW Contact:  Tycen Dockter, Marjie Skiff, RN Phone Number: 05/22/2019, 1:18 PM   Clinical Narrative:    Pt states she has been to [facility] previously and would like to go there again. This CM contacted [facility]iaison who state they can offer her a bed. Insurance auth started by [employer]. Probably will be Monday before auth comes back. Daughter made aware.     Barriers to Discharge: Continued Medical Work up   Patient Goals and CMS Choice   CMS Medicare.gov Compare Post Acute Care list provided to:: Other (Comment Required)(website down) Choice offered to / list presented to : Patient                    Discharge Plan and Services   Discharge Planning Services: CM Consult Post Acute Care Choice: Home Health                    HH Arranged: RN, PT, OT, Nurse's Aide, Social Work [facility] Agency: [place] Date [facility] Agency Contacted: 05/21/19 Time [place]: 1117 Representative spoke with at [place]: [place] ([place]) Interventions     Readmission Risk Interventions No flowsheet data found.

## 2019-05-22 NOTE — Progress Notes (Signed)
NT & OT ambulated with patient in hallway.  Patient maintained oxygen level of 91%-92% on RA.

## 2019-05-22 NOTE — Progress Notes (Signed)
PROGRESS NOTE  [name] XTK:[id number] DOB: [birth year] DOA: 05/19/2019 PCP: Sinda Du, MD  HPI/Recap of past 24 hours:  o2 91% while ambulating on room air this am She is very weak, very frail, needs assistance with most ADLs She denies chest pain, no edema INR now back to therapeutic range She agreed to  snf placement  Assessment/Plan: Active Problems:   Essential hypertension   Atrial fibrillation (HCC)   COPD (chronic obstructive pulmonary disease) ([place])   Arthropathy   PACEMAKER, PERMANENT   Sinoatrial node dysfunction (HCC)   Lobar pneumonia (HCC)   Acute respiratory failure with hypoxia (HCC)   Nausea and vomiting   Lobar pneumonia with acute hypoxic respiratory failure Reports n/v, aspiration pneumonia? Ct chest with multilobar pneumonia on the right Blood culture no growth Urine strep antigen negative, urine legionella antigen negative SARS -COV2 screening negative, respiratory viral panel negative, mrsa screening negative Speech eval recommends regular diet/thin liquid She has improved on rocephin/zithromax, changed to augmentin , plan for total of 5 days abx treatment  H/o COPD? No wheezing on exam, not home o2 dependent  Afib with SSS s/p pacemaker Mostly paced rhythm, she is improving, tele d/ed On coumadin with supratherapeutic INR  bp improving, increase lopressor back to home dose,   Chronic diastolic chf Hold lasix  HTN:  bp low normal on presentation, lopressor does decreased initially, now bp is improving, increase lopressor back to home dose, gradualy restart lisinopril lasix held since hospitalization, continue hold  Macrocytic anemia: mcv 105, hgb around 10 No sign of bleeding b12 is low, start b12 supplement tsh 0.6 retic inappropriately low Folate unremarkable Iron panel pending   FTT, she lives alone, uses a walker, PT/OT recommends snf,  She is very weak, it is not safe to discharge home , she agreed to discharge to  snf    Code Status: full  Family Communication: patient at bedside, son and daughter over the phone  Disposition Plan: SNF placement   Consultants:  none  Procedures:  none  Antibiotics:  Rocephin/zithro from admission to 7/9  augmentin from 7/9 to     Objective: BP (!) 155/75 (BP Location: Right Arm)   Pulse 63   Temp 98.4 F (36.9 C) (Oral)   Resp (!) 21   Ht 5\' 5"  (1.651 m)   Wt 71 kg   SpO2 96%   BMI 26.04 kg/m   Intake/Output Summary (Last 24 hours) at 05/22/2019 1855 Last data filed at 05/22/2019 1130 Gross per 24 hour  Intake 240 ml  Output 1401 ml  Net -1161 ml   Filed Weights   05/19/19 1706 05/21/19 0600  Weight: 68.4 kg 71 kg    Exam: Patient is examined daily including today on 05/22/2019, exams remain the same as of yesterday except that has changed    General:  Frail elderly, NAD, aaox3  Cardiovascular: RRR  Respiratory: CTABL  Abdomen: Soft/ND/NT, positive BS  Musculoskeletal: No Edema  Neuro: alert, oriented   Data Reviewed: Basic Metabolic Panel: Recent Labs  Lab 05/19/19 1142 05/19/19 1800 05/20/19 0544 05/21/19 0501 05/22/19 0434  NA 140  --  137 137 134*  K 3.7  --  3.7 3.7 4.0  CL 104  --  105 101 99  CO2 27  --  28 27 27   GLUCOSE 111*  --  101* 108* 105*  BUN 19  --  17 13 12   CREATININE 0.56  --  0.52 0.54 0.52  CALCIUM 8.5*  --  7.9* 8.2* 8.2*  MG  --  2.0 2.0 2.0  --   PHOS  --  3.9 3.0  --   --    Liver Function Tests: Recent Labs  Lab 05/19/19 1142 05/20/19 0544  AST 34 30  ALT 23 20  ALKPHOS 71 49  BILITOT 0.8 1.0  PROT 7.3 5.8*  ALBUMIN 3.5 2.8*   No results for input(s): LIPASE, AMYLASE in the last 168 hours. No results for input(s): AMMONIA in the last 168 hours. CBC: Recent Labs  Lab 05/19/19 1142 05/20/19 0544 05/21/19 0501  WBC 11.7* 14.1* 9.2  NEUTROABS 10.7* 11.6* 7.2  HGB 12.2 9.9* 9.9*  HCT 40.0 31.7* 33.0*  MCV 103.4* 105.0* 105.4*  PLT 208 168 138*   Cardiac Enzymes:    No results for input(s): CKTOTAL, CKMB, CKMBINDEX, TROPONINI in the last 168 hours. BNP (last 3 results) Recent Labs    05/19/19 1142  BNP 214.9*    ProBNP (last 3 results) No results for input(s): PROBNP in the last 8760 hours.  CBG: Recent Labs  Lab 05/21/19 0746 05/22/19 0803  GLUCAP 99 105*    Recent Results (from the past 240 hour(s))  SARS Coronavirus 2 (CEPHEID - Performed in [facility]), Hosp Order     Status: None   Collection Time: 05/19/19 11:42 AM   Specimen: Nasopharyngeal Swab  Result Value Ref Range Status   SARS Coronavirus 2 NEGATIVE NEGATIVE Final    Comment: (NOTE) If result is NEGATIVE SARS-CoV-2 target nucleic acids are NOT DETECTED. The SARS-CoV-2 RNA is generally detectable in upper and lower  respiratory specimens during the acute phase of infection. The lowest  concentration of SARS-CoV-2 viral copies this assay can detect is 250  copies / mL. A negative result does not preclude SARS-CoV-2 infection  and should not be used as the sole basis for treatment or other  patient management decisions.  A negative result may occur with  improper specimen collection / handling, submission of specimen other  than nasopharyngeal swab, presence of viral mutation(s) within the  areas targeted by this assay, and inadequate number of viral copies  (<250 copies / mL). A negative result must be combined with clinical  observations, patient history, and epidemiological information. If result is POSITIVE SARS-CoV-2 target nucleic acids are DETECTED. The SARS-CoV-2 RNA is generally detectable in upper and lower  respiratory specimens dur ing the acute phase of infection.  Positive  results are indicative of active infection with SARS-CoV-2.  Clinical  correlation with patient history and other diagnostic information is  necessary to determine patient infection status.  Positive results do  not rule out bacterial infection or co-infection with other  viruses. If result is PRESUMPTIVE POSTIVE SARS-CoV-2 nucleic acids MAY BE PRESENT.   A presumptive positive result was obtained on the submitted specimen  and confirmed on repeat testing.  While 2019 novel coronavirus  (SARS-CoV-2) nucleic acids may be present in the submitted sample  additional confirmatory testing may be necessary for epidemiological  and / or clinical management purposes  to differentiate between  SARS-CoV-2 and other Sarbecovirus currently known to infect humans.  If clinically indicated additional testing with an alternate test  methodology [phone]([id number]) is advised. The SARS-CoV-2 RNA is generally  detectable in upper and lower respiratory sp ecimens during the acute  phase of infection. The expected result is Negative. Fact Sheet for Patients:  BoilerBrush.com.cyhttps://www.fda.gov/media/136312/download Fact Sheet for Healthcare Providers: https://pope.com/https://www.fda.gov/media/136313/download This test is not yet approved or cleared by  the [employer] [place] FDA and has been authorized for detection and/or diagnosis of SARS-CoV-2 by FDA under an Emergency Use Authorization (EUA).  This EUA will remain in effect (meaning this test can be used) for the duration of the COVID-19 declaration under Section 564(b)(1) of the Act, 21 U.S.C. section 360bbb-3(b)(1), unless the authorization is terminated or revoked sooner. Performed at [facility], [address] [place], [place] [address]   Culture, Urine     Status: None   Collection Time: 05/19/19  6:11 PM   Specimen: Urine, Random  Result Value Ref Range Status   Specimen Description   Final    URINE, RANDOM Performed at [facility], [address] [place], [place] [address]    Special Requests   Final    NONE Performed at [facility], [address] [place], [place] [address]    Culture   Final    NO GROWTH Performed at [facility], [address] [place], [place] [address]     Report Status 05/22/2019 FINAL  Final  Culture, blood (routine x 2)     Status: None (Preliminary result)   Collection Time: 05/19/19  7:01 PM   Specimen: BLOOD  Result Value Ref Range Status   Specimen Description   Final    BLOOD LEFT ANTECUBITAL Performed at [facility], [address] [place], [place] [address]    Special Requests   Final    BOTTLES DRAWN AEROBIC ONLY Blood Culture adequate volume Performed at [facility], [address] [place], [place] [address]    Culture   Final    NO GROWTH 2 DAYS Performed at [facility], [address] [place], [place] [address]    Report Status PENDING  Incomplete  Culture, blood (routine x 2)     Status: None (Preliminary result)   Collection Time: 05/19/19  7:01 PM   Specimen: BLOOD LEFT HAND  Result Value Ref Range Status   Specimen Description   Final    BLOOD LEFT HAND Performed at [facility], [address] [place], [place] [address]    Special Requests   Final    BOTTLES DRAWN AEROBIC ONLY Blood Culture adequate volume Performed at [facility], [address] [place], [place] [address]    Culture   Final    NO GROWTH 2 DAYS Performed at [facility], [address] [place], [place] [address]    Report Status PENDING  Incomplete  Respiratory Panel by PCR     Status: None   Collection Time: 05/20/19  2:12 PM   Specimen: Nasopharyngeal Swab; Respiratory  Result Value Ref Range Status   Adenovirus NOT DETECTED NOT DETECTED Final   Coronavirus 229E NOT DETECTED NOT DETECTED Final    Comment: (NOTE) The Coronavirus on the Respiratory Panel, DOES NOT test for the novel  Coronavirus (2019 nCoV)    Coronavirus HKU1 NOT DETECTED NOT DETECTED Final   Coronavirus NL63 NOT DETECTED NOT DETECTED Final   Coronavirus OC43 NOT DETECTED NOT DETECTED Final   Metapneumovirus NOT DETECTED NOT DETECTED Final   Rhinovirus / Enterovirus  NOT DETECTED NOT DETECTED Final   Influenza A NOT DETECTED NOT DETECTED Final   Influenza B NOT DETECTED NOT DETECTED Final   Parainfluenza Virus 1 NOT DETECTED NOT DETECTED Final   Parainfluenza Virus 2 NOT DETECTED NOT DETECTED Final   Parainfluenza Virus 3 NOT DETECTED  NOT DETECTED Final   Parainfluenza Virus 4 NOT DETECTED NOT DETECTED Final   Respiratory Syncytial Virus NOT DETECTED NOT DETECTED Final   Bordetella pertussis NOT DETECTED NOT DETECTED Final   Chlamydophila pneumoniae NOT DETECTED NOT DETECTED Final   Mycoplasma pneumoniae NOT DETECTED NOT DETECTED Final    Comment: Performed at [facility], [address] [place], [place] [address]  MRSA PCR Screening     Status: None   Collection Time: 05/20/19  2:12 PM   Specimen: Nasal Mucosa; Nasopharyngeal  Result Value Ref Range Status   MRSA by PCR NEGATIVE NEGATIVE Final    Comment:        The GeneXpert MRSA Assay (FDA approved for NASAL specimens only), is one component of a comprehensive MRSA colonization surveillance program. It is not intended to diagnose MRSA infection nor to guide or monitor treatment for MRSA infections. Performed at [facility], [address] [place], [place] [address]      Studies: No results found.  Scheduled Meds: . amoxicillin-clavulanate  1 tablet Oral BID  . guaiFENesin  600 mg Oral BID  . ipratropium  0.5 mg Nebulization BID  . levalbuterol  0.63 mg Nebulization BID  . metoprolol tartrate  25 mg Oral BID  . multivitamin with minerals  1 tablet Oral Daily  . senna-docusate  1 tablet Oral BID  . warfarin  6 mg Oral ONCE-1800  . Warfarin - Pharmacist [facility]6JaUnited H8JacSo68uMargMaHershey Outpatient S4JackeCarepoint Health-[facility]oken Universit6JaRussell Count3JackeWinchKaiser Fn7JackeCobalLiNorth Haven Su6JThe Endoscopy C5JackeCentral CoLee Island Coas2JacSsm Health St. AnSonterra Proc4JaSonterra Proc5JackeLake Mayers M1JackeMillard Medical Center Fullerto5JackeJohnstoThomasvill6JF[place] AJackel([place] AAndochick Sur3JackeFrederic80GreenbriSan 3JackeProvideSurgery CenterJackeHill Country MNewmanJacWatertown Reg3iMargMalBary Richardlesal Ctr74Tyrone SagSan Angelo Community Medical [phone] e under date review session and assessment and plans.  I reviewed all nursing notes, pharmacy notes, vitals,  pertinent old records  I have discussed plan of care as described above with RN , patient on 05/22/2019   Coren Sagan MD, PhD  [facility] Pager [phone]. If 7PM-7AM, please contact night-coverage at [email or web address], password [email or web address] 05/22/2019, 6:55 PM  LOS: 3 days

## 2019-05-22 NOTE — Progress Notes (Signed)
Occupational Therapy Treatment Patient Details Name: Adrienne Mochaempie H Seal MRN: [id number] DOB: 1923/06/22 Today's Date: 05/22/2019    History of present illness 85 year old female admitted for nausea, vomiting and chills. Dx'd with pna.  PMH:  arthritis, endometrial CA, SA node dsyfunction, pacemaker, COPD, and HTN   OT comments  Pt had just finished ADL. Ambulated and checked 02 sats without 02.  91-92% on RA and min guard when ambulating/transfering.  Follow Up Recommendations  SNF    Equipment Recommendations  None recommended by OT    Recommendations for Other Services      Precautions / Restrictions Precautions Precautions: Fall Precaution Comments: monitor O2 Restrictions Weight Bearing Restrictions: No       Mobility Bed Mobility               General bed mobility comments: in chair  Transfers   Equipment used: Rolling walker (2 wheeled)   Sit to Stand: Min guard         General transfer comment: extra time    Balance     Sitting balance-Leahy Scale: Good       Standing balance-Leahy Scale: Fair                             ADL either performed or assessed with clinical judgement   ADL       Grooming: Set up                   Toilet Transfer: Min guard;Ambulation;RW             General ADL Comments: simulated toilet transfer.  ambulated in hall with min guard assist approximately 70 feet with 3 standing rest breaks to collect sat data.  Dyspnea 2/4.       Vision       Perception     Praxis      Cognition Arousal/Alertness: Awake/alert Behavior During Therapy: WFL for tasks assessed/performed Overall Cognitive Status: Within Functional Limits for tasks assessed                                          Exercises     Shoulder Instructions       General Comments did not use 02. Sats 95% initially and 91-92% when ambulating    Pertinent Vitals/ Pain       Pain Assessment: Faces Faces  Pain Scale: Hurts little more Pain Location: L knee after walking Pain Descriptors / Indicators: Sore Pain Intervention(s): Limited activity within patient's tolerance;Monitored during session;Repositioned  Home Living                                          Prior Functioning/Environment              Frequency  Min 2X/week        Progress Toward Goals  OT Goals(current goals can now be found in the care plan section)  Progress towards OT goals: Progressing toward goals     Plan      Co-evaluation                 AM-PAC OT "6 Clicks" Daily Activity     Outcome Measure   Help from another  person eating meals?: None Help from another person taking care of personal grooming?: A Little Help from another person toileting, which includes using toliet, bedpan, or urinal?: A Little Help from another person bathing (including washing, rinsing, drying)?: A Little Help from another person to put on and taking off regular upper body clothing?: A Little Help from another person to put on and taking off regular lower body clothing?: A Little 6 Click Score: 19    End of Session    OT Visit Diagnosis: Muscle weakness (generalized) (M62.81);Unsteadiness on feet (R26.81)   Activity Tolerance Patient tolerated treatment well   Patient Left in chair;with call bell/phone within reach;with chair alarm set   Nurse Communication          Time: 1003-1016 OT Time Calculation (min): 13 min  Charges: OT General Charges $OT Visit: 1 Visit OT Treatments $Therapeutic Activity: 8-22 mins  Lesle Chris, OTR/L Acute Rehabilitation Services [phone] [place] pager [phone] office 05/22/2019   [place] 05/22/2019, 11:24 AM

## 2019-05-22 NOTE — Progress Notes (Signed)
ANTICOAGULATION CONSULT NOTE  Pharmacy Consult for warfarin Indication: Afib  No Known Allergies  Patient Measurements: Height: 5\' 5"  (165.1 cm) Weight: 156 lb 8 oz (71 kg) IBW/kg (Calculated) : 57  Vital Signs: Temp: 98.3 F (36.8 C) (07/10 0504) Temp Source: Oral (07/10 0504) BP: 136/73 (07/10 0504) Pulse Rate: 68 (07/10 0504)  Labs: Recent Labs    05/19/19 1142 05/20/19 0544 05/21/19 0501 05/22/19 0434  HGB 12.2 9.9* 9.9*  --   HCT 40.0 31.7* 33.0*  --   PLT 208 168 138*  --   LABPROT 35.0* 35.8* 24.9* 19.6*  INR 3.6* 3.7* 2.3* 1.7*  CREATININE 0.56 0.52 0.54 0.52   Estimated Creatinine Clearance: 42.5 mL/min (by C-G formula based on SCr of 0.52 mg/dL).  Medical History: Past Medical History:  Diagnosis Date  . Arthritis   . Atrial fibrillation ([place])   . COPD (chronic obstructive pulmonary disease) ([place])   . Hip pain, left   . Hypertension   . Sinoatrial node dysfunction (HCC)     Medications:  Scheduled:  . amoxicillin-clavulanate  1 tablet Oral BID  . guaiFENesin  600 mg Oral BID  . ipratropium  0.5 mg Nebulization TID  . levalbuterol  0.63 mg Nebulization TID  . metoprolol tartrate  25 mg Oral BID  . multivitamin with minerals  1 tablet Oral Daily  . senna-docusate  1 tablet Oral BID  . Warfarin - Pharmacist Dosing Inpatient   Does not apply q1800   Assessment: 63 yoF with PMH Afib on warfarin PTA, SA node dysfunction s/p pacemaker, COPD, admitted 7/7 for CAP; Pharmacy to continue warfarin while admitted   Baseline INR elevated (consistent with acute infection)  Prior anticoagulation: warfarin 6 mg daily, last dose 7/6  Today, 05/22/2019:  Hgb decr 12.2 > 9.9 (7/9), Plt sl decr 138  INR now below therapeutic range at 1.7, Warfarin resumed 7/9 with large decrease in INR 3.7 > 2.3  Major drug interactions: broad spectrum abx may incr INR  Regular diet - intake improved  Goal of Therapy: INR 2-3 [facility] flowsheet current as of  04/08/19)  Plan:  Warfarin 6mg  today at 1800  Daily INR, CBC in am  CBC at least q72 hr while on warfarin  Monitor for signs of bleeding or thrombosis  Minda Ditto PharmD Pager [phone] 05/22/2019, 7:12 AM

## 2019-05-22 NOTE — [place] FL2 (Signed)
[place] MEDICAID FL2 LEVEL OF CARE SCREENING TOOL     IDENTIFICATION  Patient Name: [name] Birthdate: [birth year] Sex: female Admission Date (Current Location): 05/19/2019  [facility] [place]   Facility and Address:  [facility],  [address], [place] [address]      Provider Number: [id number]  Attending Physician Name and Address:  Albertine GratesXu, Fang, MD  Relative Name and Phone Number:       Current Level of Care: Hospital Recommended Level of Care: Skilled Nursing Facility Prior Approval Number:    Date Approved/Denied:   PASRR Number: [id number]  Discharge Plan: SNF    Current Diagnoses: Patient Active Problem List   Diagnosis Date Noted  . Lobar pneumonia (HCC) 05/19/2019  . Acute respiratory failure with hypoxia (HCC) 05/19/2019  . Nausea and vomiting 05/19/2019  . Hematoma of scalp   . Wheezing   . Acute blood loss anemia 11/13/2016  . Syncope and collapse 11/13/2016  . Fall 11/12/2016  . Annual physical exam 12/20/2014  . Hip fracture (HCC) 05/25/2014  . Encounter for therapeutic drug monitoring 01/20/2014  . H/O bilateral salpingo-oophorectomy 12/11/2013  . 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 Sinoatrial node dysfunction (HCC)   . Encounter for long-term (current) use of anticoagulants 04/30/2011  . PACEMAKER, PERMANENT 04/13/2009  . Essential hypertension 04/12/2009  . Atrial fibrillation (HCC) 04/12/2009  . COPD (chronic obstructive pulmonary disease) (HCC) 04/12/2009  . Arthropathy 04/12/2009    Orientation RESPIRATION BLADDER Height & Weight     Self, Time, Situation, Place  Normal Incontinent Weight: 156 lb 8 oz (71 kg) Height:  5\' 5"  (165.1 cm)  BEHAVIORAL SYMPTOMS/MOOD NEUROLOGICAL BOWEL NUTRITION STATUS      Continent Diet(low sodium)  AMBULATORY STATUS COMMUNICATION OF NEEDS Skin   Limited Assist Verbally Normal                       Personal Care Assistance Level of Assistance  Bathing,  Feeding, Dressing Bathing Assistance: Limited assistance Feeding assistance: Independent Dressing Assistance: Limited assistance     Functional Limitations Info  Sight, Hearing, Speech Sight Info: Adequate Hearing Info: Adequate Speech Info: Adequate    SPECIAL CARE FACTORS FREQUENCY  PT (By licensed PT), OT (By licensed OT)     PT Frequency: 5x OT Frequency: 5x            Contractures Contractures Info: Not present    Additional Factors Info  Code Status, Allergies Code Status Info: full code Allergies Info: nka           Current Medications (05/22/2019):  This is the current hospital active medication list Current Facility-Administered Medications  Medication Dose Route Frequency Provider Last Rate Last Dose  . acetaminophen (TYLENOL) tablet 650 mg  650 mg Oral Q6H PRN Marguerita MerlesSheikh, Omair Latif, DO       Or  . acetaminophen (TYLENOL) suppository 650 mg  650 mg Rectal Q6H PRN Marguerita MerlesSheikh, Omair Latif, DO      . amoxicillin-clavulanate (AUGMENTIN) 500-125 MG per tablet 500 mg  1 tablet Oral BID Albertine GratesXu, Fang, MD   500 mg at 05/21/19 2131  . bisacodyl (DULCOLAX) suppository 10 mg  10 mg Rectal Daily PRN Marguerita MerlesSheikh, Omair Latif, DO      . guaiFENesin (MUCINEX) 12 hr tablet 600 mg  600 mg Oral BID Albertine GratesXu, Fang, MD   600 mg at 05/22/19 0919  . ipratropium (ATROVENT) nebulizer solution 0.5 mg  0.5 mg Nebulization TID Albertine GratesXu, Fang,  MD   0.5 mg at 05/22/19 0727  . levalbuterol (XOPENEX) nebulizer solution 0.63 mg  0.63 mg Nebulization Q6H PRN Sheikh, Omair Latif, DO      . levalbuterol [facility]) nebulizer solution 0.63 mg  0.63 mg Nebulization TID Florencia Reasons, MD   0.63 mg at 05/22/19 0727  . metoprolol tartrate (LOPRESSOR) tablet 25 mg  25 mg Oral BID Florencia Reasons, MD   25 mg at 05/22/19 0919  . multivitamin with minerals tablet 1 tablet  1 tablet Oral Daily Raiford Noble Lorton, DO   1 tablet at 05/22/19 0919  . ondansetron (ZOFRAN) tablet 4 mg  4 mg Oral Q6H PRN Raiford Noble Latif, DO       Or  .  ondansetron [facility]) injection 4 mg  4 mg Intravenous Q6H PRN Sheikh, Omair Latif, DO      . polyethylene glycol (MIRALAX / GLYCOLAX) packet 17 g  17 g Oral Daily PRN Sheikh, Omair Latif, DO      . senna-docusate (Senokot-S) tablet 1 tablet  1 tablet Oral BID Florencia Reasons, MD   1 tablet at 05/22/19 0919  . warfarin (COUMADIN) tablet 6 mg  6 mg Oral ONCE-1800 Minda Ditto, RPH      . Warfarin - Pharmacist Dosing Inpatient   Does not apply q1800 Minda Ditto, [facility]         Discharge Medications: Please see discharge summary for a list of discharge medications.  Relevant Imaging Results:  Relevant Lab Results:   Additional Information SS#[id number]  Nila Nephew, LCSW

## 2019-05-23 LAB — BASIC METABOLIC PANEL
Anion gap: 8 (ref 5–15)
BUN: 12 mg/dL (ref 8–23)
CO2: 27 mmol/L (ref 22–32)
Calcium: 8.3 mg/dL — ABNORMAL LOW (ref 8.9–10.3)
Chloride: 100 mmol/L (ref 98–111)
Creatinine, Ser: 0.44 mg/dL (ref 0.44–1.00)
GFR calc Af Amer: >60 mL/min (ref >60)
GFR calc non Af Amer: >60 mL/min (ref >60)
Glucose, Bld: 100 mg/dL — ABNORMAL HIGH (ref 70–99)
Potassium: 3.7 mmol/L (ref 3.5–5.1)
Sodium: 135 mmol/L (ref 135–145)

## 2019-05-23 LAB — IRON AND TIBC
Iron: 29 ug/dL (ref 28–170)
Saturation Ratios: 9 % — ABNORMAL LOW (ref 10.4–31.8)
TIBC: 313 ug/dL (ref 250–450)
UIBC: 284 ug/dL

## 2019-05-23 LAB — CBC
HCT: 32.7 % — ABNORMAL LOW (ref 36.0–46.0)
Hemoglobin: 10.2 g/dL — ABNORMAL LOW (ref 12.0–15.0)
MCH: 32.3 pg (ref 26.0–34.0)
MCHC: 31.2 g/dL (ref 30.0–36.0)
MCV: 103.5 fL — ABNORMAL HIGH (ref 80.0–100.0)
Platelets: 187 10*3/uL (ref 150–400)
RBC: 3.16 MIL/uL — ABNORMAL LOW (ref 3.87–5.11)
RDW: 16.3 % — ABNORMAL HIGH (ref 11.5–15.5)
WBC: 6.2 10*3/uL (ref 4.0–10.5)
nRBC: 0 % (ref 0.0–0.2)

## 2019-05-23 LAB — PROTIME-INR
INR: 1.5 — ABNORMAL HIGH (ref 0.8–1.2)
Prothrombin Time: 17.8 seconds — ABNORMAL HIGH (ref 11.4–15.2)

## 2019-05-23 LAB — GLUCOSE, CAPILLARY: Glucose-Capillary: 106 mg/dL — ABNORMAL HIGH (ref 70–99)

## 2019-05-23 MED ORDER — WARFARIN SODIUM 5 MG PO TABS
7.5000 mg | ORAL_TABLET | Freq: Once | ORAL | Status: AC
  Administered 2019-05-23: 7.5 mg via ORAL
  Filled 2019-05-23: qty 1

## 2019-05-23 MED ORDER — LISINOPRIL 20 MG PO TABS
20.0000 mg | ORAL_TABLET | Freq: Every day | ORAL | Status: DC
  Administered 2019-05-24 – 2019-05-26 (×3): 20 mg via ORAL
  Filled 2019-05-23 (×3): qty 1

## 2019-05-23 NOTE — Progress Notes (Signed)
PROGRESS NOTE  Fenton Foyempie H Dalgleish ZOX:[id number] DOB: 1923/08/11 DOA: 05/19/2019 PCP: [name], Edward, MD  HPI/Recap of past 24 hours:  Feeling better, awaiting for placement  She is aaox3 but very weak, very frail, needs assistance /supervision She denies chest pain, no edema INR now back to therapeutic range She agreed to  snf placement  Assessment/Plan: Active Problems:   Essential hypertension   Atrial fibrillation (HCC)   COPD (chronic obstructive pulmonary disease) (HCC)   Arthropathy   PACEMAKER, PERMANENT   Sinoatrial node dysfunction (HCC)   Lobar pneumonia (HCC)   Acute respiratory failure with hypoxia (HCC)   Nausea and vomiting   FTT (failure to thrive) in adult   Lobar pneumonia with acute hypoxic respiratory failure Reports n/v, aspiration pneumonia? Ct chest with multilobar pneumonia on the right Blood culture no growth Urine strep antigen negative, urine legionella antigen negative SARS -COV2 screening negative, respiratory viral panel negative, mrsa screening negative Speech eval recommends regular diet/thin liquid She has improved on rocephin/zithromax, changed to augmentin , plan for total of 5 days abx treatment  H/o COPD? No wheezing on exam, not home o2 dependent She does has upper airway wheezing intermittently  Afib with SSS s/p pacemaker Mostly paced rhythm, she is improving, tele d/ed On coumadin with supratherapeutic INR  bp improving, increase lopressor back to home dose,   Chronic diastolic chf On lasix prn at home, euvolemic on exam, has not need any lasix since in the hospital  HTN:  bp low normal on presentation, lopressor does decreased initially, now bp is improving, increase lopressor back to home dose, gradualy restart lisinopril lasix prn held since hospitalization, continue hold  Macrocytic anemia: mcv 105, hgb around 10 No sign of bleeding b12 is low, start b12 supplement tsh 0.6 retic inappropriately low Folate  unremarkable Iron panel pending   FTT, she lives alone, uses a walker, PT/OT recommends snf,  She is very weak, it is not safe to discharge home , she agreed to discharge to snf    Code Status: full  Family Communication: patient at bedside, son and daughter over the phone  Disposition Plan: SNF placement, awaiting insurance authorization    Consultants:  none  Procedures:  none  Antibiotics:  Rocephin/zithro from admission to 7/9  augmentin from 7/9 to     Objective: BP (!) 142/87 (BP Location: Right Arm)   Pulse 64   Temp 98.3 F (36.8 C) (Oral)   Resp 16   Ht 5\' 5"  (1.651 m)   Wt 71 kg   SpO2 98%   BMI 26.04 kg/m   Intake/Output Summary (Last 24 hours) at 05/23/2019 1613 Last data filed at 05/23/2019 0850 Gross per 24 hour  Intake 360 ml  Output 700 ml  Net -340 ml   Filed Weights   05/19/19 1706 05/21/19 0600  Weight: 68.4 kg 71 kg    Exam: Patient is examined daily including today on 05/23/2019, exams remain the same as of yesterday except that has changed    General:  Frail elderly, NAD, aaox3  Cardiovascular: RRR  Respiratory: CTABL  Abdomen: Soft/ND/NT, positive BS  Musculoskeletal: No Edema  Neuro: alert, oriented   Data Reviewed: Basic Metabolic Panel: Recent Labs  Lab 05/19/19 1142 05/19/19 1800 05/20/19 0544 05/21/19 0501 05/22/19 0434 05/23/19 0557  NA 140  --  137 137 134* 135  K 3.7  --  3.7 3.7 4.0 3.7  CL 104  --  105 101 99 100  CO2 27  --  28 27 27 27   GLUCOSE 111*  --  101* 108* 105* 100*  BUN 19  --  17 13 12 12   CREATININE 0.56  --  0.52 0.54 0.52 0.44  CALCIUM 8.5*  --  7.9* 8.2* 8.2* 8.3*  MG  --  2.0 2.0 2.0  --   --   PHOS  --  3.9 3.0  --   --   --    Liver Function Tests: Recent Labs  Lab 05/19/19 1142 05/20/19 0544  AST 34 30  ALT 23 20  ALKPHOS 71 49  BILITOT 0.8 1.0  PROT 7.3 5.8*  ALBUMIN 3.5 2.8*   No results for input(s): LIPASE, AMYLASE in the last 168 hours. No results for  input(s): AMMONIA in the last 168 hours. CBC: Recent Labs  Lab 05/19/19 1142 05/20/19 0544 05/21/19 0501 05/23/19 0557  WBC 11.7* 14.1* 9.2 6.2  NEUTROABS 10.7* 11.6* 7.2  --   HGB 12.2 9.9* 9.9* 10.2*  HCT 40.0 31.7* 33.0* 32.7*  MCV 103.4* 105.0* 105.4* 103.5*  PLT 208 168 138* 187   Cardiac Enzymes:   No results for input(s): CKTOTAL, CKMB, CKMBINDEX, TROPONINI in the last 168 hours. BNP (last 3 results) Recent Labs    05/19/19 1142  BNP 214.9*    ProBNP (last 3 results) No results for input(s): PROBNP in the last 8760 hours.  CBG: Recent Labs  Lab 05/21/19 0746 05/22/19 0803 05/23/19 0806  GLUCAP 99 105* 106*    Recent Results (from the past 240 hour(s))  SARS Coronavirus 2 (CEPHEID - Performed in [place] hospital lab), Hosp Order     Status: None   Collection Time: 05/19/19 11:42 AM   Specimen: Nasopharyngeal Swab  Result Value Ref Range Status   SARS Coronavirus 2 NEGATIVE NEGATIVE Final    Comment: (NOTE) If result is NEGATIVE SARS-CoV-2 target nucleic acids are NOT DETECTED. The SARS-CoV-2 RNA is generally detectable in upper and lower  respiratory specimens during the acute phase of infection. The lowest  concentration of SARS-CoV-2 viral copies this assay can detect is 250  copies / mL. A negative result does not preclude SARS-CoV-2 infection  and should not be used as the sole basis for treatment or other  patient management decisions.  A negative result may occur with  improper specimen collection / handling, submission of specimen other  than nasopharyngeal swab, presence of viral mutation(s) within the  areas targeted by this assay, and inadequate number of viral copies  (<250 copies / mL). A negative result must be combined with clinical  observations, patient history, and epidemiological information. If result is POSITIVE SARS-CoV-2 target nucleic acids are DETECTED. The SARS-CoV-2 RNA is generally detectable in upper and lower   respiratory specimens dur ing the acute phase of infection.  Positive  results are indicative of active infection with SARS-CoV-2.  Clinical  correlation with patient history and other diagnostic information is  necessary to determine patient infection status.  Positive results do  not rule out bacterial infection or co-infection with other viruses. If result is PRESUMPTIVE POSTIVE SARS-CoV-2 nucleic acids MAY BE PRESENT.   A presumptive positive result was obtained on the submitted specimen  and confirmed on repeat testing.  While 2019 novel coronavirus  (SARS-CoV-2) nucleic acids may be present in the submitted sample  additional confirmatory testing may be necessary for epidemiological  and / or clinical management purposes  to differentiate between  SARS-CoV-2 and other Sarbecovirus currently known to infect humans.  If  clinically indicated additional testing with an alternate test  methodology [phone]([id number]) is advised. The SARS-CoV-2 RNA is generally  detectable in upper and lower respiratory sp ecimens during the acute  phase of infection. The expected result is Negative. Fact Sheet for Patients:  BoilerBrush.com.cyhttps://www.fda.gov/media/136312/download Fact Sheet for Healthcare Providers: https://pope.com/https://www.fda.gov/media/136313/download This test is not yet approved or cleared by the [place] FDA and has been authorized for detection and/or diagnosis of SARS-CoV-2 by FDA under an Emergency Use Authorization (EUA).  This EUA will remain in effect (meaning this test can be used) for the duration of the COVID-19 declaration under Section 564(b)(1) of the Act, 21 U.S.C. section 360bbb-3(b)(1), unless the authorization is terminated or revoked sooner. Performed at [facility], [address] [place], [place] [address]   Culture, Urine     Status: None   Collection Time: 05/19/19  6:11 PM   Specimen: Urine, Random  Result Value Ref Range Status   Specimen Description   Final     URINE, RANDOM Performed at [facility], [address] [place], [place] [address]    Special Requests   Final    NONE Performed at [facility], [address] [place], [place] [address]    Culture   Final    NO GROWTH Performed at [facility], [address] [place], [place] [address]    Report Status 05/22/2019 FINAL  Final  Culture, blood (routine x 2)     Status: None (Preliminary result)   Collection Time: 05/19/19  7:01 PM   Specimen: BLOOD  Result Value Ref Range Status   Specimen Description   Final    BLOOD LEFT ANTECUBITAL Performed at [facility], [address] [place], [place] [address]    Special Requests   Final    BOTTLES DRAWN AEROBIC ONLY Blood Culture adequate volume Performed at [facility], [address] [place], [place] [address]    Culture   Final    NO GROWTH 2 DAYS Performed at [facility], [address] [place], [place] [address]    Report Status PENDING  Incomplete  Culture, blood (routine x 2)     Status: None (Preliminary result)   Collection Time: 05/19/19  7:01 PM   Specimen: BLOOD LEFT HAND  Result Value Ref Range Status   Specimen Description   Final    BLOOD LEFT HAND Performed at [facility], [address] [place], [place] [address]    Special Requests   Final    BOTTLES DRAWN AEROBIC ONLY Blood Culture adequate volume Performed at [facility], [address] [place], [place] [address]    Culture   Final    NO GROWTH 2 DAYS Performed at [facility], [address] [place], [place] [address]    Report Status PENDING  Incomplete  Respiratory Panel by PCR     Status: None   Collection Time: 05/20/19  2:12 PM   Specimen: Nasopharyngeal Swab; Respiratory  Result Value Ref Range Status   Adenovirus NOT DETECTED NOT DETECTED Final   Coronavirus 229E NOT DETECTED NOT DETECTED Final     Comment: (NOTE) The Coronavirus on the Respiratory Panel, DOES NOT test for the novel  Coronavirus (2019 nCoV)    Coronavirus HKU1 NOT DETECTED NOT DETECTED Final   Coronavirus NL63 NOT DETECTED NOT DETECTED Final   Coronavirus OC43 NOT DETECTED NOT DETECTED Final   Metapneumovirus  NOT DETECTED NOT DETECTED Final   Rhinovirus / Enterovirus NOT DETECTED NOT DETECTED Final   Influenza A NOT DETECTED NOT DETECTED Final   Influenza B NOT DETECTED NOT DETECTED Final   Parainfluenza Virus 1 NOT DETECTED NOT DETECTED Final   Parainfluenza Virus 2 NOT DETECTED NOT DETECTED Final   Parainfluenza Virus 3 NOT DETECTED NOT DETECTED Final   Parainfluenza Virus 4 NOT DETECTED NOT DETECTED Final   Respiratory Syncytial Virus NOT DETECTED NOT DETECTED Final   Bordetella pertussis NOT DETECTED NOT DETECTED Final   Chlamydophila pneumoniae NOT DETECTED NOT DETECTED Final   Mycoplasma pneumoniae NOT DETECTED NOT DETECTED Final    Comment: Performed at [facility], [address] [place], [place] [address]  MRSA PCR Screening     Status: None   Collection Time: 05/20/19  2:12 PM   Specimen: Nasal Mucosa; Nasopharyngeal  Result Value Ref Range Status   MRSA by PCR NEGATIVE NEGATIVE Final    Comment:        The GeneXpert MRSA Assay (FDA approved for NASAL specimens only), is one component of a comprehensive MRSA colonization surveillance program. It is not intended to diagnose MRSA infection nor to guide or monitor treatment for MRSA infections. Performed at [facility], [address] [place], [place] [address]      Studies: No results found.  Scheduled Meds: . amoxicillin-clavulanate  1 tablet Oral BID  . guaiFENesin  600 mg Oral BID  . ipratropium  0.5 mg Nebulization BID  . levalbuterol  0.63 mg Nebulization BID  . lisinopril  10 mg Oral Daily  . metoprolol tartrate  25 mg Oral BID  . multivitamin with minerals  1 tablet Oral Daily  . senna-docusate  1  tablet Oral BID  . vitamin B-12  1,000 mcg Oral Daily  . warfarin  7.5 mg Oral ONCE-1800  . Warfarin - Pharmacist Dosing Inpatient   Does not apply q1800    Continuous Infusions:    Time spent: 25mins I have personally reviewed and interpreted on  05/23/2019 daily labs, imagings as discussed above under date review session and assessment and plans.  I reviewed all nursing notes, pharmacy notes, vitals, pertinent old records  I have discussed plan of care as described above with RN , patient on 05/23/2019   Albertine GratesFang Derrion Tritz MD, PhD  [facility] Pager [phone]. If 7PM-7AM, please contact night-coverage at [email or web address], password [email or web address] 05/23/2019, 4:13 PM  LOS: 4 days

## 2019-05-23 NOTE — Progress Notes (Signed)
Patient a&ox4, Assisted to the bathroom, general weakness noted, used walker, had some stress incontinence, skin intact, ble edema noted, ble cool to touch but +pp, +csm, scd's applied once back in bed, lungs diminished but clear, no cough noted, encouraged incentive spirometer use, took evening pills whole with water, call bell in reach, will monitor, awaiting snf placement as she lives alone and is not safe to return at this time.

## 2019-05-23 NOTE — Progress Notes (Signed)
ANTICOAGULATION CONSULT NOTE  Pharmacy Consult for warfarin Indication: Afib  No Known Allergies  Patient Measurements: Height: 5\' 5"  (165.1 cm) Weight: 156 lb 8 oz (71 kg) IBW/kg (Calculated) : 57  Vital Signs: Temp: 98.7 F (37.1 C) (07/11 0528) Temp Source: Oral (07/11 0528) BP: 156/130 (07/11 0528) Pulse Rate: 65 (07/11 0528)  Labs: Recent Labs    05/21/19 0501 05/22/19 0434 05/23/19 0557  HGB 9.9*  --  10.2*  HCT 33.0*  --  32.7*  PLT 138*  --  187  LABPROT 24.9* 19.6* 17.8*  INR 2.3* 1.7* 1.5*  CREATININE 0.54 0.52 0.44   Estimated Creatinine Clearance: 42.5 mL/min (by C-G formula based on SCr of 0.44 mg/dL).  Medical History: Past Medical History:  Diagnosis Date  . Arthritis   . Atrial fibrillation ([place])   . COPD (chronic obstructive pulmonary disease) ([place])   . Hip pain, left   . Hypertension   . Sinoatrial node dysfunction (HCC)     Medications:  Scheduled:  . amoxicillin-clavulanate  1 tablet Oral BID  . guaiFENesin  600 mg Oral BID  . ipratropium  0.5 mg Nebulization BID  . levalbuterol  0.63 mg Nebulization BID  . lisinopril  10 mg Oral Daily  . metoprolol tartrate  25 mg Oral BID  . multivitamin with minerals  1 tablet Oral Daily  . senna-docusate  1 tablet Oral BID  . vitamin B-12  1,000 mcg Oral Daily  . Warfarin - Pharmacist Dosing Inpatient   Does not apply q1800   Assessment: 74 yoF with PMH Afib on warfarin PTA, SA node dysfunction s/p pacemaker, COPD, admitted 7/7 for CAP; Pharmacy to continue warfarin while admitted   Baseline INR elevated (consistent with acute infection)  Prior anticoagulation: warfarin 6 mg daily, last dose 7/6  Today, 05/23/2019:  Hgb decr 12.2 > 10.2, Plt 187  INR remains below therapeutic range at 1.5, Warfarin resumed 7/9 with large decrease in INR 3.7 > 2.3  Major drug interactions: broad spectrum abx may incr INR  Regular diet - intake much improved  Goal of Therapy: INR 2-3 [facility] flowsheet  current as of 04/08/19)  Plan:  Warfarin 7.5mg  today at 1800  Daily INR, CBC in am  CBC at least q72 hr while on warfarin  Monitor for signs of bleeding or thrombosis  Minda Ditto PharmD Pager [phone] 05/23/2019, 9:16 AM

## 2019-05-24 LAB — PROTIME-INR
INR: 1.6 — ABNORMAL HIGH (ref 0.8–1.2)
Prothrombin Time: 18.6 seconds — ABNORMAL HIGH (ref 11.4–15.2)

## 2019-05-24 LAB — GLUCOSE, CAPILLARY: Glucose-Capillary: 101 mg/dL — ABNORMAL HIGH (ref 70–99)

## 2019-05-24 MED ORDER — FUROSEMIDE 10 MG/ML IJ SOLN
40.0000 mg | Freq: Once | INTRAMUSCULAR | Status: AC
  Administered 2019-05-24: 18:00:00 40 mg via INTRAVENOUS
  Filled 2019-05-24: qty 4

## 2019-05-24 MED ORDER — WARFARIN SODIUM 6 MG PO TABS
6.0000 mg | ORAL_TABLET | Freq: Once | ORAL | Status: AC
  Administered 2019-05-24: 6 mg via ORAL
  Filled 2019-05-24: qty 1

## 2019-05-24 NOTE — Progress Notes (Addendum)
PROGRESS NOTE  [name] NFA:[id number] DOB: 02/22/1923 DOA: 05/19/2019 PCP: Sinda Du, MD  Brief Summary:  From home , n/v at home, found to have multilobar pneumonia including right upper and right lower lobe,  acute hypoxia.  Improved, awaiting insurance authorization for snf placement    HPI/Recap of past 24 hours:  awaiting for placement,   She is aaox3 but very weak, very frail, needs assistance /supervision  She denies chest pain, reports legs are getting tight, she reports takes lasix every other day at home when she feels her legs are getting tight    Assessment/Plan: Active Problems:   Essential hypertension   Atrial fibrillation (HCC)   COPD (chronic obstructive pulmonary disease) ([place])   Arthropathy   PACEMAKER, PERMANENT   Sinoatrial node dysfunction (HCC)   Lobar pneumonia (HCC)   Acute respiratory failure with hypoxia (HCC)   Nausea and vomiting   FTT (failure to thrive) in adult   Lobar pneumonia with acute hypoxic respiratory failure Reports n/v, aspiration pneumonia? Ct chest with multilobar pneumonia on the right Blood culture no growth Urine strep antigen negative, urine legionella antigen negative SARS -COV2 screening negative, respiratory viral panel negative, mrsa screening negative Speech eval recommends regular diet/thin liquid She has improved on rocephin/zithromax, changed to augmentin ,she finished total of  5 days abx treatment in house, awaiting for snf placement  H/o COPD? No wheezing on exam, not home o2 dependent She does has upper airway wheezing intermittently  Afib with SSS s/p pacemaker On coumadin with supratherapeutic INR on presentation Mostly paced rhythm, she is improving, tele d/ed Continue betablocker, coumadin per pharmacy, monitor INR     Chronic diastolic chf On lasix prn at home, Lasix x1 on 7/12  HTN:  bp low normal on presentation, lopressor does decreased initially, now bp is improving,  increase lopressor back to home dose, gradualy restart lisinopril   Macrocytic anemia: mcv 105, hgb around 10 No sign of bleeding b12 is low, start b12 supplement tsh 0.6 retic inappropriately low Folate unremarkable Iron panel pending   FTT, she lives alone, uses a walker, PT/OT recommends snf,  She is very weak, it is not safe to discharge home , she agreed to discharge to snf    Code Status: full  Family Communication: patient at bedside, son and daughter over the phone  Disposition Plan: SNF placement, awaiting insurance authorization   Repeat covid screening test for snf placement obtained on 7/12  Consultants:  none  Procedures:  none  Antibiotics:  Rocephin/zithro from admission to 7/9  augmentin from 7/9 to 7/11    Objective: BP 136/83 (BP Location: Left Arm)   Pulse (!) 59   Temp 98.2 F (36.8 C) (Oral)   Resp 20   Ht 5\' 5"  (1.651 m)   Wt 67.5 kg   SpO2 96%   BMI 24.78 kg/m   Intake/Output Summary (Last 24 hours) at 05/24/2019 1324 Last data filed at 05/24/2019 0600 Gross per 24 hour  Intake 360 ml  Output 1600 ml  Net -1240 ml   Filed Weights   05/19/19 1706 05/21/19 0600 05/24/19 0606  Weight: 68.4 kg 71 kg 67.5 kg    Exam: Patient is examined daily including today on 05/24/2019, exams remain the same as of yesterday except that has changed    General:  Frail elderly, NAD, aaox3  Cardiovascular: RRR  Respiratory: CTABL  Abdomen: Soft/ND/NT, positive BS  Musculoskeletal: No Edema  Neuro: alert, oriented   Data Reviewed: Basic  Metabolic Panel: Recent Labs  Lab 05/19/19 1142 05/19/19 1800 05/20/19 0544 05/21/19 0501 05/22/19 0434 05/23/19 0557  NA 140  --  137 137 134* 135  K 3.7  --  3.7 3.7 4.0 3.7  CL 104  --  105 101 99 100  CO2 27  --  28 27 27 27   GLUCOSE 111*  --  101* 108* 105* 100*  BUN 19  --  17 13 12 12   CREATININE 0.56  --  0.52 0.54 0.52 0.44  CALCIUM 8.5*  --  7.9* 8.2* 8.2* 8.3*  MG  --  2.0 2.0  2.0  --   --   PHOS  --  3.9 3.0  --   --   --    Liver Function Tests: Recent Labs  Lab 05/19/19 1142 05/20/19 0544  AST 34 30  ALT 23 20  ALKPHOS 71 49  BILITOT 0.8 1.0  PROT 7.3 5.8*  ALBUMIN 3.5 2.8*   No results for input(s): LIPASE, AMYLASE in the last 168 hours. No results for input(s): AMMONIA in the last 168 hours. CBC: Recent Labs  Lab 05/19/19 1142 05/20/19 0544 05/21/19 0501 05/23/19 0557  WBC 11.7* 14.1* 9.2 6.2  NEUTROABS 10.7* 11.6* 7.2  --   HGB 12.2 9.9* 9.9* 10.2*  HCT 40.0 31.7* 33.0* 32.7*  MCV 103.4* 105.0* 105.4* 103.5*  PLT 208 168 138* 187   Cardiac Enzymes:   No results for input(s): CKTOTAL, CKMB, CKMBINDEX, TROPONINI in the last 168 hours. BNP (last 3 results) Recent Labs    05/19/19 1142  BNP 214.9*    ProBNP (last 3 results) No results for input(s): PROBNP in the last 8760 hours.  CBG: Recent Labs  Lab 05/21/19 0746 05/22/19 0803 05/23/19 0806 05/24/19 0746  GLUCAP 99 105* 106* 101*    Recent Results (from the past 240 hour(s))  SARS Coronavirus 2 (CEPHEID - Performed in [facility] hospital lab), Hosp Order     Status: None   Collection Time: 05/19/19 11:42 AM   Specimen: Nasopharyngeal Swab  Result Value Ref Range Status   SARS Coronavirus 2 NEGATIVE NEGATIVE Final    Comment: (NOTE) If result is NEGATIVE SARS-CoV-2 target nucleic acids are NOT DETECTED. The SARS-CoV-2 RNA is generally detectable in upper and lower  respiratory specimens during the acute phase of infection. The lowest  concentration of SARS-CoV-2 viral copies this assay can detect is 250  copies / mL. A negative result does not preclude SARS-CoV-2 infection  and should not be used as the sole basis for treatment or other  patient management decisions.  A negative result may occur with  improper specimen collection / handling, submission of specimen other  than nasopharyngeal swab, presence of viral mutation(s) within the  areas targeted by this  assay, and inadequate number of viral copies  (<250 copies / mL). A negative result must be combined with clinical  observations, patient history, and epidemiological information. If result is POSITIVE SARS-CoV-2 target nucleic acids are DETECTED. The SARS-CoV-2 RNA is generally detectable in upper and lower  respiratory specimens dur ing the acute phase of infection.  Positive  results are indicative of active infection with SARS-CoV-2.  Clinical  correlation with patient history and other diagnostic information is  necessary to determine patient infection status.  Positive results do  not rule out bacterial infection or co-infection with other viruses. If result is PRESUMPTIVE POSTIVE SARS-CoV-2 nucleic acids MAY BE PRESENT.   A presumptive positive result was obtained on  the submitted specimen  and confirmed on repeat testing.  While 2019 novel coronavirus  (SARS-CoV-2) nucleic acids may be present in the submitted sample  additional confirmatory testing may be necessary for epidemiological  and / or clinical management purposes  to differentiate between  SARS-CoV-2 and other Sarbecovirus currently known to infect humans.  If clinically indicated additional testing with an alternate test  methodology [phone]([id number]) is advised. The SARS-CoV-2 RNA is generally  detectable in upper and lower respiratory sp ecimens during the acute  phase of infection. The expected result is Negative. Fact Sheet for Patients:  BoilerBrush.com.cyhttps://www.fda.gov/media/136312/download Fact Sheet for Healthcare Providers: https://pope.com/https://www.fda.gov/media/136313/download This test is not yet approved or cleared by the [place] FDA and has been authorized for detection and/or diagnosis of SARS-CoV-2 by FDA under an Emergency Use Authorization (EUA).  This EUA will remain in effect (meaning this test can be used) for the duration of the COVID-19 declaration under Section 564(b)(1) of the Act, 21 U.S.C. section 360bbb-3(b)(1),  unless the authorization is terminated or revoked sooner. Performed at [facility], [address] [place], [place] [address]   Culture, Urine     Status: None   Collection Time: 05/19/19  6:11 PM   Specimen: Urine, Random  Result Value Ref Range Status   Specimen Description   Final    URINE, RANDOM Performed at [facility], [address] [place], [place] [address]    Special Requests   Final    NONE Performed at [facility], [address] [place], [place] [address]    Culture   Final    NO GROWTH Performed at [facility], [address] [place], [place] [address]    Report Status 05/22/2019 FINAL  Final  Culture, blood (routine x 2)     Status: None (Preliminary result)   Collection Time: 05/19/19  7:01 PM   Specimen: BLOOD  Result Value Ref Range Status   Specimen Description   Final    BLOOD LEFT ANTECUBITAL Performed at [facility], [address] [place], [place] [address]    Special Requests   Final    BOTTLES DRAWN AEROBIC ONLY Blood Culture adequate volume Performed at [facility], [address] [place], [place] [address]    Culture   Final    NO GROWTH 3 DAYS Performed at [facility], [address] [place], [place] [address]    Report Status PENDING  Incomplete  Culture, blood (routine x 2)     Status: None (Preliminary result)   Collection Time: 05/19/19  7:01 PM   Specimen: BLOOD LEFT HAND  Result Value Ref Range Status   Specimen Description   Final    BLOOD LEFT HAND Performed at [facility], [address] [place], [place] [address]    Special Requests   Final    BOTTLES DRAWN AEROBIC ONLY Blood Culture adequate volume Performed at [facility], [address] [place], [place] [address]    Culture   Final    NO GROWTH 3 DAYS Performed at [facility], [address]  [place], [place] [address]    Report Status PENDING  Incomplete  Respiratory Panel by PCR     Status: None   Collection Time: 05/20/19  2:12 PM   Specimen: Nasopharyngeal Swab; Respiratory  Result Value Ref Range Status   Adenovirus NOT DETECTED NOT DETECTED Final   Coronavirus 229E  NOT DETECTED NOT DETECTED Final    Comment: (NOTE) The Coronavirus on the Respiratory Panel, DOES NOT test for the novel  Coronavirus (2019 nCoV)    Coronavirus HKU1 NOT DETECTED NOT DETECTED Final   Coronavirus NL63 NOT DETECTED NOT DETECTED Final   Coronavirus OC43 NOT DETECTED NOT DETECTED Final   Metapneumovirus NOT DETECTED NOT DETECTED Final   Rhinovirus / Enterovirus NOT DETECTED NOT DETECTED Final   Influenza A NOT DETECTED NOT DETECTED Final   Influenza B NOT DETECTED NOT DETECTED Final   Parainfluenza Virus 1 NOT DETECTED NOT DETECTED Final   Parainfluenza Virus 2 NOT DETECTED NOT DETECTED Final   Parainfluenza Virus 3 NOT DETECTED NOT DETECTED Final   Parainfluenza Virus 4 NOT DETECTED NOT DETECTED Final   Respiratory Syncytial Virus NOT DETECTED NOT DETECTED Final   Bordetella pertussis NOT DETECTED NOT DETECTED Final   Chlamydophila pneumoniae NOT DETECTED NOT DETECTED Final   Mycoplasma pneumoniae NOT DETECTED NOT DETECTED Final    Comment: Performed at [facility], [address] [place], [place] [address]  MRSA PCR Screening     Status: None   Collection Time: 05/20/19  2:12 PM   Specimen: Nasal Mucosa; Nasopharyngeal  Result Value Ref Range Status   MRSA by PCR NEGATIVE NEGATIVE Final    Comment:        The GeneXpert MRSA Assay (FDA approved for NASAL specimens only), is one component of a comprehensive MRSA colonization surveillance program. It is not intended to diagnose MRSA infection nor to guide or monitor treatment for MRSA infections. Performed at [facility], [address] [place], [place] [address]      Studies: No results found.   Scheduled Meds: . furosemide  40 mg Intravenous Once  . guaiFENesin  600 mg Oral BID  . ipratropium  0.5 mg Nebulization BID  . levalbuterol  0.63 mg Nebulization BID  . lisinopril  20 mg Oral Daily  . metoprolol tartrate  25 mg Oral BID  . multivitamin with minerals  1 tablet Oral Daily  . senna-docusate  1 tablet Oral BID  . vitamin B-12  1,000 mcg Oral Daily  . warfarin  6 mg Oral ONCE-1800  . Warfarin - Pharmacist Dosing Inpatient   Does not apply q1800    Continuous Infusions:    Time spent: 25mins I have personally reviewed and interpreted on  05/24/2019 daily labs, imagings as discussed above under date review session and assessment and plans.  I reviewed all nursing notes, pharmacy notes, vitals, pertinent old records  I have discussed plan of care as described above with RN , patient on 05/24/2019   Albertine GratesFang Olina Melfi MD, PhD  [facility] Pager [phone]. If 7PM-7AM, please contact night-coverage at [email or web address], password [email or web address] 05/24/2019, 1:24 PM  LOS: 5 days

## 2019-05-24 NOTE — Progress Notes (Signed)
ANTICOAGULATION CONSULT NOTE  Pharmacy Consult for warfarin Indication: Afib  No Known Allergies  Patient Measurements: Height: 5\' 5"  (165.1 cm) Weight: 148 lb 14.4 oz (67.5 kg) IBW/kg (Calculated) : 57  Vital Signs: Temp: 97.9 F (36.6 C) (07/12 0606) BP: 139/86 (07/12 0606) Pulse Rate: 64 (07/12 0606)  Labs: Recent Labs    05/22/19 0434 05/23/19 0557 05/24/19 0529  HGB  --  10.2*  --   HCT  --  32.7*  --   PLT  --  187  --   LABPROT 19.6* 17.8* 18.6*  INR 1.7* 1.5* 1.6*  CREATININE 0.52 0.44  --    Estimated Creatinine Clearance: 38.7 mL/min (by C-G formula based on SCr of 0.44 mg/dL).  Medical History: Past Medical History:  Diagnosis Date  . Arthritis   . Atrial fibrillation ([place])   . COPD (chronic obstructive pulmonary disease) ([place])   . Hip pain, left   . Hypertension   . Sinoatrial node dysfunction (HCC)     Medications:  Scheduled:  . amoxicillin-clavulanate  1 tablet Oral BID  . guaiFENesin  600 mg Oral BID  . ipratropium  0.5 mg Nebulization BID  . levalbuterol  0.63 mg Nebulization BID  . lisinopril  20 mg Oral Daily  . metoprolol tartrate  25 mg Oral BID  . multivitamin with minerals  1 tablet Oral Daily  . senna-docusate  1 tablet Oral BID  . vitamin B-12  1,000 mcg Oral Daily  . Warfarin - Pharmacist Dosing Inpatient   Does not apply q1800   Assessment: 73 yoF with PMH Afib on warfarin PTA, SA node dysfunction s/p pacemaker, COPD, admitted 7/7 for CAP; Pharmacy to continue warfarin while admitted   Baseline INR elevated (consistent with acute infection)  Prior anticoagulation: warfarin 6 mg daily, last dose 7/6  Today, 05/24/2019:  Hgb decr 12.2 > 10.2, Plt 187  INR remains below therapeutic range at 1.6, Warfarin resumed 7/9 with large decrease in INR 3.7 > 2.3  Major drug interactions: broad spectrum abx may incr INR  Regular diet - intake much improved  Goal of Therapy: INR 2-3 [facility] flowsheet current as of  04/08/19)  Plan:  Warfarin 6mg  today at 1800  Daily INR, CBC in am  CBC at least q72 hr while on warfarin (last 7/11)  Monitor for signs of bleeding or thrombosis  Minda Ditto PharmD Pager [phone] 05/24/2019, 8:31 AM

## 2019-05-25 DIAGNOSIS — R0602 Shortness of breath: Secondary | ICD-10-CM

## 2019-05-25 DIAGNOSIS — J431 Panlobular emphysema: Secondary | ICD-10-CM

## 2019-05-25 DIAGNOSIS — I482 Chronic atrial fibrillation, unspecified: Secondary | ICD-10-CM

## 2019-05-25 LAB — CULTURE, BLOOD (ROUTINE X 2)
Culture: NO GROWTH
Culture: NO GROWTH
Special Requests: ADEQUATE
Special Requests: ADEQUATE

## 2019-05-25 LAB — BASIC METABOLIC PANEL
Anion gap: 9 (ref 5–15)
BUN: 16 mg/dL (ref 8–23)
CO2: 27 mmol/L (ref 22–32)
Calcium: 8.6 mg/dL — ABNORMAL LOW (ref 8.9–10.3)
Chloride: 100 mmol/L (ref 98–111)
Creatinine, Ser: 0.59 mg/dL (ref 0.44–1.00)
GFR calc Af Amer: >60 mL/min (ref >60)
GFR calc non Af Amer: >60 mL/min (ref >60)
Glucose, Bld: 108 mg/dL — ABNORMAL HIGH (ref 70–99)
Potassium: 3.9 mmol/L (ref 3.5–5.1)
Sodium: 136 mmol/L (ref 135–145)

## 2019-05-25 LAB — PROTIME-INR
INR: 1.7 — ABNORMAL HIGH (ref 0.8–1.2)
Prothrombin Time: 19.9 seconds — ABNORMAL HIGH (ref 11.4–15.2)

## 2019-05-25 LAB — GLUCOSE, CAPILLARY: Glucose-Capillary: 106 mg/dL — ABNORMAL HIGH (ref 70–99)

## 2019-05-25 LAB — MAGNESIUM: Magnesium: 2.3 mg/dL (ref 1.7–2.4)

## 2019-05-25 MED ORDER — LEVALBUTEROL TARTRATE 45 MCG/ACT IN AERO
1.0000 | INHALATION_SPRAY | Freq: Two times a day (BID) | RESPIRATORY_TRACT | Status: DC
  Administered 2019-05-25 – 2019-05-29 (×9): 1 via RESPIRATORY_TRACT
  Filled 2019-05-25 (×2): qty 15

## 2019-05-25 MED ORDER — IPRATROPIUM BROMIDE HFA 17 MCG/ACT IN AERS
2.0000 | INHALATION_SPRAY | Freq: Two times a day (BID) | RESPIRATORY_TRACT | Status: DC
  Administered 2019-05-25 – 2019-05-29 (×8): 2 via RESPIRATORY_TRACT
  Filled 2019-05-25 (×2): qty 12.9

## 2019-05-25 MED ORDER — WARFARIN SODIUM 5 MG PO TABS
7.5000 mg | ORAL_TABLET | Freq: Once | ORAL | Status: AC
  Administered 2019-05-25: 7.5 mg via ORAL
  Filled 2019-05-25: qty 1

## 2019-05-25 NOTE — Progress Notes (Signed)
Physical Therapy Treatment Patient Details Name: [name] MRN: [id number] DOB: [birth year] Today's Date: 05/25/2019    History of Present Illness 85 year old female admitted for nausea, vomiting and chills. Dx'd with pna.  PMH:  arthritis, endometrial CA, SA node dsyfunction, pacemaker, COPD, and HTN    PT Comments    Assisted OOB to amb to bathroom then to recliner.  Slightly unsteady and c/o knee weakness. Pt will need ST Rehab at SNF prior to safely returning home.   Follow Up Recommendations  SNF     Equipment Recommendations  None recommended by PT    Recommendations for Other Services       Precautions / Restrictions Precautions Precautions: Fall Precaution Comments: monitor O2 Restrictions Weight Bearing Restrictions: No    Mobility  Bed Mobility Overal bed mobility: Needs Assistance Bed Mobility: Supine to Sit     Supine to sit: Supervision     General bed mobility comments: increased time self able  Transfers Overall transfer level: Needs assistance Equipment used: Rolling walker (2 wheeled) Transfers: Sit to/from Stand Sit to Stand: Min guard         General transfer comment: good use of hands to steady self Limited distance and slightly unsteady  Ambulation/Gait Ambulation/Gait assistance: Min guard Gait Distance (Feet): 22 Feet Assistive device: Rolling walker (2 wheeled) Gait Pattern/deviations: Step-to pattern;Step-through pattern;Decreased step length - right;Decreased step length - left;Shuffle;Trunk flexed Gait velocity: decreased   General Gait Details: only amb to and from bathroom due to c/o knee weakness   Stairs             Wheelchair Mobility    Modified Rankin (Stroke Patients Only)       Balance                                            Cognition Arousal/Alertness: Awake/alert Behavior During Therapy: WFL for tasks assessed/performed Overall Cognitive Status: Within Functional  Limits for tasks assessed                                 General Comments: very pleasant      Exercises      General Comments        Pertinent Vitals/Pain Pain Assessment: No/denies pain    Home Living                      Prior Function            PT Goals (current goals can now be found in the care plan section) Progress towards PT goals: Progressing toward goals    Frequency    Min 3X/week      PT Plan Current plan remains appropriate    Co-evaluation              AM-PAC PT "6 Clicks" Mobility   Outcome Measure  Help needed turning from your back to your side while in a flat bed without using bedrails?: None Help needed moving from lying on your back to sitting on the side of a flat bed without using bedrails?: A Little Help needed moving to and from a bed to a chair (including a wheelchair)?: A Little Help needed standing up from a chair using your arms (e.g., wheelchair or bedside chair)?: A Little Help  needed to walk in hospital room?: A Little Help needed climbing 3-5 steps with a railing? : A Lot 6 Click Score: 18    End of Session Equipment Utilized During Treatment: Gait belt Activity Tolerance: Patient tolerated treatment well Patient left: in chair;with call bell/phone within reach;with chair alarm set Nurse Communication: Mobility status PT Visit Diagnosis: Difficulty in walking, not elsewhere classified (R26.2);Muscle weakness (generalized) (M62.81)     Time: [id number] PT Time Calculation (min) (ACUTE ONLY): 33 min  Charges:  $Gait Training: 8-22 mins $Therapeutic Activity: 8-22 mins                     {Dniya Neuhaus  PTA Acute  Rehabilitation Services Pager      [phone] Office      [phone]

## 2019-05-25 NOTE — Care Management Important Message (Signed)
Important Message  Patient Details IM Letter given to Sharren Bridge SW to present to the Patient Name: [name] MRN: [id number] Date of Birth: 04-Nov-1923   Medicare Important Message Given:  Yes     Kerin Salen 05/25/2019, 11:54 AM

## 2019-05-25 NOTE — TOC Progression Note (Signed)
Transition of Care [facility]) - Progression Note    Patient Details  Name: [name] MRN: [id number] Date of Birth: [birth year]  [facility] CM/SW [place], [place] Phone Number: [phone] 05/25/2019, 12:26 PM  Clinical Narrative:   [facility] received approved insurance authorization for pt to admit. Updated COVID19 test result pending needed prior to pt admitting to SNF    Expected Discharge Plan: [place] Barriers to Discharge: Continued Medical Work up  Expected Discharge Plan and Services Expected Discharge Plan: [place]   Discharge Planning Services: CM Consult Post Acute Care Choice: [place] arrangements for the past 2 months: Single Family Home Expected Discharge Date: 05/22/19                         HH Arranged: RN, PT, OT, Nurse's Aide, Social Work [employer] Agency: [place] Date [facility] Agency Contacted: 05/21/19 Time [place]: 1117 Representative spoke with at [place]: [place] ([place]) Interventions    Readmission Risk Interventions No flowsheet data found.

## 2019-05-25 NOTE — Progress Notes (Signed)
ANTICOAGULATION CONSULT NOTE  Pharmacy Consult for warfarin Indication: Afib  No Known Allergies  Patient Measurements: Height: 5\' 5"  (165.1 cm) Weight: 148 lb 14.4 oz (67.5 kg) IBW/kg (Calculated) : 57  Vital Signs: Temp: 98.4 F (36.9 C) (07/13 0445) BP: 146/81 (07/13 0445) Pulse Rate: 71 (07/13 0445)  Labs: Recent Labs    05/23/19 0557 05/24/19 0529 05/25/19 0529  HGB 10.2*  --   --   HCT 32.7*  --   --   PLT 187  --   --   LABPROT 17.8* 18.6* 19.9*  INR 1.5* 1.6* 1.7*  CREATININE 0.44  --  0.59   Estimated Creatinine Clearance: 38.7 mL/min (by C-G formula based on SCr of 0.59 mg/dL).  Medical History: Past Medical History:  Diagnosis Date  . Arthritis   . Atrial fibrillation ([place])   . COPD (chronic obstructive pulmonary disease) ([place])   . Hip pain, left   . Hypertension   . Sinoatrial node dysfunction (HCC)     Medications:  Scheduled:  . guaiFENesin  600 mg Oral BID  . ipratropium  0.5 mg Nebulization BID  . levalbuterol  0.63 mg Nebulization BID  . lisinopril  20 mg Oral Daily  . metoprolol tartrate  25 mg Oral BID  . multivitamin with minerals  1 tablet Oral Daily  . senna-docusate  1 tablet Oral BID  . vitamin B-12  1,000 mcg Oral Daily  . Warfarin - Pharmacist Dosing Inpatient   Does not apply q1800   Assessment: 12 yoF with PMH Afib on warfarin PTA, SA node dysfunction s/p pacemaker, COPD, admitted 7/7 for CAP; Pharmacy to continue warfarin while admitted   Baseline INR elevated (consistent with acute infection)  Prior anticoagulation: warfarin 6 mg daily, last dose 7/6  Today, 05/25/2019:  INR remains below therapeutic range at 1.7, Warfarin resumed 7/9 with large decrease in INR 3.7 > 2.3  Major drug interactions: broad spectrum abx may incr INR  Regular diet - intake much improved (nothing charted for 7/12)  Goal of Therapy: INR 2-3 [facility] flowsheet current as of 04/08/19)  Plan:  Warfarin 7.5mg  today at 1800  Daily INR, CBC in  am  CBC at least q72 hr while on warfarin (last 7/11)  Monitor for signs of bleeding or thrombosis  Dolly Rias RPh 05/25/2019, 11:47 AM Pager [phone]

## 2019-05-25 NOTE — Progress Notes (Signed)
PROGRESS NOTE  Fenton Foyempie H Sproull ZOX:[id number] DOB: May 18, 1923 DOA: 05/19/2019 PCP: [name], Edward, MD  Brief Summary:  From home , n/v at home, found to have multilobar pneumonia including right upper and right lower lobe,  acute hypoxia.  Improved, we have received  insurance authorization for sn[facility] placement at [facility], now still awaiting repeat COVID testing    HPI/Recap of past 24 hours:  awaiting for placement,   Remains weak but pleasant, no c/o's today    Assessment/Plan: Active Problems:   Essential hypertension   Atrial fibrillation (HCC)   COPD (chronic obstructive pulmonary disease) (HCC)   Arthropathy   PACEMAKER, PERMANENT   Sinoatrial node dysfunction (HCC)   Lobar pneumonia (HCC)   Acute respiratory failure with hypoxia (HCC)   Nausea and vomiting   FTT (failure to thrive) in adult   Lobar pneumonia with acute hypoxic respiratory failure Reports n/v, aspiration pneumonia? Ct chest with multilobar pneumonia on the right Blood culture no growth Urine strep antigen negative, urine legionella antigen negative SARS -COV2 screening negative, respiratory viral panel negative, mrsa screening negative Speech eval recommends regular diet/thin liquid She has improved on rocephin/zithromax, changed to augmentin ,she finished total of  5 days abx treatment in house - awaiting SNF placement, accepted by [facility], now awaiting COVID retest  H/o COPD? No wheezing on exam, not home o2 dependent She does has upper airway wheezing intermittently  Afib with SSS s/p pacemaker On coumadin with supratherapeutic INR on presentation Mostly paced rhythm, she is improving, tele d/ed Continue betablocker, coumadin per pharmacy, monitor INR     Chronic diastolic chf On lasix prn at home, Lasix x1 on 7/12  HTN:  bp low normal on presentation, lopressor does decreased initially, now bp is improving, increase lopressor back to home dose, gradualy restart lisinopril   Macrocytic anemia: mcv 105, hgb around 10 No sign of bleeding b12 is low, start b12 supplement tsh 0.6 retic inappropriately low Folate unremarkable Iron panel pending   FTT, she lives alone, uses a walker, PT/OT recommends snf,  She is very weak, it is not safe to discharge home , she agreed to discharge to snf  Code Status: full Family Communication: patient at bedside, son and daughter over the phone Disposition Plan: SNF placement pending repeat covid screening test for snf placement obtained on 7/12  Vinson Moselleob Konstantina Nachreiner MD  pgr [phone] 05/25/2019, 5:40 PM    Consultants:  none  Procedures:  none  Antibiotics:  Rocephin/zithro from admission to 7/9  augmentin from 7/9 to 7/11    Objective: BP 121/68 (BP Location: Right Arm)   Pulse 60   Temp 97.7 F (36.5 C) (Oral)   Resp 18   Ht 5\' 5"  (1.651 m)   Wt 67.5 kg   SpO2 97%   BMI 24.78 kg/m   Intake/Output Summary (Last 24 hours) at 05/25/2019 1738 Last data filed at 05/25/2019 1335 Gross per 24 hour  Intake -  Output 1400 ml  Net -1400 ml   Filed Weights   05/21/19 0600 05/24/19 0606 05/25/19 0445  Weight: 71 kg 67.5 kg 67.5 kg    Exam: Patient is examined daily including today on 05/25/2019, exams remain the same as of yesterday except that has changed    General:  Frail elderly, NAD, aaox3  Cardiovascular: RRR  Respiratory: CTABL  Abdomen: Soft/ND/NT, positive BS  Musculoskeletal: No Edema  Neuro: alert, oriented   Data Reviewed: Basic Metabolic Panel: Recent Labs  Lab 05/19/19 1800 05/20/19 0544 05/21/19  0501 05/22/19 0434 05/23/19 0557 05/25/19 0529  NA  --  137 137 134* 135 136  K  --  3.7 3.7 4.0 3.7 3.9  CL  --  105 101 99 100 100  CO2  --  28 27 27 27 27   GLUCOSE  --  101* 108* 105* 100* 108*  BUN  --  17 13 12 12 16   CREATININE  --  0.52 0.54 0.52 0.44 0.59  CALCIUM  --  7.9* 8.2* 8.2* 8.3* 8.6*  MG 2.0 2.0 2.0  --   --  2.3  PHOS 3.9 3.0  --   --   --   --    Liver  Function Tests: Recent Labs  Lab 05/19/19 1142 05/20/19 0544  AST 34 30  ALT 23 20  ALKPHOS 71 49  BILITOT 0.8 1.0  PROT 7.3 5.8*  ALBUMIN 3.5 2.8*   No results for input(s): LIPASE, AMYLASE in the last 168 hours. No results for input(s): AMMONIA in the last 168 hours. CBC: Recent Labs  Lab 05/19/19 1142 05/20/19 0544 05/21/19 0501 05/23/19 0557  WBC 11.7* 14.1* 9.2 6.2  NEUTROABS 10.7* 11.6* 7.2  --   HGB 12.2 9.9* 9.9* 10.2*  HCT 40.0 31.7* 33.0* 32.7*  MCV 103.4* 105.0* 105.4* 103.5*  PLT 208 168 138* 187   Cardiac Enzymes:   No results for input(s): CKTOTAL, CKMB, CKMBINDEX, TROPONINI in the last 168 hours. BNP (last 3 results) Recent Labs    05/19/19 1142  BNP 214.9*    ProBNP (last 3 results) No results for input(s): PROBNP in the last 8760 hours.  CBG: Recent Labs  Lab 05/21/19 0746 05/22/19 0803 05/23/19 0806 05/24/19 0746 05/25/19 0718  GLUCAP 99 105* 106* 101* 106*    Recent Results (from the past 240 hour(s))  SARS Coronavirus 2 (CEPHEID - Performed in [place] hospital lab), Hosp Order     Status: None   Collection Time: 05/19/19 11:42 AM   Specimen: Nasopharyngeal Swab  Result Value Ref Range Status   SARS Coronavirus 2 NEGATIVE NEGATIVE Final    Comment: (NOTE) If result is NEGATIVE SARS-CoV-2 target nucleic acids are NOT DETECTED. The SARS-CoV-2 RNA is generally detectable in upper and lower  respiratory specimens during the acute phase of infection. The lowest  concentration of SARS-CoV-2 viral copies this assay can detect is 250  copies / mL. A negative result does not preclude SARS-CoV-2 infection  and should not be used as the sole basis for treatment or other  patient management decisions.  A negative result may occur with  improper specimen collection / handling, submission of specimen other  than nasopharyngeal swab, presence of viral mutation(s) within the  areas targeted by this assay, and inadequate number of viral copies   (<250 copies / mL). A negative result must be combined with clinical  observations, patient history, and epidemiological information. If result is POSITIVE SARS-CoV-2 target nucleic acids are DETECTED. The SARS-CoV-2 RNA is generally detectable in upper and lower  respiratory specimens dur ing the acute phase of infection.  Positive  results are indicative of active infection with SARS-CoV-2.  Clinical  correlation with patient history and other diagnostic information is  necessary to determine patient infection status.  Positive results do  not rule out bacterial infection or co-infection with other viruses. If result is PRESUMPTIVE POSTIVE SARS-CoV-2 nucleic acids MAY BE PRESENT.   A presumptive positive result was obtained on the submitted specimen  and confirmed on repeat testing.  While 2019 novel coronavirus  (SARS-CoV-2) nucleic acids may be present in the submitted sample  additional confirmatory testing may be necessary for epidemiological  and / or clinical management purposes  to differentiate between  SARS-CoV-2 and other Sarbecovirus currently known to infect humans.  If clinically indicated additional testing with an alternate test  methodology [phone]([id number]) is advised. The SARS-CoV-2 RNA is generally  detectable in upper and lower respiratory sp ecimens during the acute  phase of infection. The expected result is Negative. Fact Sheet for Patients:  BoilerBrush.com.cyhttps://www.fda.gov/media/136312/download Fact Sheet for Healthcare Providers: https://pope.com/https://www.fda.gov/media/136313/download This test is not yet approved or cleared by the [place] FDA and has been authorized for detection and/or diagnosis of SARS-CoV-2 by FDA under an Emergency Use Authorization (EUA).  This EUA will remain in effect (meaning this test can be used) for the duration of the COVID-19 declaration under Section 564(b)(1) of the Act, 21 U.S.C. section 360bbb-3(b)(1), unless the authorization is terminated or  revoked sooner. Performed at [facility], [address] [place], [place] [address]   Culture, Urine     Status: None   Collection Time: 05/19/19  6:11 PM   Specimen: Urine, Random  Result Value Ref Range Status   Specimen Description   Final    URINE, RANDOM Performed at [facility], [address] [place], [place] [address]    Special Requests   Final    NONE Performed at [facility], [address] [place], [place] [address]    Culture   Final    NO GROWTH Performed at [facility], [address] [place], [place] [address]    Report Status 05/22/2019 FINAL  Final  Culture, blood (routine x 2)     Status: None   Collection Time: 05/19/19  7:01 PM   Specimen: BLOOD  Result Value Ref Range Status   Specimen Description   Final    BLOOD LEFT ANTECUBITAL Performed at [facility], [address] [place], [place] [address]    Special Requests   Final    BOTTLES DRAWN AEROBIC ONLY Blood Culture adequate volume Performed at [facility], [address] [place], [place] [address]    Culture   Final    NO GROWTH 5 DAYS Performed at [facility], [address] [place], [place] [address]    Report Status 05/25/2019 FINAL  Final  Culture, blood (routine x 2)     Status: None   Collection Time: 05/19/19  7:01 PM   Specimen: BLOOD LEFT HAND  Result Value Ref Range Status   Specimen Description   Final    BLOOD LEFT HAND Performed at [facility], [address] [place], [place] [address]    Special Requests   Final    BOTTLES DRAWN AEROBIC ONLY Blood Culture adequate volume Performed at [facility], [address] [place], [place] [address]    Culture   Final    NO GROWTH 5 DAYS Performed at [facility], [address] [place], [place] [address]    Report Status 05/25/2019 FINAL  Final  Respiratory Panel by  PCR     Status: None   Collection Time: 05/20/19  2:12 PM   Specimen: Nasopharyngeal Swab; Respiratory  Result Value Ref Range Status   Adenovirus NOT DETECTED NOT DETECTED Final   Coronavirus 229E NOT DETECTED NOT DETECTED Final    Comment: (NOTE) The Coronavirus  on the Respiratory Panel, DOES NOT test for the novel  Coronavirus (2019 nCoV)    Coronavirus HKU1 NOT DETECTED NOT DETECTED Final   Coronavirus NL63 NOT DETECTED NOT DETECTED Final   Coronavirus OC43 NOT DETECTED NOT DETECTED Final   Metapneumovirus NOT DETECTED NOT DETECTED Final   Rhinovirus / Enterovirus NOT DETECTED NOT DETECTED Final   Influenza A NOT DETECTED NOT DETECTED Final   Influenza B NOT DETECTED NOT DETECTED Final   Parainfluenza Virus 1 NOT DETECTED NOT DETECTED Final   Parainfluenza Virus 2 NOT DETECTED NOT DETECTED Final   Parainfluenza Virus 3 NOT DETECTED NOT DETECTED Final   Parainfluenza Virus 4 NOT DETECTED NOT DETECTED Final   Respiratory Syncytial Virus NOT DETECTED NOT DETECTED Final   Bordetella pertussis NOT DETECTED NOT DETECTED Final   Chlamydophila pneumoniae NOT DETECTED NOT DETECTED Final   Mycoplasma pneumoniae NOT DETECTED NOT DETECTED Final    Comment: Performed at [facility], [address] [place], [place] [address]  MRSA PCR Screening     Status: None   Collection Time: 05/20/19  2:12 PM   Specimen: Nasal Mucosa; Nasopharyngeal  Result Value Ref Range Status   MRSA by PCR NEGATIVE NEGATIVE Final    Comment:        The GeneXpert MRSA Assay (FDA approved for NASAL specimens only), is one component of a comprehensive MRSA colonization surveillance program. It is not intended to diagnose MRSA infection nor to guide or monitor treatment for MRSA infections. Performed at [facility], [address] [place], [place] [address]      Studies: No results found.  Scheduled Meds: . guaiFENesin  600 mg Oral BID  . ipratropium  2 puff Inhalation BID  .  levalbuterol  1 puff Inhalation BID  . lisinopril  20 mg Oral Daily  . metoprolol tartrate  25 mg Oral BID  . multivitamin with minerals  1 tablet Oral Daily  . senna-docusate  1 tablet Oral BID  . vitamin B-12  1,000 mcg Oral Daily  . Warfarin - Pharmacist Dosing Inpatient   Does not apply q1800    Continuous Infusions:    Time spent: 25mins I have personally reviewed and interpreted on  05/25/2019 daily labs, imagings as discussed above under date review session and assessment and plans.  I reviewed all nursing notes, pharmacy notes, vitals, pertinent old records  I have discussed plan of care as described above with RN , patient on 05/25/2019   Maree Krabbeobert D Le Ferraz MD, PhD  [facility] Pager [phone]. If 7PM-7AM, please contact night-coverage at [email or web address], password [email or web address] 05/25/2019, 5:38 PM  LOS: 6 days

## 2019-05-26 DIAGNOSIS — U071 COVID-19: Secondary | ICD-10-CM | POA: Diagnosis present

## 2019-05-26 DIAGNOSIS — J1289 Other viral pneumonia: Secondary | ICD-10-CM

## 2019-05-26 DIAGNOSIS — I4819 Other persistent atrial fibrillation: Secondary | ICD-10-CM

## 2019-05-26 LAB — CBC
HCT: 37 % (ref 36.0–46.0)
Hemoglobin: 11.4 g/dL — ABNORMAL LOW (ref 12.0–15.0)
MCH: 31.9 pg (ref 26.0–34.0)
MCHC: 30.8 g/dL (ref 30.0–36.0)
MCV: 103.6 fL — ABNORMAL HIGH (ref 80.0–100.0)
Platelets: 254 10*3/uL (ref 150–400)
RBC: 3.57 MIL/uL — ABNORMAL LOW (ref 3.87–5.11)
RDW: 16.2 % — ABNORMAL HIGH (ref 11.5–15.5)
WBC: 6.1 10*3/uL (ref 4.0–10.5)
nRBC: 0 % (ref 0.0–0.2)

## 2019-05-26 LAB — PROTIME-INR
INR: 1.7 — ABNORMAL HIGH (ref 0.8–1.2)
Prothrombin Time: 19.7 seconds — ABNORMAL HIGH (ref 11.4–15.2)

## 2019-05-26 LAB — GLUCOSE, CAPILLARY: Glucose-Capillary: 88 mg/dL (ref 70–99)

## 2019-05-26 LAB — NOVEL CORONAVIRUS, NAA (HOSP ORDER, SEND-OUT TO REF LAB; TAT 18-24 HRS): SARS-CoV-2, NAA: DETECTED — AB

## 2019-05-26 MED ORDER — WARFARIN SODIUM 5 MG PO TABS
7.5000 mg | ORAL_TABLET | Freq: Once | ORAL | Status: AC
  Administered 2019-05-26: 7.5 mg via ORAL
  Filled 2019-05-26: qty 1

## 2019-05-26 MED ORDER — LISINOPRIL 20 MG PO TABS
40.0000 mg | ORAL_TABLET | Freq: Every day | ORAL | Status: DC
  Administered 2019-05-27 – 2019-06-02 (×7): 40 mg via ORAL
  Filled 2019-05-26 (×7): qty 2

## 2019-05-26 NOTE — Progress Notes (Signed)
Spoke with [name] regarding patient status. Questions answered and education provided.

## 2019-05-26 NOTE — Progress Notes (Signed)
CRITICAL VALUE ALERT  Critical Value:  COVID positive  Date & Time Notied:  05/26/19 at 1510  Provider Notified: Dr. Jonnie Finner  Orders Received/Actions taken: Orders received. Will continue to monitor.

## 2019-05-26 NOTE — TOC Progression Note (Signed)
Transition of Care [facility]) - Progression Note    Patient Details  Name: [name] MRN: [id number] Date of Birth: 10/17/1923  Transition of Care [facility]) CM/SW [place], LCSW Phone Number: 05/26/2019, 2:32 PM  Clinical Narrative:    Patient tested positive for Covid she will be unable to go to [facility] for [facility]   Expected Discharge Plan: [place] Barriers to Discharge: Continued Medical Work up  Expected Discharge Plan and Services Expected Discharge Plan: [place]   Discharge Planning Services: CM Consult Post Acute Care Choice: [place] arrangements for the past 2 months: Single Family Home Expected Discharge Date: 05/22/19                         HH Arranged: RN, PT, OT, Nurse's Aide, Social Work [place] Agency: [place] Date [facility] Agency Contacted: 05/21/19 Time [place]: 1117 Representative spoke with at [place]: Tommi Rumps   Social Determinants of Health ([place]) Interventions    Readmission Risk Interventions No flowsheet data found.

## 2019-05-26 NOTE — Progress Notes (Signed)
Patients COVID test resulted positive. Dr. Jonnie Finner MD made aware as well as charge nurse, Mable Fill. Pt transported to 1437 and placed on precautions.

## 2019-05-26 NOTE — Progress Notes (Signed)
[place] for warfarin Indication: A-fib  No Known Allergies  Patient Measurements: Height: 5\' 5"  (165.1 cm) Weight: 145 lb 8 oz (66 kg) IBW/kg (Calculated) : 57  Vital Signs: Temp: 97.7 F (36.5 C) (07/14 0531) Temp Source: Oral (07/14 0531) BP: 175/101 (07/14 0853) Pulse Rate: 72 (07/14 0855)  Labs: Recent Labs    05/24/19 0529 05/25/19 0529 05/26/19 0545  HGB  --   --  11.4*  HCT  --   --  37.0  PLT  --   --  254  LABPROT 18.6* 19.9* 19.7*  INR 1.6* 1.7* 1.7*  CREATININE  --  0.59  --    Estimated Creatinine Clearance: 38.7 mL/min (by C-G formula based on SCr of 0.59 mg/dL).  Medical History: Past Medical History:  Diagnosis Date  . Arthritis   . Atrial fibrillation ([place])   . COPD (chronic obstructive pulmonary disease) ([place])   . Hip pain, left   . Hypertension   . Sinoatrial node dysfunction (HCC)     Assessment: 89 yoF with PMH Afib on warfarin PTA, SA node dysfunction s/p pacemaker, COPD, admitted 7/7 for CAP. Pharmacy to continue warfarin while admitted.    Baseline INR elevated (consistent with acute infection)  Prior anticoagulation: warfarin 6 mg daily, last dose 7/6  Warfarin resumed 7/9 once INR returned to therapeutic range  Today, 05/26/2019:  INR remains below therapeutic range at 1.7  CBC: Hgb improved to 11.4, Pltc WNL   Major drug interactions: broad spectrum abx discontinued after 5 day course of treatment   Heart healthy diet, 25% of breakfast charted this AM  Goal of Therapy: INR 2-3  Plan:  Repeat higher dose of Warfarin 7.5mg  PO x 1 today   Daily INR  CBC at least q72h while on warfarin   Monitor for s/sx of bleeding    Lindell Spar, PharmD, BCPS Pager: [phone] 05/26/2019, 11:24 AM

## 2019-05-26 NOTE — Progress Notes (Signed)
PROGRESS NOTE  [name] ZOX:[id number] DOB: 1923/11/12 DOA: 05/19/2019 PCP: [name], Edward, MD  Brief Summary:  From home , n/v at home, found to have multilobar pneumonia including right upper and right lower lobe,  acute hypoxia.  Improved, we have received  insurance authorization for snf placement at [facility], now still awaiting repeat COVID testing  HPI/Recap of past 24 hours: Pt is w/o any new c/o's.  No cough/ SOB.   Repeat COVID from 7/12 is +, pt being moved to 4W negative pressure room.   Afebrile    Assessment/Plan: Active Problems:   COVID + PNA   Essential hypertension   Atrial fibrillation (HCC)   COPD (chronic obstructive pulmonary disease) (HCC)   Arthropathy   PACEMAKER, PERMANENT   Sinoatrial node dysfunction (HCC)   Acute respiratory failure with hypoxia (HCC)   Nausea and vomiting   FTT (failure to thrive) in adult   SOB (shortness of breath)   Lobar pneumonia/ now COVID+  with acute hypoxic respiratory failure Ct chest with multilobar pneumonia on the right Blood culture no growth Urine strep antigen negative, urine legionella antigen negative SARS -COV2 screening negative, respiratory viral panel negative, mrsa screening negative Speech eval recommends regular diet/thin liquid She improved on rocephin/zithromax and finished total of  5 days abx treatment in house Repeat COVID test for SNF placement is + from 7/12 >   - moving to neg pressure room on 4W  - patient clinically stable and sig improved from admit when she was on O2  - updated pt's son about +COVID and disposition issues that this brings   - [facility] transfer orders written  H/o COPD? No wheezing on exam, not home o2 dependent She does has upper airway wheezing intermittently  Afib with SSS s/p pacemaker On coumadin with supratherapeutic INR on presentation Mostly paced rhythm, she is improving, tele d/ed Continue betablocker, coumadin per pharmacy, monitor INR , 1.7 today   Chronic diastolic chf On lasix prn at home, Lasix x1 on 7/12  HTN:  bp low normal on presentation then returned to normal / high - resumed metoprolol home dose  - resume lisinopril today, BP's still high   Macrocytic anemia: mcv 105, hgb around 10 No sign of bleeding b12 is low, start b12 supplement tsh 0.6 retic inappropriately low Folate unremarkable Iron panel pending   FTT, she lives alone, uses a walker, PT/OT recommended snf as she is very weak, it is not safe to discharge home  Code Status: full Family Communication: patient at bedside, son and daughter over the phone Disposition Plan: pt is now COVID+, not sure disposition now, SNF declined to take her of course  Vinson MoselleRob Nyasiah Moffet MD  pgr [phone] 05/26/2019, 1:18 PM    Consultants:  none  Procedures:  none  Antibiotics:  Rocephin/zithro from admission to 7/9  augmentin from 7/9 to 7/11    Objective: BP (!) 175/101 (BP Location: Left Arm)   Pulse 72   Temp 97.7 F (36.5 C) (Oral)   Resp 18   Ht 5\' 5"  (1.651 m)   Wt 66 kg   SpO2 93%   BMI 24.21 kg/m   Intake/Output Summary (Last 24 hours) at 05/26/2019 1318 Last data filed at 05/26/2019 1000 Gross per 24 hour  Intake 240 ml  Output 2000 ml  Net -1760 ml   Filed Weights   05/24/19 0606 05/25/19 0445 05/26/19 0529  Weight: 67.5 kg 67.5 kg 66 kg    Exam: Patient is examined  daily including today on 05/26/2019, exams remain the same as of yesterday except that has changed    General:  Frail elderly, NAD, aaox3  Cardiovascular: RRR  Respiratory: CTABL  Abdomen: Soft/ND/NT, positive BS  Musculoskeletal: No Edema  Neuro: alert, oriented   Data Reviewed: Basic Metabolic Panel: Recent Labs  Lab 05/19/19 1800 05/20/19 0544 05/21/19 0501 05/22/19 0434 05/23/19 0557 05/25/19 0529  NA  --  137 137 134* 135 136  K  --  3.7 3.7 4.0 3.7 3.9  CL  --  105 101 99 100 100  CO2  --  28 27 27 27 27   GLUCOSE  --  101* 108* 105* 100* 108*   BUN  --  17 13 12 12 16   CREATININE  --  0.52 0.54 0.52 0.44 0.59  CALCIUM  --  7.9* 8.2* 8.2* 8.3* 8.6*  MG 2.0 2.0 2.0  --   --  2.3  PHOS 3.9 3.0  --   --   --   --    Liver Function Tests: Recent Labs  Lab 05/20/19 0544  AST 30  ALT 20  ALKPHOS 49  BILITOT 1.0  PROT 5.8*  ALBUMIN 2.8*   No results for input(s): LIPASE, AMYLASE in the last 168 hours. No results for input(s): AMMONIA in the last 168 hours. CBC: Recent Labs  Lab 05/20/19 0544 05/21/19 0501 05/23/19 0557 05/26/19 0545  WBC 14.1* 9.2 6.2 6.1  NEUTROABS 11.6* 7.2  --   --   HGB 9.9* 9.9* 10.2* 11.4*  HCT 31.7* 33.0* 32.7* 37.0  MCV 105.0* 105.4* 103.5* 103.6*  PLT 168 138* 187 254   Cardiac Enzymes:   No results for input(s): CKTOTAL, CKMB, CKMBINDEX, TROPONINI in the last 168 hours. BNP (last 3 results) Recent Labs    05/19/19 1142  BNP 214.9*    ProBNP (last 3 results) No results for input(s): PROBNP in the last 8760 hours.  CBG: Recent Labs  Lab 05/22/19 0803 05/23/19 0806 05/24/19 0746 05/25/19 0718 05/26/19 0755  GLUCAP 105* 106* 101* 106* 88    Recent Results (from the past 240 hour(s))  SARS Coronavirus 2 (CEPHEID - Performed in [place] hospital lab), Hosp Order     Status: None   Collection Time: 05/19/19 11:42 AM   Specimen: Nasopharyngeal Swab  Result Value Ref Range Status   SARS Coronavirus 2 NEGATIVE NEGATIVE Final    Comment: (NOTE) If result is NEGATIVE SARS-CoV-2 target nucleic acids are NOT DETECTED. The SARS-CoV-2 RNA is generally detectable in upper and lower  respiratory specimens during the acute phase of infection. The lowest  concentration of SARS-CoV-2 viral copies this assay can detect is 250  copies / mL. A negative result does not preclude SARS-CoV-2 infection  and should not be used as the sole basis for treatment or other  patient management decisions.  A negative result may occur with  improper specimen collection / handling, submission of  specimen other  than nasopharyngeal swab, presence of viral mutation(s) within the  areas targeted by this assay, and inadequate number of viral copies  (<250 copies / mL). A negative result must be combined with clinical  observations, patient history, and epidemiological information. If result is POSITIVE SARS-CoV-2 target nucleic acids are DETECTED. The SARS-CoV-2 RNA is generally detectable in upper and lower  respiratory specimens dur ing the acute phase of infection.  Positive  results are indicative of active infection with SARS-CoV-2.  Clinical  correlation with patient history and other diagnostic  information is  necessary to determine patient infection status.  Positive results do  not rule out bacterial infection or co-infection with other viruses. If result is PRESUMPTIVE POSTIVE SARS-CoV-2 nucleic acids MAY BE PRESENT.   A presumptive positive result was obtained on the submitted specimen  and confirmed on repeat testing.  While 2019 novel coronavirus  (SARS-CoV-2) nucleic acids may be present in the submitted sample  additional confirmatory testing may be necessary for epidemiological  and / or clinical management purposes  to differentiate between  SARS-CoV-2 and other Sarbecovirus currently known to infect humans.  If clinically indicated additional testing with an alternate test  methodology [phone]([id number]) is advised. The SARS-CoV-2 RNA is generally  detectable in upper and lower respiratory sp ecimens during the acute  phase of infection. The expected result is Negative. Fact Sheet for Patients:  BoilerBrush.com.cyhttps://www.fda.gov/media/136312/download Fact Sheet for Healthcare Providers: https://pope.com/https://www.fda.gov/media/136313/download This test is not yet approved or cleared by the [place] FDA and has been authorized for detection and/or diagnosis of SARS-CoV-2 by FDA under an Emergency Use Authorization (EUA).  This EUA will remain in effect (meaning this test can be used) for the  duration of the COVID-19 declaration under Section 564(b)(1) of the Act, 21 U.S.C. section 360bbb-3(b)(1), unless the authorization is terminated or revoked sooner. Performed at [facility], [address] [place], [place] [address]   Culture, Urine     Status: None   Collection Time: 05/19/19  6:11 PM   Specimen: Urine, Random  Result Value Ref Range Status   Specimen Description   Final    URINE, RANDOM Performed at [facility], [address] [place], [place] [address]    Special Requests   Final    NONE Performed at [facility], [address] [place], [place] [address]    Culture   Final    NO GROWTH Performed at [facility], [address] [place], [place] [address]    Report Status 05/22/2019 FINAL  Final  Culture, blood (routine x 2)     Status: None   Collection Time: 05/19/19  7:01 PM   Specimen: BLOOD  Result Value Ref Range Status   Specimen Description   Final    BLOOD LEFT ANTECUBITAL Performed at [facility], [address] [place], [place] [address]    Special Requests   Final    BOTTLES DRAWN AEROBIC ONLY Blood Culture adequate volume Performed at [facility], [address] [place], [place] [address]    Culture   Final    NO GROWTH 5 DAYS Performed at [facility], [address] [place], [place] [address]    Report Status 05/25/2019 FINAL  Final  Culture, blood (routine x 2)     Status: None   Collection Time: 05/19/19  7:01 PM   Specimen: BLOOD LEFT HAND  Result Value Ref Range Status   Specimen Description   Final    BLOOD LEFT HAND Performed at [facility], [address] [place], [place] [address]    Special Requests   Final    BOTTLES DRAWN AEROBIC ONLY Blood Culture adequate volume Performed at [facility], [address] [place], [place] [address]    Culture   Final    NO GROWTH  5 DAYS Performed at [facility], [address] [place], [place] [address]    Report Status 05/25/2019 FINAL  Final  Respiratory Panel by PCR     Status: None   Collection Time: 05/20/19  2:12 PM   Specimen: Nasopharyngeal Swab; Respiratory  Result Value Ref Range Status   Adenovirus NOT DETECTED NOT DETECTED Final   Coronavirus 229E NOT DETECTED NOT DETECTED Final    Comment: (NOTE) The Coronavirus on the Respiratory Panel, DOES NOT test for the novel  Coronavirus (2019 nCoV)    Coronavirus HKU1 NOT DETECTED NOT DETECTED Final   Coronavirus NL63 NOT DETECTED NOT DETECTED Final   Coronavirus OC43 NOT DETECTED NOT DETECTED Final   Metapneumovirus NOT DETECTED NOT DETECTED Final   Rhinovirus / Enterovirus NOT DETECTED NOT DETECTED Final   Influenza A NOT DETECTED NOT DETECTED Final   Influenza B NOT DETECTED NOT DETECTED Final   Parainfluenza Virus 1 NOT DETECTED NOT DETECTED Final   Parainfluenza Virus 2 NOT DETECTED NOT DETECTED Final   Parainfluenza Virus 3 NOT DETECTED NOT DETECTED Final   Parainfluenza Virus 4 NOT DETECTED NOT DETECTED Final   Respiratory Syncytial Virus NOT DETECTED NOT DETECTED Final   Bordetella pertussis NOT DETECTED NOT DETECTED Final   Chlamydophila pneumoniae NOT DETECTED NOT DETECTED Final   Mycoplasma pneumoniae NOT DETECTED NOT DETECTED Final    Comment: Performed at [facility], [address] [place], [place] [address]  MRSA PCR Screening     Status: None   Collection Time: 05/20/19  2:12 PM   Specimen: Nasal Mucosa; Nasopharyngeal  Result Value Ref Range Status   MRSA by PCR NEGATIVE NEGATIVE Final    Comment:        The GeneXpert MRSA Assay (FDA approved for NASAL specimens only), is one component of a comprehensive MRSA colonization surveillance program. It is not intended to diagnose MRSA infection nor to guide or monitor treatment for MRSA infections. Performed at [facility], [address]  [place], [place] [address]   Novel Coronavirus, NAA (hospital order; send-out to ref lab)     Status: Abnormal   Collection Time: 05/24/19  1:28 PM   Specimen: Nasopharyngeal Swab; Respiratory  Result Value Ref Range Status   SARS-CoV-2, NAA DETECTED (A) NOT DETECTED Final    Comment: (NOTE)                  Client Requested Flag This test was developed and its performance characteristics determined by [facility]. This test has not been FDA cleared or approved. This test has been authorized by FDA under an Emergency Use Authorization (EUA). This test is only authorized for the duration of time the declaration that circumstances exist justifying the authorization of the emergency use of in vitro diagnostic tests for detection of SARS-CoV-2 virus and/or diagnosis of COVID-19 infection under section 564(b)(1) of the Act, 21 U.S.C. 191YNW-2(N)(5360bbb-3(b)(1), unless the authorization is terminated or revoked sooner. When diagnostic testing is negative, the possibility of a false negative result should be considered in the context of a patient's recent exposures and the presence of clinical signs and symptoms consistent with COVID-19. An individual without symptoms of COVID-19 and who is not shedding SARS-CoV-2 virus would expect to have a negative (not det ected) result in this assay. Performed At: [facility] [address] [place], [place] [address] Jolene SchimkeNagendra Sanjai MD QI:[id number]:[phone]    Coronavirus Source NASOPHARYNGEAL  Final    Comment: Performed at [facility], [address] [place], [place] [address]     Studies: No results found.  Scheduled Meds: . guaiFENesin  600  mg Oral BID  . ipratropium  2 puff Inhalation BID  . levalbuterol  1 puff Inhalation BID  . lisinopril  20 mg Oral Daily  . metoprolol tartrate  25 mg Oral BID  . multivitamin with minerals  1 tablet Oral Daily  . senna-docusate  1 tablet Oral BID  . vitamin B-12  1,000 mcg Oral Daily  .  warfarin  7.5 mg Oral ONCE-1800  . Warfarin - Pharmacist Dosing Inpatient   Does not apply q1800    Continuous Infusions:    Time spent: I have personally reviewed and interpreted on  05/26/2019 daily labs, imagings as discussed above under date review session and assessment and plans.  I reviewed all nursing notes, pharmacy notes, vitals, pertinent old records  I have discussed plan of care as described above with RN , patient on 05/26/2019   Maree Krabbe MD, PhD  [facility] Pager [phone]. If 7PM-7AM, please contact night-coverage at [email or web address], password [facility] 05/26/2019, 1:18 PM  LOS: 7 days

## 2019-05-27 LAB — PROTIME-INR
INR: 1.6 — ABNORMAL HIGH (ref 0.8–1.2)
Prothrombin Time: 19.2 seconds — ABNORMAL HIGH (ref 11.4–15.2)

## 2019-05-27 LAB — GLUCOSE, CAPILLARY: Glucose-Capillary: 100 mg/dL — ABNORMAL HIGH (ref 70–99)

## 2019-05-27 MED ORDER — WARFARIN SODIUM 5 MG PO TABS
10.0000 mg | ORAL_TABLET | Freq: Once | ORAL | Status: AC
  Administered 2019-05-27: 10 mg via ORAL
  Filled 2019-05-27: qty 2

## 2019-05-27 NOTE — Progress Notes (Signed)
Pt arrived to the unit at approximately 1630 from [facility], via [facility].she was transferred from stretcher to the bed.  She was resting comfortably in bed when I assessed her at approximately 1655.  She said that she was happy to be here at [facility], finally. Pt was orientated to the unit.  I phoned the Pt's contact person, advised her of the Pt's arrival to the unit, and gave her the phone number to the Pt's room.  The two were able to talk for a couple of minutes.

## 2019-05-27 NOTE — Plan of Care (Signed)
  Problem: Health Behavior/Discharge Planning: Goal: Ability to manage health-related needs will improve Outcome: Progressing   Problem: Clinical Measurements: Goal: Ability to maintain clinical measurements within normal limits will improve Outcome: Progressing Goal: Will remain free from infection Outcome: Progressing Goal: Diagnostic test results will improve Outcome: Progressing   Problem: Nutrition: Goal: Adequate nutrition will be maintained Outcome: Progressing   Problem: Education: Goal: Knowledge of risk factors and measures for prevention of condition will improve Outcome: Progressing   Problem: Coping: Goal: Psychosocial and spiritual needs will be supported Outcome: Progressing   Problem: Respiratory: Goal: Will maintain a patent airway Outcome: Progressing Goal: Complications related to the disease process, condition or treatment will be avoided or minimized Outcome: Progressing

## 2019-05-27 NOTE — Progress Notes (Signed)
Occupational Therapy Treatment Patient Details Name: [name] MRN: [id number] DOB: 04-Dec-1922 Today's Date: 05/27/2019    History of present illness 85 year old female admitted for nausea, vomiting and chills. Dx'd with pna.  PMH:  arthritis, endometrial CA, SA node dsyfunction, pacemaker, COPD, and HTN   OT comments  Pt ambulated to sink and around room with min guard; supervision standing at sink for hair and teeth   Follow Up Recommendations  SNF    Equipment Recommendations  None recommended by OT    Recommendations for Other Services      Precautions / Restrictions Precautions Precautions: Fall Precaution Comments: monitor O2 Restrictions Weight Bearing Restrictions: No       Mobility Bed Mobility               General bed mobility comments: in chair  Transfers   Equipment used: Rolling walker (2 wheeled)   Sit to Stand: Min guard         General transfer comment: for safety    Balance                                           ADL either performed or assessed with clinical judgement   ADL       Grooming: Oral care;Brushing hair;Supervision/safety;Standing                   Toilet Transfer: Min guard;Ambulation;RW(chair)             General ADL Comments: ambulated around rrom with min guard.  no toileting needs and pt had performed adl on night shift     Vision       Perception     Praxis      Cognition Arousal/Alertness: Awake/alert Behavior During Therapy: WFL for tasks assessed/performed Overall Cognitive Status: Within Functional Limits for tasks assessed                                 General Comments: very pleasant        Exercises     Shoulder Instructions       General Comments      Pertinent Vitals/ Pain       Pain Assessment: No/denies pain  Home Living                                          Prior Functioning/Environment              Frequency  Min 2X/week        Progress Toward Goals  OT Goals(current goals can now be found in the care plan section)  Progress towards OT goals: Progressing toward goals     Plan      Co-evaluation                 AM-PAC OT "6 Clicks" Daily Activity     Outcome Measure   Help from another person eating meals?: None Help from another person taking care of personal grooming?: A Little Help from another person toileting, which includes using toliet, bedpan, or urinal?: A Little Help from another person bathing (including washing, rinsing, drying)?: A Little Help from another person to put on and  taking off regular upper body clothing?: A Little Help from another person to put on and taking off regular lower body clothing?: A Little 6 Click Score: 19    End of Session    OT Visit Diagnosis: Muscle weakness (generalized) (M62.81);Unsteadiness on feet (R26.81)   Activity Tolerance Patient tolerated treatment well   Patient Left in chair;with call bell/phone within reach;with chair alarm set   Nurse Communication          Time: 6073-7106 OT Time Calculation (min): 21 min  Charges: OT General Charges $OT Visit: 1 Visit OT Treatments $Self Care/Home Management : 8-22 mins  Lesle Chris, OTR/L Acute Rehabilitation Services [phone] [facility] pager [phone] office 05/27/2019   [place] 05/27/2019, 11:59 AM

## 2019-05-27 NOTE — Progress Notes (Signed)
[facility]                                                                              Patient Demographics  [name], is a 85 y.o. female, DOB - 05-15-1923, ZOX:[id number]  Admit date - 05/19/2019   Admitting Physician Merlene Laughtermair Latif Sheikh, DO  Outpatient Primary MD for the patient is [name], Edward, MD  Outpatient specialists:   LOS - 8  days   Medical records reviewed and are as summarized below:    Chief Complaint  Patient presents with  . Fever  . Cough       Brief summary   Patient is a 85 year old female with history of arthritis, hip fracture, endometrial carcinoma, sinoatrial node dysfunction, pacemaker, atrial fibrillation on anticoagulation with Coumadin, COPD, hypertension presented from home with nausea and vomiting, chills and subsequently shortness of breath.  Patient woke up on the day of admission not feeling well and started vomiting, vomited multiple times.  EMS was called by her stepdaughter, initially she was not short of breath but then started wheezing.  Patient was started on a Zithromax and ceftriaxone for pneumonia in ED presumed to be community-acquired pneumonia. Initial COVID-19 test in ED was negative.  Respiratory virus panel negative. Patient's respiratory status however continue to worsen with hypoxia.  Repeat COVID test done for SNF placement was positive.  Assessment & Plan    Principal Problem:   Pneumonia due to COVID-19 virus, acute respiratory failure with hypoxia -Initial cover test in ED was negative, respiratory virus panel negative however during hospitalization patient respiratory status did not improve and continued to worsen with hypoxia.  She had completed antibiotic course for pneumonia. -Urine Legionella antigen, urine strep antigen negative, blood culture showed no growth. -CT chest showed multilobar pneumonia on the right -Repeat COVID test for SNF placement was positive from 7/12 -Slowly improving, O2  sats today 99% on room air -Continue Xopenex inhalers   Active Problems:   COPD (chronic obstructive pulmonary disease) (HCC) -Patient is not O2 dependent at home, had wheezing intermittently during hospitalization -Continue Xopenex    Essential hypertension -BP currently stable    Atrial fibrillation with SSS status post pacemaker (HCC) -Continue beta-blocker, Coumadin per pharmacy   Chronic diastolic CHF -On Lasix as needed at home, received Lasix x1 on 7/12 -Currently stable, euvolemic  Essential hypertension -Initial BP low on presentation, -Now stable, resumed metoprolol, lisinopril  Macrocytic anemia MCV 105, currently no signs of bleeding, B12 low 141 Placed on B12 supplement  Failure to thrive -PT evaluation recommended skilled nursing facility, was living home alone PTA.  Code Status: Full code DVT Prophylaxis: Warfarin Family Communication: Discussed in detail with the patient, all imaging results, lab results explained to the patient    Disposition Plan: Now COVID positive, skilled nursing facility has declined until COVID negative, unclear disposition. Plan for transfer to [facility]    Time Spent in minutes   35 minutes  Procedures:  None  Consultants:   None  Antimicrobials:   Anti-infectives (From admission, onward)   Start     Dose/Rate Route Frequency Ordered Stop   05/21/19 2100  amoxicillin-clavulanate (  AUGMENTIN) 500-125 MG per tablet 500 mg  Status:  Discontinued     1 tablet Oral 2 times daily 05/21/19 1651 05/24/19 0903   05/20/19 1500  azithromycin (ZITHROMAX) 500 mg in sodium chloride 0.9 % 250 mL IVPB  Status:  Discontinued     500 mg 250 mL/hr over 60 Minutes Intravenous Every 24 hours 05/19/19 1717 05/21/19 1651   05/20/19 1400  cefTRIAXone (ROCEPHIN) 2 g in sodium chloride 0.9 % 100 mL IVPB  Status:  Discontinued     2 g 200 mL/hr over 30 Minutes Intravenous Every 24 hours 05/19/19 1717 05/21/19 1651   05/19/19 1345  cefTRIAXone  (ROCEPHIN) 1 g in sodium chloride 0.9 % 100 mL IVPB     1 g 200 mL/hr over 30 Minutes Intravenous  Once 05/19/19 1338 05/19/19 1516   05/19/19 1345  azithromycin (ZITHROMAX) 500 mg in sodium chloride 0.9 % 250 mL IVPB     500 mg 250 mL/hr over 60 Minutes Intravenous  Once 05/19/19 1338 05/19/19 1624          Medications  Scheduled Meds: . guaiFENesin  600 mg Oral BID  . ipratropium  2 puff Inhalation BID  . levalbuterol  1 puff Inhalation BID  . lisinopril  40 mg Oral Daily  . metoprolol tartrate  25 mg Oral BID  . multivitamin with minerals  1 tablet Oral Daily  . senna-docusate  1 tablet Oral BID  . vitamin B-12  1,000 mcg Oral Daily  . Warfarin - Pharmacist Dosing Inpatient   Does not apply q1800   Continuous Infusions: PRN Meds:.acetaminophen **OR** acetaminophen, bisacodyl, levalbuterol, ondansetron **OR** ondansetron (ZOFRAN) IV, polyethylene glycol      Subjective:   Lashanda Luft was seen and examined today. No acute complaints except generalized weakness, fatigue. Patient denies dizziness, chest pain, abdominal pain, N/V/D/C, tingling. No acute events overnight.    Objective:   Vitals:   05/26/19 2200 05/26/19 2209 05/27/19 0635 05/27/19 1352  BP: 138/76 138/76 (!) 151/75 128/76  Pulse: 63 63 70 64  Resp: 18  16 19   Temp: 97.6 F (36.4 C)  97.7 F (36.5 C) 97.9 F (36.6 C)  TempSrc: Oral  Oral Oral  SpO2: 96%  94% 99%  Weight:   66.5 kg   Height:        Intake/Output Summary (Last 24 hours) at 05/27/2019 1625 Last data filed at 05/27/2019 0945 Gross per 24 hour  Intake 480 ml  Output -  Net 480 ml     Wt Readings from Last 3 Encounters:  05/27/19 66.5 kg  03/25/19 65.8 kg  12/08/18 68.5 kg     Exam  General: Alert and oriented x 3, frail, ill-appearing  Eyes:   HEENT:  Atraumatic, normocephalic  Cardiovascular: S1 S2 auscultated, Regular rate and rhythm.  Respiratory: Decreased breath sound at the bases  Gastrointestinal: Soft,  nontender, nondistended, + bowel sounds  Ext: no pedal edema bilaterally  Neuro no new FND's  Musculoskeletal: No digital cyanosis, clubbing  Skin: No rashes  Psych: Alert and oriented   Data Reviewed:  I have personally reviewed following labs and imaging studies  Micro Results Recent Results (from the past 240 hour(s))  SARS Coronavirus 2 (CEPHEID - Performed in [facility] hospital lab), Hosp Order     Status: None   Collection Time: 05/19/19 11:42 AM   Specimen: Nasopharyngeal Swab  Result Value Ref Range Status   SARS Coronavirus 2 NEGATIVE NEGATIVE Final    Comment: (NOTE)  If result is NEGATIVE SARS-CoV-2 target nucleic acids are NOT DETECTED. The SARS-CoV-2 RNA is generally detectable in upper and lower  respiratory specimens during the acute phase of infection. The lowest  concentration of SARS-CoV-2 viral copies this assay can detect is 250  copies / mL. A negative result does not preclude SARS-CoV-2 infection  and should not be used as the sole basis for treatment or other  patient management decisions.  A negative result may occur with  improper specimen collection / handling, submission of specimen other  than nasopharyngeal swab, presence of viral mutation(s) within the  areas targeted by this assay, and inadequate number of viral copies  (<250 copies / mL). A negative result must be combined with clinical  observations, patient history, and epidemiological information. If result is POSITIVE SARS-CoV-2 target nucleic acids are DETECTED. The SARS-CoV-2 RNA is generally detectable in upper and lower  respiratory specimens dur ing the acute phase of infection.  Positive  results are indicative of active infection with SARS-CoV-2.  Clinical  correlation with patient history and other diagnostic information is  necessary to determine patient infection status.  Positive results do  not rule out bacterial infection or co-infection with other viruses. If result is  PRESUMPTIVE POSTIVE SARS-CoV-2 nucleic acids MAY BE PRESENT.   A presumptive positive result was obtained on the submitted specimen  and confirmed on repeat testing.  While 2019 novel coronavirus  (SARS-CoV-2) nucleic acids may be present in the submitted sample  additional confirmatory testing may be necessary for epidemiological  and / or clinical management purposes  to differentiate between  SARS-CoV-2 and other Sarbecovirus currently known to infect humans.  If clinically indicated additional testing with an alternate test  methodology [phone]) is advised. The SARS-CoV-2 RNA is generally  detectable in upper and lower respiratory sp ecimens during the acute  phase of infection. The expected result is Negative. Fact Sheet for Patients:  StrictlyIdeas.no Fact Sheet for Healthcare Providers: BankingDealers.co.za This test is not yet approved or cleared by the [place] FDA and has been authorized for detection and/or diagnosis of SARS-CoV-2 by FDA under an Emergency Use Authorization (EUA).  This EUA will remain in effect (meaning this test can be used) for the duration of the COVID-19 declaration under Section 564(b)(1) of the Act, 21 U.S.C. section 360bbb-3(b)(1), unless the authorization is terminated or revoked sooner. Performed at [facility], [place] [address] [place], [place] [address]   Culture, Urine     Status: None   Collection Time: 05/19/19  6:11 PM   Specimen: Urine, Random  Result Value Ref Range Status   Specimen Description   Final    URINE, RANDOM Performed at [place] [address] [place], [place] [address]    Special Requests   Final    NONE Performed at [facility], [place] [address] [place], [place] [address]    Culture   Final    NO GROWTH Performed at [facility], [place] [address], [place], [place] [address]    Report Status  05/22/2019 FINAL  Final  Culture, blood (routine x 2)     Status: None   Collection Time: 05/19/19  7:01 PM   Specimen: BLOOD  Result Value Ref Range Status   Specimen Description   Final    BLOOD LEFT ANTECUBITAL Performed at [place] [address], [place], [place] [address]    Special Requests   Final  BOTTLES DRAWN AEROBIC ONLY Blood Culture adequate volume Performed at [facility], [address] [place], [place] [address]    Culture   Final    NO GROWTH 5 DAYS Performed at [facility], [address] [place], [place] [address]    Report Status 05/25/2019 FINAL  Final  Culture, blood (routine x 2)     Status: None   Collection Time: 05/19/19  7:01 PM   Specimen: BLOOD LEFT HAND  Result Value Ref Range Status   Specimen Description   Final    BLOOD LEFT HAND Performed at [facility], [address] [place], [place] [address]    Special Requests   Final    BOTTLES DRAWN AEROBIC ONLY Blood Culture adequate volume Performed at [facility], [address] [place], [place] [address]    Culture   Final    NO GROWTH 5 DAYS Performed at [facility], [address] [place], [place] [address]    Report Status 05/25/2019 FINAL  Final  Respiratory Panel by PCR     Status: None   Collection Time: 05/20/19  2:12 PM   Specimen: Nasopharyngeal Swab; Respiratory  Result Value Ref Range Status   Adenovirus NOT DETECTED NOT DETECTED Final   Coronavirus 229E NOT DETECTED NOT DETECTED Final    Comment: (NOTE) The Coronavirus on the Respiratory Panel, DOES NOT test for the novel  Coronavirus (2019 nCoV)    Coronavirus HKU1 NOT DETECTED NOT DETECTED Final   Coronavirus NL63 NOT DETECTED NOT DETECTED Final   Coronavirus OC43 NOT DETECTED NOT DETECTED Final   Metapneumovirus NOT DETECTED NOT DETECTED Final   Rhinovirus / Enterovirus NOT DETECTED NOT DETECTED Final   Influenza A NOT  DETECTED NOT DETECTED Final   Influenza B NOT DETECTED NOT DETECTED Final   Parainfluenza Virus 1 NOT DETECTED NOT DETECTED Final   Parainfluenza Virus 2 NOT DETECTED NOT DETECTED Final   Parainfluenza Virus 3 NOT DETECTED NOT DETECTED Final   Parainfluenza Virus 4 NOT DETECTED NOT DETECTED Final   Respiratory Syncytial Virus NOT DETECTED NOT DETECTED Final   Bordetella pertussis NOT DETECTED NOT DETECTED Final   Chlamydophila pneumoniae NOT DETECTED NOT DETECTED Final   Mycoplasma pneumoniae NOT DETECTED NOT DETECTED Final    Comment: Performed at [facility], [address] [place], [place] [address]  MRSA PCR Screening     Status: None   Collection Time: 05/20/19  2:12 PM   Specimen: Nasal Mucosa; Nasopharyngeal  Result Value Ref Range Status   MRSA by PCR NEGATIVE NEGATIVE Final    Comment:        The GeneXpert MRSA Assay (FDA approved for NASAL specimens only), is one component of a comprehensive MRSA colonization surveillance program. It is not intended to diagnose MRSA infection nor to guide or monitor treatment for MRSA infections. Performed at [facility], [address] [place], [place] [address]   Novel Coronavirus, NAA (hospital order; send-out to ref lab)     Status: Abnormal   Collection Time: 05/24/19  1:28 PM   Specimen: Nasopharyngeal Swab; Respiratory  Result Value Ref Range Status   SARS-CoV-2, NAA DETECTED (A) NOT DETECTED Final    Comment: RESULT CALLED TO, READ BACK BY AND VERIFIED WITH: MARRISA LONG @1507  05/26/2019 C VARNER (NOTE)                  Client Requested Flag This test was developed  and its performance characteristics determined by [employer]. This test has not been FDA cleared or approved. This test has been authorized by FDA under an Emergency Use Authorization (EUA). This test is only authorized for the duration of time the declaration that circumstances exist justifying the authorization of the  emergency use of in vitro diagnostic tests for detection of SARS-CoV-2 virus and/or diagnosis of COVID-19 infection under section 564(b)(1) of the Act, 21 U.S.C. 161WRU-0(A)(5), unless the authorization is terminated or revoked sooner. When diagnostic testing is negative, the possibility of a false negative result should be considered in the context of a patient's recent exposures and the presence of clinical signs and symptoms consistent with COVID-19. An individual without symptoms of C OVID-19 and who is not shedding SARS-CoV-2 virus would expect to have a negative (not detected) result in this assay. Performed At: [facility] [address] [place], [place] [address] Jolene Schimke MD NW:[id number]    Coronavirus Source NASOPHARYNGEAL  Final    Comment: Performed at [facility], [address], [place], [place] [address]    Radiology Reports Dg Chest 2 View  Result Date: 05/20/2019 CLINICAL DATA:  Nausea, vomiting, pneumonia. EXAM: CHEST - 2 VIEW COMPARISON:  Radiograph May 20, 2019. FINDINGS: Stable cardiomegaly. Left-sided pacemaker is unchanged in position. No pneumothorax or pleural effusion is noted. Left lung is clear. Stable right lung opacity is noted consistent with pneumonia. Bony thorax is unremarkable. IMPRESSION: Stable right-sided pneumonia. Electronically Signed   By: Lupita Raider M.D.   On: 05/20/2019 10:03   Ct Chest W Contrast  Result Date: 05/19/2019 CLINICAL DATA:  Fever and cough EXAM: CT CHEST WITH CONTRAST TECHNIQUE: Multidetector CT imaging of the chest was performed during intravenous contrast administration. CONTRAST:  75mL OMNIPAQUE IOHEXOL 300 MG/ML  SOLN COMPARISON:  Chest x-ray from earlier in the same day. FINDINGS: Cardiovascular: Thoracic aorta demonstrates some mild atherosclerotic calcifications without aneurysmal dilatation or dissection. Mild cardiac enlargement is seen. Pacing device is again noted. The pulmonary  artery shows no large central pulmonary embolus. Mediastinum/Nodes: Thoracic inlet is within normal limits. No significant hilar or mediastinal adenopathy is noted. The esophagus as visualized is within normal limits. Lungs/Pleura: The lungs are well aerated bilaterally but again demonstrate patchy infiltrate throughout the right upper and lower lobes similar to that seen on prior plain film examination. Involvement of the right middle lobe is noted as well. No sizable effusion is seen. Left lung shows no focal infiltrate. Upper Abdomen: Visualized upper abdomen shows evidence of prior cholecystectomy. No acute abnormality is seen. Musculoskeletal: Degenerative changes of the thoracic spine are noted. Old rib fractures are seen bilaterally. IMPRESSION: Multifocal pneumonia throughout the right upper, middle and lower lobes. These changes are similar to that seen on prior plain film examination. Aortic Atherosclerosis (ICD10-I70.0). Electronically Signed   By: Alcide Clever M.D.   On: 05/19/2019 20:04   Dg Chest Port 1 View  Result Date: 05/20/2019 CLINICAL DATA:  85 year old female with shortness of breath. Negative for COVID-19. Yesterday. EXAM: PORTABLE CHEST 1 VIEW COMPARISON:  05/19/2019 and earlier. FINDINGS: Portable AP semi upright view at 0507 hours. Confluent right lung airspace opacity seen to be multilobar on the CT yesterday. Right lung ventilation is stable. No pleural effusion. Stable cardiomegaly and mediastinal contours. Left chest cardiac pacemaker. The left lung remains spared. Partially visible surgical clips and spinal hardware in the upper abdomen. IMPRESSION: 1. Stable multilobar right lung pneumonia since yesterday. 2. No new cardiopulmonary abnormality. Electronically  Signed   By: Odessa Fleming M.D.   On: 05/20/2019 07:35   Dg Chest Port 1 View  Result Date: 05/19/2019 CLINICAL DATA:  Cough, fever, chills and shortness of breath. EXAM: PORTABLE CHEST 1 VIEW COMPARISON:  11/12/2016 FINDINGS:  The heart is mildly enlarged but stable. Right atrial and right ventricular pacer wires in good position, unchanged. Right upper and right lower lobe infiltrates are noted. The left lung is clear. No pleural effusions. IMPRESSION: Right upper and right lower lobe pneumonia. Electronically Signed   By: Rudie Meyer M.D.   On: 05/19/2019 12:40    Lab Data:  CBC: Recent Labs  Lab 05/21/19 0501 05/23/19 0557 05/26/19 0545  WBC 9.2 6.2 6.1  NEUTROABS 7.2  --   --   HGB 9.9* 10.2* 11.4*  HCT 33.0* 32.7* 37.0  MCV 105.4* 103.5* 103.6*  PLT 138* 187 254   Basic Metabolic Panel: Recent Labs  Lab 05/21/19 0501 05/22/19 0434 05/23/19 0557 05/25/19 0529  NA 137 134* 135 136  K 3.7 4.0 3.7 3.9  CL 101 99 100 100  CO2 GLUCOSE 108* 105* 100* 108*  BUN CREATININE 0.54 0.52 0.44 0.59  CALCIUM 8.2* 8.2* 8.3* 8.6*  MG 2.0  --   --  2.3   GFR: Estimated Creatinine Clearance: 38.7 mL/min (by C-G formula based on SCr of 0.59 mg/dL). Liver Function Tests: No results for input(s): AST, ALT, ALKPHOS, BILITOT, PROT, ALBUMIN in the last 168 hours. No results for input(s): LIPASE, AMYLASE in the last 168 hours. No results for input(s): AMMONIA in the last 168 hours. Coagulation Profile: Recent Labs  Lab 05/23/19 0557 05/24/19 0529 05/25/19 0529 05/26/19 0545 05/27/19 0509  INR 1.5* 1.6* 1.7* 1.7* 1.6*   Cardiac Enzymes: No results for input(s): CKTOTAL, CKMB, CKMBINDEX, TROPONINI in the last 168 hours. BNP (last 3 results) No results for input(s): PROBNP in the last 8760 hours. HbA1C: No results for input(s): HGBA1C in the last 72 hours. CBG: Recent Labs  Lab 05/23/19 0806 05/24/19 0746 05/25/19 0718 05/26/19 0755 05/27/19 0845  GLUCAP 106* 101* 106* 88 100*   Lipid Profile: No results for input(s): CHOL, HDL, LDLCALC, TRIG, CHOLHDL, LDLDIRECT in the last 72 hours. Thyroid Function Tests: No results for input(s): TSH, T4TOTAL, FREET4, T3FREE,  THYROIDAB in the last 72 hours. Anemia Panel: No results for input(s): VITAMINB12, FOLATE, FERRITIN, TIBC, IRON, RETICCTPCT in the last 72 hours. Urine analysis:    Component Value Date/Time   COLORURINE YELLOW 05/19/2019 1142   APPEARANCEUR CLEAR 05/19/2019 1142   LABSPEC 1.035 (H) 05/19/2019 1142   PHURINE 5.0 05/19/2019 1142   GLUCOSEU NEGATIVE 05/19/2019 1142   HGBUR NEGATIVE 05/19/2019 1142   BILIRUBINUR NEGATIVE 05/19/2019 1142   KETONESUR NEGATIVE 05/19/2019 1142   PROTEINUR NEGATIVE 05/19/2019 1142   NITRITE NEGATIVE 05/19/2019 1142   LEUKOCYTESUR NEGATIVE 05/19/2019 1142     Ripudeep Rai M.D. [facility] 05/27/2019, 4:25 PM  Pager: [phone] Between 7am to 7pm - call Pager - [phone]  After 7pm go to [email or web address] - password [email or web address]  Call night coverage person covering after 7pm

## 2019-05-27 NOTE — Progress Notes (Signed)
ANTICOAGULATION CONSULT NOTE  Pharmacy Consult for warfarin Indication: A-fib  No Known Allergies  Patient Measurements: Height: 5\' 5"  (165.1 cm) Weight: 146 lb 9.7 oz (66.5 kg) IBW/kg (Calculated) : 57  Vital Signs: Temp: 97.7 F (36.5 C) (07/15 0635) Temp Source: Oral (07/15 0635) BP: 151/75 (07/15 0635) Pulse Rate: 70 (07/15 0635)  Labs: Recent Labs    05/25/19 0529 05/26/19 0545 05/27/19 0509  HGB  --  11.4*  --   HCT  --  37.0  --   PLT  --  254  --   LABPROT 19.9* 19.7* 19.2*  INR 1.7* 1.7* 1.6*  CREATININE 0.59  --   --    Estimated Creatinine Clearance: 38.7 mL/min (by C-G formula based on SCr of 0.59 mg/dL).  Medical History: Past Medical History:  Diagnosis Date  . Arthritis   . Atrial fibrillation ([name])   . COPD (chronic obstructive pulmonary disease) ([place])   . Hip pain, left   . Hypertension   . Sinoatrial node dysfunction (HCC)     Assessment: 52 yoF with PMH Afib on warfarin PTA, SA node dysfunction s/p pacemaker, COPD, admitted 7/7 for CAP. Pharmacy to continue warfarin while admitted.    Baseline INR elevated (consistent with acute infection)  Prior anticoagulation: warfarin 6 mg daily, last dose 7/6  Warfarin resumed 7/9 once INR returned to therapeutic range  Today, 05/27/2019:  INR remains below therapeutic range and decreased today to 1.6  CBC: Hgb improved to 11.4, Pltc WNL (7/14)  Major drug interactions: broad spectrum abx discontinued after 5 day course of treatment   Heart healthy diet, 25% of breakfast charted this AM  Goal of Therapy: INR 2-3  Plan:  Warfarin 10mg  PO x 1 today   Daily INR  CBC at least q72h while on warfarin   Monitor for s/sx of bleeding    Lindell Spar, PharmD, BCPS Pager: [phone] 05/27/2019, 10:20 AM

## 2019-05-27 NOTE — Progress Notes (Addendum)
Pt transferred to [place] today per Dr. Tana Coast. Pt's IV site WDL. Pt's VSS. Report called to [facility] and receiving nurse at [facility]. Verbalized understanding. Pt left floor via stretcher accompanied by [facility] staff in stable condition. RN called step-son, [name], with update on plan of care. Verbalized understanding. Clothes will be bagged for family to pick up at their convenience.

## 2019-05-28 LAB — PROTIME-INR
INR: 1.7 — ABNORMAL HIGH (ref 0.8–1.2)
Prothrombin Time: 19.7 seconds — ABNORMAL HIGH (ref 11.4–15.2)

## 2019-05-28 LAB — GLUCOSE, CAPILLARY
Glucose-Capillary: 104 mg/dL — ABNORMAL HIGH (ref 70–99)
Glucose-Capillary: 114 mg/dL — ABNORMAL HIGH (ref 70–99)

## 2019-05-28 MED ORDER — CYANOCOBALAMIN 1000 MCG/ML IJ SOLN
1000.0000 ug | Freq: Every day | INTRAMUSCULAR | Status: AC
  Administered 2019-05-28 – 2019-05-30 (×3): 1000 ug via SUBCUTANEOUS
  Filled 2019-05-28 (×3): qty 1

## 2019-05-28 MED ORDER — ALUM & MAG HYDROXIDE-SIMETH 200-200-20 MG/5ML PO SUSP: 30.0000 mL | Freq: Four times a day (QID) | ORAL | Status: DC | PRN

## 2019-05-28 MED ORDER — WARFARIN SODIUM 10 MG PO TABS
10.0000 mg | ORAL_TABLET | Freq: Once | ORAL | Status: AC
  Administered 2019-05-28: 10 mg via ORAL
  Filled 2019-05-28: qty 1

## 2019-05-28 MED ORDER — VITAMIN B-12 1000 MCG PO TABS
1000.0000 ug | ORAL_TABLET | Freq: Every day | ORAL | Status: DC
  Administered 2019-05-31 – 2019-06-02 (×3): 1000 ug via ORAL
  Filled 2019-05-28 (×3): qty 1

## 2019-05-28 MED ORDER — SIMETHICONE 80 MG PO CHEW
80.0000 mg | CHEWABLE_TABLET | Freq: Four times a day (QID) | ORAL | Status: DC | PRN
  Administered 2019-05-28 – 2019-05-30 (×2): 80 mg via ORAL
  Filled 2019-05-28 (×3): qty 1

## 2019-05-28 NOTE — Progress Notes (Signed)
ANTICOAGULATION CONSULT NOTE  Pharmacy Consult for warfarin Indication: A-fib  No Known Allergies  Patient Measurements: Height: 5\' 5"  (165.1 cm) Weight: 143 lb 15.4 oz (65.3 kg) IBW/kg (Calculated) : 57  Vital Signs: Temp: 97.7 F (36.5 C) (07/16 0853) Temp Source: Oral (07/16 0853) BP: 137/75 (07/16 0948) Pulse Rate: 62 (07/16 0948)  Labs: Recent Labs    05/26/19 0545 05/27/19 0509 05/28/19 0230  HGB 11.4*  --   --   HCT 37.0  --   --   PLT 254  --   --   LABPROT 19.7* 19.2* 19.7*  INR 1.7* 1.6* 1.7*   Estimated Creatinine Clearance: 38.7 mL/min (by C-G formula based on SCr of 0.59 mg/dL).  Medical History: Past Medical History:  Diagnosis Date  . Arthritis   . Atrial fibrillation ([place])   . COPD (chronic obstructive pulmonary disease) ([place])   . Hip pain, left   . Hypertension   . Sinoatrial node dysfunction ([facility])     Assessment: 12 yoF with PMH Afib on warfarin PTA, SA node dysfunction s/p pacemaker, COPD, admitted 7/7 for CAP. Pharmacy to continue warfarin while admitted.    Prior anticoagulation: warfarin 6 mg daily, last dose 7/6   INR still subtherapeutic today at 1.7. We will repeat with the high dose today.   Goal of Therapy: INR 2-3  Plan:  Warfarin 10mg  PO x 1 today   Daily INR  CBC at least q72h while on warfarin   Monitor for s/sx of bleeding    Onnie Boer, PharmD, BCIDP, AAHIVP, CPP Infectious Disease Pharmacist 05/28/2019 11:53 AM

## 2019-05-28 NOTE — Progress Notes (Addendum)
[place] TEAM 1 - Stepdown/ICU TEAM  Fenton Foyempie H Shockley  XBJ:[id number] DOB: 08-Dec-1922 DOA: 05/19/2019 PCP: [name], Edward, MD    Brief Narrative:  85 year old with a history of arthritis, hip fracture, endometrial carcinoma, sinoatrial node dysfunction, pacemaker, atrial fibrillation on Coumadin, COPD, and HTN who presented from home with nausea and vomiting, chills and shortness of breath which began the day of her admit.    Initial COVID-19 test in ED was negative.  Respiratory virus panel negative. Patient's respiratory status continued to worsen with hypoxia.  Repeat COVID test done for SNF placement was positive.  Significant Events: [facility]12 SARS-CoV-2 test for SNF placement positive  COVID-19 specific Treatment: None  Subjective: The patient is sitting up in bed.  She denies chest pain nausea vomiting or abdominal pain.  She is alert oriented and very pleasant.  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  Assessment & Plan:  Multilobar COVID pneumonia -acute hypoxic respiratory failure Right-sided multilobar pneumonia confirmed via CT chest -repeat SARS-CoV-2 test 7/12 positive - slowly improving clinically  COPD Does not require chronic O2  HTN Blood pressure currently controlled  Atrial fibrillation with SSS status post pacemaker Continue beta-blocker -Coumadin dosing per pharmacy  Chronic diastolic CHF On chronic as needed Lasix -no significant volume overload at this time  Macrocytic anemia - B12 deficiency Likely pernicious anemia related to advanced age -add subcutaneous replacement x3 days to assure stores replenished - oral supplementation may not be sufficient -will need to be followed up as outpatient  Deconditioning Awaiting SNF placement  DVT prophylaxis: Warfarin Code Status: Patient was alert and oriented x3 when she confirmed to me that she would not wish to be intubated or put through CPR -she made it clear she desires  NO CODE BLUE status Family Communication:  Disposition Plan: Awaiting SNF placement -SNF requiring negative COVID test prior to acceptance  Consultants:  none  Antimicrobials:  None  Objective: Blood pressure (!) 142/86, pulse (!) 54, temperature 97.7 F (36.5 C), temperature source Oral, resp. rate 15, height 5\' 5"  (1.651 m), weight 65.3 kg, SpO2 96 %.  Intake/Output Summary (Last 24 hours) at 05/28/2019 0924 Last data filed at 05/28/2019 0734 Gross per 24 hour  Intake 690 ml  Output 625 ml  Net 65 ml   Filed Weights   05/26/19 0529 05/27/19 0635 05/28/19 0500  Weight: 66 kg 66.5 kg 65.3 kg    Examination: General: No acute respiratory distress Lungs: Clear to auscultation bilaterally without wheezes or crackles Cardiovascular: Regular rate and rhythm without murmur gallop or rub normal S1 and S2 Abdomen: Nontender, nondistended, soft, bowel sounds positive, no rebound, no ascites, no appreciable mass Extremities: No significant cyanosis, clubbing, or edema bilateral lower extremities  CBC: Recent Labs  Lab 05/23/19 0557 05/26/19 0545  WBC 6.2 6.1  HGB 10.2* 11.4*  HCT 32.7* 37.0  MCV 103.5* 103.6*  PLT 187 254   Basic Metabolic Panel: Recent Labs  Lab 05/22/19 0434 05/23/19 0557 05/25/19 0529  NA 134* 135 136  K 4.0 3.7 3.9  CL 99 100 100  CO2 27 27 27   GLUCOSE 105* 100* 108*  BUN 12 12 16   CREATININE 0.52 0.44 0.59  CALCIUM 8.2* 8.3* 8.6*  MG  --   --  2.3   GFR: Estimated Creatinine Clearance: 38.7 mL/min (by C-G formula based on SCr of 0.59 mg/dL).  Liver Function Tests: No results for input(s): AST, ALT, ALKPHOS, BILITOT, PROT,  ALBUMIN in the last 168 hours. No results for input(s): LIPASE, AMYLASE in the last 168 hours. No results for input(s): AMMONIA in the last 168 hours.  Coagulation Profile: Recent Labs  Lab 05/24/19 0529 05/25/19 0529 05/26/19 0545 05/27/19 0509 05/28/19 0230  INR 1.6* 1.7* 1.7* 1.6* 1.7*    CBG: Recent Labs   Lab 05/25/19 0718 05/26/19 0755 05/27/19 0845 05/28/19 0815 05/28/19 0852  GLUCAP 106* 88 100* 104* 114*    Recent Results (from the past 240 hour(s))  SARS Coronavirus 2 (CEPHEID - Performed in [facility] lab), Hosp Order     Status: None   Collection Time: 05/19/19 11:42 AM   Specimen: Nasopharyngeal Swab  Result Value Ref Range Status   SARS Coronavirus 2 NEGATIVE NEGATIVE Final    Comment: (NOTE) If result is NEGATIVE SARS-CoV-2 target nucleic acids are NOT DETECTED. The SARS-CoV-2 RNA is generally detectable in upper and lower  respiratory specimens during the acute phase of infection. The lowest  concentration of SARS-CoV-2 viral copies this assay can detect is 250  copies / mL. A negative result does not preclude SARS-CoV-2 infection  and should not be used as the sole basis for treatment or other  patient management decisions.  A negative result may occur with  improper specimen collection / handling, submission of specimen other  than nasopharyngeal swab, presence of viral mutation(s) within the  areas targeted by this assay, and inadequate number of viral copies  (<250 copies / mL). A negative result must be combined with clinical  observations, patient history, and epidemiological information. If result is POSITIVE SARS-CoV-2 target nucleic acids are DETECTED. The SARS-CoV-2 RNA is generally detectable in upper and lower  respiratory specimens dur ing the acute phase of infection.  Positive  results are indicative of active infection with SARS-CoV-2.  Clinical  correlation with patient history and other diagnostic information is  necessary to determine patient infection status.  Positive results do  not rule out bacterial infection or co-infection with other viruses. If result is PRESUMPTIVE POSTIVE SARS-CoV-2 nucleic acids MAY BE PRESENT.   A presumptive positive result was obtained on the submitted specimen  and confirmed on repeat testing.  While 2019  novel coronavirus  (SARS-CoV-2) nucleic acids may be present in the submitted sample  additional confirmatory testing may be necessary for epidemiological  and / or clinical management purposes  to differentiate between  SARS-CoV-2 and other Sarbecovirus currently known to infect humans.  If clinically indicated additional testing with an alternate test  methodology [phone]) is advised. The SARS-CoV-2 RNA is generally  detectable in upper and lower respiratory sp ecimens during the acute  phase of infection. The expected result is Negative. Fact Sheet for Patients:  BoilerBrush.com.cyhttps://www.fda.gov/media/136312/download Fact Sheet for Healthcare Providers: https://pope.com/https://www.fda.gov/media/136313/download This test is not yet approved or cleared by the [place] FDA and has been authorized for detection and/or diagnosis of SARS-CoV-2 by FDA under an Emergency Use Authorization (EUA).  This EUA will remain in effect (meaning this test can be used) for the duration of the COVID-19 declaration under Section 564(b)(1) of the Act, 21 U.S.C. section 360bbb-3(b)(1), unless the authorization is terminated or revoked sooner. Performed at [facility], [address] [place], [place] [address]   Culture, Urine     Status: None   Collection Time: 05/19/19  6:11 PM   Specimen: Urine, Random  Result Value Ref Range Status   Specimen Description   Final    URINE, RANDOM Performed at  [facility], [place] [address] [place], [place] [address]    Special Requests   Final    NONE Performed at [facility], [place] [address], [place], [place] [address]    Culture   Final    NO GROWTH Performed at [facility], [place] [address], [place], [place] [address]    Report Status 05/22/2019 FINAL  Final  Culture, blood (routine x 2)     Status: None   Collection Time: 05/19/19  7:01 PM   Specimen: BLOOD  Result Value Ref Range Status   Specimen Description    Final    BLOOD LEFT ANTECUBITAL Performed at [place] [address] [place], [place] [address]    Special Requests   Final    BOTTLES DRAWN AEROBIC ONLY Blood Culture adequate volume Performed at [place] [address] [place], [place] [address]    Culture   Final    NO GROWTH 5 DAYS Performed at [facility], [place] [address] [place], [place] [address]    Report Status 05/25/2019 FINAL  Final  Culture, blood (routine x 2)     Status: None   Collection Time: 05/19/19  7:01 PM   Specimen: BLOOD LEFT HAND  Result Value Ref Range Status   Specimen Description   Final    BLOOD LEFT HAND Performed at [place] [address] [place], [place] [address]    Special Requests   Final    BOTTLES DRAWN AEROBIC ONLY Blood Culture adequate volume Performed at [place] [address] [place], [place] [address]    Culture   Final    NO GROWTH 5 DAYS Performed at [facility], [place] [address] [place], [place] [address]    Report Status 05/25/2019 FINAL  Final  Respiratory Panel by PCR     Status: None   Collection Time: 05/20/19  2:12 PM   Specimen: Nasopharyngeal Swab; Respiratory  Result Value Ref Range Status   Adenovirus NOT DETECTED NOT DETECTED Final   Coronavirus 229E NOT DETECTED NOT DETECTED Final    Comment: (NOTE) The Coronavirus on the Respiratory Panel, DOES NOT test for the novel  Coronavirus (2019 nCoV)    Coronavirus HKU1 NOT DETECTED NOT DETECTED Final   Coronavirus NL63 NOT DETECTED NOT DETECTED Final   Coronavirus OC43 NOT DETECTED NOT DETECTED Final   Metapneumovirus NOT DETECTED NOT DETECTED Final   Rhinovirus / Enterovirus NOT DETECTED NOT DETECTED Final   Influenza A NOT DETECTED NOT DETECTED Final   Influenza B NOT DETECTED NOT DETECTED Final   Parainfluenza Virus 1 NOT DETECTED NOT DETECTED Final   Parainfluenza Virus 2 NOT DETECTED NOT DETECTED Final    Parainfluenza Virus 3 NOT DETECTED NOT DETECTED Final   Parainfluenza Virus 4 NOT DETECTED NOT DETECTED Final   Respiratory Syncytial Virus NOT DETECTED NOT DETECTED Final   Bordetella pertussis NOT DETECTED NOT DETECTED Final   Chlamydophila pneumoniae NOT DETECTED NOT DETECTED Final   Mycoplasma pneumoniae NOT DETECTED NOT DETECTED Final    Comment: Performed at [facility], [place] [address], [place], [place] [address]  MRSA PCR Screening     Status: None   Collection Time: 05/20/19  2:12 PM   Specimen: Nasal Mucosa; Nasopharyngeal  Result Value Ref Range Status   MRSA by PCR NEGATIVE NEGATIVE Final    Comment:        The GeneXpert  MRSA Assay (FDA approved for NASAL specimens only), is one component of a comprehensive MRSA colonization surveillance program. It is not intended to diagnose MRSA infection nor to guide or monitor treatment for MRSA infections. Performed at [facility], [address] [place] [address] [place], [place] [address]   Novel Coronavirus, NAA (hospital order; send-out to ref lab)     Status: Abnormal   Collection Time: 05/24/19  1:28 PM   Specimen: Nasopharyngeal Swab; Respiratory  Result Value Ref Range Status   SARS-CoV-2, NAA DETECTED (A) NOT DETECTED Final    Comment: RESULT CALLED TO, READ BACK BY AND VERIFIED WITH: MARRISA LONG @1507  05/26/2019 C VARNER (NOTE)                  Client Requested Flag This test was developed and its performance characteristics determined by [facility]. This test has not been FDA cleared or approved. This test has been authorized by FDA under an Emergency Use Authorization (EUA). This test is only authorized for the duration of time the declaration that circumstances exist justifying the authorization of the emergency use of in vitro diagnostic tests for detection of SARS-CoV-2 virus and/or diagnosis of COVID-19 infection under section 564(b)(1) of the Act, 21 U.S.C. 604VWU-9(W)(1360bbb-3(b)(1), unless the  authorization is terminated or revoked sooner. When diagnostic testing is negative, the possibility of a false negative result should be considered in the context of a patient's recent exposures and the presence of clinical signs and symptoms consistent with COVID-19. An individual without symptoms of C OVID-19 and who is not shedding SARS-CoV-2 virus would expect to have a negative (not detected) result in this assay. Performed At: [facility] [address] [place], [place] [id number] Jolene SchimkeNagendra Sanjai MD AO:[id number]:[phone]    Coronavirus Source NASOPHARYNGEAL  Final    Comment: Performed at [facility], [address] [place], [place] [address]     Scheduled Meds: . guaiFENesin  600 mg Oral BID  . ipratropium  2 puff Inhalation BID  . levalbuterol  1 puff Inhalation BID  . lisinopril  40 mg Oral Daily  . metoprolol tartrate  25 mg Oral BID  . multivitamin with minerals  1 tablet Oral Daily  . senna-docusate  1 tablet Oral BID  . vitamin B-12  1,000 mcg Oral Daily  . Warfarin - Pharmacist Dosing Inpatient   Does not apply q1800     LOS: 9 days   Lonia BloodJeffrey T. , MD [facility] Office  [phone] Pager - Text Page per Amion  If 7PM-7AM, please contact night-coverage per Amion 05/28/2019, 9:24 AM

## 2019-05-28 NOTE — Progress Notes (Signed)
Physical Therapy Treatment Patient Details Name: [name] MRN: [id number] DOB: [birth year] Today's Date: 05/28/2019    History of Present Illness 85 year old female admitted 05/19/19 for nausea, vomiting and chills. Tested negative for corona virus; Dx'd with pna. 05/24/19 tested + COVID (re-tested for discharge planning) PMH:  arthritis, endometrial CA, SA node dsyfunction, pacemaker, COPD, and HTN    PT Comments    Patient with sats 96% at rest on room air. Became dyspneic with ambulation, however sats remained 98%. She lives alone and continue to recommend SNF for further rehab prior to home.    Follow Up Recommendations  Supervision/Assistance - 24 hour;SNF     Equipment Recommendations  None recommended by PT    Recommendations for Other Services       Precautions / Restrictions Precautions Precautions: Fall Precaution Comments: monitor O2 Restrictions Weight Bearing Restrictions: No    Mobility  Bed Mobility                  Transfers Overall transfer level: Needs assistance Equipment used: Rolling walker (2 wheeled) Transfers: Sit to/from Stand Sit to Stand: Min guard         General transfer comment: incr time and effort; states she uses lift chair at home  Ambulation/Gait Ambulation/Gait assistance: Min guard Gait Distance (Feet): 35 Feet Assistive device: Rolling walker (2 wheeled) Gait Pattern/deviations: Step-through pattern;Decreased step length - right;Decreased step length - left;Shuffle;Trunk flexed Gait velocity: decreased   General Gait Details: pt with dyspnea with ambulation to her door and upon return to her recliner she reported she needed to rest and did not want to walk a second time   Stairs             Wheelchair Mobility    Modified Rankin (Stroke Patients Only)       Balance Overall balance assessment: Needs assistance Sitting-balance support: No upper extremity supported;Feet supported Sitting balance-Leahy  Scale: Good     Standing balance support: Bilateral upper extremity supported Standing balance-Leahy Scale: Fair                              Cognition Arousal/Alertness: Awake/alert Behavior During Therapy: WFL for tasks assessed/performed Overall Cognitive Status: Within Functional Limits for tasks assessed                                        Exercises General Exercises - Lower Extremity Long Arc Quad: Strengthening;Both;10 reps;Seated Hip Flexion/Marching: Strengthening;Both;10 reps;Standing(bil UE support on RW)    General Comments        Pertinent Vitals/Pain Pain Assessment: No/denies pain    Home Living Family/patient expects to be discharged to:: Private residence Living Arrangements: Alone Available Help at Discharge: Family;Available PRN/intermittently Type of Home: House Home Access: Stairs to enter Entrance Stairs-Rails: Right Home Layout: One level Home Equipment: Shower seat;Bedside commode;Walker - 2 wheels;Cane - quad      Prior Function Level of Independence: Independent with assistive device(s)      Comments: step children assist with outside work, shopping   PT Goals (current goals can now be found in the care plan section) Acute Rehab PT Goals Patient Stated Goal: none stated; agreeable to therapy Time For Goal Achievement: 06/03/19 Potential to Achieve Goals: Fair Progress towards PT goals: Progressing toward goals    Frequency    Min  2X/week      PT Plan Current plan remains appropriate    Co-evaluation              AM-PAC PT "6 Clicks" Mobility   Outcome Measure  Help needed turning from your back to your side while in a flat bed without using bedrails?: None Help needed moving from lying on your back to sitting on the side of a flat bed without using bedrails?: A Little Help needed moving to and from a bed to a chair (including a wheelchair)?: A Little Help needed standing up from a chair  using your arms (e.g., wheelchair or bedside chair)?: A Little Help needed to walk in hospital room?: A Little Help needed climbing 3-5 steps with a railing? : A Lot 6 Click Score: 18    End of Session Equipment Utilized During Treatment: Gait belt Activity Tolerance: Patient limited by fatigue Patient left: in chair;with call bell/phone within reach   PT Visit Diagnosis: Difficulty in walking, not elsewhere classified (R26.2);Muscle weakness (generalized) (M62.81)     Time: [id number] PT Time Calculation (min) (ACUTE ONLY): 31 min  Charges:  $Gait Training: 8-22 mins $Therapeutic Exercise: 8-22 mins                        [employer]ynn P Cailee Blanke, PT 05/28/2019, 4:52 PM

## 2019-05-29 LAB — CBC
HCT: 40 % (ref 36.0–46.0)
Hemoglobin: 12.1 g/dL (ref 12.0–15.0)
MCH: 31.7 pg (ref 26.0–34.0)
MCHC: 30.3 g/dL (ref 30.0–36.0)
MCV: 104.7 fL — ABNORMAL HIGH (ref 80.0–100.0)
Platelets: 361 10*3/uL (ref 150–400)
RBC: 3.82 MIL/uL — ABNORMAL LOW (ref 3.87–5.11)
RDW: 16.2 % — ABNORMAL HIGH (ref 11.5–15.5)
WBC: 8.1 10*3/uL (ref 4.0–10.5)
nRBC: 0 % (ref 0.0–0.2)

## 2019-05-29 LAB — PROTIME-INR
INR: 1.7 — ABNORMAL HIGH (ref 0.8–1.2)
Prothrombin Time: 19.5 seconds — ABNORMAL HIGH (ref 11.4–15.2)

## 2019-05-29 LAB — GLUCOSE, CAPILLARY: Glucose-Capillary: 102 mg/dL — ABNORMAL HIGH (ref 70–99)

## 2019-05-29 MED ORDER — GUAIFENESIN ER 600 MG PO TB12: 600.0000 mg | ORAL_TABLET | Freq: Two times a day (BID) | ORAL | Status: DC | PRN

## 2019-05-29 MED ORDER — LEVALBUTEROL TARTRATE 45 MCG/ACT IN AERO
1.0000 | INHALATION_SPRAY | Freq: Four times a day (QID) | RESPIRATORY_TRACT | Status: DC | PRN
  Filled 2019-05-29: qty 15

## 2019-05-29 MED ORDER — WARFARIN SODIUM 2.5 MG PO TABS
12.5000 mg | ORAL_TABLET | Freq: Once | ORAL | Status: AC
  Administered 2019-05-29: 17:00:00 12.5 mg via ORAL
  Filled 2019-05-29: qty 1

## 2019-05-29 NOTE — TOC Initial Note (Signed)
Transition of Care [facility](TOC) - Initial/Assessment Note    Patient Details  Name: [name] MRN: [id number] Date of Birth: May 07, 1923  Transition of Care [facility](TOC) CM/SW Contact:    Gwenlyn Fudgeaitlin B Latorria Zeoli, LCSWA Phone Number: 05/29/2019, 2:05 PM  Clinical Narrative:                  CSW called and spoke with the patient over the phone. CSW introduced herself and explained her role. CSW shared the therapy recommendation. CSW explained that the patient would no longer be able to [facility]. The patient stated that she wanted to return home and would rather not go to SNF. CSW asked if she had any support at home, the patient stated that she didn't. CSW discussed that it would not be safe to discharge home with no assistance. The patient became agreeable after discussing with CSW. The patient chose to go [facility] due to still having COVID-19. The patient stated she only wanted to stay for a few days. CSW stated that once the patient tested negative, the facility would be able to assist her with making other plans about returning home.   CSW will continue to follow.   Expected Discharge Plan: Skilled Nursing Facility Barriers to Discharge: Continued Medical Work up   Patient Goals and CMS Choice Patient states their goals for this hospitalization and ongoing recovery are:: Pt is agreeable to going to [facility] CMS Medicare.gov Compare Post Acute Care list provided to:: Patient Choice offered to / list presented to : Patient  Expected Discharge Plan and Services Expected Discharge Plan: Skilled Nursing Facility In-house Referral: Clinical Social Work Discharge Planning Services: NA Post Acute Care Choice: Skilled Nursing Facility Living arrangements for the past 2 months: Single Family Home Expected Discharge Date: 05/22/19               DME Arranged: N/A DME Agency: NA       HH Arranged: NA HH Agency: NA Date HH Agency Contacted: 05/21/19 Time HH Agency  Contacted: 1117 [facility] Kandee Keen[facility]  Prior Living Arrangements/Services Living arrangements for the past 2 months: Single Family Home Lives with:: Self Patient language and need for interpreter reviewed:: No Do you feel safe going back to the place where you live?: Yes      Need for Family Participation in Patient Care: Yes (Comment) Care giver support system in place?: Yes (comment)   Criminal Activity/Legal Involvement Pertinent to Current Situation/Hospitalization: No - Comment as needed  Activities of Daily Living Home Assistive Devices/Equipment: Bedside commode/3-in-1, Eyeglasses, Shower chair without back, Environmental consultantWalker (specify type), Other (Comment)(front wheeled walker, walk-in shower) ADL Screening (condition at time of admission) Patient's cognitive ability adequate to safely complete daily activities?: Yes Is the patient deaf or have difficulty hearing?: Yes Does the patient have difficulty seeing, even when wearing glasses/contacts?: No Does the patient have difficulty concentrating, remembering, or making decisions?: No Patient able to express need for assistance with ADLs?: Yes Does the patient have difficulty dressing or bathing?: Yes Independently performs ADLs?: No Communication: Independent Dressing (OT): Needs assistance Is this a change from baseline?: Change from baseline, expected to last >3 days Grooming: Needs assistance Is this a change from baseline?: Change from baseline, expected to last >3 days Feeding: Needs assistance Is this a change from baseline?: Change from baseline, expected to last >3 days Bathing: Needs assistance Is this a change from baseline?: Change from baseline, expected to last >  3 days Toileting: Dependent Is this a change from baseline?: Change from baseline, expected to last >3days In/Out Bed: Dependent Is this a change from baseline?: Change from baseline, expected to last >3 days Walks in Home: Dependent Is  this a change from baseline?: Change from baseline, expected to last >3 days Does the patient have difficulty walking or climbing stairs?: Yes(secondary to weakness) Weakness of Legs: Both Weakness of Arms/Hands: None  Permission Sought/Granted Permission sought to share information with : Case Manager Permission granted to share information with : Yes, Verbal Permission Granted  Share Information with NAME: Sarajean, [place], [place], Step-daughter, [phone]  Permission granted to share info w AGENCY: [place] granted to share info w Relationship: Step-Children     Emotional Assessment Appearance:: Appears stated age Attitude/Demeanor/Rapport: Engaged Affect (typically observed): Calm Orientation: : Oriented to Self, Oriented to Place, Oriented to  Time, Oriented to Situation Alcohol / Substance Use: Not Applicable Psych Involvement: No (comment)  Admission diagnosis:  Pneumonia of right lung due to infectious organism, unspecified part of lung [J18.9] Pneumonia [J18.9] Patient Active Problem List   Diagnosis Date Noted  . Pneumonia due to COVID-19 virus 05/26/2019  . SOB (shortness of breath)   . FTT (failure to thrive) in adult   . Lobar pneumonia ([place]) 05/19/2019  . Acute respiratory failure with hypoxia ([place]) 05/19/2019  . Nausea and vomiting 05/19/2019  . Hematoma of scalp   . Wheezing   . Acute blood loss anemia 11/13/2016  . Syncope and collapse 11/13/2016  . Fall 11/12/2016  . Annual physical exam 12/20/2014  . Hip fracture ([place]) 05/25/2014  . Encounter for therapeutic drug monitoring 01/20/2014  . H/O bilateral salpingo-oophorectomy 12/11/2013  . 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 Sinoatrial node dysfunction (HCC)   . Encounter for long-term (current) use of anticoagulants 04/30/2011  . PACEMAKER, PERMANENT 04/13/2009  . Essential hypertension 04/12/2009  . Atrial fibrillation ([place]) 04/12/2009  . COPD (chronic  obstructive pulmonary disease) ([place]) 04/12/2009  . Arthropathy 04/12/2009   PCP:  Sinda Du, MD Pharmacy:   [id number] [place], [address] [place] [address] [address] Phone: [phone] Fax: [place], [place] [facility] [address] [place] [address] [place] [address] Phone: [phone] Fax: [phone]     Social Determinants of Health (SDOH) Interventions    Readmission Risk Interventions No flowsheet data found.

## 2019-05-29 NOTE — Progress Notes (Signed)
Phone calls to both people on contact list were called and no answers. VM left for them to call back

## 2019-05-29 NOTE — Progress Notes (Signed)
[place] TEAM 1 - Stepdown/ICU TEAM  Fallen Crisostomo Steines  PZW:[id number] DOB: 1923/04/13 DOA: 05/19/2019 PCP: Sinda Du, MD    Brief Narrative:  85 year old with a history of arthritis, hip fracture, endometrial carcinoma, sinoatrial node dysfunction, pacemaker, atrial fibrillation on Coumadin, COPD, and HTN who presented from home with nausea, vomiting, chills, and shortness of breath which began the day of her admit.    Initial COVID-19 test in ED was negative.  Respiratory virus panel negative. Patient's respiratory status continued to worsen with hypoxia.  The pt improved markedly and was being prepared for d/c to a SNF when a repeat COVID test done for SNF placement was incidentally found to be positive. She was therefore refused by the SNF.  Significant Events: 7/7 admit to [facility] 7/12 SARS-CoV-2 test for SNF placement positive  COVID-19 specific Treatment: None  Subjective: The patient is sitting up in a bedside chair.  She reports no complaints today.  She is alert oriented and very pleasant.  She tells me she is very much interested in going home.  I have explained to her that at present I am concerned that she is too weak to live alone.  Assessment & Plan:  Multilobar COVID pneumonia - acute hypoxic respiratory failure Right-sided multilobar pneumonia confirmed via CT chest - repeat SARS-CoV-2 test 7/12 positive - saturating 94-97% on room air - awaiting negative test to allow for SNF placement - unsure when we could expect a negative test - consider retesting in next day or two   COPD Does not require chronic O2 -well compensated at present  HTN Blood pressure remains controlled  Atrial fibrillation with SSS status post pacemaker Continue beta-blocker - Coumadin dosing per pharmacy -rate controlled  Chronic diastolic CHF On chronic as needed Lasix - no significant volume overload presently  Macrocytic anemia - B12 deficiency Likely pernicious anemia related to advanced  age - added subcutaneous replacement x3 days to assure stores replenished - oral supplementation may not be sufficient - will need to be followed up as outpatient  Deconditioning Awaiting SNF placement  DVT prophylaxis: Warfarin Code Status: DNR - NO CODE  Family Communication:  Disposition Plan: Awaiting SNF placement -SNF requiring negative COVID test prior to acceptance  Consultants:  none  Antimicrobials:  None  Objective: Blood pressure 116/64, pulse 61, temperature (!) 97.3 F (36.3 C), temperature source Oral, resp. rate 16, height 5\' 5"  (1.651 m), weight 65.3 kg, SpO2 94 %.  Intake/Output Summary (Last 24 hours) at 05/29/2019 0829 Last data filed at 05/28/2019 2100 Gross per 24 hour  Intake 720 ml  Output --  Net 720 ml   Filed Weights   05/26/19 0529 05/27/19 0635 05/28/19 0500  Weight: 66 kg 66.5 kg 65.3 kg    Examination: General: No acute respiratory distress Lungs: CTA B - no wheezing  Cardiovascular: Regular rate - no M  Abdomen: NT/ND, soft, bs+, no mass  Extremities: No signif edema B LE   CBC: Recent Labs  Lab 05/23/19 0557 05/26/19 0545 05/29/19 0230  WBC 6.2 6.1 8.1  HGB 10.2* 11.4* 12.1  HCT 32.7* 37.0 40.0  MCV 103.5* 103.6* 104.7*  PLT 187 254 423   Basic Metabolic Panel: Recent Labs  Lab 05/23/19 0557 05/25/19 0529  NA 135 136  K 3.7 3.9  CL 100 100  CO2 27 27  GLUCOSE 100* 108*  BUN 12 16  CREATININE 0.44 0.59  CALCIUM 8.3* 8.6*  MG  --  2.3   GFR: Estimated Creatinine  Clearance: 38.7 mL/min (by C-G formula based on SCr of 0.59 mg/dL).  Liver Function Tests: No results for input(s): AST, ALT, ALKPHOS, BILITOT, PROT, ALBUMIN in the last 168 hours. No results for input(s): LIPASE, AMYLASE in the last 168 hours. No results for input(s): AMMONIA in the last 168 hours.  Coagulation Profile: Recent Labs  Lab 05/25/19 0529 05/26/19 0545 05/27/19 0509 05/28/19 0230 05/29/19 0230  INR 1.7* 1.7* 1.6* 1.7* 1.7*     CBG: Recent Labs  Lab 05/25/19 0718 05/26/19 0755 05/27/19 0845 05/28/19 0815 05/28/19 0852  GLUCAP 106* 88 100* 104* 114*    Recent Results (from the past 240 hour(s))  SARS Coronavirus 2 (CEPHEID - Performed in [facility] hospital lab), Hosp Order     Status: None   Collection Time: 05/19/19 11:42 AM   Specimen: Nasopharyngeal Swab  Result Value Ref Range Status   SARS Coronavirus 2 NEGATIVE NEGATIVE Final    Comment: (NOTE) If result is NEGATIVE SARS-CoV-2 target nucleic acids are NOT DETECTED. The SARS-CoV-2 RNA is generally detectable in upper and lower  respiratory specimens during the acute phase of infection. The lowest  concentration of SARS-CoV-2 viral copies this assay can detect is 250  copies / mL. A negative result does not preclude SARS-CoV-2 infection  and should not be used as the sole basis for treatment or other  patient management decisions.  A negative result may occur with  improper specimen collection / handling, submission of specimen other  than nasopharyngeal swab, presence of viral mutation(s) within the  areas targeted by this assay, and inadequate number of viral copies  (<250 copies / mL). A negative result must be combined with clinical  observations, patient history, and epidemiological information. If result is POSITIVE SARS-CoV-2 target nucleic acids are DETECTED. The SARS-CoV-2 RNA is generally detectable in upper and lower  respiratory specimens dur ing the acute phase of infection.  Positive  results are indicative of active infection with SARS-CoV-2.  Clinical  correlation with patient history and other diagnostic information is  necessary to determine patient infection status.  Positive results do  not rule out bacterial infection or co-infection with other viruses. If result is PRESUMPTIVE POSTIVE SARS-CoV-2 nucleic acids MAY BE PRESENT.   A presumptive positive result was obtained on the submitted specimen  and confirmed on  repeat testing.  While 2019 novel coronavirus  (SARS-CoV-2) nucleic acids may be present in the submitted sample  additional confirmatory testing may be necessary for epidemiological  and / or clinical management purposes  to differentiate between  SARS-CoV-2 and other Sarbecovirus currently known to infect humans.  If clinically indicated additional testing with an alternate test  methodology [id number]) is advised. The SARS-CoV-2 RNA is generally  detectable in upper and lower respiratory sp ecimens during the acute  phase of infection. The expected result is Negative. Fact Sheet for Patients:  BoilerBrush.com.cyhttps://www.fda.gov/media/136312/download Fact Sheet for Healthcare Providers: https://pope.com/https://www.fda.gov/media/136313/download This test is not yet approved or cleared by the [place] FDA and has been authorized for detection and/or diagnosis of SARS-CoV-2 by FDA under an Emergency Use Authorization (EUA).  This EUA will remain in effect (meaning this test can be used) for the duration of the COVID-19 declaration under Section 564(b)(1) of the Act, 21 U.S.C. section 360bbb-3(b)(1), unless the authorization is terminated or revoked sooner. Performed at [facility], [address] [place], [place] [address]   Culture, Urine     Status: None   Collection Time: 05/19/19  6:11 PM  Specimen: Urine, Random  Result Value Ref Range Status   Specimen Description   Final    URINE, RANDOM Performed at [facility], [address] [place], [place] [address]    Special Requests   Final    NONE Performed at [facility], [address] [place], [place] [address]    Culture   Final    NO GROWTH Performed at [facility], [address] [place], [place] [address]    Report Status 05/22/2019 FINAL  Final  Culture, blood (routine x 2)     Status: None   Collection Time: 05/19/19  7:01 PM   Specimen: BLOOD  Result Value Ref Range  Status   Specimen Description   Final    BLOOD LEFT ANTECUBITAL Performed at [facility], [address] [place], [place] [address]    Special Requests   Final    BOTTLES DRAWN AEROBIC ONLY Blood Culture adequate volume Performed at [facility], [address] [place], [place] [address]    Culture   Final    NO GROWTH 5 DAYS Performed at [facility], [address] [place], [place] [address]    Report Status 05/25/2019 FINAL  Final  Culture, blood (routine x 2)     Status: None   Collection Time: 05/19/19  7:01 PM   Specimen: BLOOD LEFT HAND  Result Value Ref Range Status   Specimen Description   Final    BLOOD LEFT HAND Performed at [facility], [address] [place], [place] [address]    Special Requests   Final    BOTTLES DRAWN AEROBIC ONLY Blood Culture adequate volume Performed at [facility], [address] [place], [place] [address]    Culture   Final    NO GROWTH 5 DAYS Performed at [facility], [address] [place], [place] [address]    Report Status 05/25/2019 FINAL  Final  Respiratory Panel by PCR     Status: None   Collection Time: 05/20/19  2:12 PM   Specimen: Nasopharyngeal Swab; Respiratory  Result Value Ref Range Status   Adenovirus NOT DETECTED NOT DETECTED Final   Coronavirus 229E NOT DETECTED NOT DETECTED Final    Comment: (NOTE) The Coronavirus on the Respiratory Panel, DOES NOT test for the novel  Coronavirus (2019 nCoV)    Coronavirus HKU1 NOT DETECTED NOT DETECTED Final   Coronavirus NL63 NOT DETECTED NOT DETECTED Final   Coronavirus OC43 NOT DETECTED NOT DETECTED Final   Metapneumovirus NOT DETECTED NOT DETECTED Final   Rhinovirus / Enterovirus NOT DETECTED NOT DETECTED Final   Influenza A NOT DETECTED NOT DETECTED Final   Influenza B NOT DETECTED NOT DETECTED Final   Parainfluenza Virus 1 NOT DETECTED NOT DETECTED Final   Parainfluenza Virus  2 NOT DETECTED NOT DETECTED Final   Parainfluenza Virus 3 NOT DETECTED NOT DETECTED Final   Parainfluenza Virus 4 NOT DETECTED NOT DETECTED Final   Respiratory Syncytial Virus NOT DETECTED NOT DETECTED Final   Bordetella pertussis NOT DETECTED NOT DETECTED Final   Chlamydophila pneumoniae NOT DETECTED NOT DETECTED Final   Mycoplasma pneumoniae NOT DETECTED NOT DETECTED Final    Comment: Performed at [facility], [address] [place], [place] [address]  MRSA PCR Screening     Status: None   Collection Time: 05/20/19  2:12 PM   Specimen: Nasal Mucosa; Nasopharyngeal  Result Value Ref  Range Status   MRSA by PCR NEGATIVE NEGATIVE Final    Comment:        The GeneXpert MRSA Assay (FDA approved for NASAL specimens only), is one component of a comprehensive MRSA colonization surveillance program. It is not intended to diagnose MRSA infection nor to guide or monitor treatment for MRSA infections. Performed at [facility], [address] [place], [place] [address]   Novel Coronavirus, NAA (hospital order; send-out to ref lab)     Status: Abnormal   Collection Time: 05/24/19  1:28 PM   Specimen: Nasopharyngeal Swab; Respiratory  Result Value Ref Range Status   SARS-CoV-2, NAA DETECTED (A) NOT DETECTED Final    Comment: RESULT CALLED TO, READ BACK BY AND VERIFIED WITH: MARRISA LONG @1507  05/26/2019 C VARNER (NOTE)                  Client Requested Flag This test was developed and its performance characteristics determined by [facility]. This test has not been FDA cleared or approved. This test has been authorized by FDA under an Emergency Use Authorization (EUA). This test is only authorized for the duration of time the declaration that circumstances exist justifying the authorization of the emergency use of in vitro diagnostic tests for detection of SARS-CoV-2 virus and/or diagnosis of COVID-19 infection under section 564(b)(1) of the Act, 21  U.S.C. 604VWU-9(W)(1360bbb-3(b)(1), unless the authorization is terminated or revoked sooner. When diagnostic testing is negative, the possibility of a false negative result should be considered in the context of a patient's recent exposures and the presence of clinical signs and symptoms consistent with COVID-19. An individual without symptoms of C OVID-19 and who is not shedding SARS-CoV-2 virus would expect to have a negative (not detected) result in this assay. Performed At: [facility] [address] [place], [place] [address] Jolene SchimkeNagendra Sanjai MD AO:[id number]:[phone]    Coronavirus Source NASOPHARYNGEAL  Final    Comment: Performed at [facility], [address] [place], [place] [address]     Scheduled Meds: . cyanocobalamin  1,000 mcg Subcutaneous Daily  . guaiFENesin  600 mg Oral BID  . ipratropium  2 puff Inhalation BID  . levalbuterol  1 puff Inhalation BID  . lisinopril  40 mg Oral Daily  . metoprolol tartrate  25 mg Oral BID  . multivitamin with minerals  1 tablet Oral Daily  . senna-docusate  1 tablet Oral BID  . [START ON 05/31/2019] vitamin B-12  1,000 mcg Oral Daily  . Warfarin - Pharmacist Dosing Inpatient   Does not apply q1800     LOS: 10 days   Lonia BloodJeffrey T. Haden Cavenaugh, MD [facility] Office  [phone] Pager - Text Page per Amion  If 7PM-7AM, please contact night-coverage per Amion 05/29/2019, 8:29 AM

## 2019-05-29 NOTE — Progress Notes (Signed)
Occupational Therapy Treatment Patient Details Name: [name] MRN: [id number] DOB: 1923/05/05 Today's Date: 05/29/2019    History of present illness 85 year old female admitted 05/19/19 for nausea, vomiting and chills. Tested negative for corona virus; Dx'd with pna. 05/24/19 tested + COVID (re-tested for discharge planning) PMH:  arthritis, endometrial CA, SA node dsyfunction, pacemaker, COPD, and HTN   OT comments  Pt making progress towards OT goals at this time. She is eager to return home, shares that she's had 4 falls in the past month. (decreased safety awareness) does do her own modified IADL (uses the microwave a lot for cooking) and has assist from children (step-children). However, she continues to have decreased activity tolerance for functional mobility and also during standing grooming tasks. She currently requires SNF level care to address deconditioning, generalized weakness and improve balance.    Follow Up Recommendations  SNF    Equipment Recommendations  None recommended by OT    Recommendations for Other Services      Precautions / Restrictions Precautions Precautions: Fall Precaution Comments: monitor O2 Restrictions Weight Bearing Restrictions: No       Mobility Bed Mobility               General bed mobility comments: OOB in recliner at beginning and end of the session  Transfers Overall transfer level: Needs assistance Equipment used: Rolling walker (2 wheeled) Transfers: Sit to/from Stand Sit to Stand: Min guard         General transfer comment: incr time and effort; states she uses lift chair at home    Balance Overall balance assessment: Needs assistance Sitting-balance support: No upper extremity supported;Feet supported Sitting balance-Leahy Scale: Good     Standing balance support: Bilateral upper extremity supported Standing balance-Leahy Scale: Fair Standing balance comment: ok for static standing without leaning aginst  sink for grooming tasks                           ADL either performed or assessed with clinical judgement   ADL Overall ADL's : Needs assistance/impaired     Grooming: Oral care;Brushing hair;Standing;Min guard Grooming Details (indicate cue type and reason): sink in room                 Toilet Transfer: Min guard;Ambulation;RW   Toileting- Clothing Manipulation and Hygiene: Moderate assistance;Sit to/from stand Toileting - Clothing Manipulation Details (indicate cue type and reason): mod A to don mesh undies with liner inserted     Functional mobility during ADLs: Min guard;Rolling walker;Cueing for safety(cues to bring walker closer to her body) General ADL Comments: Pt on RA throughout session, slightly DOE (2/4) with walking into hallway, decreased safety awareness     Vision       Perception     Praxis      Cognition Arousal/Alertness: Awake/alert Behavior During Therapy: WFL for tasks assessed/performed Overall Cognitive Status: Within Functional Limits for tasks assessed                                 General Comments: potentially decreased safety awareness (states that she's had 4 falls in the past month) very pleasant, likes to talk and 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        Exercises     Shoulder Instructions       General Comments  Pertinent Vitals/ Pain       Pain Assessment: No/denies pain Pain Intervention(s): Monitored during session  Home Living                                          Prior Functioning/Environment              Frequency  Min 3X/week[facility]        Progress Toward Goals  OT Goals(current goals can now be found in the care plan section)  Progress towards OT goals: Progressing toward goals  Acute Rehab OT Goals Patient Stated Goal: to get back home OT Goal Formulation: With patient Time For Goal Achievement: 06/03/19 Potential to  Achieve Goals: Good  Plan Discharge plan remains appropriate;Frequency needs to be updated    Co-evaluation                 AM-PAC OT "6 Clicks" Daily Activity     Outcome Measure   Help from another person eating meals?: None Help from another person taking care of personal grooming?: A Little Help from another person toileting, which includes using toliet, bedpan, or urinal?: A Little Help from another person bathing (including washing, rinsing, drying)?: A Little Help from another person to put on and taking off regular upper body clothing?: A Little Help from another person to put on and taking off regular lower body clothing?: A Little 6 Click Score: 19    End of Session Equipment Utilized During Treatment: Gait belt;Rolling walker  OT Visit Diagnosis: Muscle weakness (generalized) (M62.81);Unsteadiness on feet (R26.81)   Activity Tolerance Patient tolerated treatment well   Patient Left in chair;with call bell/phone within reach;with chair alarm set   Nurse Communication Mobility status        Time: [id number] OT Time Calculation (min): 38 min  Charges: OT General Charges $OT Visit: 1 Visit OT Treatments $Self Care/Home Management : 23-37 mins $Therapeutic Activity: 8-22 mins  Sherryl MangesLaura Shivank Pinedo OTR/L Acute Rehabilitation Services Pager: [phone] Office: [phone]   Evern BioLaura J Yavuz Kirby 05/29/2019, 12:57 PM

## 2019-05-29 NOTE — Progress Notes (Signed)
ANTICOAGULATION CONSULT NOTE  Pharmacy Consult for warfarin Indication: A-fib  No Known Allergies  Patient Measurements: Height: 5\' 5"  (165.1 cm) Weight: 143 lb 15.4 oz (65.3 kg) IBW/kg (Calculated) : 57  Vital Signs: Temp: 97.3 F (36.3 C) (07/17 0807) Temp Source: Oral (07/17 0807) BP: 111/64 (07/17 0935) Pulse Rate: 61 (07/17 0935)  Labs: Recent Labs    05/27/19 0509 05/28/19 0230 05/29/19 0230  HGB  --   --  12.1  HCT  --   --  40.0  PLT  --   --  361  LABPROT 19.2* 19.7* 19.5*  INR 1.6* 1.7* 1.7*   Estimated Creatinine Clearance: 38.7 mL/min (by C-G formula based on SCr of 0.59 mg/dL).  Medical History: Past Medical History:  Diagnosis Date  . Arthritis   . Atrial fibrillation ([place])   . COPD (chronic obstructive pulmonary disease) ([place])   . Hip pain, left   . Hypertension   . Sinoatrial node dysfunction (HCC)     Assessment: [name] with PMH Afib on warfarin PTA, SA node dysfunction s/p pacemaker, COPD, admitted 7/7 for CAP. Pharmacy to continue warfarin while admitted.    Prior anticoagulation: warfarin 6 mg daily, last dose 7/6   INR still subtherapeutic today at 1.7. She has gotten 2 doses of coumadin 10mg  now. We will increase the dose again today.   Goal of Therapy: INR 2-3  Plan:  Warfarin 12.5mg  PO x 1 today   Daily INR  CBC at least q72h while on warfarin   Monitor for s/sx of bleeding    Onnie Boer, PharmD, BCIDP, AAHIVP, CPP Infectious Disease Pharmacist 05/29/2019 11:37 AM

## 2019-05-30 LAB — PROTIME-INR
INR: 2.1 — ABNORMAL HIGH (ref 0.8–1.2)
Prothrombin Time: 23.2 seconds — ABNORMAL HIGH (ref 11.4–15.2)

## 2019-05-30 MED ORDER — WARFARIN SODIUM 7.5 MG PO TABS
7.5000 mg | ORAL_TABLET | Freq: Once | ORAL | Status: AC
  Administered 2019-05-30: 7.5 mg via ORAL
  Filled 2019-05-30: qty 1

## 2019-05-30 NOTE — Progress Notes (Addendum)
[place] TEAM 1 - Stepdown/ICU TEAM  [name]  YHC:[id number] DOB: August 03, 1923 DOA: 05/19/2019 PCP: Sinda Du, MD    Brief Narrative:  85 year old with a history of arthritis, hip fracture, endometrial carcinoma, sinoatrial node dysfunction, pacemaker, atrial fibrillation on Coumadin, COPD, and HTN who presented from home with nausea, vomiting, chills, and shortness of breath which began the day of her admit.    Initial COVID-19 test in ED was negative.  Respiratory virus panel negative. Patient's respiratory status continued to worsen with hypoxia.  The pt improved markedly and was being prepared for d/c to a SNF when a repeat COVID test done for SNF placement was incidentally found to be positive. She was therefore refused by the SNF.  Significant Events: 7/7 admit to [facility] 7/12 SARS-CoV-2 test for SNF placement positive  COVID-19 specific Treatment: None  Subjective: Awaiting SNF placement. Remains medically stable. No MD visit today.   Assessment & Plan:  Multilobar COVID pneumonia (Present on Admission) - acute hypoxic respiratory failure Right-sided multilobar pneumonia confirmed via CT chest - repeat SARS-CoV-2 test 7/12 positive - saturating 94-97% on room air - awaiting negative test to allow for SNF placement - unsure when we could expect a negative test - consider retesting after 7 days (7/19)  COPD Does not require chronic O2 - well compensated at present  HTN Blood pressure controlled  Atrial fibrillation with SSS status post pacemaker Continue beta-blocker - Coumadin dosing per pharmacy -rate controlled  Chronic diastolic CHF On chronic as needed Lasix - no significant volume overload presently  Macrocytic anemia - B12 deficiency Likely pernicious anemia related to advanced age - added subcutaneous replacement x3 days to assure stores replenished - oral supplementation may not be sufficient - will need to be followed up as outpatient  Deconditioning  Awaiting SNF placement  DVT prophylaxis: Warfarin Code Status: DNR - NO CODE  Family Communication:  Disposition Plan: Awaiting SNF placement -SNF requiring negative COVID test prior to acceptance  Consultants:  none  Antimicrobials:  None  Objective: Blood pressure 111/60, pulse 74, temperature 98.2 F (36.8 C), temperature source Oral, resp. rate 12, height 5\' 5"  (1.651 m), weight 65.3 kg, SpO2 96 %. No intake or output data in the 24 hours ending 05/30/19 1507 Filed Weights   05/26/19 0529 05/27/19 0635 05/28/19 0500  Weight: 66 kg 66.5 kg 65.3 kg    Examination: No exam today  CBC: Recent Labs  Lab 05/26/19 0545 05/29/19 0230  WBC 6.1 8.1  HGB 11.4* 12.1  HCT 37.0 40.0  MCV 103.6* 104.7*  PLT 254 517   Basic Metabolic Panel: Recent Labs  Lab 05/25/19 0529  NA 136  K 3.9  CL 100  CO2 27  GLUCOSE 108*  BUN 16  CREATININE 0.59  CALCIUM 8.6*  MG 2.3   GFR: Estimated Creatinine Clearance: 38.7 mL/min (by C-G formula based on SCr of 0.59 mg/dL).  Liver Function Tests: No results for input(s): AST, ALT, ALKPHOS, BILITOT, PROT, ALBUMIN in the last 168 hours.  Coagulation Profile: Recent Labs  Lab 05/26/19 0545 05/27/19 0509 05/28/19 0230 05/29/19 0230 05/30/19 0330  INR 1.7* 1.6* 1.7* 1.7* 2.1*    Recent Results (from the past 240 hour(s))  Novel Coronavirus, NAA (hospital order; send-out to ref lab)     Status: Abnormal   Collection Time: 05/24/19  1:28 PM   Specimen: Nasopharyngeal Swab; Respiratory  Result Value Ref Range Status   SARS-CoV-2, NAA DETECTED (A) NOT DETECTED Final    Comment:  RESULT CALLED TO, READ BACK BY AND VERIFIED WITH: MARRISA LONG @1507  05/26/2019 C VARNER (NOTE)                  Client Requested Flag This test was developed and its performance characteristics determined by [facility]. This test has not been FDA cleared or approved. This test has been authorized by FDA under an Emergency Use Authorization  (EUA). This test is only authorized for the duration of time the declaration that circumstances exist justifying the authorization of the emergency use of in vitro diagnostic tests for detection of SARS-CoV-2 virus and/or diagnosis of COVID-19 infection under section 564(b)(1) of the Act, 21 U.S.C. 409WJX-9(J)(4360bbb-3(b)(1), unless the authorization is terminated or revoked sooner. When diagnostic testing is negative, the possibility of a false negative result should be considered in the context of a patient's recent exposures and the presence of clinical signs and symptoms consistent with COVID-19. An individual without symptoms of C OVID-19 and who is not shedding SARS-CoV-2 virus would expect to have a negative (not detected) result in this assay. Performed At: [facility] [address] [place], [place] [id number] Jolene SchimkeNagendra Sanjai MD YQ:[id number]:[phone]    Coronavirus Source NASOPHARYNGEAL  Final    Comment: Performed at [facility], [address] [place], [place] [address]     Scheduled Meds:  lisinopril  40 mg Oral Daily   metoprolol tartrate  25 mg Oral BID   multivitamin with minerals  1 tablet Oral Daily   senna-docusate  1 tablet Oral BID   [START ON 05/31/2019] vitamin B-12  1,000 mcg Oral Daily   warfarin  7.5 mg Oral ONCE-1800   Warfarin - Pharmacist Dosing Inpatient   Does not apply q1800     LOS: 11 days   Lonia BloodJeffrey T. Merlyn Bollen, MD [facility] Office  [phone] Pager - Text Page per Loretha StaplerAmion  If 7PM-7AM, please contact night-coverage per Amion 05/30/2019, 3:07 PM

## 2019-05-30 NOTE — Progress Notes (Signed)
ANTICOAGULATION CONSULT NOTE  Pharmacy Consult for warfarin Indication: A-fib  No Known Allergies  Patient Measurements: Height: 5\' 5"  (165.1 cm) Weight: 143 lb 15.4 oz (65.3 kg) IBW/kg (Calculated) : 57  Vital Signs: Temp: 98.2 F (36.8 C) (07/18 0900) Temp Source: Oral (07/18 0900) BP: 111/60 (07/18 0940)  Labs: Recent Labs    05/28/19 0230 05/29/19 0230 05/30/19 0330  HGB  --  12.1  --   HCT  --  40.0  --   PLT  --  361  --   LABPROT 19.7* 19.5* 23.2*  INR 1.7* 1.7* 2.1*   Estimated Creatinine Clearance: 38.7 mL/min (by C-G formula based on SCr of 0.59 mg/dL).  Medical History: Past Medical History:  Diagnosis Date  . Arthritis   . Atrial fibrillation ([place])   . COPD (chronic obstructive pulmonary disease) ([place])   . Hip pain, left   . Hypertension   . Sinoatrial node dysfunction (HCC)     Assessment: 10 yoF with PMH Afib on warfarin PTA, SA node dysfunction s/p pacemaker, COPD, admitted 7/7 for CAP. Pharmacy to continue warfarin while admitted.    Prior anticoagulation: warfarin 6 mg daily, last dose 7/6  INR therapeutic today (requiring higher doses)   Goal of Therapy: INR 2-3  Plan:  Warfarin 7.5 mg PO x 1 today   Daily INR  CBC at least q72h while on warfarin   Monitor for s/sx of bleeding   Thank you Anette Guarneri, PharmD 05/30/2019 2:20 PM

## 2019-05-30 NOTE — Progress Notes (Signed)
Physical Therapy Treatment Patient Details Name: [name] H Funes MRN: [id number] DOB: 1923/08/11 Today's Date: 05/30/2019    History of Present Illness 85 year old female admitted 05/19/19 for nausea, vomiting and chills. Tested negative for corona virus; Dx'd with pna. 05/24/19 tested + COVID (re-tested for discharge planning) PMH:  arthritis, endometrial CA, SA node dsyfunction, pacemaker, COPD, and HTN    PT Comments    Assisted  Patient with min [facility] then to recliner. Continue PT for mobility./   Follow Up Recommendations  Supervision/Assistance - 24 hour;SNF     Equipment Recommendations  None recommended by PT    Recommendations for Other Services       Precautions / Restrictions Precautions Precautions: Fall Precaution Comments: monitor O2    Mobility  Bed Mobility Overal bed mobility: Needs Assistance Bed Mobility: Supine to Sit     Supine to sit: Supervision     General bed mobility comments: extra time  Transfers Overall transfer level: Needs assistance Equipment used: Rolling walker (2 wheeled) Transfers: Sit to/from [employer];Stand Pivot Transfers Sit to Stand: Min assist Stand pivot transfers: Min guard       General transfer comment: steady assist to rise from bed and BSC, cues to reach back. [facility] then to recliner x 3"  Ambulation/Gait                 Stairs             Wheelchair Mobility    Modified Rankin (Stroke Patients Only)       Balance Overall balance assessment: Needs assistance;History of Falls Sitting-balance support: No upper extremity supported;Feet supported Sitting balance-Leahy Scale: Good     Standing balance support: Bilateral upper extremity supported Standing balance-Leahy Scale: Fair                              Cognition Arousal/Alertness: Awake/alert Behavior During Therapy: WFL for tasks assessed/performed Overall Cognitive Status: Within Functional  Limits for tasks assessed                                        Exercises      General Comments        Pertinent Vitals/Pain Faces Pain Scale: Hurts little more Pain Location: generalized Pain Descriptors / Indicators: Sore Pain Intervention(s): Monitored during session    Home Living                      Prior Function            PT Goals (current goals can now be found in the care plan section)      Frequency    Min 2X/week      PT Plan Current plan remains appropriate    Co-evaluation              AM-PAC PT "6 Clicks" Mobility   Outcome Measure  Help needed turning from your back to your side while in a flat bed without using bedrails?: None Help needed moving from lying on your back to sitting on the side of a flat bed without using bedrails?: None Help needed moving to and from a bed to a chair (including a wheelchair)?: A Little Help needed standing up from a chair using your arms (e.g., wheelchair or bedside  chair)?: A Little Help needed to walk in hospital room?: A Little Help needed climbing 3-5 steps with a railing? : A Lot 6 Click Score: 19    End of Session   Activity Tolerance: Patient tolerated treatment well Patient left: in chair;with call bell/phone within reach Nurse Communication: Mobility status PT Visit Diagnosis: Difficulty in walking, not elsewhere classified (R26.2);Muscle weakness (generalized) (M62.81)     Time: 7017-7939 PT Time Calculation (min) (ACUTE ONLY): 23 min  Charges:  $Therapeutic Activity: 23-37 mins                     Tresa Endo PT Acute Rehabilitation Services Pager [phone] Office [phone]    Claretha Cooper 05/30/2019, 10:39 AM

## 2019-05-30 NOTE — Progress Notes (Signed)
Updated step daughterVelva Harman on pt progress

## 2019-05-31 LAB — PROTIME-INR
INR: 2.3 — ABNORMAL HIGH (ref 0.8–1.2)
Prothrombin Time: 24.6 seconds — ABNORMAL HIGH (ref 11.4–15.2)

## 2019-05-31 LAB — SARS CORONAVIRUS 2 BY RT PCR (HOSPITAL ORDER, PERFORMED IN ~~LOC~~ HOSPITAL LAB): SARS Coronavirus 2: NEGATIVE

## 2019-05-31 MED ORDER — WARFARIN SODIUM 7.5 MG PO TABS
7.5000 mg | ORAL_TABLET | Freq: Once | ORAL | Status: AC
  Administered 2019-05-31: 17:00:00 7.5 mg via ORAL
  Filled 2019-05-31: qty 1

## 2019-05-31 NOTE — Progress Notes (Signed)
ANTICOAGULATION CONSULT NOTE  Pharmacy Consult for warfarin Indication: A-fib  No Known Allergies  Patient Measurements: Height: 5\' 5"  (165.1 cm) Weight: 143 lb 15.4 oz (65.3 kg) IBW/kg (Calculated) : 57  Vital Signs: Temp: 97.5 F (36.4 C) (07/19 0904) Temp Source: Oral (07/19 0904) BP: 132/56 (07/19 0904) Pulse Rate: 69 (07/19 0904)  Labs: Recent Labs    05/29/19 0230 05/30/19 0330 05/31/19 0336  HGB 12.1  --   --   HCT 40.0  --   --   PLT 361  --   --   LABPROT 19.5* 23.2* 24.6*  INR 1.7* 2.1* 2.3*   Estimated Creatinine Clearance: 38.7 mL/min (by C-G formula based on SCr of 0.59 mg/dL).  Medical History: Past Medical History:  Diagnosis Date  . Arthritis   . Atrial fibrillation ([place])   . COPD (chronic obstructive pulmonary disease) ([place])   . Hip pain, left   . Hypertension   . Sinoatrial node dysfunction (HCC)     Assessment: 11 yoF with PMH Afib on warfarin PTA, SA node dysfunction s/p pacemaker, COPD, admitted 7/7 for CAP. Pharmacy to continue warfarin while admitted.    Prior anticoagulation: warfarin 6 mg daily, last dose 7/6  INR therapeutic today (requiring higher doses)   Goal of Therapy: INR 2-3  Plan:  Repeat Warfarin 7.5 mg PO x 1 today   Daily INR  CBC at least q72h while on warfarin   Monitor for s/sx of bleeding   Thank you Anette Guarneri, PharmD 05/31/2019 2:27 PM

## 2019-05-31 NOTE — Progress Notes (Signed)
[place] TEAM 1 - Stepdown/ICU TEAM  [name]  NOM:[id number] DOB: 02-14-1923 DOA: 05/19/2019 PCP: Sinda Du, MD    Brief Narrative:  85 year old with a history of arthritis, hip fracture, endometrial carcinoma, sinoatrial node dysfunction, pacemaker, atrial fibrillation on Coumadin, COPD, and HTN who presented from home with nausea, vomiting, chills, and shortness of breath which began the day of her admit.    Initial COVID-19 test in ED was negative.  Respiratory virus panel negative. Patient's respiratory status continued to worsen with hypoxia.  The pt improved markedly and was being prepared for d/c to a SNF when a repeat COVID test done for SNF placement was incidentally found to be positive. She was therefore refused by the SNF.  Significant Events: 7/7 admit to [facility] 7/12 SARS-CoV-2 test for SNF placement positive  COVID-19 specific Treatment: None  Subjective: Patient is sitting up in a bedside chair.  She has no new complaints.  She tells me she is very much interested in going home.  She is willing to consider a short-term rehab placement.  Assessment & Plan:  Multilobar COVID pneumonia (Present on Admission) - acute hypoxic respiratory failure Right-sided multilobar pneumonia confirmed via CT chest - repeat SARS-CoV-2 test 7/12 positive - saturating 94-97% on room air - awaiting negative test to allow for SNF placement - unsure when we could expect a negative test - retest today  COPD Does not require chronic O2 - well compensated at present  HTN Blood pressure controlled  Atrial fibrillation with SSS status post pacemaker Continue beta-blocker - Coumadin dosing per pharmacy -rate controlled  Chronic diastolic CHF On chronic as needed Lasix - no significant volume overload presently  Macrocytic anemia - B12 deficiency Likely pernicious anemia related to advanced age - added subcutaneous replacement x3 days to assure stores replenished - oral  supplementation may not be sufficient - will need to be followed up as outpatient  Deconditioning Awaiting SNF placement  DVT prophylaxis: Warfarin Code Status: DNR - NO CODE  Family Communication:  Disposition Plan: Awaiting SNF placement -SNF requiring negative COVID test prior to acceptance  Consultants:  none  Antimicrobials:  None  Objective: Blood pressure (!) 132/56, pulse 69, temperature (!) 97.5 F (36.4 C), temperature source Oral, resp. rate 16, height 5\' 5"  (1.651 m), weight 65.3 kg, SpO2 90 %. No intake or output data in the 24 hours ending 05/31/19 0919 Filed Weights   05/26/19 0529 05/27/19 0635 05/28/19 0500  Weight: 66 kg 66.5 kg 65.3 kg    Examination: General: No acute respiratory distress Lungs: Clear to auscultation bilaterally  Cardiovascular: Regular rate  Abdomen: Nontender, nondistended, soft, bowel sounds positive Extremities: No significant edema bilateral lower extremities   CBC: Recent Labs  Lab 05/26/19 0545 05/29/19 0230  WBC 6.1 8.1  HGB 11.4* 12.1  HCT 37.0 40.0  MCV 103.6* 104.7*  PLT 254 962   Basic Metabolic Panel: Recent Labs  Lab 05/25/19 0529  NA 136  K 3.9  CL 100  CO2 27  GLUCOSE 108*  BUN 16  CREATININE 0.59  CALCIUM 8.6*  MG 2.3   GFR: Estimated Creatinine Clearance: 38.7 mL/min (by C-G formula based on SCr of 0.59 mg/dL).  Liver Function Tests: No results for input(s): AST, ALT, ALKPHOS, BILITOT, PROT, ALBUMIN in the last 168 hours.  Coagulation Profile: Recent Labs  Lab 05/27/19 0509 05/28/19 0230 05/29/19 0230 05/30/19 0330 05/31/19 0336  INR 1.6* 1.7* 1.7* 2.1* 2.3*    Recent Results (from the past 240  hour(s))  Novel Coronavirus, NAA (hospital order; send-out to ref lab)     Status: Abnormal   Collection Time: 05/24/19  1:28 PM   Specimen: Nasopharyngeal Swab; Respiratory  Result Value Ref Range Status   SARS-CoV-2, NAA DETECTED (A) NOT DETECTED Final    Comment: RESULT CALLED TO, READ  BACK BY AND VERIFIED WITH: MARRISA LONG @1507  05/26/2019 C VARNER (NOTE)                  Client Requested Flag This test was developed and its performance characteristics determined by [facility]. This test has not been FDA cleared or approved. This test has been authorized by FDA under an Emergency Use Authorization (EUA). This test is only authorized for the duration of time the declaration that circumstances exist justifying the authorization of the emergency use of in vitro diagnostic tests for detection of SARS-CoV-2 virus and/or diagnosis of COVID-19 infection under section 564(b)(1) of the Act, 21 U.S.C. 562ZHY-8(M)(5360bbb-3(b)(1), unless the authorization is terminated or revoked sooner. When diagnostic testing is negative, the possibility of a false negative result should be considered in the context of a patient's recent exposures and the presence of clinical signs and symptoms consistent with COVID-19. An individual without symptoms of C OVID-19 and who is not shedding SARS-CoV-2 virus would expect to have a negative (not detected) result in this assay. Performed At: [facility] [place] [address] [place], [place] [id number] Jolene SchimkeNagendra Sanjai MD MW:[phone]    Coronavirus Source NASOPHARYNGEAL  Final    Comment: Performed at [facility], [address] [place], [place] [address]     Scheduled Meds:  lisinopril  40 mg Oral Daily   metoprolol tartrate  25 mg Oral BID   multivitamin with minerals  1 tablet Oral Daily   senna-docusate  1 tablet Oral BID   vitamin B-12  1,000 mcg Oral Daily   Warfarin - Pharmacist Dosing Inpatient   Does not apply q1800     LOS: 12 days   Lonia BloodJeffrey T. Merdis Snodgrass, MD [facility] Office  [phone] Pager - Text Page per Loretha StaplerAmion  If 7PM-7AM, please contact night-coverage per Amion 05/31/2019, 9:19 AM

## 2019-05-31 NOTE — Plan of Care (Signed)
  Problem: Education: Goal: Knowledge of risk factors and measures for prevention of condition will improve Outcome: Progressing   Problem: Respiratory: Goal: Will maintain a patent airway Outcome: Progressing   Problem: Clinical Measurements: Goal: Ability to maintain clinical measurements within normal limits will improve Outcome: Progressing

## 2019-05-31 NOTE — Progress Notes (Signed)
Occupational Therapy Treatment Patient Details Name: [name] MRN: [id number] DOB: [birth year] Today's Date: 05/31/2019    History of present illness 85 year old female admitted 05/19/19 for nausea, vomiting and chills. Tested negative for corona virus; Dx'd with pna. 05/24/19 tested + COVID (re-tested for discharge planning) PMH:  arthritis, endometrial CA, SA node dsyfunction, pacemaker, COPD, and HTN   OT comments  Pt progressing towards OT goals this session, able to ambulate into bathroom for toilet transfer, managing mesh underwear with mod A over feet. Pt DOE (2/4) with prolonged activity. Pt talking about wanting to go home instead of SNF. This concerns me because of her decreased activity tolerance, generalized deconditioning, and decreased safety awareness. Pt continues to benefit from skilled OT acutely and post-acute at the SNF level.    Follow Up Recommendations  SNF    Equipment Recommendations  None recommended by OT    Recommendations for Other Services      Precautions / Restrictions Precautions Precautions: Fall Precaution Comments: monitor O2 Restrictions Weight Bearing Restrictions: No       Mobility Bed Mobility               General bed mobility comments: OOB in recliner at beginning and end of the session  Transfers Overall transfer level: Needs assistance Equipment used: Rolling walker (2 wheeled) Transfers: Sit to/from Stand Sit to Stand: Min guard         General transfer comment: incr time and effort; states she uses lift chair at home    Balance Overall balance assessment: Needs assistance Sitting-balance support: No upper extremity supported;Feet supported Sitting balance-Leahy Scale: Good     Standing balance support: Bilateral upper extremity supported Standing balance-Leahy Scale: Fair Standing balance comment: ok for static standing without leaning aginst sink for grooming tasks                           ADL  either performed or assessed with clinical judgement   ADL Overall ADL's : Needs assistance/impaired     Grooming: Standing;Min guard;Wash/dry hands;Wash/dry face Grooming Details (indicate cue type and reason): sink in bathroom                 Toilet Transfer: Min guard;Ambulation;RW Toilet Transfer Details (indicate cue type and reason): into bathroom Toileting- Clothing Manipulation and Hygiene: Moderate assistance;Sit to/from stand Toileting - Clothing Manipulation Details (indicate cue type and reason): mod A to don mesh undies with liner inserted     Functional mobility during ADLs: Min guard;Rolling walker;Cueing for safety(cues to bring walker closer to her body) General ADL Comments: Pt on RA throughout session, slightly DOE (2/4) with walking into bathroom, decreased safety awareness     Vision       Perception     Praxis      Cognition Arousal/Alertness: Awake/alert Behavior During Therapy: WFL for tasks assessed/performed Overall Cognitive Status: Within Functional Limits for tasks assessed                                 General Comments: potentially decreased safety awareness (states that she's had 4 falls in the past month) very pleasant, likes to talk and 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        Exercises     Shoulder Instructions       General Comments      Pertinent  Vitals/ Pain       Pain Assessment: No/denies pain Pain Intervention(s): Monitored during session  Home Living                                          Prior Functioning/Environment              Frequency  Min 3X/week([facility]        Progress Toward Goals  OT Goals(current goals can now be found in the care plan section)     Acute Rehab OT Goals Patient Stated Goal: to get back home OT Goal Formulation: With patient Time For Goal Achievement: 06/03/19 Potential to Achieve Goals: Good  Plan Discharge plan  remains appropriate;Frequency needs to be updated    Co-evaluation                 AM-PAC OT "6 Clicks" Daily Activity     Outcome Measure   Help from another person eating meals?: None Help from another person taking care of personal grooming?: A Little Help from another person toileting, which includes using toliet, bedpan, or urinal?: A Little Help from another person bathing (including washing, rinsing, drying)?: A Little Help from another person to put on and taking off regular upper body clothing?: A Little Help from another person to put on and taking off regular lower body clothing?: A Little 6 Click Score: 19    End of Session Equipment Utilized During Treatment: Gait belt;Rolling walker  OT Visit Diagnosis: Muscle weakness (generalized) (M62.81);Unsteadiness on feet (R26.81)   Activity Tolerance Patient tolerated treatment well   Patient Left in chair;with call bell/phone within reach;with chair alarm set   Nurse Communication Mobility status        Time: 1130-1156 OT Time Calculation (min): 26 min  Charges: OT General Charges $OT Visit: 1 Visit OT Treatments $Self Care/Home Management : 23-37 mins  Sherryl MangesLaura Cadon Raczka OTR/L Acute Rehabilitation Services Pager: [phone] Office: [phone]   Evern BioLaura J Novah Goza 05/31/2019, 3:37 PM

## 2019-06-01 LAB — CBC
HCT: 37.7 % (ref 36.0–46.0)
Hemoglobin: 11.4 g/dL — ABNORMAL LOW (ref 12.0–15.0)
MCH: 31.5 pg (ref 26.0–34.0)
MCHC: 30.2 g/dL (ref 30.0–36.0)
MCV: 104.1 fL — ABNORMAL HIGH (ref 80.0–100.0)
Platelets: 318 10*3/uL (ref 150–400)
RBC: 3.62 MIL/uL — ABNORMAL LOW (ref 3.87–5.11)
RDW: 16 % — ABNORMAL HIGH (ref 11.5–15.5)
WBC: 7.8 10*3/uL (ref 4.0–10.5)
nRBC: 0 % (ref 0.0–0.2)

## 2019-06-01 LAB — PROTIME-INR
INR: 2.3 — ABNORMAL HIGH (ref 0.8–1.2)
Prothrombin Time: 25.1 seconds — ABNORMAL HIGH (ref 11.4–15.2)

## 2019-06-01 MED ORDER — WARFARIN SODIUM 7.5 MG PO TABS
7.5000 mg | ORAL_TABLET | Freq: Once | ORAL | Status: AC
  Administered 2019-06-01: 7.5 mg via ORAL
  Filled 2019-06-01: qty 1

## 2019-06-01 NOTE — Progress Notes (Signed)
Physical Therapy Treatment Patient Details Name: [name] H Bralley MRN: [id number] DOB: 01/01/1923 Today's Date: 06/01/2019    History of Present Illness 85 year old female admitted 05/19/19 for nausea, vomiting and chills. Tested negative for corona virus; Dx'd with pna. 05/24/19 tested + COVID (re-tested for discharge planning) PMH:  arthritis, endometrial CA, SA node dsyfunction, pacemaker, COPD, and HTN    PT Comments    Patient reports she remains weaker than her baseline. Fatigued after walk to bathroom, washing hands, and walk back to chair (20 ft x 2). SpO2 >96% on room air, however RR does increase. Able to describe how she carries her phone in a walker bag and how she prepares simple meals in her kitchen and then slides them along the counter to where she can sit down and reach them to eat. Continues to benefit from PT.     Follow Up Recommendations  Supervision/Assistance - 24 hour;SNF     Equipment Recommendations  None recommended by PT    Recommendations for Other Services       Precautions / Restrictions Precautions Precautions: Fall Precaution Comments: monitor O2 Restrictions Weight Bearing Restrictions: No    Mobility  Bed Mobility               General bed mobility comments: sitting EOB with head resting on foot board on arrival "I was thinking about getting over to that chair but I knew I shouldn't try it by myself"  Transfers Overall transfer level: Needs assistance Equipment used: Rolling walker (2 wheeled) Transfers: Sit to/from Stand Sit to Stand: Min guard         General transfer comment: incr time and effort; states she [facility] lift [facility] so she has armrests to push up on; required PT stabilize one side of RW as she stood from bed and then from toilet; from recliner could use bil armrests with closeguarding but no asssist  Ambulation/Gait Ambulation/Gait assistance: Min guard Gait Distance (Feet): 20 Feet(x2  (seated rest at toilet)) Assistive device: Rolling walker (2 wheeled) Gait Pattern/deviations: Step-through pattern;Decreased step length - right;Decreased step length - left;Trunk flexed Gait velocity: decreased   General Gait Details: mild dyspnea going to bathroom; slightly more when returning to chair; SaO2 >96% throughout   Stairs             Wheelchair Mobility    Modified Rankin (Stroke Patients Only)       Balance Overall balance assessment: Needs assistance Sitting-balance support: No upper extremity supported;Feet supported Sitting balance-Leahy Scale: Good     Standing balance support: Single extremity supported;During functional activity Standing balance-Leahy Scale: Poor Standing balance comment: leans on forearms when washing hands at sink; standing reaching overhead alternating arms                            Cognition Arousal/Alertness: Awake/alert Behavior During Therapy: WFL for tasks assessed/performed Overall Cognitive Status: Within Functional Limits for tasks assessed                                        Exercises General Exercises - Lower Extremity Hip Flexion/Marching: Strengthening;Both;10 reps;Standing(bil UE support on RW) Other Exercises Other Exercises: stand at RW alternating shoulder flexion to activate core, improve posture and challenge balance; each arm x 5    General Comments General comments (  skin integrity, edema, etc.): At toilet, used one hand at a time to pull down underwear with other hand stabilizing on RW; same when stood to pull up underwear      Pertinent Vitals/Pain Pain Assessment: No/denies pain    Home Living                      Prior Function            PT Goals (current goals can now be found in the care plan section) Acute Rehab PT Goals Patient Stated Goal: to get back home Time For Goal Achievement: 06/03/19 Potential to Achieve Goals: Fair Progress towards PT  goals: Progressing toward goals    Frequency    Min 2X/week      PT Plan Current plan remains appropriate    Co-evaluation              AM-PAC PT "6 Clicks" Mobility   Outcome Measure  Help needed turning from your back to your side while in a flat bed without using bedrails?: None Help needed moving from lying on your back to sitting on the side of a flat bed without using bedrails?: None Help needed moving to and from a bed to a chair (including a wheelchair)?: A Little Help needed standing up from a chair using your arms (e.g., wheelchair or bedside chair)?: A Little Help needed to walk in hospital room?: A Little Help needed climbing 3-5 steps with a railing? : A Lot 6 Click Score: 19    End of Session Equipment Utilized During Treatment: Gait belt Activity Tolerance: Patient tolerated treatment well Patient left: in chair;with call bell/phone within reach   PT Visit Diagnosis: Difficulty in walking, not elsewhere classified (R26.2);Muscle weakness (generalized) (M62.81)     Time: 4315-4008 PT Time Calculation (min) (ACUTE ONLY): 32 min  Charges:  $Gait Training: 8-22 mins $Therapeutic Exercise: 8-22 mins                        [employer], PT 06/01/2019, 11:28 AM

## 2019-06-01 NOTE — Progress Notes (Signed)
[facility] TEAM 1 - Stepdown/ICU TEAM  Fenton Foyempie H Pomplun  ZOX:[id number] DOB: 12/20/1922 DOA: 05/19/2019 PCP: [name], Edward, MD    Brief Narrative:  85 year old with a history of arthritis, hip fracture, endometrial carcinoma, sinoatrial node dysfunction, pacemaker, atrial fibrillation on Coumadin, COPD, and HTN who presented from home with nausea, vomiting, chills, and shortness of breath which began the day of her admit.    Initial COVID-19 test in ED was negative.  Respiratory virus panel negative. Patient's respiratory status continued to worsen with hypoxia.  The pt improved markedly and was being prepared for d/c to a SNF when a repeat COVID test done for SNF placement was incidentally found to be positive. She was therefore refused by the SNF.  Significant Events: [facility] SARS-CoV-2 test for SNF placement positive  COVID-19 specific Treatment: None  Subjective: The patient is simply awaiting SNF placement.  No acute issues today warranting a medical visit.  Assessment & Plan:  Multilobar COVID pneumonia (Present on Admission) - acute hypoxic respiratory failure Right-sided multilobar pneumonia confirmed via CT chest - repeat SARS-CoV-2 test 7/12 positive - saturating 95-97% on room air - f/u SARS-CoV-2 test now negative -patient medically stable for discharge to SNF when bed can be arranged  COPD Does not require chronic O2 - well compensated at present  HTN Blood pressure controlled  Atrial fibrillation with SSS status post pacemaker Continue beta-blocker - Coumadin dosing per pharmacy -rate controlled  Chronic diastolic CHF On chronic as needed Lasix - no significant volume overload presently  Macrocytic anemia - B12 deficiency Likely pernicious anemia related to advanced age - added subcutaneous replacement x3 days to assure stores replenished - oral supplementation may not be sufficient - will need to be followed up as outpatient  Deconditioning  Awaiting SNF placement  DVT prophylaxis: Warfarin Code Status: DNR - NO CODE  Family Communication:  Disposition Plan: Awaiting SNF placement -SNF requiring negative COVID test prior to acceptance - test was NEGATIVE 7/19  Consultants:  none  Antimicrobials:  None  Objective: Blood pressure 127/64, pulse 63, temperature 97.8 F (36.6 C), temperature source Oral, resp. rate 16, height 5\' 5"  (1.651 m), weight 65.3 kg, SpO2 94 %.  Intake/Output Summary (Last 24 hours) at 06/01/2019 0829 Last data filed at 05/31/2019 1300 Gross per 24 hour  Intake 660 ml  Output -  Net 660 ml   Filed Weights   05/26/19 0529 05/27/19 0635 05/28/19 0500  Weight: 66 kg 66.5 kg 65.3 kg    Examination: No exam indicated today   CBC: Recent Labs  Lab 05/26/19 0545 05/29/19 0230 06/01/19 0345  WBC 6.1 8.1 7.8  HGB 11.4* 12.1 11.4*  HCT 37.0 40.0 37.7  MCV 103.6* 104.7* 104.1*  PLT 254 361 318   Basic Metabolic Panel: No results for input(s): NA, K, CL, CO2, GLUCOSE, BUN, CREATININE, CALCIUM, MG, PHOS in the last 168 hours. GFR: Estimated Creatinine Clearance: 38.7 mL/min (by C-G formula based on SCr of 0.59 mg/dL).  Liver Function Tests: No results for input(s): AST, ALT, ALKPHOS, BILITOT, PROT, ALBUMIN in the last 168 hours.  Coagulation Profile: Recent Labs  Lab 05/28/19 0230 05/29/19 0230 05/30/19 0330 05/31/19 0336 06/01/19 0345  INR 1.7* 1.7* 2.1* 2.3* 2.3*    Recent Results (from the past 240 hour(s))  Novel Coronavirus, NAA (hospital order; send-out to ref lab)     Status: Abnormal   Collection Time: 05/24/19  1:28 PM   Specimen: Nasopharyngeal Swab; Respiratory  Result Value  Ref Range Status   SARS-CoV-2, NAA DETECTED (A) NOT DETECTED Final    Comment: RESULT CALLED TO, READ BACK BY AND VERIFIED WITH: MARRISA LONG @1507  05/26/2019 C VARNER (NOTE)                  Client Requested Flag This test was developed and its performance characteristics determined by  [employer] and [employer]. This test has not been FDA cleared or approved. This test has been authorized by FDA under an Emergency Use Authorization (EUA). This test is only authorized for the duration of time the declaration that circumstances exist justifying the authorization of the emergency use of in vitro diagnostic tests for detection of SARS-CoV-2 virus and/or diagnosis of COVID-19 infection under section 564(b)(1) of the Act, 21 U.S.C. 376EGB-1(D)(1), unless the authorization is terminated or revoked sooner. When diagnostic testing is negative, the possibility of a false negative result should be considered in the context of a patient's recent exposures and the presence of clinical signs and symptoms consistent with COVID-19. An individual without symptoms of C OVID-19 and who is not shedding SARS-CoV-2 virus would expect to have a negative (not detected) result in this assay. Performed At: [facility] [place], [place] [id number] Rush Farmer MD GG:[id number]    [place]  Final    Comment: Performed at [facility] [address], [place], [place] [address]  SARS Coronavirus 2 (CEPHEID- Performed in [place] hospital lab), Hosp Order     Status: None   Collection Time: 05/31/19  3:57 PM   Specimen: Nasopharyngeal Swab  Result Value Ref Range Status   SARS Coronavirus 2 NEGATIVE NEGATIVE Final    Comment: (NOTE) If result is NEGATIVE SARS-CoV-2 target nucleic acids are NOT DETECTED. The SARS-CoV-2 RNA is generally detectable in upper and lower  respiratory specimens during the acute phase of infection. The lowest  concentration of SARS-CoV-2 viral copies this assay can detect is 250  copies / mL. A negative result does not preclude SARS-CoV-2 infection  and should not be used as the sole basis for treatment or other  patient management decisions.  A negative result may occur with  improper specimen  collection / handling, submission of specimen other  than nasopharyngeal swab, presence of viral mutation(s) within the  areas targeted by this assay, and inadequate number of viral copies  (<250 copies / mL). A negative result must be combined with clinical  observations, patient history, and epidemiological information. If result is POSITIVE SARS-CoV-2 target nucleic acids are DETECTED. The SARS-CoV-2 RNA is generally detectable in upper and lower  respiratory specimens dur ing the acute phase of infection.  Positive  results are indicative of active infection with SARS-CoV-2.  Clinical  correlation with patient history and other diagnostic information is  necessary to determine patient infection status.  Positive results do  not rule out bacterial infection or co-infection with other viruses. If result is PRESUMPTIVE POSTIVE SARS-CoV-2 nucleic acids MAY BE PRESENT.   A presumptive positive result was obtained on the submitted specimen  and confirmed on repeat testing.  While 2019 novel coronavirus  (SARS-CoV-2) nucleic acids may be present in the submitted sample  additional confirmatory testing may be necessary for epidemiological  and / or clinical management purposes  to differentiate between  SARS-CoV-2 and other Sarbecovirus currently known to infect humans.  If clinically indicated additional testing with an alternate test  methodology [phone]) is advised. The SARS-CoV-2 RNA is generally  detectable in upper  and lower respiratory sp ecimens during the acute  phase of infection. The expected result is Negative. Fact Sheet for Patients:  BoilerBrush.com.cyhttps://www.fda.gov/media/136312/download Fact Sheet for Healthcare Providers: https://pope.com/https://www.fda.gov/media/136313/download This test is not yet approved or cleared by the [place] FDA and has been authorized for detection and/or diagnosis of SARS-CoV-2 by FDA under an Emergency Use Authorization (EUA).  This EUA will remain in effect  (meaning this test can be used) for the duration of the COVID-19 declaration under Section 564(b)(1) of the Act, 21 U.S.C. section 360bbb-3(b)(1), unless the authorization is terminated or revoked sooner. Performed at [facility], [address] [place], [place] [address]      Scheduled Meds: . lisinopril  40 mg Oral Daily  . metoprolol tartrate  25 mg Oral BID  . multivitamin with minerals  1 tablet Oral Daily  . senna-docusate  1 tablet Oral BID  . vitamin B-12  1,000 mcg Oral Daily  . Warfarin - Pharmacist Dosing Inpatient   Does not apply q1800     LOS: 13 days   Lonia BloodJeffrey T. Adi Doro, MD [facility] Office  [phone] Pager - Text Page per Amion  If 7PM-7AM, please contact night-coverage per Amion 06/01/2019, 8:29 AM

## 2019-06-01 NOTE — Progress Notes (Signed)
ANTICOAGULATION CONSULT NOTE  Pharmacy Consult for warfarin Indication: A-fib  No Known Allergies  Patient Measurements: Height: 5\' 5"  (165.1 cm) Weight: 143 lb 15.4 oz (65.3 kg) IBW/kg (Calculated) : 57  Vital Signs: Temp: 97.8 F (36.6 C) (07/20 0812) Temp Source: Oral (07/20 0812) BP: 127/64 (07/20 0812) Pulse Rate: 65 (07/20 0835)  Labs: Recent Labs    05/30/19 0330 05/31/19 0336 06/01/19 0345  HGB  --   --  11.4*  HCT  --   --  37.7  PLT  --   --  318  LABPROT 23.2* 24.6* 25.1*  INR 2.1* 2.3* 2.3*   Estimated Creatinine Clearance: 38.7 mL/min (by C-G formula based on SCr of 0.59 mg/dL).  Assessment: 33 yoF with PMH Afib on warfarin PTA, SA node dysfunction s/p pacemaker, COPD, admitted 7/7 for CAP. Pharmacy to continue warfarin while admitted.    Prior anticoagulation: warfarin 6 mg daily  INR therapeutic today (requiring higher doses than home dose)   Goal of Therapy: INR 2-3  Plan:  Repeat Warfarin 7.5 mg PO x 1 today   Daily INR  CBC at least q72h while on warfarin   Monitor for s/sx of bleeding   Thank you Sherlon Handing, PharmD, BCPS Clinical pharmacist 06/01/2019 2:30 PM

## 2019-06-01 NOTE — Progress Notes (Signed)
Attempted to contact son, Campbell Lerner, however no answer. VM left with this RN's direct number and instructions to call back if he would like an update for the evening.

## 2019-06-01 NOTE — Discharge Instructions (Addendum)

## 2019-06-02 DIAGNOSIS — M6281 Muscle weakness (generalized): Secondary | ICD-10-CM | POA: Diagnosis not present

## 2019-06-02 DIAGNOSIS — I10 Essential (primary) hypertension: Secondary | ICD-10-CM | POA: Diagnosis not present

## 2019-06-02 DIAGNOSIS — E538 Deficiency of other specified B group vitamins: Secondary | ICD-10-CM | POA: Diagnosis not present

## 2019-06-02 DIAGNOSIS — I5032 Chronic diastolic (congestive) heart failure: Secondary | ICD-10-CM | POA: Diagnosis not present

## 2019-06-02 DIAGNOSIS — B349 Viral infection, unspecified: Secondary | ICD-10-CM | POA: Diagnosis not present

## 2019-06-02 DIAGNOSIS — I480 Paroxysmal atrial fibrillation: Secondary | ICD-10-CM | POA: Diagnosis not present

## 2019-06-02 DIAGNOSIS — R5381 Other malaise: Secondary | ICD-10-CM | POA: Diagnosis not present

## 2019-06-02 DIAGNOSIS — R600 Localized edema: Secondary | ICD-10-CM | POA: Diagnosis not present

## 2019-06-02 DIAGNOSIS — R2681 Unsteadiness on feet: Secondary | ICD-10-CM | POA: Diagnosis not present

## 2019-06-02 DIAGNOSIS — J984 Other disorders of lung: Secondary | ICD-10-CM | POA: Diagnosis not present

## 2019-06-02 DIAGNOSIS — R918 Other nonspecific abnormal finding of lung field: Secondary | ICD-10-CM | POA: Diagnosis not present

## 2019-06-02 DIAGNOSIS — Z7401 Bed confinement status: Secondary | ICD-10-CM | POA: Diagnosis not present

## 2019-06-02 DIAGNOSIS — R278 Other lack of coordination: Secondary | ICD-10-CM | POA: Diagnosis not present

## 2019-06-02 DIAGNOSIS — M2550 Pain in unspecified joint: Secondary | ICD-10-CM | POA: Diagnosis not present

## 2019-06-02 DIAGNOSIS — J1289 Other viral pneumonia: Secondary | ICD-10-CM | POA: Diagnosis not present

## 2019-06-02 DIAGNOSIS — J9601 Acute respiratory failure with hypoxia: Secondary | ICD-10-CM | POA: Diagnosis not present

## 2019-06-02 DIAGNOSIS — I495 Sick sinus syndrome: Secondary | ICD-10-CM | POA: Diagnosis not present

## 2019-06-02 DIAGNOSIS — R262 Difficulty in walking, not elsewhere classified: Secondary | ICD-10-CM | POA: Diagnosis not present

## 2019-06-02 DIAGNOSIS — J189 Pneumonia, unspecified organism: Secondary | ICD-10-CM | POA: Diagnosis not present

## 2019-06-02 DIAGNOSIS — U071 COVID-19: Secondary | ICD-10-CM | POA: Diagnosis not present

## 2019-06-02 DIAGNOSIS — R509 Fever, unspecified: Secondary | ICD-10-CM | POA: Diagnosis not present

## 2019-06-02 DIAGNOSIS — J188 Other pneumonia, unspecified organism: Secondary | ICD-10-CM | POA: Diagnosis not present

## 2019-06-02 DIAGNOSIS — R52 Pain, unspecified: Secondary | ICD-10-CM | POA: Diagnosis not present

## 2019-06-02 DIAGNOSIS — J449 Chronic obstructive pulmonary disease, unspecified: Secondary | ICD-10-CM | POA: Diagnosis not present

## 2019-06-02 DIAGNOSIS — I5033 Acute on chronic diastolic (congestive) heart failure: Secondary | ICD-10-CM | POA: Diagnosis not present

## 2019-06-02 DIAGNOSIS — R41841 Cognitive communication deficit: Secondary | ICD-10-CM | POA: Diagnosis not present

## 2019-06-02 LAB — PROTIME-INR
INR: 2.3 — ABNORMAL HIGH (ref 0.8–1.2)
Prothrombin Time: 25.2 seconds — ABNORMAL HIGH (ref 11.4–15.2)

## 2019-06-02 MED ORDER — POLYETHYLENE GLYCOL 3350 17 G PO PACK: 17.0000 g | PACK | Freq: Every day | ORAL | 0 refills | Status: DC | PRN

## 2019-06-02 MED ORDER — CYANOCOBALAMIN 1000 MCG PO TABS: 1000.0000 ug | ORAL_TABLET | Freq: Every day | ORAL | Status: DC

## 2019-06-02 MED ORDER — SIMETHICONE 80 MG PO CHEW: 80.0000 mg | CHEWABLE_TABLET | Freq: Four times a day (QID) | ORAL | 0 refills | Status: DC | PRN

## 2019-06-02 MED ORDER — BISACODYL 10 MG RE SUPP: 10.0000 mg | Freq: Every day | RECTAL | 0 refills | Status: DC | PRN

## 2019-06-02 MED ORDER — WARFARIN SODIUM 7.5 MG PO TABS
7.5000 mg | ORAL_TABLET | Freq: Every day | ORAL | Status: AC
  Administered 2019-06-02: 18:00:00 7.5 mg via ORAL
  Filled 2019-06-02: qty 1

## 2019-06-02 MED ORDER — WARFARIN SODIUM 7.5 MG PO TABS: 7.5000 mg | ORAL_TABLET | Freq: Every day | ORAL | Status: DC

## 2019-06-02 MED ORDER — SENNOSIDES-DOCUSATE SODIUM 8.6-50 MG PO TABS: 1.0000 | ORAL_TABLET | Freq: Two times a day (BID) | ORAL | Status: DC

## 2019-06-02 NOTE — TOC Transition Note (Signed)
Transition of Care [facility]) - CM/SW Discharge Note   Patient Details  Name: MINDEE ROBLEDO MRN: [id number] Date of Birth: 05-26-1923  Transition of Care [facility]) CM/SW Contact:  Weston Anna, LCSW Phone Number: 06/02/2019, 2:49 PM   Clinical Narrative:     Patient set to discharge to [facility] today. Patient will be transported via [place]. CSW will notify patients family. RN notified.     Barriers to Discharge: Continued Medical Work up   Patient Goals and CMS Choice Patient states their goals for this hospitalization and ongoing recovery are:: Pt is agreeable to going to [employer] Compare Post Acute Care list provided to:: Patient Choice offered to / list presented to : Patient  Discharge Placement                       Discharge Plan and Services In-house Referral: Clinical Social Work Discharge Planning Services: NA Post Acute Care Choice: Skilled Nursing Facility          DME Arranged: N/A DME Agency: NA       HH Arranged: NA HH Agency: NA Date HH Agency Contacted: 05/21/19 Time [place]: 1117 Representative spoke with at [place]: [place] ([place]) Interventions     Readmission Risk Interventions No flowsheet data found.

## 2019-06-02 NOTE — Discharge Summary (Signed)
DISCHARGE SUMMARY  [name]  MR#: [id number]  DOB:[birth year]  Date of Admission: 05/19/2019 Date of Discharge: 06/02/2019  Attending Physician:Labrandon Knoch Silvestre Gunner Michaeal Davis, MD  Patient's WUJ:[id number]:Hawkins, Ramon DredgeEdward, MD  [facility]  Disposition: D/C to SNF for reahab stay   Follow-up Appts: Follow-up Information    [facility], [facility] Follow up.   Specialty: Home Health Services Contact information: [address] [place] [address] [phone]        [name], Edward, MD Follow up in 1 week(s).   Specialty: Pulmonary Disease Why: for hospital discharge follow up for pneurmonia  Contact information: [address] [place] [address] [phone]        Antoine PocheBranch, Jonathan F, MD .   Specialty: Cardiology Contact information: [address] [place] [address] [phone]           Tests Needing Follow-up: -assess B12 level in 3-4 weeks - pt may require monthly injection for replacement -monitor INR on coumadin tx routinely   Discharge Diagnoses: Multilobar COVID pneumonia (Present on Admission) Acute hypoxic respiratory failure COPD HTN Atrial fibrillation with SSS status post pacemaker Chronic diastolic CHF Macrocytic anemia - B12 deficiency Deconditioning NO CODE BLUE - DNR   Initial presentation: 85 year old with a history of arthritis, hip fracture, endometrial carcinoma, sinoatrial node dysfunction, pacemaker, atrial fibrillation on Coumadin, COPD, and HTN who presented from home with nausea, vomiting, chills, and shortness of breath which began the day of her admit.   Initial COVID-19 test in ED was negative. Respiratory virus panel negative. Patient's respiratory status continued to worsen with hypoxia. The pt improved markedly and was being prepared for d/c to a SNF when a repeat COVID test done for SNF placement was incidentally found to be positive. She was therefore refused by the SNF.  Significant Events:  [facility] SARS-CoV-2 test for SNF placement positive 7/21 d/c to SNF for rehab stay   Hospital Course:  Multilobar COVID pneumonia (Present on Admission) - acute hypoxic respiratory failure Right-sided multilobar pneumonia confirmed via CT chest - repeat SARS-CoV-2 test 7/12 positive - saturating 95-97% on room air - f/u SARS-CoV-2 test negative -patient medically stable for discharge to SNF  COPD Does not require chronic O2 - well compensated at present  HTN Blood pressure controlled  Atrial fibrillation with SSS status post pacemaker Continue beta-blocker - Coumadin dosing per pharmacy -rate controlled  Chronic diastolic CHF On chronic as needed Lasix - no significant volume overload presently  Macrocytic anemia - B12 deficiency Likely pernicious anemia related to advanced age - added subcutaneous replacement x3 days to assure stores replenished - oral supplementation may not be sufficient - will need to be followed up as outpatient  Deconditioning Awaiting SNF placement for rehab stay - her ultimate goal is to return home to live independently   Allergies as of 06/02/2019   No Known Allergies     Medication List    STOP taking these medications   albuterol 108 (90 Base) MCG/ACT inhaler Commonly known as: VENTOLIN HFA     TAKE these medications   bisacodyl 10 MG suppository Commonly known as: DULCOLAX Place 1 suppository (10 mg total) rectally daily as needed for moderate constipation.   cyanocobalamin 1000 MCG tablet Take 1 tablet (1,000 mcg total) by mouth daily. Start taking on: June 03, 2019   furosemide 40 MG tablet Commonly known as: LASIX Take 40 mg by mouth daily as needed for edema.   lisinopril 40 MG tablet  Commonly known as: ZESTRIL TAKE 1 TABLET BY MOUTH EVERY DAY   metoprolol tartrate 25 MG tablet Commonly known as: LOPRESSOR Take 1 tablet (25 mg total) by mouth 2 (two) times daily.   multivitamin tablet Take 1 tablet by  mouth daily.   polyethylene glycol 17 g packet Commonly known as: MIRALAX / GLYCOLAX Take 17 g by mouth daily as needed for mild constipation.   senna-docusate 8.6-50 MG tablet Commonly known as: Senokot-S Take 1 tablet by mouth 2 (two) times daily.   simethicone 80 MG chewable tablet Commonly known as: MYLICON Chew 1 tablet (80 mg total) by mouth 4 (four) times daily as needed for flatulence.   warfarin 7.5 MG tablet Commonly known as: COUMADIN Take as directed. If you are unsure how to take this medication, talk to your nurse or doctor. Original instructions: Take 1 tablet (7.5 mg total) by mouth daily at 6 PM. What changed:   medication strength  how much to take  how to take this  when to take this  additional instructions       Day of Discharge BP (!) 157/90 (BP Location: Left Arm)   Pulse 86   Temp 97.6 F (36.4 C) (Oral)   Resp 16   Ht 5\' 5"  (1.651 m)   Wt 65.3 kg   SpO2 94%   BMI 23.96 kg/m   Physical Exam: General: No acute respiratory distress Lungs: Clear to auscultation bilaterally without wheezes or crackles Cardiovascular: Regular rate and rhythm without murmur gallop or rub normal S1 and S2 Abdomen: Nontender, nondistended, soft, bowel sounds positive, no rebound, no ascites, no appreciable mass Extremities: No significant cyanosis, clubbing, or edema bilateral lower extremities  Coags: Recent Labs  Lab 05/29/19 0230 05/30/19 0330 05/31/19 0336 06/01/19 0345 06/02/19 0500  INR 1.7* 2.1* 2.3* 2.3* 2.3*   CBC: Recent Labs  Lab 05/29/19 0230 06/01/19 0345  WBC 8.1 7.8  HGB 12.1 11.4*  HCT 40.0 37.7  MCV 104.7* 104.1*  PLT 361 318    Recent Results (from the past 240 hour(s))  Novel Coronavirus, NAA (hospital order; send-out to ref lab)     Status: Abnormal   Collection Time: 05/24/19  1:28 PM   Specimen: Nasopharyngeal Swab; Respiratory  Result Value Ref Range Status   SARS-CoV-2, NAA DETECTED (A) NOT DETECTED Final     Comment: RESULT CALLED TO, READ BACK BY AND VERIFIED WITH: MARRISA LONG @1507  05/26/2019 C VARNER (NOTE)                  Client Requested Flag This test was developed and its performance characteristics determined by [employer] and [employer]. This test has not been FDA cleared or approved. This test has been authorized by FDA under an Emergency Use Authorization (EUA). This test is only authorized for the duration of time the declaration that circumstances exist justifying the authorization of the emergency use of in vitro diagnostic tests for detection of SARS-CoV-2 virus and/or diagnosis of COVID-19 infection under section 564(b)(1) of the Act, 21 U.S.C. 315VVO-1(Y)(0), unless the authorization is terminated or revoked sooner. When diagnostic testing is negative, the possibility of a false negative result should be considered in the context of a patient's recent exposures and the presence of clinical signs and symptoms consistent with COVID-19. An individual without symptoms of C OVID-19 and who is not shedding SARS-CoV-2 virus would expect to have a negative (not detected) result in this assay. Performed At: [facility] [address] [place], [place] [id number] Perlie Gold  Claudie FishermanSanjai MD ZO:[id number]:[phone]    Coronavirus Source NASOPHARYNGEAL  Final    Comment: Performed at [facility], [address] [place], [place] [address]  SARS Coronavirus 2 (CEPHEID- Performed in [facility] hospital lab), Hosp Order     Status: None   Collection Time: 05/31/19  3:57 PM   Specimen: Nasopharyngeal Swab  Result Value Ref Range Status   SARS Coronavirus 2 NEGATIVE NEGATIVE Final    Comment: (NOTE) If result is NEGATIVE SARS-CoV-2 target nucleic acids are NOT DETECTED. The SARS-CoV-2 RNA is generally detectable in upper and lower  respiratory specimens during the acute phase of infection. The lowest  concentration of SARS-CoV-2 viral copies this assay can detect is 250  copies  / mL. A negative result does not preclude SARS-CoV-2 infection  and should not be used as the sole basis for treatment or other  patient management decisions.  A negative result may occur with  improper specimen collection / handling, submission of specimen other  than nasopharyngeal swab, presence of viral mutation(s) within the  areas targeted by this assay, and inadequate number of viral copies  (<250 copies / mL). A negative result must be combined with clinical  observations, patient history, and epidemiological information. If result is POSITIVE SARS-CoV-2 target nucleic acids are DETECTED. The SARS-CoV-2 RNA is generally detectable in upper and lower  respiratory specimens dur ing the acute phase of infection.  Positive  results are indicative of active infection with SARS-CoV-2.  Clinical  correlation with patient history and other diagnostic information is  necessary to determine patient infection status.  Positive results do  not rule out bacterial infection or co-infection with other viruses. If result is PRESUMPTIVE POSTIVE SARS-CoV-2 nucleic acids MAY BE PRESENT.   A presumptive positive result was obtained on the submitted specimen  and confirmed on repeat testing.  While 2019 novel coronavirus  (SARS-CoV-2) nucleic acids may be present in the submitted sample  additional confirmatory testing may be necessary for epidemiological  and / or clinical management purposes  to differentiate between  SARS-CoV-2 and other Sarbecovirus currently known to infect humans.  If clinically indicated additional testing with an alternate test  methodology [phone] is advised. The SARS-CoV-2 RNA is generally  detectable in upper and lower respiratory sp ecimens during the acute  phase of infection. The expected result is Negative. Fact Sheet for Patients:  BoilerBrush.com.cyhttps://www.fda.gov/media/136312/download Fact Sheet for Healthcare Providers: https://pope.com/https://www.fda.gov/media/136313/download This test  is not yet approved or cleared by the [place] FDA and has been authorized for detection and/or diagnosis of SARS-CoV-2 by FDA under an Emergency Use Authorization (EUA).  This EUA will remain in effect (meaning this test can be used) for the duration of the COVID-19 declaration under Section 564(b)(1) of the Act, 21 U.S.C. section 360bbb-3(b)(1), unless the authorization is terminated or revoked sooner. Performed at [facility], [address] [place], [place] [address]     Time spent in discharge (includes decision making & examination of pt): 30 minutes  06/02/2019, 1:33 PM   Lonia BloodJeffrey T. Chyla Schlender, MD [facility] Office  [phone]

## 2019-06-02 NOTE — Progress Notes (Signed)
Report called to Insurance risk surveyor at [facility].

## 2019-06-02 NOTE — Progress Notes (Signed)
Update son [name] over the phone about the plan for discharge today.

## 2019-06-02 NOTE — Progress Notes (Signed)
ANTICOAGULATION CONSULT NOTE  Pharmacy Consult for warfarin Indication: A-fib  No Known Allergies  Patient Measurements: Height: 5\' 5"  (165.1 cm) Weight: 143 lb 15.4 oz (65.3 kg) IBW/kg (Calculated) : 57  Vital Signs: Temp: 97.6 F (36.4 C) (07/21 0822) Temp Source: Oral (07/21 0822) BP: 157/90 (07/21 0822) Pulse Rate: 86 (07/21 0456)  Labs: Recent Labs    05/31/19 0336 06/01/19 0345 06/02/19 0500  HGB  --  11.4*  --   HCT  --  37.7  --   PLT  --  318  --   LABPROT 24.6* 25.1* 25.2*  INR 2.3* 2.3* 2.3*   Estimated Creatinine Clearance: 38.7 mL/min (by C-G formula based on SCr of 0.59 mg/dL).  Assessment: 78 yoF with PMH Afib on warfarin PTA, SA node dysfunction s/p pacemaker, COPD, admitted 7/7 for CAP. Pharmacy to continue warfarin while admitted.    Prior anticoagulation: warfarin 6 mg daily  INR therapeutic today (requiring higher doses than home dose)   Goal of Therapy: INR 2-3  Plan:  Warfarin 7.5 mg daily  INR q 72h  CBC at least q72h while on warfarin   Monitor for s/sx of bleeding   Thank you Ulice Dash, PharmD, BCPS Clinical Pharmacist  06/02/2019 12:56 PM

## 2019-06-03 ENCOUNTER — Other Ambulatory Visit: Payer: Self-pay

## 2019-06-03 ENCOUNTER — Other Ambulatory Visit: Payer: Self-pay | Admitting: *Deleted

## 2019-06-03 DIAGNOSIS — I5032 Chronic diastolic (congestive) heart failure: Secondary | ICD-10-CM | POA: Diagnosis not present

## 2019-06-03 DIAGNOSIS — U071 COVID-19: Secondary | ICD-10-CM | POA: Diagnosis not present

## 2019-06-03 DIAGNOSIS — I10 Essential (primary) hypertension: Secondary | ICD-10-CM | POA: Diagnosis not present

## 2019-06-03 DIAGNOSIS — J188 Other pneumonia, unspecified organism: Secondary | ICD-10-CM | POA: Diagnosis not present

## 2019-06-03 DIAGNOSIS — I495 Sick sinus syndrome: Secondary | ICD-10-CM | POA: Diagnosis not present

## 2019-06-03 DIAGNOSIS — E538 Deficiency of other specified B group vitamins: Secondary | ICD-10-CM | POA: Diagnosis not present

## 2019-06-03 DIAGNOSIS — J984 Other disorders of lung: Secondary | ICD-10-CM | POA: Diagnosis not present

## 2019-06-03 NOTE — Patient Outreach (Signed)
Received notification of member's admit to [facility]. However, Probation officer is only following [employer] members at [facility].  Notification sent to [place] to make aware writer will not be following member at [facility].  Writer signing off.  Marthenia Rolling, MSN-Ed, RN,BSN [place] Acute Care Coordinator [phone] [facility]) [phone]  (Toll free office)

## 2019-06-03 NOTE — Patient Outreach (Signed)
[place] [facility]) Care Management  06/03/2019  Brownie Gockel Mcilvain [birth year] [id number]   Referral Date: 06/03/2019 Referral Source:  [employer] Report Date of Admission: 05/19/2019 Diagnosis:  Pneumonia Date of Discharge: 06/02/2019 Facility:  [place]:  [facility]  Patient discharge to [facility] for rehab.    Plan: RN CM will notify Post Acute Case Manager of admit to facility.  RN CM will sign off     Jone Baseman, RN, MSN [place] Management Care Management Coordinator Direct Line [phone] Toll Free: [phone]  Fax: [phone]

## 2019-06-04 DIAGNOSIS — J984 Other disorders of lung: Secondary | ICD-10-CM | POA: Diagnosis not present

## 2019-06-04 DIAGNOSIS — E538 Deficiency of other specified B group vitamins: Secondary | ICD-10-CM | POA: Diagnosis not present

## 2019-06-04 DIAGNOSIS — R918 Other nonspecific abnormal finding of lung field: Secondary | ICD-10-CM | POA: Diagnosis not present

## 2019-06-04 DIAGNOSIS — I480 Paroxysmal atrial fibrillation: Secondary | ICD-10-CM | POA: Diagnosis not present

## 2019-06-04 DIAGNOSIS — R5381 Other malaise: Secondary | ICD-10-CM | POA: Diagnosis not present

## 2019-06-04 DIAGNOSIS — U071 COVID-19: Secondary | ICD-10-CM | POA: Diagnosis not present

## 2019-06-04 DIAGNOSIS — I495 Sick sinus syndrome: Secondary | ICD-10-CM | POA: Diagnosis not present

## 2019-06-04 DIAGNOSIS — I10 Essential (primary) hypertension: Secondary | ICD-10-CM | POA: Diagnosis not present

## 2019-06-08 ENCOUNTER — Encounter: Payer: Medicare HMO | Admitting: *Deleted

## 2019-06-09 ENCOUNTER — Telehealth: Payer: Self-pay

## 2019-06-09 NOTE — Telephone Encounter (Signed)
Left message for patient to remind of missed remote transmission.  

## 2019-06-11 DIAGNOSIS — J984 Other disorders of lung: Secondary | ICD-10-CM | POA: Diagnosis not present

## 2019-06-11 DIAGNOSIS — I480 Paroxysmal atrial fibrillation: Secondary | ICD-10-CM | POA: Diagnosis not present

## 2019-06-11 DIAGNOSIS — I5032 Chronic diastolic (congestive) heart failure: Secondary | ICD-10-CM | POA: Diagnosis not present

## 2019-06-11 DIAGNOSIS — U071 COVID-19: Secondary | ICD-10-CM | POA: Diagnosis not present

## 2019-06-11 DIAGNOSIS — I495 Sick sinus syndrome: Secondary | ICD-10-CM | POA: Diagnosis not present

## 2019-06-11 DIAGNOSIS — I10 Essential (primary) hypertension: Secondary | ICD-10-CM | POA: Diagnosis not present

## 2019-06-11 DIAGNOSIS — J188 Other pneumonia, unspecified organism: Secondary | ICD-10-CM | POA: Diagnosis not present

## 2019-06-13 DIAGNOSIS — R600 Localized edema: Secondary | ICD-10-CM | POA: Diagnosis not present

## 2019-06-13 DIAGNOSIS — J189 Pneumonia, unspecified organism: Secondary | ICD-10-CM | POA: Diagnosis not present

## 2019-06-13 DIAGNOSIS — J984 Other disorders of lung: Secondary | ICD-10-CM | POA: Diagnosis not present

## 2019-06-13 DIAGNOSIS — B349 Viral infection, unspecified: Secondary | ICD-10-CM | POA: Diagnosis not present

## 2019-06-13 DIAGNOSIS — J9601 Acute respiratory failure with hypoxia: Secondary | ICD-10-CM | POA: Diagnosis not present

## 2019-06-13 DIAGNOSIS — U071 COVID-19: Secondary | ICD-10-CM | POA: Diagnosis not present

## 2019-06-13 DIAGNOSIS — R262 Difficulty in walking, not elsewhere classified: Secondary | ICD-10-CM | POA: Diagnosis not present

## 2019-06-13 DIAGNOSIS — I10 Essential (primary) hypertension: Secondary | ICD-10-CM | POA: Diagnosis not present

## 2019-06-13 DIAGNOSIS — M6281 Muscle weakness (generalized): Secondary | ICD-10-CM | POA: Diagnosis not present

## 2019-06-13 DIAGNOSIS — R278 Other lack of coordination: Secondary | ICD-10-CM | POA: Diagnosis not present

## 2019-06-13 DIAGNOSIS — R41841 Cognitive communication deficit: Secondary | ICD-10-CM | POA: Diagnosis not present

## 2019-06-13 DIAGNOSIS — I480 Paroxysmal atrial fibrillation: Secondary | ICD-10-CM | POA: Diagnosis not present

## 2019-06-13 DIAGNOSIS — J449 Chronic obstructive pulmonary disease, unspecified: Secondary | ICD-10-CM | POA: Diagnosis not present

## 2019-06-13 DIAGNOSIS — E538 Deficiency of other specified B group vitamins: Secondary | ICD-10-CM | POA: Diagnosis not present

## 2019-06-13 DIAGNOSIS — R2681 Unsteadiness on feet: Secondary | ICD-10-CM | POA: Diagnosis not present

## 2019-06-13 DIAGNOSIS — I5033 Acute on chronic diastolic (congestive) heart failure: Secondary | ICD-10-CM | POA: Diagnosis not present

## 2019-06-13 DIAGNOSIS — J188 Other pneumonia, unspecified organism: Secondary | ICD-10-CM | POA: Diagnosis not present

## 2019-06-13 DIAGNOSIS — I5032 Chronic diastolic (congestive) heart failure: Secondary | ICD-10-CM | POA: Diagnosis not present

## 2019-06-15 DIAGNOSIS — R600 Localized edema: Secondary | ICD-10-CM | POA: Diagnosis not present

## 2019-06-15 DIAGNOSIS — I5032 Chronic diastolic (congestive) heart failure: Secondary | ICD-10-CM | POA: Diagnosis not present

## 2019-06-15 DIAGNOSIS — J188 Other pneumonia, unspecified organism: Secondary | ICD-10-CM | POA: Diagnosis not present

## 2019-06-15 DIAGNOSIS — I10 Essential (primary) hypertension: Secondary | ICD-10-CM | POA: Diagnosis not present

## 2019-06-15 DIAGNOSIS — U071 COVID-19: Secondary | ICD-10-CM | POA: Diagnosis not present

## 2019-06-15 DIAGNOSIS — J984 Other disorders of lung: Secondary | ICD-10-CM | POA: Diagnosis not present

## 2019-06-18 DIAGNOSIS — I5032 Chronic diastolic (congestive) heart failure: Secondary | ICD-10-CM | POA: Diagnosis not present

## 2019-06-18 DIAGNOSIS — I480 Paroxysmal atrial fibrillation: Secondary | ICD-10-CM | POA: Diagnosis not present

## 2019-06-18 DIAGNOSIS — E538 Deficiency of other specified B group vitamins: Secondary | ICD-10-CM | POA: Diagnosis not present

## 2019-06-19 DIAGNOSIS — I10 Essential (primary) hypertension: Secondary | ICD-10-CM | POA: Diagnosis not present

## 2019-06-19 DIAGNOSIS — I480 Paroxysmal atrial fibrillation: Secondary | ICD-10-CM | POA: Diagnosis not present

## 2019-06-19 DIAGNOSIS — J188 Other pneumonia, unspecified organism: Secondary | ICD-10-CM | POA: Diagnosis not present

## 2019-06-19 DIAGNOSIS — E538 Deficiency of other specified B group vitamins: Secondary | ICD-10-CM | POA: Diagnosis not present

## 2019-06-19 DIAGNOSIS — J984 Other disorders of lung: Secondary | ICD-10-CM | POA: Diagnosis not present

## 2019-06-19 DIAGNOSIS — I5032 Chronic diastolic (congestive) heart failure: Secondary | ICD-10-CM | POA: Diagnosis not present

## 2019-06-22 ENCOUNTER — Other Ambulatory Visit: Payer: Self-pay

## 2019-06-22 ENCOUNTER — Ambulatory Visit (INDEPENDENT_AMBULATORY_CARE_PROVIDER_SITE_OTHER): Payer: Medicare HMO | Admitting: *Deleted

## 2019-06-22 DIAGNOSIS — R55 Syncope and collapse: Secondary | ICD-10-CM

## 2019-06-22 DIAGNOSIS — I495 Sick sinus syndrome: Secondary | ICD-10-CM | POA: Diagnosis not present

## 2019-06-22 LAB — CUP PACEART REMOTE DEVICE CHECK
Battery Remaining Longevity: 122 mo
Battery Remaining Percentage: 95.5 %
Battery Voltage: 3.02 V
Brady Statistic RV Percent Paced: 89 %
Date Time Interrogation Session: 20200810013834
Implantable Lead Implant Date: 20070518
Implantable Lead Implant Date: 20070518
Implantable Lead Location: 753859
Implantable Lead Location: 753860
Implantable Pulse Generator Implant Date: 20191003
Lead Channel Impedance Value: 590 Ohm
Lead Channel Pacing Threshold Amplitude: 0.75 V
Lead Channel Pacing Threshold Pulse Width: 0.5 ms
Lead Channel Sensing Intrinsic Amplitude: 12 mV
Lead Channel Setting Pacing Amplitude: 2.5 V
Lead Channel Setting Pacing Pulse Width: 0.5 ms
Lead Channel Setting Sensing Sensitivity: 2 mV
Pulse Gen Model: 1272
Pulse Gen Serial Number: 9067608

## 2019-06-22 NOTE — Patient Outreach (Signed)
[place] [facility]) Care Management  06/22/2019  [name] 07-16-1923 [id number]     Transition of Care Referral  Referral Date: 06/22/2019 Referral Source: [employer] Discharge Report Date of Admission: 06/02/2019 Diagnosis: " PNA" Date of Discharge: 06/21/2019 Facility: [place]: [facility]    Outreach attempt #1 to patient. Spoke with patient. She denies any acute issues or concerns at present. She voices that she is glad to be home and is currently doing laundry. She shares that she has very supportive stepdaughter and stepson who are doing a great job of helping her out. She reports that she has all her meds in the home. No issues or concerns regarding them but did not wish to review them by phone. She states that she has to call and get her stepdaughter to help her make MD appts. She relies on family to take her to appts. She denies any RN CM needs or concerns at this time. She was very appreciative of follow up call but voices no THN needs or concerns at this time.    Plan: RN CM will close case as no further interventions needed at this time.    Enzo Montgomery, RN,BSN,CCM [place] Management Telephonic Care Management Coordinator Direct Phone: [phone] Toll Free: [phone] Fax: [phone]

## 2019-06-24 DIAGNOSIS — U071 COVID-19: Secondary | ICD-10-CM | POA: Diagnosis not present

## 2019-06-24 DIAGNOSIS — Z8701 Personal history of pneumonia (recurrent): Secondary | ICD-10-CM | POA: Diagnosis not present

## 2019-06-24 DIAGNOSIS — I495 Sick sinus syndrome: Secondary | ICD-10-CM | POA: Diagnosis not present

## 2019-06-24 DIAGNOSIS — I4891 Unspecified atrial fibrillation: Secondary | ICD-10-CM | POA: Diagnosis not present

## 2019-06-24 DIAGNOSIS — I5033 Acute on chronic diastolic (congestive) heart failure: Secondary | ICD-10-CM | POA: Diagnosis not present

## 2019-06-24 DIAGNOSIS — J449 Chronic obstructive pulmonary disease, unspecified: Secondary | ICD-10-CM | POA: Diagnosis not present

## 2019-06-24 DIAGNOSIS — R41841 Cognitive communication deficit: Secondary | ICD-10-CM | POA: Diagnosis not present

## 2019-06-24 DIAGNOSIS — I110 Hypertensive heart disease with heart failure: Secondary | ICD-10-CM | POA: Diagnosis not present

## 2019-06-24 DIAGNOSIS — M1990 Unspecified osteoarthritis, unspecified site: Secondary | ICD-10-CM | POA: Diagnosis not present

## 2019-06-26 DIAGNOSIS — R41841 Cognitive communication deficit: Secondary | ICD-10-CM | POA: Diagnosis not present

## 2019-06-26 DIAGNOSIS — I4891 Unspecified atrial fibrillation: Secondary | ICD-10-CM | POA: Diagnosis not present

## 2019-06-26 DIAGNOSIS — J449 Chronic obstructive pulmonary disease, unspecified: Secondary | ICD-10-CM | POA: Diagnosis not present

## 2019-06-26 DIAGNOSIS — Z8701 Personal history of pneumonia (recurrent): Secondary | ICD-10-CM | POA: Diagnosis not present

## 2019-06-26 DIAGNOSIS — M1990 Unspecified osteoarthritis, unspecified site: Secondary | ICD-10-CM | POA: Diagnosis not present

## 2019-06-26 DIAGNOSIS — I5033 Acute on chronic diastolic (congestive) heart failure: Secondary | ICD-10-CM | POA: Diagnosis not present

## 2019-06-26 DIAGNOSIS — I495 Sick sinus syndrome: Secondary | ICD-10-CM | POA: Diagnosis not present

## 2019-06-26 DIAGNOSIS — I110 Hypertensive heart disease with heart failure: Secondary | ICD-10-CM | POA: Diagnosis not present

## 2019-06-26 DIAGNOSIS — U071 COVID-19: Secondary | ICD-10-CM | POA: Diagnosis not present

## 2019-06-29 NOTE — Progress Notes (Signed)
Remote pacemaker transmission.   

## 2019-07-01 DIAGNOSIS — U071 COVID-19: Secondary | ICD-10-CM | POA: Diagnosis not present

## 2019-07-01 DIAGNOSIS — M1990 Unspecified osteoarthritis, unspecified site: Secondary | ICD-10-CM | POA: Diagnosis not present

## 2019-07-01 DIAGNOSIS — I495 Sick sinus syndrome: Secondary | ICD-10-CM | POA: Diagnosis not present

## 2019-07-01 DIAGNOSIS — I110 Hypertensive heart disease with heart failure: Secondary | ICD-10-CM | POA: Diagnosis not present

## 2019-07-01 DIAGNOSIS — I5033 Acute on chronic diastolic (congestive) heart failure: Secondary | ICD-10-CM | POA: Diagnosis not present

## 2019-07-01 DIAGNOSIS — I4891 Unspecified atrial fibrillation: Secondary | ICD-10-CM | POA: Diagnosis not present

## 2019-07-01 DIAGNOSIS — R41841 Cognitive communication deficit: Secondary | ICD-10-CM | POA: Diagnosis not present

## 2019-07-01 DIAGNOSIS — J449 Chronic obstructive pulmonary disease, unspecified: Secondary | ICD-10-CM | POA: Diagnosis not present

## 2019-07-01 DIAGNOSIS — Z8701 Personal history of pneumonia (recurrent): Secondary | ICD-10-CM | POA: Diagnosis not present

## 2019-07-02 ENCOUNTER — Other Ambulatory Visit: Payer: Self-pay

## 2019-07-02 ENCOUNTER — Other Ambulatory Visit (HOSPITAL_COMMUNITY): Payer: Self-pay | Admitting: Pulmonary Disease

## 2019-07-02 ENCOUNTER — Ambulatory Visit (HOSPITAL_COMMUNITY)
Admission: RE | Admit: 2019-07-02 | Discharge: 2019-07-02 | Disposition: A | Discharge status: Home / Self Care | Payer: Medicare HMO | Source: Ambulatory Visit | Attending: Pulmonary Disease | Admitting: Pulmonary Disease

## 2019-07-02 DIAGNOSIS — I5032 Chronic diastolic (congestive) heart failure: Secondary | ICD-10-CM | POA: Diagnosis not present

## 2019-07-02 DIAGNOSIS — I4891 Unspecified atrial fibrillation: Secondary | ICD-10-CM | POA: Diagnosis not present

## 2019-07-02 DIAGNOSIS — J189 Pneumonia, unspecified organism: Secondary | ICD-10-CM | POA: Insufficient documentation

## 2019-07-02 DIAGNOSIS — R0602 Shortness of breath: Secondary | ICD-10-CM | POA: Diagnosis not present

## 2019-07-02 DIAGNOSIS — J449 Chronic obstructive pulmonary disease, unspecified: Secondary | ICD-10-CM | POA: Diagnosis not present

## 2019-07-03 DIAGNOSIS — R41841 Cognitive communication deficit: Secondary | ICD-10-CM | POA: Diagnosis not present

## 2019-07-03 DIAGNOSIS — Z8701 Personal history of pneumonia (recurrent): Secondary | ICD-10-CM | POA: Diagnosis not present

## 2019-07-03 DIAGNOSIS — I4891 Unspecified atrial fibrillation: Secondary | ICD-10-CM | POA: Diagnosis not present

## 2019-07-03 DIAGNOSIS — I495 Sick sinus syndrome: Secondary | ICD-10-CM | POA: Diagnosis not present

## 2019-07-03 DIAGNOSIS — M1990 Unspecified osteoarthritis, unspecified site: Secondary | ICD-10-CM | POA: Diagnosis not present

## 2019-07-03 DIAGNOSIS — I110 Hypertensive heart disease with heart failure: Secondary | ICD-10-CM | POA: Diagnosis not present

## 2019-07-03 DIAGNOSIS — I5033 Acute on chronic diastolic (congestive) heart failure: Secondary | ICD-10-CM | POA: Diagnosis not present

## 2019-07-03 DIAGNOSIS — J449 Chronic obstructive pulmonary disease, unspecified: Secondary | ICD-10-CM | POA: Diagnosis not present

## 2019-07-03 DIAGNOSIS — U071 COVID-19: Secondary | ICD-10-CM | POA: Diagnosis not present

## 2019-07-08 DIAGNOSIS — I5033 Acute on chronic diastolic (congestive) heart failure: Secondary | ICD-10-CM | POA: Diagnosis not present

## 2019-07-08 DIAGNOSIS — R41841 Cognitive communication deficit: Secondary | ICD-10-CM | POA: Diagnosis not present

## 2019-07-08 DIAGNOSIS — U071 COVID-19: Secondary | ICD-10-CM | POA: Diagnosis not present

## 2019-07-08 DIAGNOSIS — Z8701 Personal history of pneumonia (recurrent): Secondary | ICD-10-CM | POA: Diagnosis not present

## 2019-07-08 DIAGNOSIS — J449 Chronic obstructive pulmonary disease, unspecified: Secondary | ICD-10-CM | POA: Diagnosis not present

## 2019-07-08 DIAGNOSIS — I4891 Unspecified atrial fibrillation: Secondary | ICD-10-CM | POA: Diagnosis not present

## 2019-07-08 DIAGNOSIS — I110 Hypertensive heart disease with heart failure: Secondary | ICD-10-CM | POA: Diagnosis not present

## 2019-07-08 DIAGNOSIS — I495 Sick sinus syndrome: Secondary | ICD-10-CM | POA: Diagnosis not present

## 2019-07-08 DIAGNOSIS — M1990 Unspecified osteoarthritis, unspecified site: Secondary | ICD-10-CM | POA: Diagnosis not present

## 2019-07-10 DIAGNOSIS — M1990 Unspecified osteoarthritis, unspecified site: Secondary | ICD-10-CM | POA: Diagnosis not present

## 2019-07-10 DIAGNOSIS — Z8701 Personal history of pneumonia (recurrent): Secondary | ICD-10-CM | POA: Diagnosis not present

## 2019-07-10 DIAGNOSIS — I5033 Acute on chronic diastolic (congestive) heart failure: Secondary | ICD-10-CM | POA: Diagnosis not present

## 2019-07-10 DIAGNOSIS — J449 Chronic obstructive pulmonary disease, unspecified: Secondary | ICD-10-CM | POA: Diagnosis not present

## 2019-07-10 DIAGNOSIS — I495 Sick sinus syndrome: Secondary | ICD-10-CM | POA: Diagnosis not present

## 2019-07-10 DIAGNOSIS — I110 Hypertensive heart disease with heart failure: Secondary | ICD-10-CM | POA: Diagnosis not present

## 2019-07-10 DIAGNOSIS — I4891 Unspecified atrial fibrillation: Secondary | ICD-10-CM | POA: Diagnosis not present

## 2019-07-10 DIAGNOSIS — R41841 Cognitive communication deficit: Secondary | ICD-10-CM | POA: Diagnosis not present

## 2019-07-10 DIAGNOSIS — U071 COVID-19: Secondary | ICD-10-CM | POA: Diagnosis not present

## 2019-07-13 ENCOUNTER — Other Ambulatory Visit: Payer: Self-pay

## 2019-07-13 ENCOUNTER — Ambulatory Visit (INDEPENDENT_AMBULATORY_CARE_PROVIDER_SITE_OTHER): Payer: Medicare HMO | Admitting: *Deleted

## 2019-07-13 DIAGNOSIS — Z5181 Encounter for therapeutic drug level monitoring: Secondary | ICD-10-CM

## 2019-07-13 DIAGNOSIS — I4891 Unspecified atrial fibrillation: Secondary | ICD-10-CM | POA: Diagnosis not present

## 2019-07-13 LAB — POCT INR: INR: 3.7 — AB (ref 2.0–3.0)

## 2019-07-13 NOTE — Patient Instructions (Signed)
Hold coumadin tonight then decrease dose to 1 1/2 tablets daily except 1 tablet on Mondays and Thursdays Recheck in 4 weeks

## 2019-07-14 DIAGNOSIS — R41841 Cognitive communication deficit: Secondary | ICD-10-CM | POA: Diagnosis not present

## 2019-07-14 DIAGNOSIS — Z8701 Personal history of pneumonia (recurrent): Secondary | ICD-10-CM | POA: Diagnosis not present

## 2019-07-14 DIAGNOSIS — M1990 Unspecified osteoarthritis, unspecified site: Secondary | ICD-10-CM | POA: Diagnosis not present

## 2019-07-14 DIAGNOSIS — I495 Sick sinus syndrome: Secondary | ICD-10-CM | POA: Diagnosis not present

## 2019-07-14 DIAGNOSIS — U071 COVID-19: Secondary | ICD-10-CM | POA: Diagnosis not present

## 2019-07-14 DIAGNOSIS — I110 Hypertensive heart disease with heart failure: Secondary | ICD-10-CM | POA: Diagnosis not present

## 2019-07-14 DIAGNOSIS — J449 Chronic obstructive pulmonary disease, unspecified: Secondary | ICD-10-CM | POA: Diagnosis not present

## 2019-07-14 DIAGNOSIS — I4891 Unspecified atrial fibrillation: Secondary | ICD-10-CM | POA: Diagnosis not present

## 2019-07-14 DIAGNOSIS — I5033 Acute on chronic diastolic (congestive) heart failure: Secondary | ICD-10-CM | POA: Diagnosis not present

## 2019-07-16 DIAGNOSIS — I495 Sick sinus syndrome: Secondary | ICD-10-CM | POA: Diagnosis not present

## 2019-07-16 DIAGNOSIS — Z8701 Personal history of pneumonia (recurrent): Secondary | ICD-10-CM | POA: Diagnosis not present

## 2019-07-16 DIAGNOSIS — U071 COVID-19: Secondary | ICD-10-CM | POA: Diagnosis not present

## 2019-07-16 DIAGNOSIS — M1990 Unspecified osteoarthritis, unspecified site: Secondary | ICD-10-CM | POA: Diagnosis not present

## 2019-07-16 DIAGNOSIS — R41841 Cognitive communication deficit: Secondary | ICD-10-CM | POA: Diagnosis not present

## 2019-07-16 DIAGNOSIS — I110 Hypertensive heart disease with heart failure: Secondary | ICD-10-CM | POA: Diagnosis not present

## 2019-07-16 DIAGNOSIS — I4891 Unspecified atrial fibrillation: Secondary | ICD-10-CM | POA: Diagnosis not present

## 2019-07-16 DIAGNOSIS — J449 Chronic obstructive pulmonary disease, unspecified: Secondary | ICD-10-CM | POA: Diagnosis not present

## 2019-07-16 DIAGNOSIS — I5033 Acute on chronic diastolic (congestive) heart failure: Secondary | ICD-10-CM | POA: Diagnosis not present

## 2019-07-24 DIAGNOSIS — I110 Hypertensive heart disease with heart failure: Secondary | ICD-10-CM | POA: Diagnosis not present

## 2019-07-24 DIAGNOSIS — Z8701 Personal history of pneumonia (recurrent): Secondary | ICD-10-CM | POA: Diagnosis not present

## 2019-07-24 DIAGNOSIS — I495 Sick sinus syndrome: Secondary | ICD-10-CM | POA: Diagnosis not present

## 2019-07-24 DIAGNOSIS — M1990 Unspecified osteoarthritis, unspecified site: Secondary | ICD-10-CM | POA: Diagnosis not present

## 2019-07-24 DIAGNOSIS — U071 COVID-19: Secondary | ICD-10-CM | POA: Diagnosis not present

## 2019-07-24 DIAGNOSIS — R41841 Cognitive communication deficit: Secondary | ICD-10-CM | POA: Diagnosis not present

## 2019-07-24 DIAGNOSIS — I4891 Unspecified atrial fibrillation: Secondary | ICD-10-CM | POA: Diagnosis not present

## 2019-07-24 DIAGNOSIS — J449 Chronic obstructive pulmonary disease, unspecified: Secondary | ICD-10-CM | POA: Diagnosis not present

## 2019-07-24 DIAGNOSIS — I5033 Acute on chronic diastolic (congestive) heart failure: Secondary | ICD-10-CM | POA: Diagnosis not present

## 2019-07-29 DIAGNOSIS — I495 Sick sinus syndrome: Secondary | ICD-10-CM | POA: Diagnosis not present

## 2019-07-29 DIAGNOSIS — I4891 Unspecified atrial fibrillation: Secondary | ICD-10-CM | POA: Diagnosis not present

## 2019-07-29 DIAGNOSIS — U071 COVID-19: Secondary | ICD-10-CM | POA: Diagnosis not present

## 2019-07-29 DIAGNOSIS — I5033 Acute on chronic diastolic (congestive) heart failure: Secondary | ICD-10-CM | POA: Diagnosis not present

## 2019-07-29 DIAGNOSIS — J449 Chronic obstructive pulmonary disease, unspecified: Secondary | ICD-10-CM | POA: Diagnosis not present

## 2019-07-29 DIAGNOSIS — R41841 Cognitive communication deficit: Secondary | ICD-10-CM | POA: Diagnosis not present

## 2019-07-29 DIAGNOSIS — M1990 Unspecified osteoarthritis, unspecified site: Secondary | ICD-10-CM | POA: Diagnosis not present

## 2019-07-29 DIAGNOSIS — I110 Hypertensive heart disease with heart failure: Secondary | ICD-10-CM | POA: Diagnosis not present

## 2019-07-29 DIAGNOSIS — Z8701 Personal history of pneumonia (recurrent): Secondary | ICD-10-CM | POA: Diagnosis not present

## 2019-08-12 ENCOUNTER — Other Ambulatory Visit: Payer: Self-pay

## 2019-08-12 ENCOUNTER — Ambulatory Visit (INDEPENDENT_AMBULATORY_CARE_PROVIDER_SITE_OTHER): Payer: Medicare HMO | Admitting: *Deleted

## 2019-08-12 DIAGNOSIS — Z5181 Encounter for therapeutic drug level monitoring: Secondary | ICD-10-CM | POA: Diagnosis not present

## 2019-08-12 DIAGNOSIS — I4891 Unspecified atrial fibrillation: Secondary | ICD-10-CM

## 2019-08-12 LAB — POCT INR: INR: 3 (ref 2.0–3.0)

## 2019-08-12 NOTE — Patient Instructions (Signed)
Continue coumadin 1 1/2 tablets daily except 1 tablet on Mondays and Thursdays  Recheck in 5 weeks 

## 2019-09-16 ENCOUNTER — Other Ambulatory Visit: Payer: Self-pay

## 2019-09-16 ENCOUNTER — Ambulatory Visit (INDEPENDENT_AMBULATORY_CARE_PROVIDER_SITE_OTHER): Payer: Medicare HMO | Admitting: *Deleted

## 2019-09-16 DIAGNOSIS — I4891 Unspecified atrial fibrillation: Secondary | ICD-10-CM | POA: Diagnosis not present

## 2019-09-16 DIAGNOSIS — Z5181 Encounter for therapeutic drug level monitoring: Secondary | ICD-10-CM | POA: Diagnosis not present

## 2019-09-16 LAB — POCT INR: INR: 4.1 — AB (ref 2.0–3.0)

## 2019-09-16 NOTE — Patient Instructions (Signed)
Hold coumadin tonight then decrease dose to 1 tablet daily except 1 1/2 tablets on Mondays, Wednesdays and Fridays Recheck in 3 weeks

## 2019-09-28 ENCOUNTER — Encounter: Payer: Medicare HMO | Admitting: *Deleted

## 2019-10-07 ENCOUNTER — Ambulatory Visit (INDEPENDENT_AMBULATORY_CARE_PROVIDER_SITE_OTHER): Payer: Medicare HMO | Admitting: *Deleted

## 2019-10-07 ENCOUNTER — Other Ambulatory Visit: Payer: Self-pay

## 2019-10-07 DIAGNOSIS — I4891 Unspecified atrial fibrillation: Secondary | ICD-10-CM | POA: Diagnosis not present

## 2019-10-07 DIAGNOSIS — Z950 Presence of cardiac pacemaker: Secondary | ICD-10-CM | POA: Diagnosis not present

## 2019-10-07 DIAGNOSIS — Z5181 Encounter for therapeutic drug level monitoring: Secondary | ICD-10-CM

## 2019-10-07 LAB — POCT INR: INR: 2.4 (ref 2.0–3.0)

## 2019-10-07 NOTE — Patient Instructions (Signed)
Continue warfarin 1 tablet daily except 1 1/2 tablets on Mondays, Wednesdays and Fridays °Recheck in 4 weeks   °

## 2019-10-10 LAB — CUP PACEART REMOTE DEVICE CHECK
Date Time Interrogation Session: 20201126075445
Implantable Lead Implant Date: 20070518
Implantable Lead Implant Date: 20070518
Implantable Lead Location: 753859
Implantable Lead Location: 753860
Implantable Pulse Generator Implant Date: 20191003
Pulse Gen Model: 1272
Pulse Gen Serial Number: 9067608

## 2019-10-14 ENCOUNTER — Telehealth (INDEPENDENT_AMBULATORY_CARE_PROVIDER_SITE_OTHER): Payer: Medicare HMO | Admitting: Physician Assistant

## 2019-10-14 ENCOUNTER — Encounter: Payer: Self-pay | Admitting: Physician Assistant

## 2019-10-14 VITALS — Ht 65.0 in | Wt 138.0 lb

## 2019-10-14 DIAGNOSIS — I10 Essential (primary) hypertension: Secondary | ICD-10-CM

## 2019-10-14 DIAGNOSIS — I4819 Other persistent atrial fibrillation: Secondary | ICD-10-CM | POA: Diagnosis not present

## 2019-10-14 DIAGNOSIS — Z950 Presence of cardiac pacemaker: Secondary | ICD-10-CM

## 2019-10-14 NOTE — Patient Instructions (Signed)
Medication Instructions:  Your physician recommends that you continue on your current medications as directed. Please refer to the Current Medication list given to you today.  *If you need a refill on your cardiac medications before your next appointment, please call your pharmacy*  Lab Work: None today If you have labs (blood work) drawn today and your tests are completely normal, you will receive your results only by: . MyChart Message (if you have MyChart) OR . A paper copy in the mail If you have any lab test that is abnormal or we need to change your treatment, we will call you to review the results.  Testing/Procedures: None today  Follow-Up: At [facility], you and your health needs are our priority.  As part of our continuing mission to provide you with exceptional heart care, we have created designated Provider Care Teams.  These Care Teams include your primary Cardiologist (physician) and Advanced Practice Providers (APPs -  Physician Assistants and Nurse Practitioners) who all work together to provide you with the care you need, when you need it.  Your next appointment:   6 month(s)  The format for your next appointment:   Virtual Visit   Provider:   Jonathan Branch, MD  Other Instructions None     Thank you for choosing [facility] !         

## 2019-10-14 NOTE — Progress Notes (Signed)
Virtual Visit via Telephone Note   This visit type was conducted due to national recommendations for restrictions regarding the COVID-19 Pandemic (e.g. social distancing) in an effort to limit this patient's exposure and mitigate transmission in our community.  Due to her co-morbid illnesses, this patient is at least at moderate risk for complications without adequate follow up.  This format is felt to be most appropriate for this patient at this time.  The patient did not have access to video technology/had technical difficulties with video requiring transitioning to audio format only (telephone).  All issues noted in this document were discussed and addressed.  No physical exam could be performed with this format.  Please refer to the patient's chart for her  consent to telehealth for [facility].   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  Last office visit with Dr. Wyline Mood 03/2019 she was doing well.  Last saw Dr. Ladona Ridgel 11/2018 also stable.  Last device check 10/08/2019 functioning normally.   Patient was admitted with COVID-19 positive multilobar pneumonia 05/2019.  Patient lives alone. Step daughter helps her with groceries. Says she wheezes but not unusual. Denies chest pain, dizziness or presyncope. No bleeding problems on coumadin. Takes lasix about every 2-3 days based on ankle edema. Doesn't check her BP-says she goes by how she feels.  Says "she has a case of laziness."    The patient does not have symptoms concerning for COVID-19 infection (fever, chills, cough, or new shortness of breath).    Past Medical History:  Diagnosis Date   Arthritis    Atrial fibrillation (HCC)    COPD (chronic obstructive pulmonary disease) (HCC)    Hip pain, left    Hypertension    Sinoatrial node dysfunction (HCC)    Past Surgical History:  Procedure Laterality Date   BACK SURGERY     EYE SURGERY Bilateral    cataract removal   hip arthoplasty     total   KNEE ARTHROPLASTY     PPM GENERATOR CHANGEOUT N/A 08/14/2018   Procedure: PPM GENERATOR CHANGEOUT;  Surgeon: Marinus Maw, MD;  [facility]: [facility] INVASIVE CV LAB;  Service: Cardiovascular;  Laterality: N/A;   uterine polyp removal       Current Meds  Medication Sig   bisacodyl (DULCOLAX) 10 MG suppository Place 1 suppository (10 mg total) rectally daily as needed for moderate constipation.   furosemide (LASIX) 40 MG tablet Take 40 mg by mouth daily as needed for edema.    lisinopril (PRINIVIL,ZESTRIL) 40 MG tablet TAKE 1 TABLET BY MOUTH EVERY DAY   metoprolol tartrate (LOPRESSOR) 25 MG tablet Take 1 tablet (25 mg total) by mouth 2 (two) times daily.   Multiple Vitamin (MULTIVITAMIN) tablet Take 1 tablet by mouth daily.    polyethylene glycol (MIRALAX / GLYCOLAX) 17 g packet Take 17 g by mouth daily as needed for mild constipation.   senna-docusate (SENOKOT-S) 8.6-50 MG tablet Take 1 tablet by mouth 2 (two) times  daily.   simethicone (MYLICON) 80 MG chewable tablet Chew 1 tablet (80 mg total) by mouth 4 (four) times daily as needed for flatulence.   vitamin B-12 1000 MCG tablet Take 1 tablet (1,000 mcg total) by mouth daily.   warfarin (COUMADIN) 7.5 MG tablet Take 1 tablet (7.5 mg total) by mouth daily at 6 PM.     Allergies:   Patient has no known allergies.   Social History   Tobacco Use   Smoking status: Never Smoker   Smokeless tobacco: Never  Used  Substance Use Topics   Alcohol use: No    Alcohol/week: 0.0 standard drinks   Drug use: No     Family Hx: The patient's family history includes Depression in her maternal grandmother; Heart disease in her father; Heart failure in her father; Prostate cancer in her father; Stroke in her sister.  ROS:   Please see the history of present illness.      All other systems reviewed and are negative.   Prior CV studies:   The following studies were reviewed today:  2D echo 2018Study Conclusions   - Left ventricle: The cavity size was normal. Wall thickness was   increased in a pattern of mild LVH. Indeterminant diastolic   function (atrial fibrillation). Systolic function was normal. The   estimated ejection fraction was in the range of 55% to 60%. Wall   motion was normal; there were no regional wall motion   abnormalities. - Aortic valve: There was no stenosis. - Mitral valve: Mildly calcified annulus. There was trivial   regurgitation. - Left atrium: The atrium was severely dilated. - Right ventricle: The cavity size was normal. Pacer wire or   catheter noted in right ventricle. Systolic function was mildly   reduced. - Tricuspid valve: Peak RV-RA gradient (S): 35 mm Hg. - Pulmonary arteries: PA peak pressure: 43 mm Hg (S). - Systemic veins: IVC measured 2.3 cm with > 50% respirophasic   variation, suggesting RA pressure 8 mmHg.   Impressions:   - The patient was in atrial fibrillation. Normal LV size with mild   LV hypertrophy. EF 55-60%. Normal RV size with mildly decreased   systolic function. No significant valvular abnormalities. Severe   left atrial enlargement.     Labs/Other Tests and Data Reviewed:    EKG:  An ECG dated 05/19/2019 was personally reviewed today and demonstrated:  Ventricular paced rhythm with repolarization changes and deep T wave inversion inferior lateral which is new since 02/2018  Recent Labs: 05/19/2019: B Natriuretic Peptide  214.9 05/20/2019: ALT 20 05/22/2019: TSH 0.617 05/25/2019: BUN 16; Creatinine, Ser 0.59; Magnesium 2.3; Potassium 3.9; Sodium 136 06/01/2019: Hemoglobin 11.4; Platelets 318   Recent Lipid Panel Lab Results  Component Value Date/Time   CHOL 166 03/05/2007   TRIG 129 03/05/2007   HDL 50 03/05/2007   LDLCALC 90 03/05/2007    Wt Readings from Last 3 Encounters:  10/14/19 138 lb (62.6 kg)  05/28/19 143 lb 15.4 oz (65.3 kg)  03/25/19 145 lb (65.8 kg)     Objective:    Vital Signs:  Ht 5\' 5"  (1.651 m)    Wt 138 lb (62.6 kg)    BMI 22.96 kg/m    VITAL SIGNS:  reviewed  ASSESSMENT & PLAN:    1. Chronic atrial fibrillation on Coumadin-no bleeding problems. No recent falls 2. Status post pacemaker for sinoatrial node dysfunction with generator change 08/2018 followed by Dr. Elita Quickaylor-keep f/u in January. 3. Essential hypertension have been  accepting higher blood pressures due to prior falls.  Not checking her BP.  COVID-19 Education: The signs and symptoms of COVID-19 were discussed with the patient and how to seek care for testing (follow up with PCP or arrange E-visit).   The importance of social distancing was discussed today.  Time:   Today, I have spent 16 minutes with the patient with telehealth technology discussing the above problems.     Medication Adjustments/Labs and Tests Ordered: Current medicines are reviewed at length with the patient today.  Concerns regarding medicines are outlined above.   Tests Ordered: No orders of the defined types were placed in this encounter.   Medication Changes: No orders of the defined types were placed in this encounter.   Follow Up:  Either In Person or Virtual in 6 month(s)  Signed, Ermalinda Barrios, PA-C  10/14/2019 9:49 AM    [place]

## 2019-11-11 ENCOUNTER — Other Ambulatory Visit: Payer: Self-pay

## 2019-11-11 ENCOUNTER — Ambulatory Visit (INDEPENDENT_AMBULATORY_CARE_PROVIDER_SITE_OTHER): Payer: Medicare HMO | Admitting: *Deleted

## 2019-11-11 DIAGNOSIS — I4891 Unspecified atrial fibrillation: Secondary | ICD-10-CM

## 2019-11-11 DIAGNOSIS — Z5181 Encounter for therapeutic drug level monitoring: Secondary | ICD-10-CM | POA: Diagnosis not present

## 2019-11-11 LAB — POCT INR: INR: 2.2 (ref 2.0–3.0)

## 2019-11-11 NOTE — Patient Instructions (Signed)
Continue warfarin 1 tablet daily except 1 1/2 tablets on Mondays, Wednesdays and Fridays Recheck in 6 weeks   

## 2019-11-17 ENCOUNTER — Ambulatory Visit (INDEPENDENT_AMBULATORY_CARE_PROVIDER_SITE_OTHER): Payer: Medicare HMO | Admitting: Internal Medicine

## 2019-11-17 ENCOUNTER — Encounter: Payer: Self-pay | Admitting: Internal Medicine

## 2019-11-17 ENCOUNTER — Other Ambulatory Visit: Payer: Self-pay

## 2019-11-17 VITALS — BP 170/79 | HR 64 | Ht 65.0 in | Wt 146.0 lb

## 2019-11-17 DIAGNOSIS — I4821 Permanent atrial fibrillation: Secondary | ICD-10-CM

## 2019-11-17 DIAGNOSIS — I10 Essential (primary) hypertension: Secondary | ICD-10-CM | POA: Diagnosis not present

## 2019-11-17 DIAGNOSIS — Z950 Presence of cardiac pacemaker: Secondary | ICD-10-CM

## 2019-11-17 NOTE — Progress Notes (Signed)
HPI [name] returns today for followup. She has a h/o diastolic heart failure, chronic atrial fib and CHB, s/p PPM insertion. She got Covid 19 in the summer and was hospitalized with pneumonia but has recovered. She has some residual wheezing. No chest pain. Her dyspnea is back to baseline. She is taking lasix prn.  No Known Allergies   Current Outpatient Medications  Medication Sig Dispense Refill  . bisacodyl (DULCOLAX) 10 MG suppository Place 1 suppository (10 mg total) rectally daily as needed for moderate constipation. 12 suppository 0  . furosemide (LASIX) 40 MG tablet Take 40 mg by mouth daily as needed for edema.     Marland Kitchen lisinopril (PRINIVIL,ZESTRIL) 40 MG tablet TAKE 1 TABLET BY MOUTH EVERY DAY 90 tablet 3  . metoprolol tartrate (LOPRESSOR) 25 MG tablet Take 1 tablet (25 mg total) by mouth 2 (two) times daily. 180 tablet 3  . Multiple Vitamin (MULTIVITAMIN) tablet Take 1 tablet by mouth daily.     . polyethylene glycol (MIRALAX / GLYCOLAX) 17 g packet Take 17 g by mouth daily as needed for mild constipation. 14 each 0  . senna-docusate (SENOKOT-S) 8.6-50 MG tablet Take 1 tablet by mouth 2 (two) times daily.    . simethicone (MYLICON) 80 MG chewable tablet Chew 1 tablet (80 mg total) by mouth 4 (four) times daily as needed for flatulence. 30 tablet 0  . vitamin B-12 1000 MCG tablet Take 1 tablet (1,000 mcg total) by mouth daily.    Marland Kitchen warfarin (COUMADIN) 7.5 MG tablet Take 1 tablet (7.5 mg total) by mouth daily at 6 PM.     No current facility-administered medications for this visit.     Past Medical History:  Diagnosis Date  . Arthritis   . Atrial fibrillation ([place])   . COPD (chronic obstructive pulmonary disease) ([place])   . Hip pain, left   . Hypertension   . Sinoatrial node dysfunction (HCC)     ROS:   All systems reviewed and negative except as noted in the HPI.   Past Surgical History:  Procedure Laterality Date  . BACK SURGERY    . EYE SURGERY Bilateral     cataract removal  . hip arthoplasty     total  . KNEE ARTHROPLASTY    . PPM GENERATOR CHANGEOUT N/A 08/14/2018   Procedure: PPM GENERATOR CHANGEOUT;  Surgeon: Evans Lance, MD;  Location: [place] CV LAB;  Service: Cardiovascular;  Laterality: N/A;  . uterine polyp removal       Family History  Problem Relation Age of Onset  . Prostate cancer Father   . Heart failure Father   . Heart disease Father   . Stroke Sister   . Depression Maternal Grandmother      Social History   Socioeconomic History  . Marital status: Widowed    Spouse name: Not on file  . Number of children: Not on file  . Years of education: Not on file  . Highest education level: Not on file  Occupational History  . Occupation: Retired    Fish farm manager: RETIRED  Tobacco Use  . Smoking status: Never Smoker  . Smokeless tobacco: Never Used  Substance and Sexual Activity  . Alcohol use: No    Alcohol/week: 0.0 standard drinks  . Drug use: No  . Sexual activity: Not Currently    Birth control/protection: Post-menopausal, None  Other Topics Concern  . Not on file  Social History Narrative  . Not on file  Social Determinants of Health   Financial Resource Strain:   . Difficulty of Paying Living Expenses: Not on file  Food Insecurity:   . Worried About Programme researcher, broadcasting/film/video in the Last Year: Not on file  . Ran Out of Food in the Last Year: Not on file  Transportation Needs:   . Lack of Transportation (Medical): Not on file  . Lack of Transportation (Non-Medical): Not on file  Physical Activity:   . Days of Exercise per Week: Not on file  . Minutes of Exercise per Session: Not on file  Stress:   . Feeling of Stress : Not on file  Social Connections:   . Frequency of Communication with Friends and Family: Not on file  . Frequency of Social Gatherings with Friends and Family: Not on file  . Attends Religious Services: Not on file  . Active Member of Clubs or Organizations: Not on file  .  Attends Banker Meetings: Not on file  . Marital Status: Not on file  Intimate Partner Violence:   . Fear of Current or Ex-Partner: Not on file  . Emotionally Abused: Not on file  . Physically Abused: Not on file  . Sexually Abused: Not on file     BP (!) 170/79   Pulse 64   Ht 5\' 5"  (1.651 m)   Wt 146 lb (66.2 kg)   BMI 24.30 kg/m   Physical Exam:  Well appearing NAD HEENT: Unremarkable Neck:  No JVD, no thyromegally Lymphatics:  No adenopathy Back:  No CVA tenderness Lungs:  Clear with rare scattered wheeze HEART:  Regular rate rhythm, no murmurs, no rubs, no clicks Abd:  soft, positive bowel sounds, no organomegally, no rebound, no guarding Ext:  2 plus pulses, no edema, no cyanosis, no clubbing Skin:  No rashes no nodules Neuro:  CN II through XII intact, motor grossly intact  DEVICE  Normal device function.  See PaceArt for details.   Assess/Plan: 1. Atrial fib - her VR is well controlled. She has no palpitations. 2. Chronic diastolic heart failure - her symptoms remain class 2. She will continue her current meds. 3. HTN - her bp is up today. It has been well controlled. I asked her to take her lasix. She is encouraged to avoid salty food.  4. PPM - her St. Jude single chamber PM is working normally.    .D.

## 2019-11-17 NOTE — Patient Instructions (Signed)
Medication Instructions:  Your physician recommends that you continue on your current medications as directed. Please refer to the Current Medication list given to you today.  *If you need a refill on your cardiac medications before your next appointment, please call your pharmacy*  Lab Work: none If you have labs (blood work) drawn today and your tests are completely normal, you will receive your results only by: Marland Kitchen MyChart Message (if you have MyChart) OR . A paper copy in the mail If you have any lab test that is abnormal or we need to change your treatment, we will call you to review the results.  Testing/Procedures: none  Follow-Up: At [facility], you and your health needs are our priority.  As part of our continuing mission to provide you with exceptional heart care, we have created designated Provider Care Teams.  These Care Teams include your primary Cardiologist (physician) and Advanced Practice Providers (APPs -  Physician Assistants and Nurse Practitioners) who all work together to provide you with the care you need, when you need it.  Your next appointment:   12 month(s)  The format for your next appointment:   In Person  Provider:   Lewayne Bunting, MD  Other Instructions None     Thank you for choosing [facility] !

## 2020-01-05 ENCOUNTER — Ambulatory Visit (INDEPENDENT_AMBULATORY_CARE_PROVIDER_SITE_OTHER): Payer: Medicare HMO | Admitting: *Deleted

## 2020-01-05 ENCOUNTER — Other Ambulatory Visit: Payer: Self-pay

## 2020-01-05 DIAGNOSIS — I4891 Unspecified atrial fibrillation: Secondary | ICD-10-CM

## 2020-01-05 DIAGNOSIS — Z5181 Encounter for therapeutic drug level monitoring: Secondary | ICD-10-CM | POA: Diagnosis not present

## 2020-01-05 LAB — POCT INR: INR: 3.3 — AB (ref 2.0–3.0)

## 2020-01-05 NOTE — Patient Instructions (Signed)
Hold warfarin tonight then resume 1 tablet daily except 1 1/2 tablets on Mondays, Wednesdays and Fridays Recheck in 6 weeks 

## 2020-01-06 ENCOUNTER — Ambulatory Visit (INDEPENDENT_AMBULATORY_CARE_PROVIDER_SITE_OTHER): Payer: Medicare HMO | Admitting: *Deleted

## 2020-01-06 DIAGNOSIS — Z950 Presence of cardiac pacemaker: Secondary | ICD-10-CM

## 2020-01-06 LAB — CUP PACEART REMOTE DEVICE CHECK
Battery Remaining Longevity: 122 mo
Battery Remaining Percentage: 95.5 %
Battery Voltage: 3.01 V
Brady Statistic RV Percent Paced: 94 %
Date Time Interrogation Session: 20210224020207
Implantable Lead Implant Date: 20070518
Implantable Lead Implant Date: 20070518
Implantable Lead Location: 753859
Implantable Lead Location: 753860
Implantable Pulse Generator Implant Date: 20191003
Lead Channel Impedance Value: 680 Ohm
Lead Channel Pacing Threshold Amplitude: 0.5 V
Lead Channel Pacing Threshold Pulse Width: 0.5 ms
Lead Channel Sensing Intrinsic Amplitude: 12 mV
Lead Channel Setting Pacing Amplitude: 2.5 V
Lead Channel Setting Pacing Pulse Width: 0.5 ms
Lead Channel Setting Sensing Sensitivity: 2 mV
Pulse Gen Model: 1272
Pulse Gen Serial Number: 9067608

## 2020-01-07 NOTE — Progress Notes (Signed)
PPM Remote  

## 2020-02-15 ENCOUNTER — Ambulatory Visit (INDEPENDENT_AMBULATORY_CARE_PROVIDER_SITE_OTHER): Payer: Medicare HMO | Admitting: *Deleted

## 2020-02-15 ENCOUNTER — Other Ambulatory Visit: Payer: Self-pay

## 2020-02-15 DIAGNOSIS — I4891 Unspecified atrial fibrillation: Secondary | ICD-10-CM | POA: Diagnosis not present

## 2020-02-15 DIAGNOSIS — Z5181 Encounter for therapeutic drug level monitoring: Secondary | ICD-10-CM | POA: Diagnosis not present

## 2020-02-15 LAB — POCT INR: INR: 2.5 (ref 2.0–3.0)

## 2020-02-15 NOTE — Patient Instructions (Signed)
Continue 1 tablet daily except 1 1/2 tablets on Mondays, Wednesdays and Fridays Recheck in 6 weeks

## 2020-02-22 ENCOUNTER — Other Ambulatory Visit: Payer: Self-pay | Admitting: Cardiology

## 2020-03-02 ENCOUNTER — Ambulatory Visit: Payer: Medicare HMO | Admitting: Family Medicine

## 2020-03-03 ENCOUNTER — Encounter: Payer: Self-pay | Admitting: Nurse Practitioner

## 2020-03-03 ENCOUNTER — Ambulatory Visit (INDEPENDENT_AMBULATORY_CARE_PROVIDER_SITE_OTHER): Payer: Medicare HMO | Admitting: Nurse Practitioner

## 2020-03-03 ENCOUNTER — Other Ambulatory Visit: Payer: Self-pay

## 2020-03-03 VITALS — BP 108/56 | HR 64 | Temp 97.7°F | Resp 18 | Ht 65.5 in | Wt 144.4 lb

## 2020-03-03 DIAGNOSIS — Z950 Presence of cardiac pacemaker: Secondary | ICD-10-CM | POA: Diagnosis not present

## 2020-03-03 DIAGNOSIS — Z23 Encounter for immunization: Secondary | ICD-10-CM | POA: Diagnosis not present

## 2020-03-03 DIAGNOSIS — I10 Essential (primary) hypertension: Secondary | ICD-10-CM

## 2020-03-03 DIAGNOSIS — I4819 Other persistent atrial fibrillation: Secondary | ICD-10-CM

## 2020-03-03 DIAGNOSIS — Z13220 Encounter for screening for lipoid disorders: Secondary | ICD-10-CM

## 2020-03-03 DIAGNOSIS — Z0001 Encounter for general adult medical examination with abnormal findings: Secondary | ICD-10-CM | POA: Diagnosis not present

## 2020-03-03 DIAGNOSIS — Z0000 Encounter for general adult medical examination without abnormal findings: Secondary | ICD-10-CM

## 2020-03-03 DIAGNOSIS — Z7689 Persons encountering health services in other specified circumstances: Secondary | ICD-10-CM

## 2020-03-03 DIAGNOSIS — J439 Emphysema, unspecified: Secondary | ICD-10-CM

## 2020-03-03 LAB — CBC WITH DIFFERENTIAL/PLATELET
Absolute Monocytes: 531 cells/uL (ref 200–950)
Basophils Absolute: 51 cells/uL (ref 0–200)
Basophils Relative: 0.8 %
Eosinophils Absolute: 141 cells/uL (ref 15–500)
Eosinophils Relative: 2.2 %
HCT: 38.7 % (ref 35.0–45.0)
Hemoglobin: 12.8 g/dL (ref 11.7–15.5)
Lymphs Abs: 986 cells/uL (ref 850–3900)
MCH: 33.9 pg — ABNORMAL HIGH (ref 27.0–33.0)
MCHC: 33.1 g/dL (ref 32.0–36.0)
MCV: 102.4 fL — ABNORMAL HIGH (ref 80.0–100.0)
MPV: 12.6 fL — ABNORMAL HIGH (ref 7.5–12.5)
Monocytes Relative: 8.3 %
Neutro Abs: 4691 cells/uL (ref 1500–7800)
Neutrophils Relative %: 73.3 %
Platelets: 241 10*3/uL (ref 140–400)
RBC: 3.78 10*6/uL — ABNORMAL LOW (ref 3.80–5.10)
RDW: 12.2 % (ref 11.0–15.0)
Total Lymphocyte: 15.4 %
WBC: 6.4 10*3/uL (ref 3.8–10.8)

## 2020-03-03 LAB — COMPLETE METABOLIC PANEL WITH GFR
AG Ratio: 1.2 (calc) (ref 1.0–2.5)
ALT: 14 U/L (ref 6–29)
AST: 20 U/L (ref 10–35)
Albumin: 3.7 g/dL (ref 3.6–5.1)
Alkaline phosphatase (APISO): 77 U/L (ref 37–153)
BUN: 20 mg/dL (ref 7–25)
CO2: 28 mmol/L (ref 20–32)
Calcium: 9 mg/dL (ref 8.6–10.4)
Chloride: 101 mmol/L (ref 98–110)
Creat: 0.77 mg/dL (ref 0.60–0.88)
GFR, Est African American: 77 mL/min/{1.73_m2} (ref >=60)
GFR, Est Non African American: 66 mL/min/{1.73_m2} (ref >=60)
Globulin: 3.1 g/dL (calc) (ref 1.9–3.7)
Glucose, Bld: 120 mg/dL — ABNORMAL HIGH (ref 65–99)
Potassium: 4.3 mmol/L (ref 3.5–5.3)
Sodium: 139 mmol/L (ref 135–146)
Total Bilirubin: 0.7 mg/dL (ref 0.2–1.2)
Total Protein: 6.8 g/dL (ref 6.1–8.1)

## 2020-03-03 LAB — LIPID PANEL
Cholesterol: 155 mg/dL (ref <200)
HDL: 54 mg/dL (ref >=50)
LDL Cholesterol (Calc): 82 mg/dL (calc)
Non-HDL Cholesterol (Calc): 101 mg/dL (calc) (ref <130)
Total CHOL/HDL Ratio: 2.9 (calc) (ref <5.0)
Triglycerides: 96 mg/dL (ref <150)

## 2020-03-03 MED ORDER — BOOSTRIX 5-2.5-18.5 LF-MCG/0.5 IM SUSP: 0.5000 mL | Freq: Once | INTRAMUSCULAR | 0 refills | Status: AC

## 2020-03-03 NOTE — Progress Notes (Addendum)
New Patient Office Visit  Subjective:  Patient ID: [name], female    DOB: 15-Oct-1923  Age: 86 y.o. MRN: [id number]  CC:  Chief Complaint  Patient presents with  . Establish Care    NP    HPI [name] is an elderly 86 year old female accompanied by her neighbor presenting to establish care. Her health hx reviewed. Fall risk reviewed and addressed. No cp, ct, gu/gi sxs, pain, edema, sob, pain, fall in past year has life alert. She is slightly HOH. Retired Geologist, engineering. Lives alone with supportive neighbors she calls her children. She has cards and pulm in place for Afib/warfarin management and copd.   Past Medical History:  Diagnosis Date  . Arthritis   . Atrial fibrillation (HCC)   . COPD (chronic obstructive pulmonary disease) (HCC)   . Hip pain, left   . Hypertension   . Sinoatrial node dysfunction (HCC)     Past Surgical History:  Procedure Laterality Date  . BACK SURGERY    . EYE SURGERY Bilateral    cataract removal  . hip arthoplasty     total  . KNEE ARTHROPLASTY    . PPM GENERATOR CHANGEOUT N/A 08/14/2018   Procedure: PPM GENERATOR CHANGEOUT;  Surgeon: Marinus Maw, MD;  Location: [facility] INVASIVE CV LAB;  Service: Cardiovascular;  Laterality: N/A;  . uterine polyp removal      Family History  Problem Relation Age of Onset  . Prostate cancer Father   . Heart failure Father   . Heart disease Father   . Stroke Sister   . Depression Maternal Grandmother     Social History   Socioeconomic History  . Marital status: Widowed    Spouse name: Not on file  . Number of children: Not on file  . Years of education: Not on file  . Highest education level: Not on file  Occupational History  . Occupation: Retired    Associate Professor: RETIRED  Tobacco Use  . Smoking status: Never Smoker  . Smokeless tobacco: Never Used  Substance and Sexual Activity  . Alcohol use: No    Alcohol/week: 0.0 standard drinks  . Drug use: No  . Sexual activity: Not  Currently    Birth control/protection: Post-menopausal, None  Other Topics Concern  . Not on file  Social History Narrative  . Not on file   Social Determinants of Health   Financial Resource Strain:   . Difficulty of Paying Living Expenses:   Food Insecurity:   . Worried About Programme researcher, broadcasting/film/video in the Last Year:   . Barista in the Last Year:   Transportation Needs:   . Freight forwarder (Medical):   Marland Kitchen Lack of Transportation (Non-Medical):   Physical Activity:   . Days of Exercise per Week:   . Minutes of Exercise per Session:   Stress:   . Feeling of Stress :   Social Connections:   . Frequency of Communication with Friends and Family:   . Frequency of Social Gatherings with Friends and Family:   . Attends Religious Services:   . Active Member of Clubs or Organizations:   . Attends Banker Meetings:   Marland Kitchen Marital Status:   Intimate Partner Violence:   . Fear of Current or Ex-Partner:   . Emotionally Abused:   Marland Kitchen Physically Abused:   . Sexually Abused:     ROS Review of Systems  All other systems reviewed and are negative.  Objective:   Today's Vitals: BP (!) 108/56 (BP Location: Left Arm, Patient Position: Sitting, Cuff Size: Normal)   Pulse 64   Temp 97.7 F (36.5 C) (Temporal)   Resp 18   Ht 5' 5.5" (1.664 m)   Wt 144 lb 6.4 oz (65.5 kg)   SpO2 100%   BMI 23.66 kg/m   Physical Exam Vitals and nursing note reviewed.  Constitutional:      Appearance: Normal appearance. She is well-developed and well-groomed. She is not ill-appearing.  HENT:     Head: Normocephalic.     Right Ear: External ear normal. No decreased hearing noted.     Left Ear: External ear normal. No decreased hearing noted.     Nose: Nose normal.     Mouth/Throat:     Lips: Pink.     Mouth: Mucous membranes are dry.     Pharynx: Oropharynx is clear.  Eyes:     General: Lids are normal. Lids are everted, no foreign bodies appreciated.     Extraocular  Movements: Extraocular movements intact.     Conjunctiva/sclera: Conjunctivae normal.     Pupils: Pupils are equal, round, and reactive to light.  Neck:     Thyroid: No thyromegaly.     Vascular: No carotid bruit.  Cardiovascular:     Rate and Rhythm: Normal rate. Rhythm irregular.     Pulses: Normal pulses.     Heart sounds: Normal heart sounds.  Pulmonary:     Effort: Pulmonary effort is normal.     Breath sounds: Normal breath sounds.  Chest:     Chest wall: No deformity.  Abdominal:     General: Abdomen is flat. Bowel sounds are normal. There is no abdominal bruit.     Palpations: Abdomen is soft.  Musculoskeletal:        General: Normal range of motion.     Cervical back: Full passive range of motion without pain, normal range of motion and neck supple.     Right lower leg: No edema.     Left lower leg: No edema.     Comments: Can at all times indoors has wheelchair for outdoor, supportive neighbors across the street present  Lymphadenopathy:     Cervical: No cervical adenopathy.  Skin:    General: Skin is warm and dry.     Capillary Refill: Capillary refill takes less than 2 seconds.  Neurological:     General: No focal deficit present.     Mental Status: She is alert and oriented to person, place, and time.  Psychiatric:        Attention and Perception: Attention normal.        Mood and Affect: Mood normal.        Speech: Speech normal.        Behavior: Behavior normal. Behavior is cooperative.        Thought Content: Thought content normal.        Judgment: Judgment normal.     Assessment & Plan:   Problem List Items Addressed This Visit      Cardiovascular and Mediastinum   Essential hypertension   Relevant Orders   COMPLETE METABOLIC PANEL WITH GFR   CBC with Differential/Platelet   Atrial fibrillation (HCC) - Primary     Respiratory   COPD (chronic obstructive pulmonary disease) (HCC)     Other   PACEMAKER, PERMANENT    Other Visit Diagnoses     Encounter to establish care  Relevant Orders   COMPLETE METABOLIC PANEL WITH GFR   CBC with Differential/Platelet   Lipid panel   General medical exam       Relevant Orders   COMPLETE METABOLIC PANEL WITH GFR   CBC with Differential/Platelet   Lipid panel   Lipid screening       Relevant Orders   Lipid panel   Need for vaccination against Streptococcus pneumoniae using pneumococcal conjugate vaccine 13       Need for Tdap vaccination        Established care today, lab draw and will call with results and instructions Keep appointments with pulmonary for COPD and cardiology for INR management and pacemaker monitoring Administered Pneumonia 13 vaccine and Td vaccine CDC print out provided Fall precautions- print out provided, keep life alert with you at all times pressing alert button for assistance.   Follow up every 6 months with labs one week prior    Outpatient Encounter Medications as of 03/03/2020  Medication Sig  . furosemide (LASIX) 40 MG tablet Take 40 mg by mouth daily as needed for edema.   Marland Kitchen lisinopril (PRINIVIL,ZESTRIL) 40 MG tablet TAKE 1 TABLET BY MOUTH EVERY DAY  . metoprolol tartrate (LOPRESSOR) 25 MG tablet Take 1 tablet (25 mg total) by mouth 2 (two) times daily.  . Multiple Vitamin (MULTIVITAMIN) tablet Take 1 tablet by mouth daily.   . polyethylene glycol (MIRALAX / GLYCOLAX) 17 g packet Take 17 g by mouth daily as needed for mild constipation.  . senna-docusate (SENOKOT-S) 8.6-50 MG tablet Take 1 tablet by mouth 2 (two) times daily.  Marland Kitchen warfarin (COUMADIN) 4 MG tablet Take 1 tablet daily except 1 1/2 tablets on Mondays, Wednesdays and Fridays or as directed  . warfarin (COUMADIN) 7.5 MG tablet Take 1 tablet (7.5 mg total) by mouth daily at 6 PM.  . [DISCONTINUED] simethicone (MYLICON) 80 MG chewable tablet Chew 1 tablet (80 mg total) by mouth 4 (four) times daily as needed for flatulence.  . Tdap (BOOSTRIX) 5-2.5-18.5 LF-MCG/0.5 injection Inject 0.5 mLs into  the muscle once for 1 dose.  . [DISCONTINUED] bisacodyl (DULCOLAX) 10 MG suppository Place 1 suppository (10 mg total) rectally daily as needed for moderate constipation.  . [DISCONTINUED] vitamin B-12 1000 MCG tablet Take 1 tablet (1,000 mcg total) by mouth daily.   No facility-administered encounter medications on file as of 03/03/2020.    Follow-up: Return in about 6 months (around 09/02/2020) for labs one week prior.   Annie Main, FNP

## 2020-03-03 NOTE — Addendum Note (Signed)
Addended by: Phillips Odor on: 03/03/2020 11:53 AM   Modules accepted: Orders

## 2020-03-03 NOTE — Patient Instructions (Addendum)
Established care today, lab draw and will call with results and instructions Keep appointments with pulmonary for COPD and cardiology for INR management and pacemaker monitoring management and pacemaker monitoring Consider Pneumonia 13 vaccine and Tdap vaccine you are due, you may call office to come in to receive when you decide Fall precautions- print out provided, keep life alert with you at all times pressing alert button for assistance.   Follow up every 6 months with labs one week prior

## 2020-03-07 ENCOUNTER — Other Ambulatory Visit: Payer: Self-pay | Admitting: Nurse Practitioner

## 2020-03-07 DIAGNOSIS — D539 Nutritional anemia, unspecified: Secondary | ICD-10-CM

## 2020-03-07 NOTE — Progress Notes (Signed)
Please find out if pt has ever had a h/o low B12 or Folate? I do not see in records. Let me know.   I have order B12 and folate labs related to macrocytic anemia results.

## 2020-03-28 ENCOUNTER — Ambulatory Visit (INDEPENDENT_AMBULATORY_CARE_PROVIDER_SITE_OTHER): Payer: Medicare HMO | Admitting: *Deleted

## 2020-03-28 ENCOUNTER — Other Ambulatory Visit: Payer: Self-pay

## 2020-03-28 DIAGNOSIS — Z5181 Encounter for therapeutic drug level monitoring: Secondary | ICD-10-CM

## 2020-03-28 DIAGNOSIS — I4891 Unspecified atrial fibrillation: Secondary | ICD-10-CM | POA: Diagnosis not present

## 2020-03-28 LAB — POCT INR: INR: 2.7 (ref 2.0–3.0)

## 2020-03-28 NOTE — Patient Instructions (Signed)
Continue 1 tablet daily except 1 1/2 tablets on Mondays, Wednesdays and Fridays Recheck in 7 weeks

## 2020-04-06 ENCOUNTER — Ambulatory Visit (INDEPENDENT_AMBULATORY_CARE_PROVIDER_SITE_OTHER): Payer: Medicare HMO | Admitting: *Deleted

## 2020-04-06 DIAGNOSIS — I4819 Other persistent atrial fibrillation: Secondary | ICD-10-CM

## 2020-04-06 DIAGNOSIS — I495 Sick sinus syndrome: Secondary | ICD-10-CM

## 2020-04-06 LAB — CUP PACEART REMOTE DEVICE CHECK
Battery Remaining Longevity: 128 mo
Battery Remaining Percentage: 95.5 %
Battery Voltage: 3.01 V
Brady Statistic RV Percent Paced: 94 %
Date Time Interrogation Session: 20210526020013
Implantable Lead Implant Date: 20070518
Implantable Lead Implant Date: 20070518
Implantable Lead Location: 753859
Implantable Lead Location: 753860
Implantable Pulse Generator Implant Date: 20191003
Lead Channel Impedance Value: 650 Ohm
Lead Channel Pacing Threshold Amplitude: 0.5 V
Lead Channel Pacing Threshold Pulse Width: 0.5 ms
Lead Channel Sensing Intrinsic Amplitude: 12 mV
Lead Channel Setting Pacing Amplitude: 2.5 V
Lead Channel Setting Pacing Pulse Width: 0.5 ms
Lead Channel Setting Sensing Sensitivity: 2 mV
Pulse Gen Model: 1272
Pulse Gen Serial Number: 9067608

## 2020-04-06 NOTE — Progress Notes (Signed)
Remote pacemaker transmission.   

## 2020-04-19 ENCOUNTER — Other Ambulatory Visit: Payer: Self-pay

## 2020-04-19 ENCOUNTER — Encounter: Payer: Self-pay | Admitting: Cardiology

## 2020-04-19 ENCOUNTER — Ambulatory Visit (INDEPENDENT_AMBULATORY_CARE_PROVIDER_SITE_OTHER): Payer: Medicare HMO | Admitting: Cardiology

## 2020-04-19 VITALS — BP 146/70 | HR 62 | Ht 65.5 in | Wt 148.2 lb

## 2020-04-19 DIAGNOSIS — I495 Sick sinus syndrome: Secondary | ICD-10-CM | POA: Diagnosis not present

## 2020-04-19 DIAGNOSIS — I4891 Unspecified atrial fibrillation: Secondary | ICD-10-CM | POA: Diagnosis not present

## 2020-04-19 DIAGNOSIS — I10 Essential (primary) hypertension: Secondary | ICD-10-CM | POA: Diagnosis not present

## 2020-04-19 MED ORDER — APIXABAN 5 MG PO TABS
5.0000 mg | ORAL_TABLET | Freq: Two times a day (BID) | ORAL | 0 refills | Status: DC
Start: 2020-04-19 — End: 2020-04-19

## 2020-04-19 MED ORDER — APIXABAN 5 MG PO TABS: 5.0000 mg | ORAL_TABLET | Freq: Two times a day (BID) | ORAL | 0 refills | Status: DC

## 2020-04-19 MED ORDER — APIXABAN 5 MG PO TABS: 5.0000 mg | ORAL_TABLET | Freq: Two times a day (BID) | ORAL | 6 refills | Status: DC

## 2020-04-19 NOTE — Progress Notes (Signed)
Clinical Summary Ms. Mormile is a 86 y.o.female  seen today for follow up of the following medical problems.    1. Afib - has not been interested in NOACs.No bleeding issues on coumadin   - no recent palpitations - compliatn with meds  2. Tachy-Brady Syndrome  - has permanent pacemaker followed by EP Dr Lovena Le - - 03/2020 normal device check.  - no symptoms  3. HTN  -We have been accepting high bp's for her due to prior issues with falls - remains compliant with meds  4. COVID 19 in July 2020     SH: retired cardiology nurse at [employer]   Past Medical History:  Diagnosis Date  . Arthritis   . Atrial fibrillation ([place])   . COPD (chronic obstructive pulmonary disease) ([place])   . Hip pain, left   . Hypertension   . Sinoatrial node dysfunction (HCC)      No Known Allergies   Current Outpatient Medications  Medication Sig Dispense Refill  . furosemide (LASIX) 40 MG tablet Take 40 mg by mouth daily as needed for edema.     Marland Kitchen lisinopril (PRINIVIL,ZESTRIL) 40 MG tablet TAKE 1 TABLET BY MOUTH EVERY DAY 90 tablet 3  . metoprolol tartrate (LOPRESSOR) 25 MG tablet Take 1 tablet (25 mg total) by mouth 2 (two) times daily. 180 tablet 3  . Multiple Vitamin (MULTIVITAMIN) tablet Take 1 tablet by mouth daily.     . polyethylene glycol (MIRALAX / GLYCOLAX) 17 g packet Take 17 g by mouth daily as needed for mild constipation. 14 each 0  . senna-docusate (SENOKOT-S) 8.6-50 MG tablet Take 1 tablet by mouth 2 (two) times daily.    Marland Kitchen warfarin (COUMADIN) 4 MG tablet Take 1 tablet daily except 1 1/2 tablets on Mondays, Wednesdays and Fridays or as directed 135 tablet 3  . warfarin (COUMADIN) 7.5 MG tablet Take 1 tablet (7.5 mg total) by mouth daily at 6 PM.     No current facility-administered medications for this visit.     Past Surgical History:  Procedure Laterality Date  . BACK SURGERY    . EYE SURGERY Bilateral    cataract removal  . hip arthoplasty     total  . KNEE ARTHROPLASTY    . PPM GENERATOR CHANGEOUT N/A 08/14/2018   Procedure: PPM GENERATOR CHANGEOUT;  Surgeon: Evans Lance, MD;  Location: [facility] CV LAB;  Service: Cardiovascular;  Laterality: N/A;  . uterine polyp removal       No Known Allergies    Family History  Problem Relation Age of Onset  . Prostate cancer Father   . Heart failure Father   . Heart disease Father   . Stroke Sister   . Depression Maternal Grandmother      Social History Ms. Pineo reports that she has never smoked. She has never used smokeless tobacco. Ms. Osmanovic reports no history of alcohol use.   Review of Systems CONSTITUTIONAL: No weight loss, fever, chills, weakness or fatigue.  HEENT: Eyes: No visual loss, blurred vision, double vision or yellow sclerae.No hearing loss, sneezing, congestion, runny nose or sore throat.  SKIN: No rash or itching.  CARDIOVASCULAR: per hpi RESPIRATORY: No shortness of breath, cough or sputum.  GASTROINTESTINAL: No anorexia, nausea, vomiting or diarrhea. No abdominal pain or blood.  GENITOURINARY: No burning on urination, no polyuria NEUROLOGICAL: No headache, dizziness, syncope, paralysis, ataxia, numbness or tingling in the extremities. No change in bowel or bladder control.  MUSCULOSKELETAL: No muscle,  back pain, joint pain or stiffness.  LYMPHATICS: No enlarged nodes. No history of splenectomy.  PSYCHIATRIC: No history of depression or anxiety.  ENDOCRINOLOGIC: No reports of sweating, cold or heat intolerance. No polyuria or polydipsia.  Marland Kitchen   Physical Examination Today's Vitals   04/19/20 1109  BP: (!) 146/70  Pulse: 62  SpO2: 98%  Weight: 148 lb 3.2 oz (67.2 kg)  Height: 5' 5.5" (1.664 m)   Body mass index is 24.29 kg/m.  Gen: resting comfortably, no acute distress HEENT: no scleral icterus, pupils equal round and reactive, no palptable cervical adenopathy,  CV: RRR, no m/r/g, no jvd Resp: Clear to auscultation bilaterally GI:  abdomen is soft, non-tender, non-distended, normal bowel sounds, no hepatosplenomegaly MSK: extremities are warm, no edema.  Skin: warm, no rash Neuro:  no focal deficits Psych: appropriate affect   Diagnostic Studies     Assessment and Plan  1. Afib  - previously not interested in NOACs, today she does agree to transition from coumadin to eliquis - d/c coumadion, wait 3 days then start eliquis 5mg  bid. Despite her age she is 67 kg and Cr 0.77 indicating full dose is correct for her.   2. Tachy-brady syndrome  - normal recent device check - no recent symptoms, continue to monitor.  EKG today shows V pacing  3. HTN  - tolerating higher goal bp's due to advanced age and prior falls - she will continue curernt meds   F/u 6 months  , M.D

## 2020-04-19 NOTE — Patient Instructions (Signed)
Medication Instructions:   Stop Coumadin today.  Begin Eliquis 5mg  twice a day on Friday, 04/22/2020.   Continue all other medications.    Labwork: none  Testing/Procedures: none  Follow-Up: 6 months   Any Other Special Instructions Will Be Listed Below (If Applicable). 1 month follow up with 06/22/2020, RN - anticoagulation nurse for new Eliquis management.  If you need a refill on your cardiac medications before your next appointment, please call your pharmacy.

## 2020-04-27 ENCOUNTER — Encounter (INDEPENDENT_AMBULATORY_CARE_PROVIDER_SITE_OTHER): Payer: Self-pay | Admitting: Ophthalmology

## 2020-04-27 ENCOUNTER — Other Ambulatory Visit: Payer: Self-pay

## 2020-04-27 ENCOUNTER — Ambulatory Visit (INDEPENDENT_AMBULATORY_CARE_PROVIDER_SITE_OTHER): Payer: Medicare HMO | Admitting: Ophthalmology

## 2020-04-27 DIAGNOSIS — Z961 Presence of intraocular lens: Secondary | ICD-10-CM | POA: Insufficient documentation

## 2020-04-27 DIAGNOSIS — H353114 Nonexudative age-related macular degeneration, right eye, advanced atrophic with subfoveal involvement: Secondary | ICD-10-CM | POA: Diagnosis not present

## 2020-04-27 DIAGNOSIS — H353122 Nonexudative age-related macular degeneration, left eye, intermediate dry stage: Secondary | ICD-10-CM

## 2020-04-27 DIAGNOSIS — H35371 Puckering of macula, right eye: Secondary | ICD-10-CM | POA: Diagnosis not present

## 2020-04-27 NOTE — Assessment & Plan Note (Signed)

## 2020-04-27 NOTE — Progress Notes (Signed)
04/27/2020     CHIEF COMPLAINT Patient presents for Retina Follow Up   HISTORY OF PRESENT ILLNESS: [name] is a 86 y.o. female who presents to the clinic today for:   HPI    Retina Follow Up    Patient presents with  Dry AMD.  In both eyes.  This started 1 year ago.  Severity is mild.  Duration of 1 year.  Since onset it is stable.          Comments    1 Year AMD F/U OU  Pt denies noticeable changes to [place] OU since last visit. Pt denies ocular pain, flashes of light, or floaters OU. Pt c/o difficulty seeing small print, but sts, "I can see most of them."         Last edited by Ileana Roup, COA on 04/27/2020  1:19 PM. (History)      Referring physician: Elmore Guise, FNP [id number] [place],  [place] [address]  HISTORICAL INFORMATION:   Selected notes from the MEDICAL RECORD NUMBER       CURRENT MEDICATIONS: No current outpatient medications on file. (Ophthalmic Drugs)   No current facility-administered medications for this visit. (Ophthalmic Drugs)   Current Outpatient Medications (Other)  Medication Sig  . apixaban (ELIQUIS) 5 MG TABS tablet Take 1 tablet (5 mg total) by mouth 2 (two) times daily.  . furosemide (LASIX) 40 MG tablet Take 40 mg by mouth daily as needed for edema.   Marland Kitchen lisinopril (PRINIVIL,ZESTRIL) 40 MG tablet TAKE 1 TABLET BY MOUTH EVERY DAY  . metoprolol tartrate (LOPRESSOR) 25 MG tablet Take 1 tablet (25 mg total) by mouth 2 (two) times daily.  . Multiple Vitamin (MULTIVITAMIN) tablet Take 1 tablet by mouth daily.    No current facility-administered medications for this visit. (Other)      REVIEW OF SYSTEMS:    ALLERGIES No Known Allergies  PAST MEDICAL HISTORY Past Medical History:  Diagnosis Date  . Arthritis   . Atrial fibrillation (HCC)   . COPD (chronic obstructive pulmonary disease) (HCC)   . Hip pain, left   . Hypertension   . Sinoatrial node dysfunction (HCC)    Past Surgical History:  Procedure  Laterality Date  . BACK SURGERY    . EYE SURGERY Bilateral    cataract removal  . hip arthoplasty     total  . KNEE ARTHROPLASTY    . PPM GENERATOR CHANGEOUT N/A 08/14/2018   Procedure: PPM GENERATOR CHANGEOUT;  Surgeon: Marinus Maw, MD;  Location: [facility] INVASIVE CV LAB;  Service: Cardiovascular;  Laterality: N/A;  . uterine polyp removal      FAMILY HISTORY Family History  Problem Relation Age of Onset  . Prostate cancer Father   . Heart failure Father   . Heart disease Father   . Stroke Sister   . Depression Maternal Grandmother     SOCIAL HISTORY Social History   Tobacco Use  . Smoking status: Never Smoker  . Smokeless tobacco: Never Used  Vaping Use  . Vaping Use: Never used  Substance Use Topics  . Alcohol use: No    Alcohol/week: 0.0 standard drinks  . Drug use: No         OPHTHALMIC EXAM:  Base Eye Exam    Visual Acuity (ETDRS)      Right Left   Dist cc 20/200 20/25 -1   Dist ph cc NI    Correction: Glasses  Tonometry (Tonopen, 1:23 PM)      Right Left   Pressure 17 13       Pupils      Pupils Dark Light Shape React APD   Right PERRL 3 2 Round Slow None   Left PERRL 3 2 Round Slow None       Visual Fields (Counting fingers)      Left Right    Full Full       Extraocular Movement      Right Left    Full Full       Neuro/Psych    Oriented x3: Yes   Mood/Affect: Normal       Dilation    Both eyes: 1.0% Mydriacyl, 2.5% Phenylephrine @ 1:23 PM        Slit Lamp and Fundus Exam    External Exam      Right Left   External Normal Normal       Slit Lamp Exam      Right Left   Lids/Lashes Normal Normal   Conjunctiva/Sclera White and quiet White and quiet   Cornea Clear Clear   Anterior Chamber Deep and quiet Deep and quiet   Iris Round and reactive Round and reactive   Lens Centered posterior chamber intraocular lens Centered posterior chamber intraocular lens   Anterior Vitreous Normal Normal       Fundus Exam       Right Left   Posterior Vitreous Normal Normal   Disc Normal Normal   C/D Ratio 0.6 0.6   Macula Retinal pigment epithelial mottling, Geographic atrophy Retinal pigment epithelial mottling,    Vessels Normal    Periphery Normal           IMAGING AND PROCEDURES  Imaging and Procedures for 04/27/20  Color Fundus Photography Optos - OU - Both Eyes       Right Eye Progression has been stable. Disc findings include normal observations. Macula : drusen, geographic atrophy, retinal pigment epithelium abnormalities. Vessels : normal observations. Periphery : normal observations.   Left Eye Progression has been stable. Macula : retinal pigment epithelium abnormalities, geographic atrophy, drusen. Vessels : normal observations. Periphery : normal observations.   Notes OD, geographic atrophy accounts for the acuity  OS, RPE mottling and areas of atrophy with hard drusen                ASSESSMENT/PLAN:  Intermediate 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 The nature of age--related macular degeneration was discussed with the patient as well as the distinction between dry and wet types. Checking an [place] Grid daily with advice to return immediately should a distortion develop, was given to the patient. The patient 's smoking status now and in the past was determined and advice based on the AREDS study was provided regarding the consumption of antioxidant supplements. AREDS 2 vitamin formulation was recommended. Consumption of dark leafy vegetables and fresh fruits of various colors was recommended. Treatment modalities for wet macular degeneration particularly the use of intravitreal injections of anti-blood vessel growth factors was discussed with the patient. Avastin, Lucentis, and Eylea are the available options. On occasion, therapy includes the use of photodynamic therapy and thermal laser. Stressed to the patient do not rub eyes. All patient questions were  answered.  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 The results  of the AREDS 2 study was discussed with the patient. A diet rich in dark leafy green vegetables was advised and specific recommendations were made regarding supplements with AREDS 2 formulation . Control of hypertension and serum cholesterol may slow the disease. Smoking cessation is mandatory to slow the disease and diminish the risk of progressing to wet age related macular degeneration. The patient was instructed in the use of an [place] and was told to return immediately for any changes in the Grid. Stressed to the patient do not rub eyes      ICD-10-CM   1. 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 macular degeneration of right eye with subfoveal involvement  H35.3114 Color Fundus Photography Optos - OU - Both Eyes  2. Right epiretinal membrane  H35.371   3. Pseudophakia  Z96.1   4. Intermediate 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  H35.3122     1.  No active macular disease OU, will observe  2.  3.  Ophthalmic Meds Ordered this visit:  No orders of the defined types were placed in this encounter.      Return in about 1 year (around 04/27/2021) for DILATE OU, OCT.  There are no Patient Instructions on file for this visit.   Explained the diagnoses, plan, and follow up with the patient and they expressed understanding.  Patient expressed understanding of the importance of proper follow up care.   Clent Demark Allex Madia M.D. Diseases & Surgery of the Retina and Vitreous [facility] [place] 04/27/20     Abbreviations: M myopia (nearsighted); A astigmatism; H hyperopia (farsighted); P presbyopia; Mrx spectacle prescription;  CTL contact lenses; OD right eye; OS left eye; OU both eyes  XT exotropia; ET esotropia;  PEK punctate epithelial keratitis; PEE punctate epithelial erosions; DES dry eye syndrome; MGD meibomian gland dysfunction; ATs artificial tears; PFAT's preservative free artificial tears; [place] nuclear sclerotic cataract; PSC posterior subcapsular cataract; ERM epi-retinal membrane; PVD posterior vitreous detachment; RD retinal detachment; DM diabetes mellitus; DR diabetic retinopathy; NPDR non-proliferative diabetic retinopathy; PDR proliferative diabetic retinopathy; CSME clinically significant macular edema; DME diabetic macular edema; dbh dot blot hemorrhages; CWS cotton wool spot; POAG primary open angle glaucoma; C/D cup-to-disc ratio; HVF humphrey visual field; GVF goldmann visual field; OCT optical coherence tomography; IOP intraocular pressure; BRVO Branch retinal vein occlusion; CRVO central retinal vein occlusion; CRAO central retinal artery occlusion; BRAO branch retinal artery occlusion; RT retinal tear; SB scleral buckle; PPV pars plana vitrectomy; VH Vitreous hemorrhage; PRP panretinal laser photocoagulation; IVK intravitreal kenalog; VMT vitreomacular traction; MH Macular hole;  NVD neovascularization of the disc; NVE neovascularization elsewhere; AREDS age related eye disease study; ARMD age related macular degeneration; POAG primary open angle glaucoma; EBMD epithelial/anterior basement membrane dystrophy; ACIOL anterior chamber intraocular lens; IOL intraocular lens; PCIOL posterior chamber intraocular lens; Phaco/IOL phacoemulsification with intraocular lens placement; [place] photorefractive keratectomy; LASIK laser assisted in situ keratomileusis; HTN hypertension; DM diabetes mellitus; COPD chronic obstructive pulmonary disease

## 2020-04-27 NOTE — Assessment & Plan Note (Signed)

## 2020-05-11 ENCOUNTER — Other Ambulatory Visit: Payer: Self-pay | Admitting: Cardiology

## 2020-05-20 ENCOUNTER — Other Ambulatory Visit: Payer: Self-pay | Admitting: Family Medicine

## 2020-05-25 ENCOUNTER — Other Ambulatory Visit: Payer: Self-pay

## 2020-05-25 ENCOUNTER — Other Ambulatory Visit (HOSPITAL_COMMUNITY)
Admission: RE | Admit: 2020-05-25 | Discharge: 2020-05-25 | Disposition: A | Discharge status: Home / Self Care | Payer: Medicare HMO | Source: Ambulatory Visit | Attending: Cardiology | Admitting: Cardiology

## 2020-05-25 ENCOUNTER — Ambulatory Visit (INDEPENDENT_AMBULATORY_CARE_PROVIDER_SITE_OTHER): Payer: Medicare HMO | Admitting: *Deleted

## 2020-05-25 DIAGNOSIS — I4891 Unspecified atrial fibrillation: Secondary | ICD-10-CM | POA: Insufficient documentation

## 2020-05-25 DIAGNOSIS — Z5181 Encounter for therapeutic drug level monitoring: Secondary | ICD-10-CM | POA: Diagnosis present

## 2020-05-25 LAB — CBC
HCT: 39.4 % (ref 36.0–46.0)
Hemoglobin: 12.1 g/dL (ref 12.0–15.0)
MCH: 33.2 pg (ref 26.0–34.0)
MCHC: 30.7 g/dL (ref 30.0–36.0)
MCV: 107.9 fL — ABNORMAL HIGH (ref 80.0–100.0)
Platelets: 215 10*3/uL (ref 150–400)
RBC: 3.65 MIL/uL — ABNORMAL LOW (ref 3.87–5.11)
RDW: 13.2 % (ref 11.5–15.5)
WBC: 6.1 10*3/uL (ref 4.0–10.5)
nRBC: 0 % (ref 0.0–0.2)

## 2020-05-25 LAB — BASIC METABOLIC PANEL
Anion gap: 9 (ref 5–15)
BUN: 20 mg/dL (ref 8–23)
CO2: 26 mmol/L (ref 22–32)
Calcium: 8.8 mg/dL — ABNORMAL LOW (ref 8.9–10.3)
Chloride: 104 mmol/L (ref 98–111)
Creatinine, Ser: 0.6 mg/dL (ref 0.44–1.00)
GFR calc Af Amer: >60 mL/min (ref >60)
GFR calc non Af Amer: >60 mL/min (ref >60)
Glucose, Bld: 117 mg/dL — ABNORMAL HIGH (ref 70–99)
Potassium: 3.9 mmol/L (ref 3.5–5.1)
Sodium: 139 mmol/L (ref 135–145)

## 2020-05-25 NOTE — Progress Notes (Signed)
1 month Eliquis follow up:  Pt was started on Eliquis 5mg  BID for atrial fibrillation by Dr on 04/22/20.    Pt denies any complications since starting Eliquis.  Denies bleeding, excessive bruising or GI issues.  Reviewed patients medication list.  Pt  currently on any combined P-gp and strong CYP3A4 inhibitors/inducers (ketoconazole, traconazole, ritonavir, carbamazepine, phenytoin, rifampin, St. John's wort).  Reviewed labs from [facility] lab on 05/25/20.  SCr 0.60, Weight 65.9kg, CrCl 58.35  Dose is appropriate based on age, weight, and SCr.  Hgb and HCT:  12.1/39.4  Plts 215   A full discussion of the nature of anticoagulants has been carried out.  A benefit/risk analysis has been presented to the patient, so that they understand the justification for choosing anticoagulation with Eliquis at this time.  The need for compliance is stressed.  Pt is aware to take the medication twice daily.  Side effects of potential bleeding are discussed, including unusual colored urine or stools, coughing up blood or coffee ground emesis, nose bleeds or serious fall or head trauma.  Discussed signs and symptoms of stroke. The patient should avoid any OTC items containing aspirin or ibuprofen.  Avoid alcohol consumption.   Call if any signs of abnormal bleeding.  Discussed financial obligations and resolved any difficulty in obtaining medication.  Next lab test in 6 months.   Discussed lab results with patient.  Placed in recall for 6 month f/u in Jan. 2022.

## 2020-05-29 IMAGING — DX CHEST - 2 VIEW
2 series · 2 of 2 positions shown · non-contrast
Comparison: 05/20/2019

CLINICAL DATA: Sob and wheezing. pneumonia

EXAM:
CHEST - 2 VIEW

[chest pa]
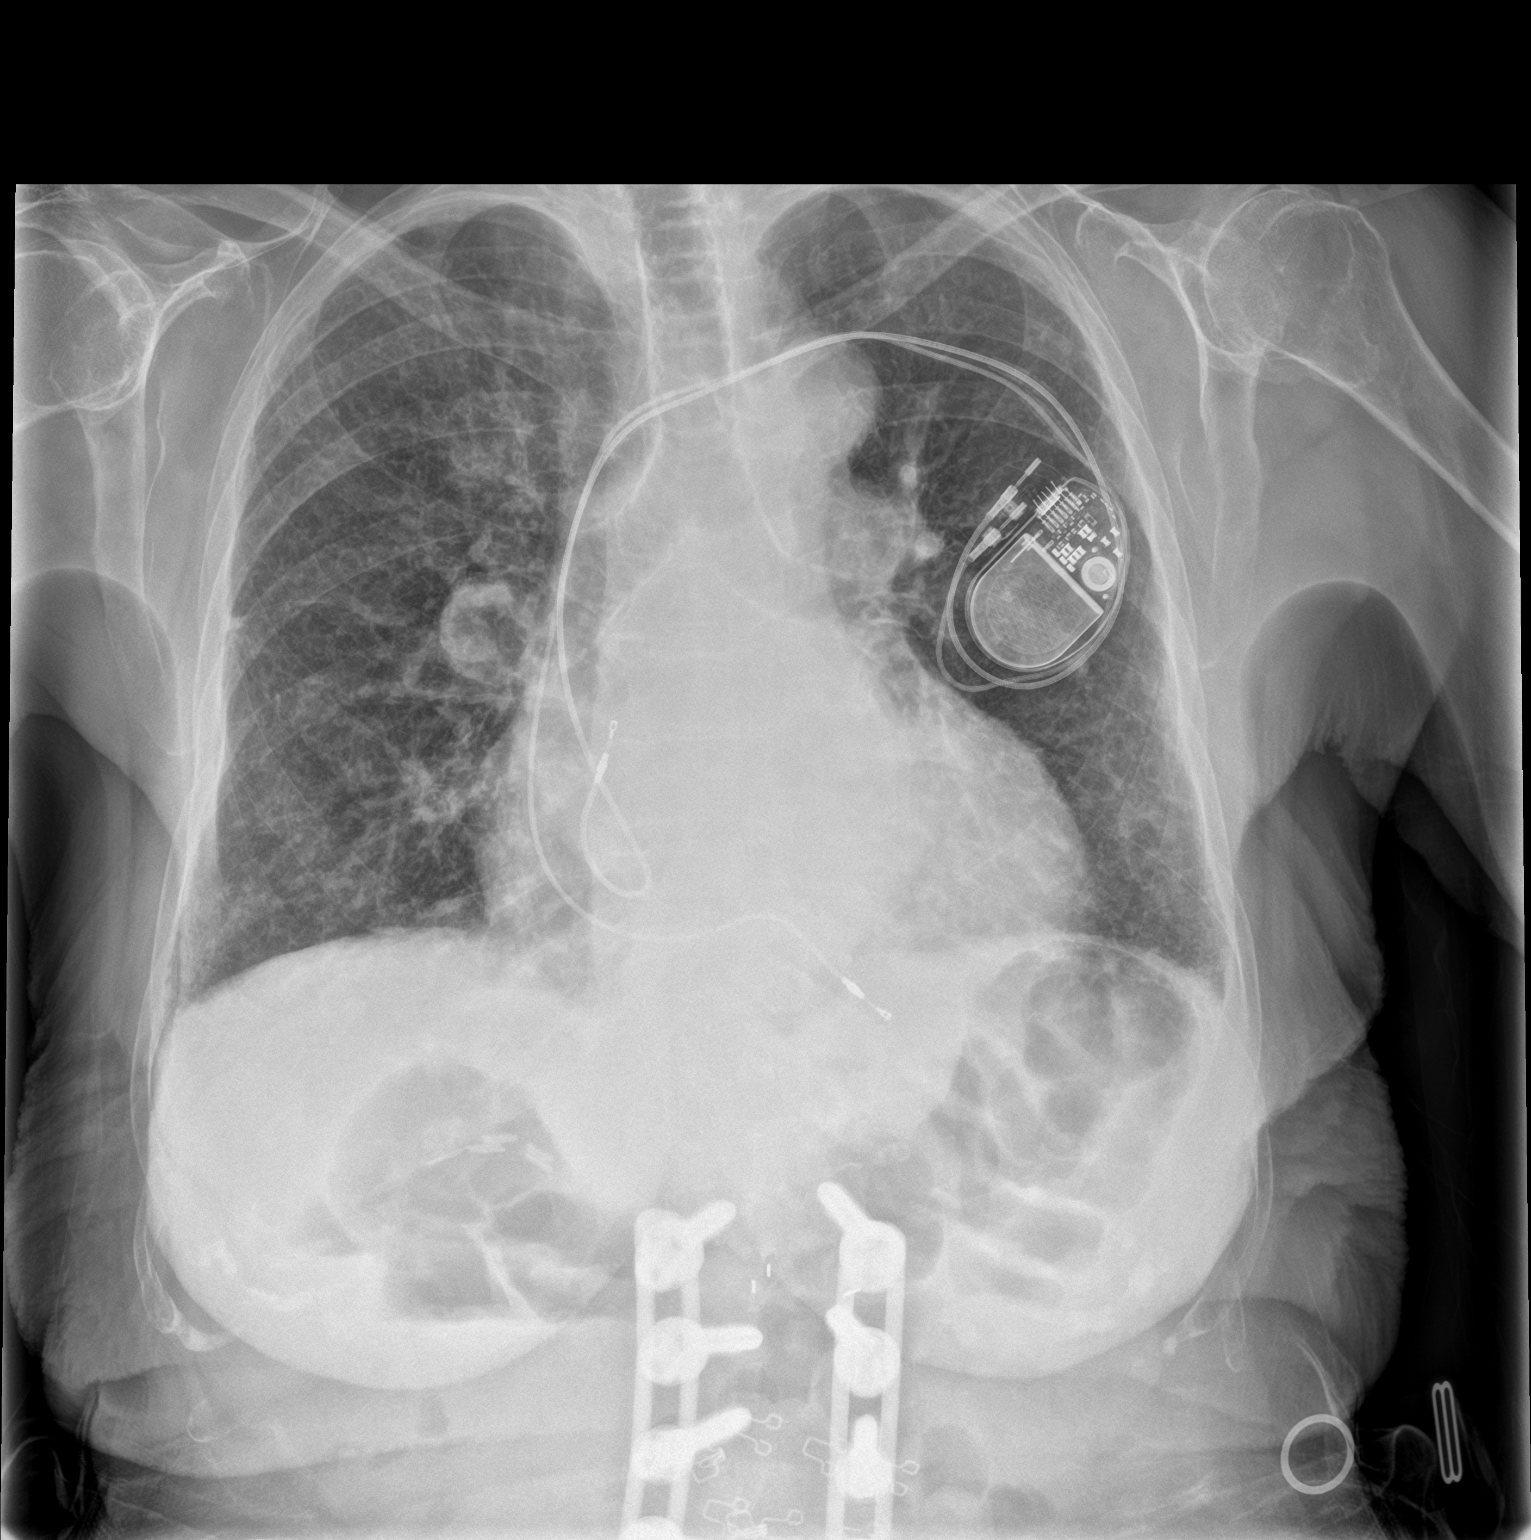

[chest lat]
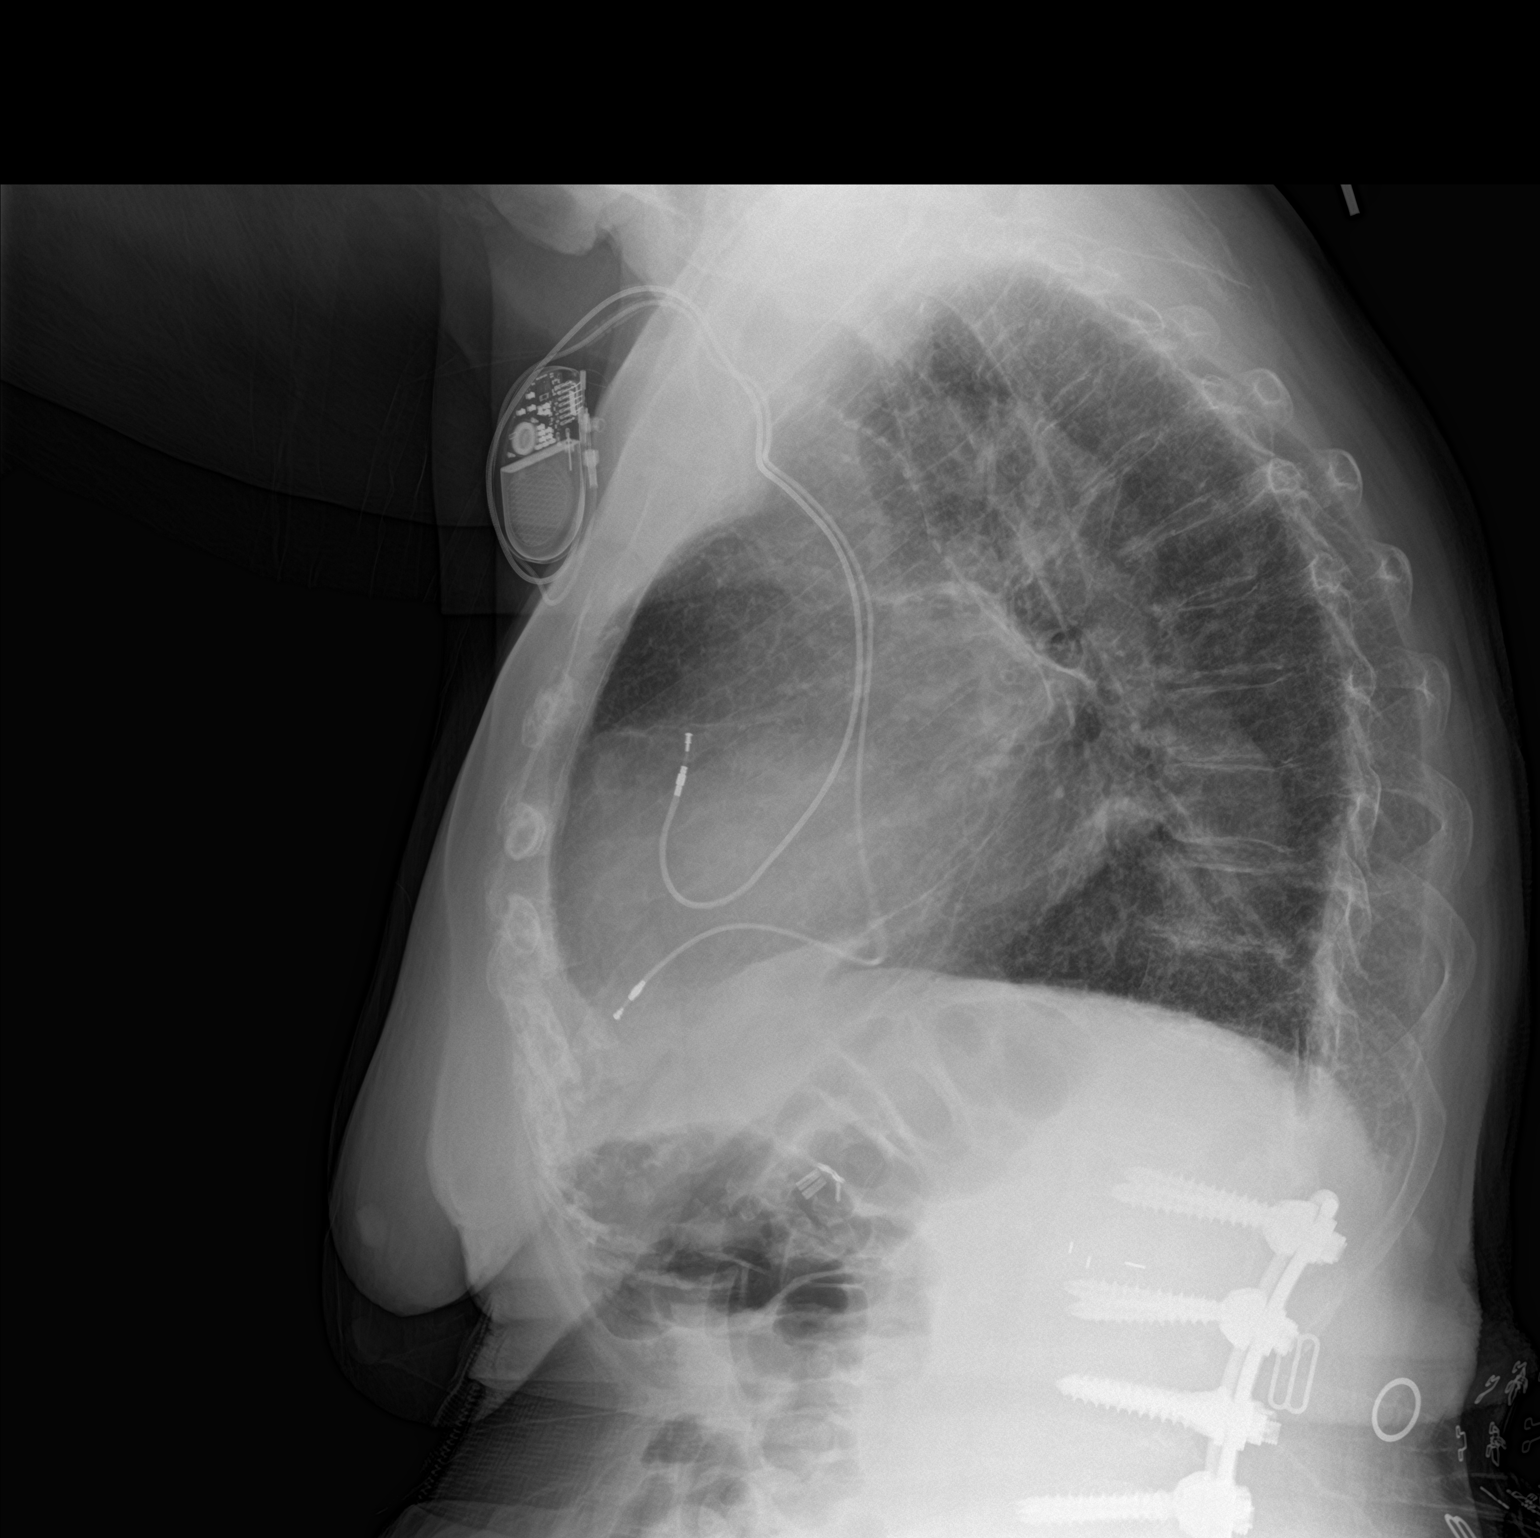

[2 of 2 positions shown; findings below may reference images not displayed]

FINDINGS: The right mid lung airspace consolidation seen previously has nearly
completely resolved. Coarse prominent interstitial markings
peripherally in the lung bases are slightly more conspicuous, but
there is no overt interstitial edema.

Heart size upper limits normal. Stable left subclavian dual lead
transvenous pacemaker.

No effusion. No pneumothorax.

Lumbar fixation hardware partially visualized. Cholecystectomy
clips.
IMPRESSION: 1. Near complete resolution of right mid lung airspace
consolidation.
2. Coarse interstitial markings.

## 2020-06-02 ENCOUNTER — Telehealth: Payer: Self-pay | Admitting: *Deleted

## 2020-06-02 NOTE — Telephone Encounter (Signed)
-----   Message from Antoine Poche, MD sent at 06/01/2020  4:33 PM EDT ----- Normal labs  Dominga Ferry MD

## 2020-06-02 NOTE — Telephone Encounter (Signed)
Mailbox full

## 2020-06-17 ENCOUNTER — Encounter: Payer: Self-pay | Admitting: *Deleted

## 2020-06-17 NOTE — Telephone Encounter (Signed)
Mailed letter °

## 2020-07-06 ENCOUNTER — Ambulatory Visit (INDEPENDENT_AMBULATORY_CARE_PROVIDER_SITE_OTHER): Payer: Medicare HMO | Admitting: *Deleted

## 2020-07-06 DIAGNOSIS — I495 Sick sinus syndrome: Secondary | ICD-10-CM | POA: Diagnosis not present

## 2020-07-07 LAB — CUP PACEART REMOTE DEVICE CHECK
Battery Remaining Longevity: 129 mo
Battery Remaining Percentage: 95.5 %
Battery Voltage: 3.01 V
Brady Statistic RV Percent Paced: 95 %
Date Time Interrogation Session: 20210825022105
Implantable Lead Implant Date: 20070518
Implantable Lead Implant Date: 20070518
Implantable Lead Location: 753859
Implantable Lead Location: 753860
Implantable Pulse Generator Implant Date: 20191003
Lead Channel Impedance Value: 680 Ohm
Lead Channel Pacing Threshold Amplitude: 0.5 V
Lead Channel Pacing Threshold Pulse Width: 0.5 ms
Lead Channel Sensing Intrinsic Amplitude: 12 mV
Lead Channel Setting Pacing Amplitude: 2.5 V
Lead Channel Setting Pacing Pulse Width: 0.5 ms
Lead Channel Setting Sensing Sensitivity: 2 mV
Pulse Gen Model: 1272
Pulse Gen Serial Number: 9067608

## 2020-07-11 NOTE — Progress Notes (Signed)
Remote pacemaker transmission.   

## 2020-08-15 ENCOUNTER — Telehealth: Payer: Self-pay | Admitting: Cardiology

## 2020-08-15 NOTE — Telephone Encounter (Signed)
Female making call for Pt; Pt is having arm pain.   Please call [phone]   Thanks renee

## 2020-08-15 NOTE — Telephone Encounter (Signed)
Patient states she woke up around 4 am to have right arm near shoulder hurting her.She got up, took some tylenol, and went back to bed.When she got this am, pain is resolved.I asked her to call back if anything changes.

## 2020-09-05 ENCOUNTER — Other Ambulatory Visit: Payer: Medicare HMO

## 2020-09-05 ENCOUNTER — Other Ambulatory Visit: Payer: Self-pay

## 2020-09-05 ENCOUNTER — Other Ambulatory Visit: Payer: Self-pay | Admitting: Family Medicine

## 2020-09-05 DIAGNOSIS — I10 Essential (primary) hypertension: Secondary | ICD-10-CM

## 2020-09-05 DIAGNOSIS — R6889 Other general symptoms and signs: Secondary | ICD-10-CM

## 2020-09-05 DIAGNOSIS — Z13220 Encounter for screening for lipoid disorders: Secondary | ICD-10-CM

## 2020-09-05 DIAGNOSIS — D539 Nutritional anemia, unspecified: Secondary | ICD-10-CM

## 2020-09-05 NOTE — Progress Notes (Signed)
Labs ordered.

## 2020-09-06 LAB — CBC WITH DIFFERENTIAL/PLATELET
Absolute Monocytes: 657 cells/uL (ref 200–950)
Basophils Absolute: 51 cells/uL (ref 0–200)
Basophils Relative: 0.7 %
Eosinophils Absolute: 263 cells/uL (ref 15–500)
Eosinophils Relative: 3.6 %
HCT: 41.3 % (ref 35.0–45.0)
Hemoglobin: 13.3 g/dL (ref 11.7–15.5)
Lymphs Abs: 1482 cells/uL (ref 850–3900)
MCH: 33.3 pg — ABNORMAL HIGH (ref 27.0–33.0)
MCHC: 32.2 g/dL (ref 32.0–36.0)
MCV: 103.5 fL — ABNORMAL HIGH (ref 80.0–100.0)
MPV: 12.7 fL — ABNORMAL HIGH (ref 7.5–12.5)
Monocytes Relative: 9 %
Neutro Abs: 4847 cells/uL (ref 1500–7800)
Neutrophils Relative %: 66.4 %
Platelets: 214 10*3/uL (ref 140–400)
RBC: 3.99 10*6/uL (ref 3.80–5.10)
RDW: 11.8 % (ref 11.0–15.0)
Total Lymphocyte: 20.3 %
WBC: 7.3 10*3/uL (ref 3.8–10.8)

## 2020-09-06 LAB — BASIC METABOLIC PANEL
BUN: 13 mg/dL (ref 7–25)
CO2: 27 mmol/L (ref 20–32)
Calcium: 9.2 mg/dL (ref 8.6–10.4)
Chloride: 99 mmol/L (ref 98–110)
Creat: 0.76 mg/dL (ref 0.60–0.88)
Glucose, Bld: 97 mg/dL (ref 65–99)
Potassium: 5.2 mmol/L (ref 3.5–5.3)
Sodium: 138 mmol/L (ref 135–146)

## 2020-09-06 LAB — VITAMIN B12: Vitamin B-12: 260 pg/mL (ref 200–1100)

## 2020-09-08 ENCOUNTER — Ambulatory Visit: Payer: Medicare HMO | Admitting: Nurse Practitioner

## 2020-09-08 ENCOUNTER — Other Ambulatory Visit: Payer: Self-pay

## 2020-09-08 ENCOUNTER — Ambulatory Visit (INDEPENDENT_AMBULATORY_CARE_PROVIDER_SITE_OTHER): Payer: Medicare HMO | Admitting: Family Medicine

## 2020-09-08 ENCOUNTER — Encounter: Payer: Self-pay | Admitting: Family Medicine

## 2020-09-08 VITALS — BP 130/96 | HR 64 | Temp 98.0°F | Resp 18 | Ht 65.0 in | Wt 139.0 lb

## 2020-09-08 DIAGNOSIS — I4819 Other persistent atrial fibrillation: Secondary | ICD-10-CM

## 2020-09-08 DIAGNOSIS — I10 Essential (primary) hypertension: Secondary | ICD-10-CM | POA: Diagnosis not present

## 2020-09-08 DIAGNOSIS — Z23 Encounter for immunization: Secondary | ICD-10-CM

## 2020-09-08 DIAGNOSIS — Z950 Presence of cardiac pacemaker: Secondary | ICD-10-CM

## 2020-09-08 MED ORDER — APIXABAN 5 MG PO TABS
5.0000 mg | ORAL_TABLET | Freq: Two times a day (BID) | ORAL | 6 refills | Status: DC
Start: 2020-09-08 — End: 2021-05-03

## 2020-09-08 NOTE — Progress Notes (Signed)
Subjective:    Patient ID: [name], female    DOB: [birth year], 86 y.o.   MRN: [id number]  HPI Patient is a 86 year old Caucasian female here today to establish care with me.  She has a history of atrial fibrillation.  She is currently on Eliquis 5 mg twice daily.  She also has a history of hypertension for which she takes lisinopril.  She is rate controlled on metoprolol 25 mg twice daily.  She denies any chest pain shortness of breath or dyspnea on exertion.  She denies any syncope or near syncope.  She denies any strokelike symptoms.  She is due for a flu shot along with a booster on her Covid vaccine.  Otherwise she is doing well with no concerns.  She is moving her bowels every day.  She denies any dysuria or urgency or frequency or hematuria.  She denies any melena or hematochezia. Past Medical History:  Diagnosis Date  . Arthritis   . Atrial fibrillation (HCC)   . COPD (chronic obstructive pulmonary disease) (HCC)   . Hip pain, left   . Hypertension   . Sinoatrial node dysfunction (HCC)    Past Surgical History:  Procedure Laterality Date  . BACK SURGERY    . EYE SURGERY Bilateral    cataract removal  . hip arthoplasty     total  . KNEE ARTHROPLASTY    . PPM GENERATOR CHANGEOUT N/A 08/14/2018   Procedure: PPM GENERATOR CHANGEOUT;  Surgeon: Marinus Maw, MD;  Location: [facility] INVASIVE CV LAB;  Service: Cardiovascular;  Laterality: N/A;  . uterine polyp removal     Current Outpatient Medications on File Prior to Visit  Medication Sig Dispense Refill  . ELIQUIS 5 MG TABS tablet TAKE 1 TABLET BY MOUTH TWICE A DAY 60 tablet 6  . furosemide (LASIX) 40 MG tablet TAKE 1 TABLET BY MOUTH EVERY DAY AS NEEDED 90 tablet 3  . lisinopril (PRINIVIL,ZESTRIL) 40 MG tablet TAKE 1 TABLET BY MOUTH EVERY DAY 90 tablet 3  . metoprolol tartrate (LOPRESSOR) 25 MG tablet TAKE 1 TABLET BY MOUTH TWICE A DAY 180 tablet 3  . Multiple Vitamin (MULTIVITAMIN) tablet Take 1 tablet by mouth daily.       No current facility-administered medications on file prior to visit.   No Known Allergies Social History   Socioeconomic History  . Marital status: Widowed    Spouse name: Not on file  . Number of children: Not on file  . Years of education: Not on file  . Highest education level: Not on file  Occupational History  . Occupation: Retired    Associate Professor: RETIRED  Tobacco Use  . Smoking status: Never Smoker  . Smokeless tobacco: Never Used  Vaping Use  . Vaping Use: Never used  Substance and Sexual Activity  . Alcohol use: No    Alcohol/week: 0.0 standard drinks  . Drug use: No  . Sexual activity: Not Currently    Birth control/protection: Post-menopausal, None  Other Topics Concern  . Not on file  Social History Narrative  . Not on file   Social Determinants of Health   Financial Resource Strain:   . Difficulty of Paying Living Expenses: Not on file  Food Insecurity:   . Worried About Programme researcher, broadcasting/film/video in the Last Year: Not on file  . Ran Out of Food in the Last Year: Not on file  Transportation Needs:   . Lack of Transportation (Medical): Not on file  . Lack of  Transportation (Non-Medical): Not on file  Physical Activity:   . Days of Exercise per Week: Not on file  . Minutes of Exercise per Session: Not on file  Stress:   . Feeling of Stress : Not on file  Social Connections:   . Frequency of Communication with Friends and Family: Not on file  . Frequency of Social Gatherings with Friends and Family: Not on file  . Attends Religious Services: Not on file  . Active Member of Clubs or Organizations: Not on file  . Attends Banker Meetings: Not on file  . Marital Status: Not on file  Intimate Partner Violence:   . Fear of Current or Ex-Partner: Not on file  . Emotionally Abused: Not on file  . Physically Abused: Not on file  . Sexually Abused: Not on file     Review of Systems  All other systems reviewed and are negative.      Objective:    Physical Exam Constitutional:      Appearance: Normal appearance. She is not ill-appearing or toxic-appearing.  Cardiovascular:     Rate and Rhythm: Normal rate. Rhythm irregular.     Heart sounds: Normal heart sounds. No murmur heard.  No friction rub. No gallop.   Pulmonary:     Effort: Pulmonary effort is normal. No respiratory distress.     Breath sounds: Normal breath sounds. No stridor. No wheezing, rhonchi or rales.  Chest:     Chest wall: No tenderness.  Abdominal:     General: Abdomen is flat. Bowel sounds are normal. There is no distension.     Palpations: Abdomen is soft.     Tenderness: There is no abdominal tenderness. There is no guarding or rebound.     Hernia: No hernia is present.  Musculoskeletal:     Right lower leg: No edema.     Left lower leg: No edema.  Neurological:     Mental Status: She is alert.           Assessment & Plan:  Need for immunization against influenza - Plan: Flu Vaccine QUAD High Dose(Fluad)  Essential hypertension  Persistent atrial fibrillation (HCC)  PACEMAKER, PERMANENT  Overall seems to be doing extremely well.  Blood pressure today is adequately controlled.  Heart rate is adequately controlled with pacemaker.  She is in normal sinus rhythm today but I believe this is pacemaker dependent.  She is appropriately anticoagulated with Eliquis.  Lab work was recently drawn and is shown below: Orders Only on 09/05/2020  Component Date Value Ref Range Status  . WBC 09/05/2020 7.3  3.8 - 10.8 Thousand/uL Final  . RBC 09/05/2020 3.99  3.80 - 5.10 Million/uL Final  . Hemoglobin 09/05/2020 13.3  11.7 - 15.5 g/dL Final  . HCT 60/45/4098 41.3  35 - 45 % Final  . MCV 09/05/2020 103.5* 80.0 - 100.0 fL Final  . MCH 09/05/2020 33.3* 27.0 - 33.0 pg Final  . MCHC 09/05/2020 32.2  32.0 - 36.0 g/dL Final  . RDW 11/91/4782 11.8  11.0 - 15.0 % Final  . Platelets 09/05/2020 214  140 - 400 Thousand/uL Final  . MPV 09/05/2020 12.7* 7.5 - 12.5 fL  Final  . Neutro Abs 09/05/2020 4,847  1,500 - 7,800 cells/uL Final  . Lymphs Abs 09/05/2020 1,482  850 - 3,900 cells/uL Final  . Absolute Monocytes 09/05/2020 657  200 - 950 cells/uL Final  . Eosinophils Absolute 09/05/2020 263  15.0 - 500.0 cells/uL Final  . Basophils Absolute  09/05/2020 51  0.0 - 200.0 cells/uL Final  . Neutrophils Relative % 09/05/2020 66.4  % Final  . Total Lymphocyte 09/05/2020 20.3  % Final  . Monocytes Relative 09/05/2020 9.0  % Final  . Eosinophils Relative 09/05/2020 3.6  % Final  . Basophils Relative 09/05/2020 0.7  % Final  . Glucose, Bld 09/05/2020 97  65 - 99 mg/dL Final   Comment: .            Fasting reference interval .   . BUN 09/05/2020 13  7 - 25 mg/dL Final  . Creat 16/08/9603 0.76  0.60 - 0.88 mg/dL Final   Comment: For patients >21 years of age, the reference limit for Creatinine is approximately 13% higher for people identified as African-American. .   Edwena Felty Ratio 09/05/2020 NOT APPLICABLE  6 - 22 (calc) Final  . Sodium 09/05/2020 138  135 - 146 mmol/L Final  . Potassium 09/05/2020 5.2  3.5 - 5.3 mmol/L Final  . Chloride 09/05/2020 99  98 - 110 mmol/L Final  . CO2 09/05/2020 27  20 - 32 mmol/L Final  . Calcium 09/05/2020 9.2  8.6 - 10.4 mg/dL Final  . Vitamin V-40 98/09/9146 260  200 - 1,100 pg/mL Final   Comment: . Please Note: Although the reference range for vitamin B12 is [phone] pg/mL, it has been reported that between 5 and 10% of patients with values between 200 and 400 pg/mL may experience neuropsychiatric and hematologic abnormalities due to occult B12 deficiency; less than 1% of patients with values above 400 pg/mL will have symptoms. .    Lab work is outstanding.  Patient received her flu shot and I recommended a booster on her Covid vaccine.  Follow-up in 6 months or as needed

## 2020-10-05 ENCOUNTER — Ambulatory Visit (INDEPENDENT_AMBULATORY_CARE_PROVIDER_SITE_OTHER): Payer: Medicare HMO

## 2020-10-05 DIAGNOSIS — I495 Sick sinus syndrome: Secondary | ICD-10-CM | POA: Diagnosis not present

## 2020-10-05 LAB — CUP PACEART REMOTE DEVICE CHECK
Battery Remaining Longevity: 128 mo
Battery Remaining Percentage: 95.5 %
Battery Voltage: 3.01 V
Brady Statistic RV Percent Paced: 95 %
Date Time Interrogation Session: 20211124020023
Implantable Lead Implant Date: 20070518
Implantable Lead Implant Date: 20070518
Implantable Lead Location: 753859
Implantable Lead Location: 753860
Implantable Pulse Generator Implant Date: 20191003
Lead Channel Impedance Value: 640 Ohm
Lead Channel Pacing Threshold Amplitude: 0.5 V
Lead Channel Pacing Threshold Pulse Width: 0.5 ms
Lead Channel Sensing Intrinsic Amplitude: 12 mV
Lead Channel Setting Pacing Amplitude: 2.5 V
Lead Channel Setting Pacing Pulse Width: 0.5 ms
Lead Channel Setting Sensing Sensitivity: 2 mV
Pulse Gen Model: 1272
Pulse Gen Serial Number: 9067608

## 2020-10-14 NOTE — Progress Notes (Signed)
Remote pacemaker transmission.   

## 2020-11-01 ENCOUNTER — Ambulatory Visit: Payer: Medicare HMO | Admitting: Cardiology

## 2020-11-30 ENCOUNTER — Encounter: Payer: Medicare HMO | Admitting: Internal Medicine

## 2020-12-13 ENCOUNTER — Encounter: Payer: Medicare HMO | Admitting: Internal Medicine

## 2020-12-22 ENCOUNTER — Ambulatory Visit: Payer: Medicare HMO | Admitting: Cardiology

## 2020-12-22 ENCOUNTER — Encounter: Payer: Self-pay | Admitting: Cardiology

## 2020-12-22 ENCOUNTER — Other Ambulatory Visit: Payer: Self-pay

## 2020-12-22 VITALS — BP 130/80 | HR 69 | Ht 65.0 in | Wt 142.4 lb

## 2020-12-22 DIAGNOSIS — I10 Essential (primary) hypertension: Secondary | ICD-10-CM | POA: Diagnosis not present

## 2020-12-22 DIAGNOSIS — I495 Sick sinus syndrome: Secondary | ICD-10-CM | POA: Diagnosis not present

## 2020-12-22 DIAGNOSIS — I4891 Unspecified atrial fibrillation: Secondary | ICD-10-CM

## 2020-12-22 NOTE — Progress Notes (Signed)
Clinical Summary [name] is a 87 y.o.female seen today for follow up of the following medical problems.    1. Afib - has not been interested in NOACs.No bleeding issues on coumadin    - no recent palpitations - compliant with meds. NO bleeding on eliquis  2. Tachy-Brady Syndrome  - has permanent pacemaker followed by EP Dr Ladona Ridgel  - no recent symptoms - EP appt next week for devicen check   3. HTN  -We have been accepting high bp's for her due to prior issues with falls - compliant with meds   4. COVID 19 in July 2020   Has had covid vaccine x 3.   SH: retired cardiology nurse at [employer]   Past Medical History:  Diagnosis Date  . Arthritis   . Atrial fibrillation (HCC)   . COPD (chronic obstructive pulmonary disease) (HCC)   . Hip pain, left   . Hypertension   . Sinoatrial node dysfunction (HCC)      No Known Allergies   Current Outpatient Medications  Medication Sig Dispense Refill  . apixaban (ELIQUIS) 5 MG TABS tablet Take 1 tablet (5 mg total) by mouth 2 (two) times daily. 60 tablet 6  . furosemide (LASIX) 40 MG tablet TAKE 1 TABLET BY MOUTH EVERY DAY AS NEEDED 90 tablet 3  . lisinopril (PRINIVIL,ZESTRIL) 40 MG tablet TAKE 1 TABLET BY MOUTH EVERY DAY 90 tablet 3  . metoprolol tartrate (LOPRESSOR) 25 MG tablet TAKE 1 TABLET BY MOUTH TWICE A DAY 180 tablet 3  . Multiple Vitamin (MULTIVITAMIN) tablet Take 1 tablet by mouth daily.      No current facility-administered medications for this visit.     Past Surgical History:  Procedure Laterality Date  . BACK SURGERY    . EYE SURGERY Bilateral    cataract removal  . hip arthoplasty     total  . KNEE ARTHROPLASTY    . PPM GENERATOR CHANGEOUT N/A 08/14/2018   Procedure: PPM GENERATOR CHANGEOUT;  Surgeon: Marinus Maw, MD;  Location: [facility] INVASIVE CV LAB;  Service: Cardiovascular;  Laterality: N/A;  . uterine polyp removal       No Known Allergies    Family History   Problem Relation Age of Onset  . Prostate cancer Father   . Heart failure Father   . Heart disease Father   . Stroke Sister   . Depression Maternal Grandmother      Social History [name] reports that she has never smoked. She has never used smokeless tobacco. [name] reports no history of alcohol use.   Review of Systems CONSTITUTIONAL: No weight loss, fever, chills, weakness or fatigue.  HEENT: Eyes: No visual loss, blurred vision, double vision or yellow sclerae.No hearing loss, sneezing, congestion, runny nose or sore throat.  SKIN: No rash or itching.  CARDIOVASCULAR: per hpi RESPIRATORY: No shortness of breath, cough or sputum.  GASTROINTESTINAL: No anorexia, nausea, vomiting or diarrhea. No abdominal pain or blood.  GENITOURINARY: No burning on urination, no polyuria NEUROLOGICAL: No headache, dizziness, syncope, paralysis, ataxia, numbness or tingling in the extremities. No change in bowel or bladder control.  MUSCULOSKELETAL: No muscle, back pain, joint pain or stiffness.  LYMPHATICS: No enlarged nodes. No history of splenectomy.  PSYCHIATRIC: No history of depression or anxiety.  ENDOCRINOLOGIC: No reports of sweating, cold or heat intolerance. No polyuria or polydipsia.  Marland Kitchen   Physical Examination Today's Vitals   12/22/20 0919  BP: 130/80  Pulse: 69  SpO2: 96%  Weight: 142 lb 6.4 oz (64.6 kg)  Height: 5\' 5"  (1.651 m)   Body mass index is 23.7 kg/m.  Gen: resting comfortably, no acute distress HEENT: no scleral icterus, pupils equal round and reactive, no palptable cervical adenopathy,  CV: RRR, no m/r/g, no jvd Resp: Clear to auscultation bilaterally GI: abdomen is soft, non-tender, non-distended, normal bowel sounds, no hepatosplenomegaly MSK: extremities are warm, no edema.  Skin: warm, no rash Neuro:  no focal deficits Psych: appropriate affect   Diagnostic Studies     Assessment and Plan  1. Afib  - no symptoms, continue current  meds  2. Tachy-brady syndrome  - no symptoms, has device check with EP next week  3. HTN  -tolerating higher goal bp's due to advanced age and prior falls - bp's look good today, cotinue current meds  F/u 6 months     , M.D.

## 2020-12-22 NOTE — Patient Instructions (Signed)
Medication Instructions:  Your physician recommends that you continue on your current medications as directed. Please refer to the Current Medication list given to you today.  *If you need a refill on your cardiac medications before your next appointment, please call your pharmacy*   Lab Work: NONE   If you have labs (blood work) drawn today and your tests are completely normal, you will receive your results only by: . MyChart Message (if you have MyChart) OR . A paper copy in the mail If you have any lab test that is abnormal or we need to change your treatment, we will call you to review the results.   Testing/Procedures: NONE    Follow-Up: At [facility], you and your health needs are our priority.  As part of our continuing mission to provide you with exceptional heart care, we have created designated Provider Care Teams.  These Care Teams include your primary Cardiologist (physician) and Advanced Practice Providers (APPs -  Physician Assistants and Nurse Practitioners) who all work together to provide you with the care you need, when you need it.  We recommend signing up for the patient portal called "MyChart".  Sign up information is provided on this After Visit Summary.  MyChart is used to connect with patients for Virtual Visits (Telemedicine).  Patients are able to view lab/test results, encounter notes, upcoming appointments, etc.  Non-urgent messages can be sent to your provider as well.   To learn more about what you can do with MyChart, go to [email or web address].    Your next appointment:   6 month(s)  The format for your next appointment:   In Person  Provider:   Jonathan Branch, MD   Other Instructions Thank you for choosing [facility]!    

## 2020-12-27 ENCOUNTER — Encounter: Payer: Self-pay | Admitting: Internal Medicine

## 2020-12-27 ENCOUNTER — Other Ambulatory Visit: Payer: Self-pay

## 2020-12-27 ENCOUNTER — Ambulatory Visit (INDEPENDENT_AMBULATORY_CARE_PROVIDER_SITE_OTHER): Payer: Medicare HMO | Admitting: Internal Medicine

## 2020-12-27 VITALS — BP 130/62 | HR 72 | Ht 65.0 in | Wt 146.0 lb

## 2020-12-27 DIAGNOSIS — I4891 Unspecified atrial fibrillation: Secondary | ICD-10-CM | POA: Diagnosis not present

## 2020-12-27 LAB — CUP PACEART INCLINIC DEVICE CHECK
Battery Remaining Longevity: 123 mo
Battery Voltage: 3.01 V
Brady Statistic RV Percent Paced: 96 %
Date Time Interrogation Session: 20220215144628
Implantable Lead Implant Date: 20070518
Implantable Lead Implant Date: 20070518
Implantable Lead Location: 753859
Implantable Lead Location: 753860
Implantable Pulse Generator Implant Date: 20191003
Lead Channel Impedance Value: 637.5 Ohm
Lead Channel Pacing Threshold Amplitude: 0.5 V
Lead Channel Pacing Threshold Pulse Width: 0.5 ms
Lead Channel Sensing Intrinsic Amplitude: 12 mV
Lead Channel Setting Pacing Amplitude: 2.5 V
Lead Channel Setting Pacing Pulse Width: 0.5 ms
Lead Channel Setting Sensing Sensitivity: 2 mV
Pulse Gen Model: 1272
Pulse Gen Serial Number: 9067608

## 2020-12-27 NOTE — Patient Instructions (Signed)
Medication Instructions:  Your physician recommends that you continue on your current medications as directed. Please refer to the Current Medication list given to you today.  *If you need a refill on your cardiac medications before your next appointment, please call your pharmacy*   Lab Work: NONE   If you have labs (blood work) drawn today and your tests are completely normal, you will receive your results only by: . MyChart Message (if you have MyChart) OR . A paper copy in the mail If you have any lab test that is abnormal or we need to change your treatment, we will call you to review the results.   Testing/Procedures: NONE    Follow-Up: At [facility], you and your health needs are our priority.  As part of our continuing mission to provide you with exceptional heart care, we have created designated Provider Care Teams.  These Care Teams include your primary Cardiologist (physician) and Advanced Practice Providers (APPs -  Physician Assistants and Nurse Practitioners) who all work together to provide you with the care you need, when you need it.  We recommend signing up for the patient portal called "MyChart".  Sign up information is provided on this After Visit Summary.  MyChart is used to connect with patients for Virtual Visits (Telemedicine).  Patients are able to view lab/test results, encounter notes, upcoming appointments, etc.  Non-urgent messages can be sent to your provider as well.   To learn more about what you can do with MyChart, go to [email or web address].    Your next appointment:   1 year(s)  The format for your next appointment:   In Person  Provider:   Gregg Taylor, MD   Other Instructions Thank you for choosing [facility]!    

## 2020-12-27 NOTE — Progress Notes (Signed)
HPI [name] returns today for followup. She has a h/o diastolic heart failure, chronic atrial fib and CHB, s/p PPM insertion. She got Covid 19 over a year ago but has recovered. No chest pain. Her dyspnea is back to baseline. She is taking lasix prn. No edema. Her appetite is good.   No Known Allergies   Current Outpatient Medications  Medication Sig Dispense Refill  . apixaban (ELIQUIS) 5 MG TABS tablet Take 1 tablet (5 mg total) by mouth 2 (two) times daily. 60 tablet 6  . furosemide (LASIX) 40 MG tablet TAKE 1 TABLET BY MOUTH EVERY DAY AS NEEDED 90 tablet 3  . lisinopril (PRINIVIL,ZESTRIL) 40 MG tablet TAKE 1 TABLET BY MOUTH EVERY DAY 90 tablet 3  . metoprolol tartrate (LOPRESSOR) 25 MG tablet TAKE 1 TABLET BY MOUTH TWICE A DAY 180 tablet 3  . Multiple Vitamin (MULTIVITAMIN) tablet Take 1 tablet by mouth daily.      No current facility-administered medications for this visit.     Past Medical History:  Diagnosis Date  . Arthritis   . Atrial fibrillation (HCC)   . COPD (chronic obstructive pulmonary disease) (HCC)   . Hip pain, left   . Hypertension   . Sinoatrial node dysfunction (HCC)     ROS:   All systems reviewed and negative except as noted in the HPI.   Past Surgical History:  Procedure Laterality Date  . BACK SURGERY    . EYE SURGERY Bilateral    cataract removal  . hip arthoplasty     total  . KNEE ARTHROPLASTY    . PPM GENERATOR CHANGEOUT N/A 08/14/2018   Procedure: PPM GENERATOR CHANGEOUT;  Surgeon: Marinus Maw, MD;  Location: [facility] INVASIVE CV LAB;  Service: Cardiovascular;  Laterality: N/A;  . uterine polyp removal       Family History  Problem Relation Age of Onset  . Prostate cancer Father   . Heart failure Father   . Heart disease Father   . Stroke Sister   . Depression Maternal Grandmother      Social History   Socioeconomic History  . Marital status: Widowed    Spouse name: Not on file  . Number of children: Not on file  .  Years of education: Not on file  . Highest education level: Not on file  Occupational History  . Occupation: Retired    Associate Professor: RETIRED  Tobacco Use  . Smoking status: Never Smoker  . Smokeless tobacco: Never Used  Vaping Use  . Vaping Use: Never used  Substance and Sexual Activity  . Alcohol use: No    Alcohol/week: 0.0 standard drinks  . Drug use: No  . Sexual activity: Not Currently    Birth control/protection: Post-menopausal, None  Other Topics Concern  . Not on file  Social History Narrative  . Not on file   Social Determinants of Health   Financial Resource Strain: Not on file  Food Insecurity: Not on file  Transportation Needs: Not on file  Physical Activity: Not on file  Stress: Not on file  Social Connections: Not on file  Intimate Partner Violence: Not on file     BP 130/62   Pulse 72   Ht 5\' 5"  (1.651 m)   Wt 146 lb (66.2 kg)   SpO2 98%   BMI 24.30 kg/m   Physical Exam:  elderly appearing NAD HEENT: Unremarkable Neck:  No JVD, no thyromegally Lymphatics:  No adenopathy Back:  No CVA tenderness  Lungs:  Clear with no wheezes HEART:  Regular rate rhythm, no murmurs, no rubs, no clicks Abd:  soft, positive bowel sounds, no organomegally, no rebound, no guarding Ext:  2 plus pulses, no edema, no cyanosis, no clubbing Skin:  No rashes no nodules Neuro:  CN II through XII intact, motor grossly intact  EKG - probable atrial fib with ventricular pacing  DEVICE  Normal device function.  See PaceArt for details.   Assess/Plan: 1. CHB - she is asymptomatic, s/p PPM insertion. 2. PPM - her [employer] PPM is programmed VVIR. Her device is working normally. 3. Chronic diastolic heart failure -her symptoms remain class 2. She does not appear volume overloaded. 4. Atrial fib - she has no escape today. Her rates are controlled. She will continue her eliquis.  Sharlot Gowda Polina Burmaster,MD

## 2020-12-28 ENCOUNTER — Telehealth: Payer: Self-pay | Admitting: Internal Medicine

## 2020-12-28 NOTE — Telephone Encounter (Signed)
Pt's son called with a question in regards to upcoming home remote appt. I verified with the device clinic her transmission automatically sends in her reading while sleeping. Pt's son verbalized understanding.

## 2021-01-04 ENCOUNTER — Ambulatory Visit (INDEPENDENT_AMBULATORY_CARE_PROVIDER_SITE_OTHER): Payer: Medicare HMO

## 2021-01-04 DIAGNOSIS — I495 Sick sinus syndrome: Secondary | ICD-10-CM

## 2021-01-05 LAB — CUP PACEART REMOTE DEVICE CHECK
Battery Remaining Longevity: 127 mo
Battery Remaining Percentage: 95.5 %
Battery Voltage: 3.01 V
Brady Statistic RV Percent Paced: 96 %
Date Time Interrogation Session: 20220223020021
Implantable Lead Implant Date: 20070518
Implantable Lead Implant Date: 20070518
Implantable Lead Location: 753859
Implantable Lead Location: 753860
Implantable Pulse Generator Implant Date: 20191003
Lead Channel Impedance Value: 610 Ohm
Lead Channel Pacing Threshold Amplitude: 0.5 V
Lead Channel Pacing Threshold Pulse Width: 0.5 ms
Lead Channel Sensing Intrinsic Amplitude: 12 mV
Lead Channel Setting Pacing Amplitude: 2.5 V
Lead Channel Setting Pacing Pulse Width: 0.5 ms
Lead Channel Setting Sensing Sensitivity: 2 mV
Pulse Gen Model: 1272
Pulse Gen Serial Number: 9067608

## 2021-01-07 ENCOUNTER — Other Ambulatory Visit: Payer: Self-pay | Admitting: Family Medicine

## 2021-01-11 NOTE — Progress Notes (Signed)
Remote pacemaker transmission.   

## 2021-02-14 ENCOUNTER — Ambulatory Visit: Payer: Medicare HMO | Admitting: Cardiology

## 2021-04-05 ENCOUNTER — Ambulatory Visit (INDEPENDENT_AMBULATORY_CARE_PROVIDER_SITE_OTHER): Payer: Medicare Other

## 2021-04-05 DIAGNOSIS — I495 Sick sinus syndrome: Secondary | ICD-10-CM

## 2021-04-06 LAB — CUP PACEART REMOTE DEVICE CHECK
Battery Remaining Longevity: 129 mo
Battery Remaining Percentage: 95.5 %
Battery Voltage: 3.01 V
Brady Statistic RV Percent Paced: 97 %
Date Time Interrogation Session: 20220525020014
Implantable Lead Implant Date: 20070518
Implantable Lead Implant Date: 20070518
Implantable Lead Location: 753859
Implantable Lead Location: 753860
Implantable Pulse Generator Implant Date: 20191003
Lead Channel Impedance Value: 680 Ohm
Lead Channel Pacing Threshold Amplitude: 0.5 V
Lead Channel Pacing Threshold Pulse Width: 0.5 ms
Lead Channel Sensing Intrinsic Amplitude: 12 mV
Lead Channel Setting Pacing Amplitude: 2.5 V
Lead Channel Setting Pacing Pulse Width: 0.5 ms
Lead Channel Setting Sensing Sensitivity: 2 mV
Pulse Gen Model: 1272
Pulse Gen Serial Number: 9067608

## 2021-04-24 ENCOUNTER — Other Ambulatory Visit: Payer: Self-pay | Admitting: *Deleted

## 2021-04-24 MED ORDER — LISINOPRIL 40 MG PO TABS: 40.0000 mg | ORAL_TABLET | Freq: Every day | ORAL | 0 refills | Status: DC

## 2021-04-26 NOTE — Progress Notes (Signed)
Remote pacemaker transmission.   

## 2021-04-27 ENCOUNTER — Encounter (INDEPENDENT_AMBULATORY_CARE_PROVIDER_SITE_OTHER): Payer: Self-pay | Admitting: Ophthalmology

## 2021-04-27 ENCOUNTER — Ambulatory Visit (INDEPENDENT_AMBULATORY_CARE_PROVIDER_SITE_OTHER): Payer: Medicare Other | Admitting: Ophthalmology

## 2021-04-27 ENCOUNTER — Other Ambulatory Visit: Payer: Self-pay

## 2021-04-27 DIAGNOSIS — H353122 Nonexudative age-related macular degeneration, left eye, intermediate dry stage: Secondary | ICD-10-CM | POA: Diagnosis not present

## 2021-04-27 DIAGNOSIS — H43813 Vitreous degeneration, bilateral: Secondary | ICD-10-CM | POA: Insufficient documentation

## 2021-04-27 DIAGNOSIS — H353114 Nonexudative age-related macular degeneration, right eye, advanced atrophic with subfoveal involvement: Secondary | ICD-10-CM | POA: Diagnosis not present

## 2021-04-27 DIAGNOSIS — H35371 Puckering of macula, right eye: Secondary | ICD-10-CM

## 2021-04-27 NOTE — Progress Notes (Signed)
04/27/2021     CHIEF COMPLAINT Patient presents for Retina Follow Up (1 year fu OU and OCT/Pt states VA OU stable since last visit. Pt denies FOL, floaters, or ocular pain OU. /)   HISTORY OF PRESENT ILLNESS: [name] is a 87 y.o. female who presents to the clinic today for:   HPI     Retina Follow Up           Diagnosis: Dry AMD   Laterality: both eyes   Onset: 1 year ago   Severity: mild   Duration: 1 year   Course: stable   Comments: 1 year fu OU and OCT Pt states VA OU stable since last visit. Pt denies FOL, floaters, or ocular pain OU.         Last edited by Demetrios LollBusick, Erica L, COA on 04/27/2021  1:24 PM.      Referring physician: Elmore GuiseBates, Crystal A, FNP No address on file  HISTORICAL INFORMATION:   Selected notes from the MEDICAL RECORD NUMBER       CURRENT MEDICATIONS: No current outpatient medications on file. (Ophthalmic Drugs)   No current facility-administered medications for this visit. (Ophthalmic Drugs)   Current Outpatient Medications (Other)  Medication Sig   apixaban (ELIQUIS) 5 MG TABS tablet Take 1 tablet (5 mg total) by mouth 2 (two) times daily.   furosemide (LASIX) 40 MG tablet TAKE 1 TABLET BY MOUTH EVERY DAY AS NEEDED   lisinopril (ZESTRIL) 40 MG tablet Take 1 tablet (40 mg total) by mouth daily.   metoprolol tartrate (LOPRESSOR) 25 MG tablet TAKE 1 TABLET BY MOUTH TWICE A DAY   Multiple Vitamin (MULTIVITAMIN) tablet Take 1 tablet by mouth daily.    No current facility-administered medications for this visit. (Other)      REVIEW OF SYSTEMS:    ALLERGIES No Known Allergies  PAST MEDICAL HISTORY Past Medical History:  Diagnosis Date   Arthritis    Atrial fibrillation (HCC)    COPD (chronic obstructive pulmonary disease) (HCC)    Hip pain, left    Hypertension    Sinoatrial node dysfunction (HCC)    Past Surgical History:  Procedure Laterality Date   BACK SURGERY     EYE SURGERY Bilateral    cataract removal   hip  arthoplasty     total   KNEE ARTHROPLASTY     PPM GENERATOR CHANGEOUT N/A 08/14/2018   Procedure: PPM GENERATOR CHANGEOUT;  Surgeon: Marinus Mawaylor, Gregg W, MD;  Location: [facility];  Service: Cardiovascular;  Laterality: N/A;   uterine polyp removal      FAMILY HISTORY Family History  Problem Relation Age of Onset   Prostate cancer Father    Heart failure Father    Heart disease Father    Stroke Sister    Depression Maternal Grandmother     SOCIAL HISTORY Social History   Tobacco Use   Smoking status: Never   Smokeless tobacco: Never  Vaping Use   Vaping Use: Never used  Substance Use Topics   Alcohol use: No    Alcohol/week: 0.0 standard drinks   Drug use: No         OPHTHALMIC EXAM:  Base Eye Exam     Visual Acuity (ETDRS)       Right Left   Dist cc 20/200 20/50 -1   Dist ph cc NI NI    Correction: Glasses         Tonometry (Tonopen, 1:28 PM)  Right Left   Pressure 13 13         Pupils       Pupils Dark Light Shape React APD   Right PERRL 3 2 Round Minimal None   Left PERRL 3 2 Round Minimal None         Visual Fields (Counting fingers)       Left Right    Full Full         Extraocular Movement       Right Left    Full Full         Neuro/Psych     Oriented x3: Yes   Mood/Affect: Normal         Dilation     Both eyes: 1.0% Mydriacyl, 2.5% Phenylephrine @ 1:28 PM           Slit Lamp and Fundus Exam     External Exam       Right Left   External Normal Normal         Slit Lamp Exam       Right Left   Lids/Lashes Normal Normal   Conjunctiva/Sclera White and quiet White and quiet   Cornea Clear Clear   Anterior Chamber Deep and quiet Deep and quiet   Iris Round and reactive Round and reactive   Lens Centered posterior chamber intraocular lens Centered posterior chamber intraocular lens   Anterior Vitreous Normal Normal         Fundus Exam       Right Left   Posterior Vitreous Posterior  vitreous detachment Posterior vitreous detachment   Disc Normal Normal   C/D Ratio 0.6 0.6   Macula Retinal pigment epithelial mottling, Geographic atrophy Retinal pigment epithelial mottling,    Vessels Normal Normal   Periphery Normal Normal            IMAGING AND PROCEDURES  Imaging and Procedures for 04/27/21  OCT, Retina - OU - Both Eyes       Right Eye Quality was good. Scan locations included subfoveal. Central Foveal Thickness: 206. Progression has been stable. Findings include no SRF, no IRF, abnormal foveal contour, inner retinal atrophy, outer retinal atrophy.   Left Eye Quality was good. Scan locations included subfoveal. Central Foveal Thickness: 242. Progression has been stable. Findings include no SRF, no IRF, abnormal foveal contour, inner retinal atrophy, outer retinal atrophy.   Notes No signs of CNVM OU will continue to observe             ASSESSMENT/PLAN:  Posterior vitreous detachment of both eyes  The nature of posterior vitreous detachment was discussed with the patient as well as its physiology, its age prevalence, and its possible implication regarding retinal breaks and detachment.  An informational brochure was offered to the patient.  All the patient's questions were answered.  The patient was asked to return if new or different flashes or floaters develops.   Patient was instructed to contact office immediately if any new changes were noticed. I explained to the patient that vitreous inside the eye is similar to jello inside a bowl. As the jello melts it can start to pull away from the bowl, similarly the vitreous throughout our lives can begin to pull away from the retina. That process is called a posterior vitreous detachment. In some cases, the vitreous can tug hard enough on the retina to form a retinal tear. I discussed with the patient the signs and symptoms of a retinal detachment.  Do not rub the eye.    Intermediate stage nonexudative  age-related macular degeneration of left eye The nature of age--related macular degeneration was discussed with the patient as well as the distinction between dry and wet types. Checking an Amsler Grid daily with advice to return immediately should a distortion develop, was given to the patient. The patient 's smoking status now and in the past was determined and advice based on the AREDS study was provided regarding the consumption of antioxidant supplements. AREDS 2 vitamin formulation was recommended. Consumption of dark leafy vegetables and fresh fruits of various colors was recommended. Treatment modalities for wet macular degeneration particularly the use of intravitreal injections of anti-blood vessel growth factors was discussed with the patient. Avastin, Lucentis, and Eylea are the available options. On occasion, therapy includes the use of photodynamic therapy and thermal laser. Stressed to the patient do not rub eyes.  Patient was advised to check Amsler Grid daily and return immediately if changes are noted. Instructions on using the grid were given to the patient. All patient questions were answered.  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 macular degeneration of right eye with subfoveal involvement  H35.3114 OCT, Retina - OU - Both Eyes    2. Right epiretinal membrane  H35.371     3. Intermediate 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  H35.3122 OCT, Retina - OU - Both Eyes    4. Posterior vitreous detachment of both eyes  H43.813       1.  OU with dry ARMD, no signs of CNVM progression.  We will continue to observe  2.  OD with minor epiretinal membrane seen clinically with no impact acuity  3.  Bilateral PVD stable no holes or tears  Ophthalmic Meds Ordered this visit:  No  orders of the defined types were placed in this encounter.      Return in about 1 year (around 04/27/2022) for DILATE OU, NO DILATE.  There are no Patient Instructions on file for this visit.   Explained the diagnoses, plan, and follow up with the patient and they expressed understanding.  Patient expressed understanding of the importance of proper follow up care.   Alford Highland Emalie Mcwethy M.D. Diseases & Surgery of the Retina and Vitreous [facility] 04/27/21     Abbreviations: M myopia (nearsighted); A astigmatism; H hyperopia (farsighted); P presbyopia; Mrx spectacle prescription;  CTL contact lenses; OD right eye; OS left eye; OU both eyes  XT exotropia; ET esotropia; PEK punctate epithelial keratitis; PEE punctate epithelial erosions; DES dry eye syndrome; MGD meibomian gland dysfunction; ATs artificial tears; PFAT's preservative free artificial tears; NSC nuclear sclerotic cataract; PSC posterior subcapsular cataract; ERM epi-retinal membrane; PVD posterior vitreous detachment; RD retinal detachment; DM diabetes mellitus; DR diabetic retinopathy; NPDR non-proliferative diabetic retinopathy; PDR proliferative diabetic retinopathy; CSME clinically significant macular edema; DME diabetic macular edema; dbh dot blot hemorrhages; CWS cotton wool spot; POAG primary open angle glaucoma; C/D cup-to-disc ratio; HVF humphrey visual field; GVF goldmann visual field; OCT optical coherence tomography; IOP intraocular pressure; BRVO Branch retinal vein occlusion; CRVO central retinal vein occlusion; CRAO central retinal artery occlusion; BRAO branch retinal artery occlusion; RT retinal tear; SB scleral buckle; PPV pars plana vitrectomy; VH Vitreous hemorrhage; PRP panretinal laser photocoagulation; IVK intravitreal kenalog; VMT vitreomacular traction; MH  Macular hole;  NVD neovascularization of the disc; NVE neovascularization elsewhere; AREDS age related eye disease study; ARMD age related  macular degeneration; POAG primary open angle glaucoma; EBMD epithelial/anterior basement membrane dystrophy; ACIOL anterior chamber intraocular lens; IOL intraocular lens; PCIOL posterior chamber intraocular lens; Phaco/IOL phacoemulsification with intraocular lens placement; PRK photorefractive keratectomy; LASIK laser assisted in situ keratomileusis; HTN hypertension; DM diabetes mellitus; COPD chronic obstructive pulmonary disease

## 2021-04-27 NOTE — Assessment & Plan Note (Signed)
Dry atrophic AMD accounts for acuity OD 

## 2021-04-27 NOTE — Assessment & Plan Note (Signed)

## 2021-04-27 NOTE — Assessment & Plan Note (Signed)
The nature of age--related macular degeneration was discussed with the patient as well as the distinction between dry and wet types. Checking an Amsler Grid daily with advice to return immediately should a distortion develop, was given to the patient. The patient 's smoking status now and in the past was determined and advice based on the AREDS study was provided regarding the consumption of antioxidant supplements. AREDS 2 vitamin formulation was recommended. Consumption of dark leafy vegetables and fresh fruits of various colors was recommended. Treatment modalities for wet macular degeneration particularly the use of intravitreal injections of anti-blood vessel growth factors was discussed with the patient. Avastin, Lucentis, and Eylea are the available options. On occasion, therapy includes the use of photodynamic therapy and thermal laser. Stressed to the patient do not rub eyes.  Patient was advised to check Amsler Grid daily and return immediately if changes are noted. Instructions on using the grid were given to the patient. All patient questions were answered.  OS stable with no progression

## 2021-05-03 ENCOUNTER — Encounter: Payer: Self-pay | Admitting: Nurse Practitioner

## 2021-05-03 ENCOUNTER — Other Ambulatory Visit: Payer: Self-pay

## 2021-05-03 ENCOUNTER — Ambulatory Visit (INDEPENDENT_AMBULATORY_CARE_PROVIDER_SITE_OTHER): Payer: Medicare Other | Admitting: Nurse Practitioner

## 2021-05-03 VITALS — BP 112/66 | HR 61 | Temp 98.2°F | Ht 65.0 in | Wt 139.0 lb

## 2021-05-03 DIAGNOSIS — I10 Essential (primary) hypertension: Secondary | ICD-10-CM

## 2021-05-03 DIAGNOSIS — Z950 Presence of cardiac pacemaker: Secondary | ICD-10-CM | POA: Diagnosis not present

## 2021-05-03 DIAGNOSIS — Z0001 Encounter for general adult medical examination with abnormal findings: Secondary | ICD-10-CM | POA: Diagnosis not present

## 2021-05-03 DIAGNOSIS — Z0000 Encounter for general adult medical examination without abnormal findings: Secondary | ICD-10-CM

## 2021-05-03 MED ORDER — LISINOPRIL 40 MG PO TABS: 40.0000 mg | ORAL_TABLET | Freq: Every day | ORAL | 1 refills | Status: DC

## 2021-05-03 MED ORDER — METOPROLOL TARTRATE 25 MG PO TABS: 25.0000 mg | ORAL_TABLET | Freq: Two times a day (BID) | ORAL | 3 refills | Status: DC

## 2021-05-03 MED ORDER — FUROSEMIDE 40 MG PO TABS: 40.0000 mg | ORAL_TABLET | Freq: Every day | ORAL | 3 refills | Status: DC | PRN

## 2021-05-03 MED ORDER — ELIQUIS 5 MG PO TABS: 5.0000 mg | ORAL_TABLET | Freq: Two times a day (BID) | ORAL | 6 refills | Status: DC

## 2021-05-03 NOTE — Progress Notes (Signed)
Patient: [name], Female    DOB: [birth year], 87 y.o.   MRN: [id number]  Visit Date: 05/04/2021  Today's Provider: Valentino Nose, NP   Chief Complaint  Patient presents with   Medicare Wellness    Pt is wanting to talk about a supplement that will help with her memory, screening done, does not want to give copy of living will for chart    Subjective:   [name] is a 87 y.o. female who presents today with step-daughter for her Subsequent Annual Wellness Visit.  Care team:   Primary care - Lynnea Ferrier, MD Cardiology - Lewayne Bunting, MD Retina Specialist - Fawn Kirk, MD  HPI  Patient reports things are overall going well.    Atrial fibrillation and history of sinoatrial node dysfunction with pacemaker implant - maintains on metoprolol 25 mg twice daily and eliquis 5 mg twice daily.  Takes lasix every other day.  Lisinopril 40 mg daily for blood pressure.  Tolerating all of these medications well.  Follows with Cardiology regularly and was recently transitioned from Coumadin to Eliquis.  She reports Eliquis is a bit expensive for her - $47 monthly.  No other concerns or complaints today.  Review of Systems  Constitutional: Negative.   HENT: Negative.    Eyes: Negative.   Respiratory: Negative.    Cardiovascular: Negative.   Gastrointestinal: Negative.   Musculoskeletal: Negative.   Neurological: Negative.   Hematological: Negative.   Psychiatric/Behavioral: Negative.     Past Medical History:  Diagnosis Date   Arthritis    Atrial fibrillation (HCC)    COPD (chronic obstructive pulmonary disease) (HCC)    Hip pain, left    Hypertension    Sinoatrial node dysfunction (HCC)     Past Surgical History:  Procedure Laterality Date   BACK SURGERY     EYE SURGERY Bilateral    cataract removal   hip arthoplasty     total   KNEE ARTHROPLASTY     PPM GENERATOR CHANGEOUT N/A 08/14/2018   Procedure: PPM GENERATOR CHANGEOUT;  Surgeon: Marinus Maw, MD;   Location: [facility];  Service: Cardiovascular;  Laterality: N/A;   uterine polyp removal      Family History  Problem Relation Age of Onset   Prostate cancer Father    Heart failure Father    Heart disease Father    Stroke Sister    Depression Maternal Grandmother     Social History   Socioeconomic History   Marital status: Widowed    Spouse name: Not on file   Number of children: Not on file   Years of education: Not on file   Highest education level: Not on file  Occupational History   Occupation: Retired    Associate Professor: RETIRED  Tobacco Use   Smoking status: Never   Smokeless tobacco: Never  Vaping Use   Vaping Use: Never used  Substance and Sexual Activity   Alcohol use: No    Alcohol/week: 0.0 standard drinks   Drug use: No   Sexual activity: Not Currently    Birth control/protection: Post-menopausal, None  Other Topics Concern   Not on file  Social History Narrative   Not on file   Social Determinants of Health   Financial Resource Strain: Not on file  Food Insecurity: Not on file  Transportation Needs: Not on file  Physical Activity: Not on file  Stress: Not on file  Social Connections: Not on file  Intimate Partner Violence: Not  on file    Outpatient Encounter Medications as of 05/03/2021  Medication Sig   Multiple Vitamin (MULTIVITAMIN) tablet Take 1 tablet by mouth daily.    [DISCONTINUED] apixaban (ELIQUIS) 5 MG TABS tablet Take 1 tablet (5 mg total) by mouth 2 (two) times daily.   [DISCONTINUED] furosemide (LASIX) 40 MG tablet TAKE 1 TABLET BY MOUTH EVERY DAY AS NEEDED   [DISCONTINUED] lisinopril (ZESTRIL) 40 MG tablet Take 1 tablet (40 mg total) by mouth daily.   [DISCONTINUED] metoprolol tartrate (LOPRESSOR) 25 MG tablet TAKE 1 TABLET BY MOUTH TWICE A DAY   apixaban (ELIQUIS) 5 MG TABS tablet Take 1 tablet (5 mg total) by mouth 2 (two) times daily.   furosemide (LASIX) 40 MG tablet Take 1 tablet (40 mg total) by mouth daily as needed.    lisinopril (ZESTRIL) 40 MG tablet Take 1 tablet (40 mg total) by mouth daily.   metoprolol tartrate (LOPRESSOR) 25 MG tablet Take 1 tablet (25 mg total) by mouth 2 (two) times daily.   No facility-administered encounter medications on file as of 05/03/2021.    Functional Status Survey: Is the patient deaf or have difficulty hearing?: Yes Does the patient have difficulty seeing, even when wearing glasses/contacts?: Yes Does the patient have difficulty concentrating, remembering, or making decisions?: Yes Does the patient have difficulty walking or climbing stairs?: Yes Does the patient have difficulty dressing or bathing?: No Does the patient have difficulty doing errands alone such as visiting a doctor's office or shopping?: Yes   Fall Risk Assessment Fall Risk  05/04/2021 05/03/2021 03/03/2020  Falls in the past year? 1 1 0  Number falls in past yr: 1 1 0  Injury with Fall? 1 1 -  Risk for fall due to : History of fall(s);Impaired balance/gait;Impaired mobility Impaired balance/gait;History of fall(s);Impaired mobility -  Follow up Falls evaluation completed;Education provided;Falls prevention discussed - Falls evaluation completed    Depression Screen [facility] 05/04/2021 03/03/2020  Decreased Interest 0 0  Down, Depressed, Hopeless 0 0  PHQ - 2 Score 0 0  Altered sleeping - 0  Tired, decreased energy - 0  Change in appetite - 0  Feeling bad or failure about yourself  - 0  Trouble concentrating - 0  Moving slowly or fidgety/restless - 0  Suicidal thoughts - 0  PHQ-9 Score - 0  Difficult doing work/chores - Not difficult at all  Some recent data might be hidden    6CIT Screen 05/04/2021 05/03/2021  What Year? 0 points 0 points  What month? 0 points 0 points  What time? 0 points 0 points  Count back from 20 0 points 0 points  Months in reverse 2 points 0 points  Repeat phrase 4 points 2 points  Total Score 6 2   Advanced Directives Does patient have a HCPOA?     yes If yes, name and contact information: son  Does patient have a living will or MOST form?  Yes; does not wish to provide us with a copy  Objective:   Vitals: BP 112/66   Pulse 61   Temp 98.2 F (36.8 C)   Ht 5\' 5"  (1.651 m)   Wt 139 lb (63 kg)   SpO2 100%   BMI 23.13 kg/m  Body mass index is 23.13 kg/m. No results found.  Physical Exam Vitals and nursing note reviewed.  Constitutional:      General: She is not in acute distress.    Appearance: Normal appearance. She is not  toxic-appearing.  HENT:     Head: Normocephalic and atraumatic.     Right Ear: External ear normal.     Left Ear: External ear normal.  Eyes:     General: No scleral icterus.    Extraocular Movements: Extraocular movements intact.  Cardiovascular:     Rate and Rhythm: Normal rate. Rhythm irregular.  Pulmonary:     Effort: Pulmonary effort is normal. No respiratory distress.     Breath sounds: Normal breath sounds. No wheezing, rhonchi or rales.  Abdominal:     General: Abdomen is flat. Bowel sounds are normal.     Palpations: Abdomen is soft.  Musculoskeletal:     Cervical back: Normal range of motion.     Right lower leg: No edema.     Left lower leg: No edema.  Lymphadenopathy:     Cervical: No cervical adenopathy.  Skin:    General: Skin is warm and dry.     Coloration: Skin is not jaundiced or pale.     Findings: No erythema.  Neurological:     Mental Status: She is alert. Mental status is at baseline.     Gait: Gait abnormal (sitting in wheelchair, walks with walker at home).  Psychiatric:        Mood and Affect: Mood normal.        Behavior: Behavior normal.        Judgment: Judgment normal.    Assessment & Plan:  1. Encounter for annual wellness visit (AWV) in Medicare patient  2. Essential hypertension Chronic.  Blood pressures well controlled today in clinic.  Plan to continue current medications as patient tolerating them well.  She has had 1 fall, however do not think it  was related to low blood pressure.  Follow-up in 6 months with PCP.  - CBC with Differential - COMPLETE METABOLIC PANEL WITH GFR - furosemide (LASIX) 40 MG tablet; Take 1 tablet (40 mg total) by mouth daily as needed.  Dispense: 90 tablet; Refill: 3 - lisinopril (ZESTRIL) 40 MG tablet; Take 1 tablet (40 mg total) by mouth daily.  Dispense: 90 tablet; Refill: 1  3. Cardiac pacemaker Patient is ventricular paced per last EKG.  Rhythm sounds slightly irregular today, however this may be related to pacemaker.  Continue Eliquis and Lopressor both twice daily for now.  Will place referral to chronic care management -pharmacy to help with affordability of Eliquis.  Follow-up 6 months or sooner if needed.  Continue collaboration with cardiology.  - apixaban (ELIQUIS) 5 MG TABS tablet; Take 1 tablet (5 mg total) by mouth 2 (two) times daily.  Dispense: 60 tablet; Refill: 6 - metoprolol tartrate (LOPRESSOR) 25 MG tablet; Take 1 tablet (25 mg total) by mouth 2 (two) times daily.  Dispense: 180 tablet; Refill: 3 - AMB Referral to Community Care Coordinaton     Reviewed patient's Family Medical History Reviewed and updated list of patient's medical providers Assessment of cognitive impairment was done Assessed patient's functional ability Established a written schedule for health screening services Health Risk Assessent Completed and Reviewed  Health Maintenance reviewed - She is up to date on COVID vaccines, however cannot provide exact dates.  I have encouraged her to get shingles vaccine.  Immunization History  Administered Date(s) Administered   Fluad Quad(high Dose 65+) 09/08/2020   Influenza-Unspecified 09/12/2014   PFIZER(Purple Top)SARS-COV-2 Vaccination 09/19/2020   Pneumococcal Conjugate-13 03/03/2020    Health Maintenance  Topic Date Due   COVID-19 Vaccine (2 - Pfizer risk series)  05/19/2021 (Originally 10/10/2020)   Zoster Vaccines- Shingrix (1 of 2) 08/03/2021 (Originally  05/02/1942)   TETANUS/TDAP  05/03/2022 (Originally 05/02/1942)   PNA vac Low Risk Adult (2 of 2 - PPSV23) 05/03/2022 (Originally 03/03/2021)   INFLUENZA VACCINE  06/12/2021   DEXA SCAN  Completed   HPV VACCINES  Aged Out    Discussed health benefits of physical activity, and encouraged her to engage in regular exercise appropriate for her age and condition.   Meds ordered this encounter  Medications   apixaban (ELIQUIS) 5 MG TABS tablet    Sig: Take 1 tablet (5 mg total) by mouth 2 (two) times daily.    Dispense:  60 tablet    Refill:  6    Order Specific Question:   Supervising Provider    Answer:   Lynnea Ferrier T [[id number]]   furosemide (LASIX) 40 MG tablet    Sig: Take 1 tablet (40 mg total) by mouth daily as needed.    Dispense:  90 tablet    Refill:  3    Order Specific Question:   Supervising Provider    Answer:   Lynnea Ferrier T [[id number]]   lisinopril (ZESTRIL) 40 MG tablet    Sig: Take 1 tablet (40 mg total) by mouth daily.    Dispense:  90 tablet    Refill:  1    Order Specific Question:   Supervising Provider    Answer:   Lynnea Ferrier T [[id number]]   metoprolol tartrate (LOPRESSOR) 25 MG tablet    Sig: Take 1 tablet (25 mg total) by mouth 2 (two) times daily.    Dispense:  180 tablet    Refill:  3    Order Specific Question:   Supervising Provider    Answer:   Lynnea Ferrier T [[id number]]    Current Outpatient Medications:    Multiple Vitamin (MULTIVITAMIN) tablet, Take 1 tablet by mouth daily. , Disp: , Rfl:    apixaban (ELIQUIS) 5 MG TABS tablet, Take 1 tablet (5 mg total) by mouth 2 (two) times daily., Disp: 60 tablet, Rfl: 6   furosemide (LASIX) 40 MG tablet, Take 1 tablet (40 mg total) by mouth daily as needed., Disp: 90 tablet, Rfl: 3   lisinopril (ZESTRIL) 40 MG tablet, Take 1 tablet (40 mg total) by mouth daily., Disp: 90 tablet, Rfl: 1   metoprolol tartrate (LOPRESSOR) 25 MG tablet, Take 1 tablet (25 mg total) by mouth 2 (two) times daily., Disp: 180 tablet, Rfl:  3 Medications Discontinued During This Encounter  Medication Reason   furosemide (LASIX) 40 MG tablet Reorder   metoprolol tartrate (LOPRESSOR) 25 MG tablet Reorder   apixaban (ELIQUIS) 5 MG TABS tablet Reorder   lisinopril (ZESTRIL) 40 MG tablet Reorder    Next Medicare Wellness Visit in 12+ months

## 2021-05-04 LAB — CBC WITH DIFFERENTIAL/PLATELET
Absolute Monocytes: 592 cells/uL (ref 200–950)
Basophils Absolute: 41 cells/uL (ref 0–200)
Basophils Relative: 0.6 %
Eosinophils Absolute: 218 cells/uL (ref 15–500)
Eosinophils Relative: 3.2 %
HCT: 37.2 % (ref 35.0–45.0)
Hemoglobin: 12.2 g/dL (ref 11.7–15.5)
Lymphs Abs: 1265 cells/uL (ref 850–3900)
MCH: 33.5 pg — ABNORMAL HIGH (ref 27.0–33.0)
MCHC: 32.8 g/dL (ref 32.0–36.0)
MCV: 102.2 fL — ABNORMAL HIGH (ref 80.0–100.0)
MPV: 12.4 fL (ref 7.5–12.5)
Monocytes Relative: 8.7 %
Neutro Abs: 4685 cells/uL (ref 1500–7800)
Neutrophils Relative %: 68.9 %
Platelets: 234 10*3/uL (ref 140–400)
RBC: 3.64 10*6/uL — ABNORMAL LOW (ref 3.80–5.10)
RDW: 11.7 % (ref 11.0–15.0)
Total Lymphocyte: 18.6 %
WBC: 6.8 10*3/uL (ref 3.8–10.8)

## 2021-05-04 LAB — COMPLETE METABOLIC PANEL WITH GFR
AG Ratio: 1.3 (calc) (ref 1.0–2.5)
ALT: 12 U/L (ref 6–29)
AST: 21 U/L (ref 10–35)
Albumin: 3.5 g/dL — ABNORMAL LOW (ref 3.6–5.1)
Alkaline phosphatase (APISO): 71 U/L (ref 37–153)
BUN: 16 mg/dL (ref 7–25)
CO2: 28 mmol/L (ref 20–32)
Calcium: 8.8 mg/dL (ref 8.6–10.4)
Chloride: 102 mmol/L (ref 98–110)
Creat: 0.66 mg/dL (ref 0.60–0.88)
GFR, Est African American: 86 mL/min/{1.73_m2} (ref >=60)
GFR, Est Non African American: 75 mL/min/{1.73_m2} (ref >=60)
Globulin: 2.8 g/dL (calc) (ref 1.9–3.7)
Glucose, Bld: 99 mg/dL (ref 65–99)
Potassium: 4.8 mmol/L (ref 3.5–5.3)
Sodium: 138 mmol/L (ref 135–146)
Total Bilirubin: 0.4 mg/dL (ref 0.2–1.2)
Total Protein: 6.3 g/dL (ref 6.1–8.1)

## 2021-05-05 NOTE — Progress Notes (Signed)
Left message for pt to call back for lab results.

## 2021-05-08 ENCOUNTER — Telehealth: Payer: Self-pay | Admitting: Family Medicine

## 2021-05-08 NOTE — Chronic Care Management (AMB) (Signed)
  Chronic Care Management   Note  05/08/2021 Name: [name] MRN: [id number] DOB: Jun 28, 1923  Fenton Foy Coach is a 87 y.o. year old female who is a primary care patient of Pickard, Priscille Heidelberg, MD. I reached out to [facility] by phone today in response to a referral sent by Ms. Osie H Matuszak's PCP, Donita Brooks, MD.   Ms. Cervenka was given information about Chronic Care Management services today including:  CCM service includes personalized support from designated clinical staff supervised by her physician, including individualized plan of care and coordination with other care providers 24/7 contact phone numbers for assistance for urgent and routine care needs. Service will only be billed when office clinical staff spend 20 minutes or more in a month to coordinate care. Only one practitioner may furnish and bill the service in a calendar month. The patient may stop CCM services at any time (effective at the end of the month) by phone call to the office staff.   Graciella Belton verbally agreed to assistance and services provided by embedded care coordination/care management team today.  Follow up plan:   [employer] manager, [employer] food

## 2021-06-26 ENCOUNTER — Telehealth: Payer: Self-pay | Admitting: Pharmacist

## 2021-06-26 NOTE — Progress Notes (Addendum)
    Chronic Care Management Pharmacy Assistant   Name: [name]  MRN: [id number] DOB: April 24, 1923  [name] is an 87 y.o. year old female who presents for his initial CCM visit with the clinical pharmacist.  Reason for Encounter: Chart Prep    Conditions to be addressed/monitored: HTN, COPD  Primary concerns for visit include: HTN, COPD  Recent office visits:  05/03/21 Valentino Nose, NP. For medicare wellness. No medication changes.   Recent consult visits:  04/27/21 Ophthalmology Luciana Axe, Alford Highland, MD. For follow-up. No medication changes.   Hospital visits:  None in previous 6 months  Medication History: Lisinopril 40 mg 90 DS 04/04/21  Medications: Outpatient Encounter Medications as of 06/26/2021  Medication Sig   apixaban (ELIQUIS) 5 MG TABS tablet Take 1 tablet (5 mg total) by mouth 2 (two) times daily.   furosemide (LASIX) 40 MG tablet Take 1 tablet (40 mg total) by mouth daily as needed.   lisinopril (ZESTRIL) 40 MG tablet Take 1 tablet (40 mg total) by mouth daily.   metoprolol tartrate (LOPRESSOR) 25 MG tablet Take 1 tablet (25 mg total) by mouth 2 (two) times daily.   Multiple Vitamin (MULTIVITAMIN) tablet Take 1 tablet by mouth daily.    No facility-administered encounter medications on file as of 06/26/2021.    Have you seen any other providers since your last visit? Patient stated no.   Any changes in your medications or health? Patient stated no.  Any side effects from any medications? Patient stated no.   Do you have an symptoms or problems not managed by your medications? Patient stated no.  Any concerns about your health right now? Patient stated no.  Has your provider asked that you check blood pressure, blood sugar, or follow special diet at home? Patient stated no.  Do you get any type of exercise on a regular basis? Patient stated she does some.   Can you think of a goal you would like to reach for your health? Patient stated  no.  Do you have any problems getting your medications? Patient stated no.  Is there anything that you would like to discuss during the appointment? Patient stated no.   Please bring medications and supplements to appointment, patient reminded of her face to face appointment on 06/29/21 at 9 am.  Follow-Up:Pharmacist Review  Hulen Luster, RMA Clinical Pharmacist Assistant [phone]

## 2021-06-29 ENCOUNTER — Ambulatory Visit: Payer: Commercial Managed Care - HMO

## 2021-06-29 NOTE — Progress Notes (Deleted)
Chronic Care Management Pharmacy Note  06/29/2021 Name:  [name] MRN:  [id number] DOB:  [birth year]  Summary: ***  Recommendations/Changes made from today's visit: ***  Plan: ***   Subjective: [name] is an 87 y.o. year old female who is a primary patient of Pickard, Cammie Mcgee, MD.  The CCM team was consulted for assistance with disease management and care coordination needs.    Engaged with patient face to face for initial visit in response to provider referral for pharmacy case management and/or care coordination services.   Consent to Services:  The patient was given the following information about Chronic Care Management services today, agreed to services, and gave verbal consent: 1. CCM service includes personalized support from designated clinical staff supervised by the primary care provider, including individualized plan of care and coordination with other care providers 2. 24/7 contact phone numbers for assistance for urgent and routine care needs. 3. Service will only be billed when office clinical staff spend 20 minutes or more in a month to coordinate care. 4. Only one practitioner may furnish and bill the service in a calendar month. 5.The patient may stop CCM services at any time (effective at the end of the month) by phone call to the office staff. 6. The patient will be responsible for cost sharing (co-pay) of up to 20% of the service fee (after annual deductible is met). Patient agreed to services and consent obtained.  Patient Care Team: Susy Frizzle, MD as PCP - General (Family Medicine) Harl Bowie Alphonse Guild, MD as PCP - Cardiology (Cardiology) Miguel Dibble, MD (Urology) Jonnie Kind, MD (Inactive) (Obstetrics and Gynecology) Evans Lance, MD (Cardiology) Edythe Clarity, [facility] as Pharmacist (Pharmacist)  Recent office visits:  05/03/21 Eulogio Bear, NP. For medicare wellness. No medication changes.    Recent consult visits:   04/27/21 Ophthalmology Zadie Rhine, Clent Demark, MD. For follow-up. No medication changes.    Hospital visits:  None in previous 6 months   Medication History: Lisinopril 40 mg 90 DS 04/04/21     Objective:  Lab Results  Component Value Date   CREATININE 0.66 05/03/2021   BUN 16 05/03/2021   GFRNONAA 75 05/03/2021   GFRAA 86 05/03/2021   NA 138 05/03/2021   K 4.8 05/03/2021   CALCIUM 8.8 05/03/2021   CO2 28 05/03/2021   GLUCOSE 99 05/03/2021    No results found for: HGBA1C, FRUCTOSAMINE, GFR, MICROALBUR  Last diabetic Eye exam: No results found for: HMDIABEYEEXA  Last diabetic Foot exam: No results found for: HMDIABFOOTEX   Lab Results  Component Value Date   CHOL 155 03/03/2020   HDL 54 03/03/2020   LDLCALC 82 03/03/2020   TRIG 96 03/03/2020   CHOLHDL 2.9 03/03/2020    Hepatic Function Latest Ref Rng & Units 05/03/2021 03/03/2020 05/20/2019  Total Protein 6.1 - 8.1 g/dL 6.3 6.8 5.8(L)  Albumin 3.5 - 5.0 g/dL - - 2.8(L)  AST 10 - 35 U/L 21 20 30   ALT 6 - 29 U/L 12 14 20   Alk Phosphatase 38 - 126 U/L - - 49  Total Bilirubin 0.2 - 1.2 mg/dL 0.4 0.7 1.0  Bilirubin, Direct mg/dL - - -    Lab Results  Component Value Date/Time   TSH 0.617 05/22/2019 04:34 AM   TSH 0.976 03/05/2007 12:00 AM    CBC Latest Ref Rng & Units 05/03/2021 09/05/2020 05/25/2020  WBC 3.8 - 10.8 Thousand/uL 6.8 7.3 6.1  Hemoglobin 11.7 - 15.5 g/dL  12.2 13.3 12.1  Hematocrit 35.0 - 45.0 % 37.2 41.3 39.4  Platelets 140 - 400 Thousand/uL 234 214 215    No results found for: VD25OH  Clinical ASCVD: {YES/NO:21197} The ASCVD Risk score Mikey Bussing DC Jr., et al., 2013) failed to calculate for the following reasons:   The 2013 ASCVD risk score is only valid for ages 42 to 70    Depression screen PHQ 2/9 05/04/2021 03/03/2020  Decreased Interest 0 0  Down, Depressed, Hopeless 0 0  PHQ - 2 Score 0 0  Altered sleeping - 0  Tired, decreased energy - 0  Change in appetite - 0  Feeling bad or failure about  yourself  - 0  Trouble concentrating - 0  Moving slowly or fidgety/restless - 0  Suicidal thoughts - 0  PHQ-9 Score - 0  Difficult doing work/chores - Not difficult at all  Some recent data might be hidden     ***Other: (CHADS2VASc if Afib, MMRC or CAT for COPD, ACT, DEXA)  Social History   Tobacco Use  Smoking Status Never  Smokeless Tobacco Never   BP Readings from Last 3 Encounters:  05/03/21 112/66  12/27/20 130/62  12/22/20 130/80   Pulse Readings from Last 3 Encounters:  05/03/21 61  12/27/20 72  12/22/20 69   Wt Readings from Last 3 Encounters:  05/03/21 139 lb (63 kg)  12/27/20 146 lb (66.2 kg)  12/22/20 142 lb 6.4 oz (64.6 kg)   BMI Readings from Last 3 Encounters:  05/03/21 23.13 kg/m  12/27/20 24.30 kg/m  12/22/20 23.70 kg/m    Assessment/Interventions: Review of patient past medical history, allergies, medications, health status, including review of consultants reports, laboratory and other test data, was performed as part of comprehensive evaluation and provision of chronic care management services.   SDOH:  (Social Determinants of Health) assessments and interventions performed: {yes/no:20286}  SDOH Screenings   Alcohol Screen: Not on file  Depression (PHQ2-9): Low Risk    PHQ-2 Score: 0  Financial Resource Strain: Not on file  Food Insecurity: Not on file  Housing: Not on file  Physical Activity: Not on file  Social Connections: Not on file  Stress: Not on file  Tobacco Use: Low Risk    Smoking Tobacco Use: Never   Smokeless Tobacco Use: Never  Transportation Needs: Not on file    [place]  No Known Allergies  Medications Reviewed Today     Reviewed by Amalia Hailey, CMA (Certified Medical Assistant) on 05/03/21 at 1243  Med List Status: <None>   Medication Order Taking? Sig Documenting Provider Last Dose Status Informant  apixaban (ELIQUIS) 5 MG TABS tablet [id number] Yes Take 1 tablet (5 mg total) by mouth 2 (two) times  daily. Susy Frizzle, MD Taking Active   furosemide (LASIX) 40 MG tablet [id number] Yes TAKE 1 TABLET BY MOUTH EVERY DAY AS NEEDED Annie Main, FNP Taking Active   lisinopril (ZESTRIL) 40 MG tablet [id number] Yes Take 1 tablet (40 mg total) by mouth daily. Susy Frizzle, MD Taking Active   metoprolol tartrate (LOPRESSOR) 25 MG tablet [id number] Yes TAKE 1 TABLET BY MOUTH TWICE A DAY Bates, Crystal A, FNP Taking Active   Multiple Vitamin (MULTIVITAMIN) tablet [id number] Yes Take 1 tablet by mouth daily.  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 04/27/2020   FTT (failure to thrive) in adult    Hematoma of scalp    Wheezing    Acute blood loss anemia 11/13/2016   Syncope and collapse 11/13/2016   Fall 11/12/2016   Annual physical exam 12/20/2014   Hip fracture ([place]) 05/25/2014   H/O bilateral salpingo-oophorectomy 12/11/2013   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   Sinoatrial node dysfunction ([place])    Encounter for long-term (current) use of anticoagulants 04/30/2011   Cardiac pacemaker 04/13/2009   Essential hypertension 04/12/2009   COPD (chronic obstructive pulmonary disease) ([place]) 04/12/2009   Arthropathy 04/12/2009    Immunization History  Administered Date(s) Administered   Fluad Quad(high Dose 65+) 09/08/2020   Influenza-Unspecified 09/12/2014   PFIZER(Purple Top)SARS-COV-2 Vaccination 09/19/2020   Pneumococcal Conjugate-13 03/03/2020    Conditions to be addressed/monitored:  HTN, COPD, Afib  There are no care plans that you recently modified to display for this patient.    Medication  Assistance: {MEDASSISTANCEINFO:25044}  Compliance/Adherence/Medication fill history: Care Gaps: ***  Star-Rating Drugs: ***  Patient's preferred pharmacy is:  [id number] [place] [address] [place]Phone: [phone]: [phone] [facility]  [address]Phone: [phone]: [phone] Uses pill box? {Yes or If no, why not?:20788} Pt endorses ***% compliance  We discussed: {Pharmacy options:24294} Patient decided to: {US Pharmacy Plan:23885}  Care Plan and Follow Up Patient Decision:  {FOLLOWUP:24991}  Plan: {CM FOLLOW UP PWTUU:82800} ***  Current Barriers:  {pharmacybarriers:24917}  Pharmacist Clinical Goal(s):  Patient will {PHARMACYGOALCHOICES:24921} through collaboration with PharmD and provider.   Interventions: 1:1 collaboration with PSusy Frizzle MD regarding development and update of comprehensive plan of care as evidenced by provider attestation and co-signature Inter-disciplinary care team collaboration (see longitudinal plan of care) Comprehensive medication review performed; medication list updated in electronic medical record  Hypertension (BP goal {CHL HP UPSTREAM Pharmacist BP ranges:[phone]}) -{US controlled/uncontrolled:25276} -Current treatment: Lisinopril 421mdaily -Medications previously tried: ***  -Current home readings: *** -Current dietary habits: *** -Current exercise habits: *** -{ACTIONS;DENIES/REPORTS:21021675::"Denies"} hypotensive/hypertensive symptoms -Educated on {CCM BP Counseling:25124} -Counseled to monitor BP at home ***, document, and provide log at future appointments -{CCMPHARMDINTERVENTION:25122}  Atrial Fibrillation (Goal: prevent stroke and major bleeding) -{US controlled/uncontrolled:25276} -CHADSVASC: *** -Current  treatment: Rate control: Metoprolol tartrate 2536mID Anticoagulation: Eliquis 5mg62mD -Medications previously tried: *** -Home BP and HR readings: ***  -Counseled on {CCMAFIBCOUNSELING:25120} -{CCMPHARMDINTERVENTION:25122}  COPD (Goal: control symptoms and prevent exacerbations) -{US controlled/uncontrolled:25276} -Current treatment  None -Medications previously tried: ***  -Gold Grade: {CHL HP Upstream Pharm COPD Gold GradLKJZP:[id number]}rrent COPD Classification:  {CHL HP Upstream Pharm COPD [phone]} -MMRC/CAT score: *** -Pulmonary function testing: *** -Exacerbations requiring treatment in last 6 months: *** -Patient {Actions; denies-reports:120008} consistent use of maintenance inhaler -Frequency of rescue inhaler use: *** -Counseled on {CCMINHALERCOUNSELING:25121} -{CCMPHARMDINTERVENTION:25122}  Patient Goals/Self-Care Activities Patient will:  - {pharmacypatientgoals:24919}  Follow Up Plan: {CM FOLLOW UP PLANIAXK:55374}

## 2021-07-05 ENCOUNTER — Ambulatory Visit (INDEPENDENT_AMBULATORY_CARE_PROVIDER_SITE_OTHER): Payer: Medicare Other

## 2021-07-05 ENCOUNTER — Ambulatory Visit: Payer: Medicare HMO | Admitting: Cardiology

## 2021-07-05 DIAGNOSIS — I495 Sick sinus syndrome: Secondary | ICD-10-CM

## 2021-07-05 LAB — CUP PACEART REMOTE DEVICE CHECK
Battery Remaining Longevity: 103 mo
Battery Remaining Percentage: 82 %
Battery Voltage: 3.01 V
Brady Statistic RV Percent Paced: 96 %
Date Time Interrogation Session: 20220824020018
Implantable Lead Implant Date: 20070518
Implantable Lead Implant Date: 20070518
Implantable Lead Location: 753859
Implantable Lead Location: 753860
Implantable Pulse Generator Implant Date: 20191003
Lead Channel Impedance Value: 640 Ohm
Lead Channel Pacing Threshold Amplitude: 0.5 V
Lead Channel Pacing Threshold Pulse Width: 0.5 ms
Lead Channel Sensing Intrinsic Amplitude: 11.8 mV
Lead Channel Setting Pacing Amplitude: 2.5 V
Lead Channel Setting Pacing Pulse Width: 0.5 ms
Lead Channel Setting Sensing Sensitivity: 2 mV
Pulse Gen Model: 1272
Pulse Gen Serial Number: 9067608

## 2021-07-06 ENCOUNTER — Ambulatory Visit: Payer: Medicare Other | Admitting: Cardiology

## 2021-07-06 ENCOUNTER — Other Ambulatory Visit: Payer: Self-pay

## 2021-07-06 ENCOUNTER — Encounter: Payer: Self-pay | Admitting: Cardiology

## 2021-07-06 VITALS — BP 134/74 | HR 63 | Ht 65.0 in | Wt 139.8 lb

## 2021-07-06 DIAGNOSIS — I495 Sick sinus syndrome: Secondary | ICD-10-CM

## 2021-07-06 DIAGNOSIS — I10 Essential (primary) hypertension: Secondary | ICD-10-CM

## 2021-07-06 DIAGNOSIS — Z950 Presence of cardiac pacemaker: Secondary | ICD-10-CM | POA: Diagnosis not present

## 2021-07-06 DIAGNOSIS — I4891 Unspecified atrial fibrillation: Secondary | ICD-10-CM | POA: Diagnosis not present

## 2021-07-06 MED ORDER — METOPROLOL TARTRATE 25 MG PO TABS: 25.0000 mg | ORAL_TABLET | Freq: Two times a day (BID) | ORAL | 3 refills | Status: DC

## 2021-07-06 MED ORDER — FUROSEMIDE 40 MG PO TABS: 40.0000 mg | ORAL_TABLET | Freq: Every day | ORAL | 3 refills | Status: AC | PRN

## 2021-07-06 MED ORDER — LISINOPRIL 40 MG PO TABS: 40.0000 mg | ORAL_TABLET | Freq: Every day | ORAL | 1 refills | Status: DC

## 2021-07-06 MED ORDER — APIXABAN 5 MG PO TABS: 5.0000 mg | ORAL_TABLET | Freq: Two times a day (BID) | ORAL | 6 refills | Status: DC

## 2021-07-06 NOTE — Patient Instructions (Signed)

## 2021-07-06 NOTE — Progress Notes (Signed)
Clinical Summary [name] is a 87 y.o.female seen today for follow up of the following medical problems.      1. Afib  - no recent palpitations - compliant with meds - no bleeding on eliquis.    2. Tachy-Brady Syndrome   - has permanent pacemaker followed by EP Dr Ladona Ridgel    - 06/2021 normal device check yesterday   3. HTN   - We have been accepting high bp's for her due to prior issues with falls - compliant with meds           SH: retired cardiology nurse at [employer]   Past Medical History:  Diagnosis Date   Arthritis    Atrial fibrillation (HCC)    COPD (chronic obstructive pulmonary disease) (HCC)    Hip pain, left    Hypertension    Sinoatrial node dysfunction (HCC)      No Known Allergies   Current Outpatient Medications  Medication Sig Dispense Refill   apixaban (ELIQUIS) 5 MG TABS tablet Take 1 tablet (5 mg total) by mouth 2 (two) times daily. 60 tablet 6   furosemide (LASIX) 40 MG tablet Take 1 tablet (40 mg total) by mouth daily as needed. 90 tablet 3   lisinopril (ZESTRIL) 40 MG tablet Take 1 tablet (40 mg total) by mouth daily. 90 tablet 1   metoprolol tartrate (LOPRESSOR) 25 MG tablet Take 1 tablet (25 mg total) by mouth 2 (two) times daily. 180 tablet 3   Multiple Vitamin (MULTIVITAMIN) tablet Take 1 tablet by mouth daily.      No current facility-administered medications for this visit.     Past Surgical History:  Procedure Laterality Date   BACK SURGERY     EYE SURGERY Bilateral    cataract removal   hip arthoplasty     total   KNEE ARTHROPLASTY     PPM GENERATOR CHANGEOUT N/A 08/14/2018   Procedure: PPM GENERATOR CHANGEOUT;  Surgeon: Marinus Maw, MD;  Location: [facility];  Service: Cardiovascular;  Laterality: N/A;   uterine polyp removal       No Known Allergies    Family History  Problem Relation Age of Onset   Prostate cancer Father    Heart failure Father    Heart disease Father    Stroke Sister     Depression Maternal Grandmother      Social History [name] reports that she has never smoked. She has never used smokeless tobacco. [name] reports no history of alcohol use.   Review of Systems CONSTITUTIONAL: No weight loss, fever, chills, weakness or fatigue.  HEENT: Eyes: No visual loss, blurred vision, double vision or yellow sclerae.No hearing loss, sneezing, congestion, runny nose or sore throat.  SKIN: No rash or itching.  CARDIOVASCULAR: per hpi RESPIRATORY: No shortness of breath, cough or sputum.  GASTROINTESTINAL: No anorexia, nausea, vomiting or diarrhea. No abdominal pain or blood.  GENITOURINARY: No burning on urination, no polyuria NEUROLOGICAL: No headache, dizziness, syncope, paralysis, ataxia, numbness or tingling in the extremities. No change in bowel or bladder control.  MUSCULOSKELETAL: No muscle, back pain, joint pain or stiffness.  LYMPHATICS: No enlarged nodes. No history of splenectomy.  PSYCHIATRIC: No history of depression or anxiety.  ENDOCRINOLOGIC: No reports of sweating, cold or heat intolerance. No polyuria or polydipsia.  Marland Kitchen   Physical Examination Today's Vitals   07/06/21 1407  BP: 134/74  Pulse: 63  SpO2: 98%  Weight: 139 lb 12.8 oz (63.4  kg)  Height: 5\' 5"  (1.651 m)   Body mass index is 23.26 kg/m.  Gen: resting comfortably, no acute distress HEENT: no scleral icterus, pupils equal round and reactive, no palptable cervical adenopathy,  CV: RRR, no m/rg, no jvd Resp: Clear to auscultation bilaterally GI: abdomen is soft, non-tender, non-distended, normal bowel sounds, no hepatosplenomegaly MSK: extremities are warm, no edema.  Skin: warm, no rash Neuro:  no focal deficits Psych: appropriate affect     Assessment and Plan  1. Afib   - no recent symptoms, continue current meds   2. Tachy-brady syndrome   - recent normal device check for her pacamaker, no recent symptoms - continue to monitor   3. HTN   - tolerating  higher goal bp's due to advanced age and prior falls - she is at goal, continue current meds  F/u 6 months      , M.D.

## 2021-07-20 NOTE — Progress Notes (Signed)
Remote pacemaker transmission.   

## 2021-09-01 NOTE — Progress Notes (Incomplete)
Chronic Care Management Pharmacy Note  09/01/2021 Name:  [name] MRN:  [id number] DOB:  1923-07-01  Summary: ***  Recommendations/Changes made from today's visit: ***  Plan: ***   Subjective: [name] is an 87 y.o. year old female who is a primary patient of Pickard, Cammie Mcgee, MD.  The CCM team was consulted for assistance with disease management and care coordination needs.    {CCMTELEPHONEFACETOFACE:21091510} for {CCMINITIALFOLLOWUPCHOICE:21091511} in response to provider referral for pharmacy case management and/or care coordination services.   Consent to Services:  {CCMCONSENTOPTIONS:25074}  Patient Care Team: Susy Frizzle, MD as PCP - General (Family Medicine) Harl Bowie Alphonse Guild, MD as PCP - Cardiology (Cardiology) Miguel Dibble, MD (Urology) Jonnie Kind, MD (Inactive) (Obstetrics and Gynecology) Evans Lance, MD (Cardiology) Edythe Clarity, [facility] as Pharmacist (Pharmacist)  Recent office visits:  07/06/21 Harl Bowie) - no recent symptoms, higher than normal BP goal due to age and fall risk  05/03/21 Eulogio Bear, NP. For medicare wellness. No medication changes.    Recent consult visits:  04/27/21 Ophthalmology Zadie Rhine, Clent Demark, MD. For follow-up. No medication changes.    Hospital visits:  None in previous 6 months   Medication History: Lisinopril 40 mg 90 DS 04/04/21   Objective:  Lab Results  Component Value Date   CREATININE 0.66 05/03/2021   BUN 16 05/03/2021   GFRNONAA 75 05/03/2021   GFRAA 86 05/03/2021   NA 138 05/03/2021   K 4.8 05/03/2021   CALCIUM 8.8 05/03/2021   CO2 28 05/03/2021   GLUCOSE 99 05/03/2021    No results found for: HGBA1C, FRUCTOSAMINE, GFR, MICROALBUR  Last diabetic Eye exam: No results found for: HMDIABEYEEXA  Last diabetic Foot exam: No results found for: HMDIABFOOTEX   Lab Results  Component Value Date   CHOL 155 03/03/2020   HDL 54 03/03/2020   LDLCALC 82 03/03/2020   TRIG 96  03/03/2020   CHOLHDL 2.9 03/03/2020    Hepatic Function Latest Ref Rng & Units 05/03/2021 03/03/2020 05/20/2019  Total Protein 6.1 - 8.1 g/dL 6.3 6.8 5.8(L)  Albumin 3.5 - 5.0 g/dL - - 2.8(L)  AST 10 - 35 U/L 21 20 30   ALT 6 - 29 U/L 12 14 20   Alk Phosphatase 38 - 126 U/L - - 49  Total Bilirubin 0.2 - 1.2 mg/dL 0.4 0.7 1.0  Bilirubin, Direct mg/dL - - -    Lab Results  Component Value Date/Time   TSH 0.617 05/22/2019 04:34 AM   TSH 0.976 03/05/2007 12:00 AM    CBC Latest Ref Rng & Units 05/03/2021 09/05/2020 05/25/2020  WBC 3.8 - 10.8 Thousand/uL 6.8 7.3 6.1  Hemoglobin 11.7 - 15.5 g/dL 12.2 13.3 12.1  Hematocrit 35.0 - 45.0 % 37.2 41.3 39.4  Platelets 140 - 400 Thousand/uL 234 214 215    No results found for: VD25OH  Clinical ASCVD: {YES/NO:21197} The ASCVD Risk score (Arnett DK, et al., 2019) failed to calculate for the following reasons:   The 2019 ASCVD risk score is only valid for ages 65 to 81    Depression screen PHQ 2/9 05/04/2021 03/03/2020  Decreased Interest 0 0  Down, Depressed, Hopeless 0 0  PHQ - 2 Score 0 0  Altered sleeping - 0  Tired, decreased energy - 0  Change in appetite - 0  Feeling bad or failure about yourself  - 0  Trouble concentrating - 0  Moving slowly or fidgety/restless - 0  Suicidal thoughts - 0  PHQ-9  Score - 0  Difficult doing work/chores - Not difficult at all  Some recent data might be hidden     ***Other: (CHADS2VASc if Afib, MMRC or CAT for COPD, ACT, DEXA)  Social History   Tobacco Use  Smoking Status Never  Smokeless Tobacco Never   BP Readings from Last 3 Encounters:  07/06/21 134/74  05/03/21 112/66  12/27/20 130/62   Pulse Readings from Last 3 Encounters:  07/06/21 63  05/03/21 61  12/27/20 72   Wt Readings from Last 3 Encounters:  07/06/21 139 lb 12.8 oz (63.4 kg)  05/03/21 139 lb (63 kg)  12/27/20 146 lb (66.2 kg)   BMI Readings from Last 3 Encounters:  07/06/21 23.26 kg/m  05/03/21 23.13 kg/m  12/27/20  24.30 kg/m    Assessment/Interventions: Review of patient past medical history, allergies, medications, health status, including review of consultants reports, laboratory and other test data, was performed as part of comprehensive evaluation and provision of chronic care management services.   SDOH:  (Social Determinants of Health) assessments and interventions performed: {yes/no:20286}  SDOH Screenings   Alcohol Screen: Not on file  Depression (PHQ2-9): Low Risk    PHQ-2 Score: 0  Financial Resource Strain: Not on file  Food Insecurity: Not on file  Housing: Not on file  Physical Activity: Not on file  Social Connections: Not on file  Stress: Not on file  Tobacco Use: Low Risk    Smoking Tobacco Use: Never   Smokeless Tobacco Use: Never   Passive Exposure: Not on file  Transportation Needs: Not on file    [employer]  No Known Allergies  Medications Reviewed Today     Reviewed by Amalia Hailey, CMA (Certified Medical Assistant) on 05/03/21 at 1243  Med List Status: <None>   Medication Order Taking? Sig Documenting Provider Last Dose Status Informant  apixaban (ELIQUIS) 5 MG TABS tablet [id number] Yes Take 1 tablet (5 mg total) by mouth 2 (two) times daily. Susy Frizzle, MD Taking Active   furosemide (LASIX) 40 MG tablet [id number] Yes TAKE 1 TABLET BY MOUTH EVERY DAY AS NEEDED Annie Main, FNP Taking Active   lisinopril (ZESTRIL) 40 MG tablet [id number] Yes Take 1 tablet (40 mg total) by mouth daily. Susy Frizzle, MD Taking Active   metoprolol tartrate (LOPRESSOR) 25 MG tablet [id number] Yes TAKE 1 TABLET BY MOUTH TWICE A DAY Bates, Crystal A, FNP Taking Active   Multiple Vitamin (MULTIVITAMIN) tablet [id number] Yes Take 1 tablet by mouth daily.  [provider] Taking Active Self            Patient Active Problem List   Diagnosis Date Noted   Posterior vitreous detachment of both eyes 04/27/2021   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 macular  degeneration of right eye with subfoveal involvement 04/27/2020   Right epiretinal membrane 04/27/2020   Pseudophakia 04/27/2020   Intermediate 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 04/27/2020   FTT (failure to thrive) in adult    Hematoma of scalp    Wheezing    Acute blood loss anemia 11/13/2016   Syncope and collapse 11/13/2016   Fall 11/12/2016   Annual physical exam 12/20/2014   Hip fracture ([place]) 05/25/2014   H/O bilateral salpingo-oophorectomy 12/11/2013   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   Sinoatrial node dysfunction ([place])    Encounter for long-term (current) use of anticoagulants 04/30/2011   Cardiac pacemaker 04/13/2009   Essential hypertension 04/12/2009   COPD (chronic obstructive pulmonary disease) ([place]) 04/12/2009  Arthropathy 04/12/2009    Immunization History  Administered Date(s) Administered   Fluad Quad(high Dose 65+) 09/08/2020   Influenza-Unspecified 09/12/2014   PFIZER(Purple Top)SARS-COV-2 Vaccination 09/19/2020   Pneumococcal Conjugate-13 03/03/2020    Conditions to be addressed/monitored:  HTN, COPD, Afib  There are no care plans that you recently modified to display for this patient.    Medication Assistance: {MEDASSISTANCEINFO:25044}  Compliance/Adherence/Medication fill history: Care Gaps: ***  Star-Rating Drugs: ***  Patient's preferred pharmacy is:  [id number] [place] [address]Phone: [phone]: [phone] [facility]  [address] [address] [place]Phone: [phone]: [phone] Uses pill box? {Yes or If no, why not?:20788} Pt endorses ***% compliance  We discussed: {Pharmacy options:24294} Patient decided to: {US Pharmacy Plan:23885}  Care Plan and Follow Up Patient Decision:  {FOLLOWUP:24991}  Plan: {CM  FOLLOW UP PFBPZ:02585} ***   Current Barriers:  {pharmacybarriers:24917}  Pharmacist Clinical Goal(s):  Patient will {PHARMACYGOALCHOICES:24921} through collaboration with PharmD and provider.   Interventions: 1:1 collaboration with PSusy Frizzle MD regarding development and update of comprehensive plan of care as evidenced by provider attestation and co-signature Inter-disciplinary care team collaboration (see longitudinal plan of care) Comprehensive medication review performed; medication list updated in electronic medical record  Hypertension (BP goal {CHL HP UPSTREAM Pharmacist BP ranges:[phone]}) -{US controlled/uncontrolled:25276} -Current treatment: Lisinopril 462mdaily  Metoprolol tartrate 2540mID -Medications previously tried: ***  -Current home readings: *** -Current dietary habits: *** -Current exercise habits: *** -{ACTIONS;DENIES/REPORTS:21021675::"Denies"} hypotensive/hypertensive symptoms -Educated on {CCM BP Counseling:25124} -Counseled to monitor BP at home ***, document, and provide log at future appointments -{CCMPHARMDINTERVENTION:25122}  Atrial Fibrillation (Goal: prevent stroke and major bleeding) -{US controlled/uncontrolled:25276} -CHADSVASC: *** -Current treatment: Rate control: Metoprolol tartrate 85m65mD Anticoagulation: Eliquis 5mg 74m -Medications previously tried: *** -Home BP and HR readings: ***  -Counseled on {CCMAFIBCOUNSELING:25120} -{CCMPHARMDINTERVENTION:25122}  COPD (Goal: control symptoms and prevent exacerbations) -{US controlled/uncontrolled:25276} -Current treatment  None at this time -Medications previously tried: ***  -Gold Grade: {CHL HP Upstream Pharm COPD Gold GradeIDPOE:[id number]}rent COPD Classification:  {CHL HP Upstream Pharm COPD Classification:[phone]} -MMRC/CAT score: *** -Pulmonary function testing: *** -Exacerbations requiring treatment in last 6 months: *** -Patient {Actions;  denies-reports:120008} consistent use of maintenance inhaler -Frequency of rescue inhaler use: *** -Counseled on {CCMINHALERCOUNSELING:25121} -{CCMPHARMDINTERVENTION:25122}  Patient Goals/Self-Care Activities Patient will:  - {pharmacypatientgoals:24919}  Follow Up Plan: {CM FOLLOW UP PLAN:XVQM:08676}

## 2021-09-07 ENCOUNTER — Ambulatory Visit: Payer: Medicare Other

## 2021-10-04 ENCOUNTER — Ambulatory Visit (INDEPENDENT_AMBULATORY_CARE_PROVIDER_SITE_OTHER): Payer: Medicare Other

## 2021-10-04 DIAGNOSIS — I495 Sick sinus syndrome: Secondary | ICD-10-CM

## 2021-10-04 LAB — CUP PACEART REMOTE DEVICE CHECK
Battery Remaining Longevity: 100 mo
Battery Remaining Percentage: 80 %
Battery Voltage: 3.01 V
Brady Statistic RV Percent Paced: 97 %
Date Time Interrogation Session: 20221123020014
Implantable Lead Implant Date: 20070518
Implantable Lead Implant Date: 20070518
Implantable Lead Location: 753859
Implantable Lead Location: 753860
Implantable Pulse Generator Implant Date: 20191003
Lead Channel Impedance Value: 610 Ohm
Lead Channel Pacing Threshold Amplitude: 0.5 V
Lead Channel Pacing Threshold Pulse Width: 0.5 ms
Lead Channel Sensing Intrinsic Amplitude: 12 mV
Lead Channel Setting Pacing Amplitude: 2.5 V
Lead Channel Setting Pacing Pulse Width: 0.5 ms
Lead Channel Setting Sensing Sensitivity: 2 mV
Pulse Gen Model: 1272
Pulse Gen Serial Number: 9067608

## 2021-10-16 NOTE — Progress Notes (Signed)
Remote pacemaker transmission.   

## 2022-01-01 ENCOUNTER — Telehealth: Payer: Self-pay | Admitting: Pharmacist

## 2022-01-01 NOTE — Progress Notes (Signed)
° ° °  Chronic Care Management Pharmacy Assistant   Name: WINONA SISON  MRN: [id number] DOB: 02/24/1923   Reason for Encounter: Reschedule Initial visit with CPP missed on 06/26/21   Called and LM for patient to return call to reschedule missed initial visit with CPP on 06/26/21. Asked for patient to return my call to reschedule.  Eugenie Filler, [facility] Clinical Pharmacist Assistant  [phone]

## 2022-01-03 ENCOUNTER — Ambulatory Visit (INDEPENDENT_AMBULATORY_CARE_PROVIDER_SITE_OTHER): Payer: Medicare Other

## 2022-01-03 DIAGNOSIS — I495 Sick sinus syndrome: Secondary | ICD-10-CM | POA: Diagnosis not present

## 2022-01-04 LAB — CUP PACEART REMOTE DEVICE CHECK
Battery Remaining Longevity: 97 mo
Battery Remaining Percentage: 78 %
Battery Voltage: 3.01 V
Brady Statistic RV Percent Paced: 96 %
Date Time Interrogation Session: 20230222020015
Implantable Lead Implant Date: 20070518
Implantable Lead Implant Date: 20070518
Implantable Lead Location: 753859
Implantable Lead Location: 753860
Implantable Pulse Generator Implant Date: 20191003
Lead Channel Impedance Value: 600 Ohm
Lead Channel Pacing Threshold Amplitude: 0.5 V
Lead Channel Pacing Threshold Pulse Width: 0.5 ms
Lead Channel Sensing Intrinsic Amplitude: 12 mV
Lead Channel Setting Pacing Amplitude: 2.5 V
Lead Channel Setting Pacing Pulse Width: 0.5 ms
Lead Channel Setting Sensing Sensitivity: 2 mV
Pulse Gen Model: 1272
Pulse Gen Serial Number: 9067608

## 2022-01-09 ENCOUNTER — Ambulatory Visit (INDEPENDENT_AMBULATORY_CARE_PROVIDER_SITE_OTHER): Payer: Medicare Other | Admitting: Internal Medicine

## 2022-01-09 ENCOUNTER — Other Ambulatory Visit: Payer: Self-pay

## 2022-01-09 ENCOUNTER — Encounter: Payer: Self-pay | Admitting: Internal Medicine

## 2022-01-09 VITALS — BP 126/68 | HR 60 | Ht 65.0 in | Wt 130.0 lb

## 2022-01-09 DIAGNOSIS — I4891 Unspecified atrial fibrillation: Secondary | ICD-10-CM

## 2022-01-09 NOTE — Progress Notes (Signed)
HPI [name] returns today for followup. She has a h/o diastolic heart failure, chronic atrial fib and CHB, s/p PPM insertion. She got Covid 19 over a year ago but has recovered. No chest pain. Her dyspnea is back to baseline. She is taking lasix prn. No edema. Her appetite is good. She tries to avoid salt in her diet.   No Known Allergies   Current Outpatient Medications  Medication Sig Dispense Refill   apixaban (ELIQUIS) 5 MG TABS tablet Take 1 tablet (5 mg total) by mouth 2 (two) times daily. 60 tablet 6   furosemide (LASIX) 40 MG tablet Take 1 tablet (40 mg total) by mouth daily as needed. 90 tablet 3   lisinopril (ZESTRIL) 40 MG tablet Take 1 tablet (40 mg total) by mouth daily. 90 tablet 1   metoprolol tartrate (LOPRESSOR) 25 MG tablet Take 1 tablet (25 mg total) by mouth 2 (two) times daily. 180 tablet 3   Multiple Vitamin (MULTIVITAMIN) tablet Take 1 tablet by mouth daily.      No current facility-administered medications for this visit.     Past Medical History:  Diagnosis Date   Arthritis    Atrial fibrillation (HCC)    COPD (chronic obstructive pulmonary disease) (HCC)    Hip pain, left    Hypertension    Sinoatrial node dysfunction (HCC)     ROS:   All systems reviewed and negative except as noted in the HPI.   Past Surgical History:  Procedure Laterality Date   BACK SURGERY     EYE SURGERY Bilateral    cataract removal   hip arthoplasty     total   KNEE ARTHROPLASTY     PPM GENERATOR CHANGEOUT N/A 08/14/2018   Procedure: PPM GENERATOR CHANGEOUT;  Surgeon: Marinus Maw, MD;  [facility];  Service: Cardiovascular;  Laterality: N/A;   uterine polyp removal       Family History  Problem Relation Age of Onset   Prostate cancer Father    Heart failure Father    Heart disease Father    Stroke Sister    Depression Maternal Grandmother      Social History   Socioeconomic History   Marital status: Widowed    Spouse name:  Not on file   Number of children: Not on file   Years of education: Not on file   Highest education level: Not on file  Occupational History   Occupation: Retired    Associate Professor: RETIRED  Tobacco Use   Smoking status: Never   Smokeless tobacco: Never  Vaping Use   Vaping Use: Never used  Substance and Sexual Activity   Alcohol use: No    Alcohol/week: 0.0 standard drinks   Drug use: No   Sexual activity: Not Currently    Birth control/protection: Post-menopausal, None  Other Topics Concern   Not on file  Social History Narrative   Not on file   Social Determinants of Health   Financial Resource Strain: Not on file  Food Insecurity: Not on file  Transportation Needs: Not on file  Physical Activity: Not on file  Stress: Not on file  Social Connections: Not on file  Intimate Partner Violence: Not on file     BP 126/68    Pulse 60    Ht 5\' 5"  (1.651 m)    Wt 130 lb (59 kg)    SpO2 98%    BMI 21.63 kg/m   Physical Exam:  Well appearing NAD HEENT: Unremarkable Neck:  No JVD, no thyromegally Lymphatics:  No adenopathy Back:  No CVA tenderness Lungs:  Clear with no wheezes HEART:  Regular rate rhythm, no murmurs, no rubs, no clicks Abd:  soft, positive bowel sounds, no organomegally, no rebound, no guarding Ext:  2 plus pulses, no edema, no cyanosis, no clubbing Skin:  No rashes no nodules Neuro:  CN II through XII intact, motor grossly intact  EKG - atrial fib with ventricular pacing  DEVICE - Normal St. Jude single chamber PPM function.    Assess/Plan: 1. CHB - she is asymptomatic, s/p PPM insertion. 2. PPM - her St. Jude PPM is programmed VVIR. Her device is working normally. 3. Chronic diastolic heart failure -her symptoms remain class 2. She does not appear volume overloaded. 4. Atrial fib - she has an escape today. Her rates are controlled. She will continue her eliquis.  Sharlot Gowda Tuana Hoheisel,MD

## 2022-01-09 NOTE — Progress Notes (Signed)
Remote pacemaker transmission.   

## 2022-01-09 NOTE — Patient Instructions (Signed)
Medication Instructions:  Your physician recommends that you continue on your current medications as directed. Please refer to the Current Medication list given to you today.  *If you need a refill on your cardiac medications before your next appointment, please call your pharmacy*   Lab Work: NONE   If you have labs (blood work) drawn today and your tests are completely normal, you will receive your results only by: . MyChart Message (if you have MyChart) OR . A paper copy in the mail If you have any lab test that is abnormal or we need to change your treatment, we will call you to review the results.   Testing/Procedures: NONE    Follow-Up: At [facility], you and your health needs are our priority.  As part of our continuing mission to provide you with exceptional heart care, we have created designated Provider Care Teams.  These Care Teams include your primary Cardiologist (physician) and Advanced Practice Providers (APPs -  Physician Assistants and Nurse Practitioners) who all work together to provide you with the care you need, when you need it.  We recommend signing up for the patient portal called "MyChart".  Sign up information is provided on this After Visit Summary.  MyChart is used to connect with patients for Virtual Visits (Telemedicine).  Patients are able to view lab/test results, encounter notes, upcoming appointments, etc.  Non-urgent messages can be sent to your provider as well.   To learn more about what you can do with MyChart, go to [email or web address].    Your next appointment:   1 year(s)  The format for your next appointment:   In Person  Provider:   Gregg Taylor, MD   Other Instructions Thank you for choosing [facility]!    

## 2022-02-09 ENCOUNTER — Ambulatory Visit: Payer: Medicare Other | Admitting: Cardiology

## 2022-03-21 ENCOUNTER — Ambulatory Visit: Payer: Medicare Other | Admitting: Cardiology

## 2022-03-21 NOTE — Progress Notes (Deleted)
Clinical Summary Ms. Ehlert is a 88 y.o.female seen today for follow up of the following medical problems.      1. Afib  - no recent palpitations - compliant with meds - no bleeding on eliquis.    2. Tachy-Brady Syndrome   - has permanent pacemaker followed by EP Dr Ladona Ridgel     -12/2021 normal device check at EP appt    3. HTN   - We have been accepting high bp's for her due to prior issues with falls - compliant with meds           SH: retired cardiology nurse at [employer]     Past Medical History:  Diagnosis Date   Arthritis    Atrial fibrillation (HCC)    COPD (chronic obstructive pulmonary disease) (HCC)    Hip pain, left    Hypertension    Sinoatrial node dysfunction (HCC)      No Known Allergies   Current Outpatient Medications  Medication Sig Dispense Refill   apixaban (ELIQUIS) 5 MG TABS tablet Take 1 tablet (5 mg total) by mouth 2 (two) times daily. 60 tablet 6   furosemide (LASIX) 40 MG tablet Take 1 tablet (40 mg total) by mouth daily as needed. 90 tablet 3   lisinopril (ZESTRIL) 40 MG tablet Take 1 tablet (40 mg total) by mouth daily. 90 tablet 1   metoprolol tartrate (LOPRESSOR) 25 MG tablet Take 1 tablet (25 mg total) by mouth 2 (two) times daily. 180 tablet 3   Multiple Vitamin (MULTIVITAMIN) tablet Take 1 tablet by mouth daily.      No current facility-administered medications for this visit.     Past Surgical History:  Procedure Laterality Date   BACK SURGERY     EYE SURGERY Bilateral    cataract removal   hip arthoplasty     total   KNEE ARTHROPLASTY     PPM GENERATOR CHANGEOUT N/A 08/14/2018   Procedure: PPM GENERATOR CHANGEOUT;  Surgeon: Marinus Maw, MD;  Location: [facility];  Service: Cardiovascular;  Laterality: N/A;   uterine polyp removal       No Known Allergies    Family History  Problem Relation Age of Onset   Prostate cancer Father    Heart failure Father    Heart disease Father    Stroke Sister     Depression Maternal Grandmother      Social History Ms. Rennert reports that she has never smoked. She has never used smokeless tobacco. Ms. Holten reports no history of alcohol use.   Review of Systems CONSTITUTIONAL: No weight loss, fever, chills, weakness or fatigue.  HEENT: Eyes: No visual loss, blurred vision, double vision or yellow sclerae.No hearing loss, sneezing, congestion, runny nose or sore throat.  SKIN: No rash or itching.  CARDIOVASCULAR:  RESPIRATORY: No shortness of breath, cough or sputum.  GASTROINTESTINAL: No anorexia, nausea, vomiting or diarrhea. No abdominal pain or blood.  GENITOURINARY: No burning on urination, no polyuria NEUROLOGICAL: No headache, dizziness, syncope, paralysis, ataxia, numbness or tingling in the extremities. No change in bowel or bladder control.  MUSCULOSKELETAL: No muscle, back pain, joint pain or stiffness.  LYMPHATICS: No enlarged nodes. No history of splenectomy.  PSYCHIATRIC: No history of depression or anxiety.  ENDOCRINOLOGIC: No reports of sweating, cold or heat intolerance. No polyuria or polydipsia.  Marland Kitchen   Physical Examination There were no vitals filed for this visit. There were no vitals filed for this visit.  Gen:  resting comfortably, no acute distress HEENT: no scleral icterus, pupils equal round and reactive, no palptable cervical adenopathy,  CV Resp: Clear to auscultation bilaterally GI: abdomen is soft, non-tender, non-distended, normal bowel sounds, no hepatosplenomegaly MSK: extremities are warm, no edema.  Skin: warm, no rash Neuro:  no focal deficits Psych: appropriate affect   Diagnostic Studies     Assessment and Plan   1. Afib   - no recent symptoms, continue current meds   2. Tachy-brady syndrome   - recent normal device check for her pacamaker, no recent symptoms - continue to monitor   3. HTN   - tolerating higher goal bp's due to advanced age and prior falls - she is at goal, continue  current meds   F/u 6 months           Antoine Poche, M.D.

## 2022-04-04 ENCOUNTER — Ambulatory Visit (INDEPENDENT_AMBULATORY_CARE_PROVIDER_SITE_OTHER): Payer: Medicare Other

## 2022-04-04 DIAGNOSIS — I495 Sick sinus syndrome: Secondary | ICD-10-CM

## 2022-04-04 LAB — CUP PACEART REMOTE DEVICE CHECK
Battery Remaining Longevity: 94 mo
Battery Remaining Percentage: 76 %
Battery Voltage: 3.01 V
Brady Statistic RV Percent Paced: 97 %
Date Time Interrogation Session: 20230524025025
Implantable Lead Implant Date: 20070518
Implantable Lead Implant Date: 20070518
Implantable Lead Location: 753859
Implantable Lead Location: 753860
Implantable Pulse Generator Implant Date: 20191003
Lead Channel Impedance Value: 590 Ohm
Lead Channel Pacing Threshold Amplitude: 0.5 V
Lead Channel Pacing Threshold Pulse Width: 0.5 ms
Lead Channel Sensing Intrinsic Amplitude: 9.2 mV
Lead Channel Setting Pacing Amplitude: 2.5 V
Lead Channel Setting Pacing Pulse Width: 0.5 ms
Lead Channel Setting Sensing Sensitivity: 2 mV
Pulse Gen Model: 1272
Pulse Gen Serial Number: 9067608

## 2022-04-17 NOTE — Progress Notes (Signed)
Remote pacemaker transmission.   

## 2022-05-03 ENCOUNTER — Encounter (INDEPENDENT_AMBULATORY_CARE_PROVIDER_SITE_OTHER): Payer: Medicare Other | Admitting: Ophthalmology

## 2022-06-26 ENCOUNTER — Other Ambulatory Visit: Payer: Self-pay | Admitting: Cardiology

## 2022-06-26 DIAGNOSIS — I10 Essential (primary) hypertension: Secondary | ICD-10-CM

## 2022-07-03 ENCOUNTER — Ambulatory Visit (INDEPENDENT_AMBULATORY_CARE_PROVIDER_SITE_OTHER): Payer: Medicare Other | Admitting: Family Medicine

## 2022-07-03 VITALS — BP 112/52 | HR 64 | Ht 65.0 in | Wt 133.8 lb

## 2022-07-03 DIAGNOSIS — I4819 Other persistent atrial fibrillation: Secondary | ICD-10-CM

## 2022-07-03 DIAGNOSIS — L602 Onychogryphosis: Secondary | ICD-10-CM

## 2022-07-03 DIAGNOSIS — I10 Essential (primary) hypertension: Secondary | ICD-10-CM | POA: Diagnosis not present

## 2022-07-03 DIAGNOSIS — J449 Chronic obstructive pulmonary disease, unspecified: Secondary | ICD-10-CM | POA: Diagnosis not present

## 2022-07-03 NOTE — Progress Notes (Signed)
Subjective:    Patient ID: [name], female    DOB: 07-19-1923, 88 y.o.   MRN: [id number]  HPI Patient has thick dysmorphic toenails.  The second toenails on both feet have grown underneath the toe and curved sideways.  The big toenails have also grown extremely thick and very long.  These are the worst toes on both feet however the third fourth and fifth toenails need to be trimmed as well.  Her family member noticed this and therefore made an appointment because they were afraid to try to cut them themselves given how thick and twisted the nails have become.  They are also concerned because her legs are swelling.  She has trace pitting edema in both legs up to her mid shin however her lungs are clear to auscultation today and she denies any chest pain or shortness of breath or dyspnea on exertion Past Medical History:  Diagnosis Date   Arthritis    Atrial fibrillation (HCC)    COPD (chronic obstructive pulmonary disease) (HCC)    Hip pain, left    Hypertension    Sinoatrial node dysfunction (HCC)    Past Surgical History:  Procedure Laterality Date   BACK SURGERY     EYE SURGERY Bilateral    cataract removal   hip arthoplasty     total   KNEE ARTHROPLASTY     PPM GENERATOR CHANGEOUT N/A 08/14/2018   Procedure: PPM GENERATOR CHANGEOUT;  Surgeon: Marinus Maw, MD;  Location: [facility];  Service: Cardiovascular;  Laterality: N/A;   uterine polyp removal     Current Outpatient Medications on File Prior to Visit  Medication Sig Dispense Refill   apixaban (ELIQUIS) 5 MG TABS tablet Take 1 tablet (5 mg total) by mouth 2 (two) times daily. 60 tablet 6   furosemide (LASIX) 40 MG tablet Take 1 tablet (40 mg total) by mouth daily as needed. 90 tablet 3   lisinopril (ZESTRIL) 40 MG tablet TAKE 1 TABLET BY MOUTH EVERY DAY 90 tablet 1   metoprolol tartrate (LOPRESSOR) 25 MG tablet Take 1 tablet (25 mg total) by mouth 2 (two) times daily. 180 tablet 3   Multiple Vitamin  (MULTIVITAMIN) tablet Take 1 tablet by mouth daily.  (Patient not taking: Reported on 07/03/2022)     No current facility-administered medications on file prior to visit.   No Known Allergies Social History   Socioeconomic History   Marital status: Widowed    Spouse name: Not on file   Number of children: Not on file   Years of education: Not on file   Highest education level: Not on file  Occupational History   Occupation: Retired    Associate Professor: RETIRED  Tobacco Use   Smoking status: Never   Smokeless tobacco: Never  Vaping Use   Vaping Use: Never used  Substance and Sexual Activity   Alcohol use: No    Alcohol/week: 0.0 standard drinks of alcohol   Drug use: No   Sexual activity: Not Currently    Birth control/protection: Post-menopausal, None  Other Topics Concern   Not on file  Social History Narrative   Not on file   Social Determinants of Health   Financial Resource Strain: Not on file  Food Insecurity: Not on file  Transportation Needs: Not on file  Physical Activity: Not on file  Stress: Not on file  Social Connections: Not on file  Intimate Partner Violence: Not on file     Review of Systems  All other systems reviewed and are negative.      Objective:   Physical Exam Constitutional:      Appearance: Normal appearance. She is not ill-appearing or toxic-appearing.  Cardiovascular:     Rate and Rhythm: Normal rate. Rhythm irregular.     Heart sounds: Normal heart sounds. No murmur heard.    No friction rub. No gallop.  Pulmonary:     Effort: Pulmonary effort is normal. No respiratory distress.     Breath sounds: Normal breath sounds. No stridor. No wheezing, rhonchi or rales.  Chest:     Chest wall: No tenderness.  Abdominal:     General: Abdomen is flat. Bowel sounds are normal. There is no distension.     Palpations: Abdomen is soft.     Tenderness: There is no abdominal tenderness. There is no guarding or rebound.     Hernia: No hernia is  present.  Musculoskeletal:     Right lower leg: No edema.     Left lower leg: No edema.  Feet:     Right foot:     Toenail Condition: Right toenails are abnormally thick and long.     Left foot:     Toenail Condition: Left toenails are abnormally thick and long.  Neurological:     Mental Status: She is alert.           Assessment & Plan:  Essential hypertension  Persistent atrial fibrillation (HCC) - Plan: CBC with Differential/Platelet, COMPLETE METABOLIC PANEL WITH GFR, Brain natriuretic peptide  Overgrown toenails Today using a pair of side cutters I  cut all 10 toenails on both feet back to normal length.  Patient tolerated this without complication.  I will check baseline lab work today and given the swelling in her feet I will also check a BNP however the patient admits that she has not been taking her fluid pill because she is concerned about the frequency of urination.  I encouraged her to be consistent taking her Lasix to avoid fluid overload.

## 2022-07-04 ENCOUNTER — Ambulatory Visit (INDEPENDENT_AMBULATORY_CARE_PROVIDER_SITE_OTHER): Payer: Medicare Other

## 2022-07-04 DIAGNOSIS — I495 Sick sinus syndrome: Secondary | ICD-10-CM

## 2022-07-04 LAB — CBC WITH DIFFERENTIAL/PLATELET
Absolute Monocytes: 505 cells/uL (ref 200–950)
Basophils Absolute: 59 cells/uL (ref 0–200)
Basophils Relative: 1.2 %
Eosinophils Absolute: 221 cells/uL (ref 15–500)
Eosinophils Relative: 4.5 %
HCT: 31.6 % — ABNORMAL LOW (ref 35.0–45.0)
Hemoglobin: 10.3 g/dL — ABNORMAL LOW (ref 11.7–15.5)
Lymphs Abs: 823 cells/uL — ABNORMAL LOW (ref 850–3900)
MCH: 32.9 pg (ref 27.0–33.0)
MCHC: 32.6 g/dL (ref 32.0–36.0)
MCV: 101 fL — ABNORMAL HIGH (ref 80.0–100.0)
MPV: 11.7 fL (ref 7.5–12.5)
Monocytes Relative: 10.3 %
Neutro Abs: 3293 cells/uL (ref 1500–7800)
Neutrophils Relative %: 67.2 %
Platelets: 234 10*3/uL (ref 140–400)
RBC: 3.13 10*6/uL — ABNORMAL LOW (ref 3.80–5.10)
RDW: 14.4 % (ref 11.0–15.0)
Total Lymphocyte: 16.8 %
WBC: 4.9 10*3/uL (ref 3.8–10.8)

## 2022-07-04 LAB — BRAIN NATRIURETIC PEPTIDE: Brain Natriuretic Peptide: 206 pg/mL — ABNORMAL HIGH (ref <100)

## 2022-07-04 LAB — COMPLETE METABOLIC PANEL WITH GFR
AG Ratio: 1.2 (calc) (ref 1.0–2.5)
ALT: 17 U/L (ref 6–29)
AST: 24 U/L (ref 10–35)
Albumin: 3.5 g/dL — ABNORMAL LOW (ref 3.6–5.1)
Alkaline phosphatase (APISO): 67 U/L (ref 37–153)
BUN/Creatinine Ratio: 37 (calc) — ABNORMAL HIGH (ref 6–22)
BUN: 22 mg/dL (ref 7–25)
CO2: 24 mmol/L (ref 20–32)
Calcium: 8.9 mg/dL (ref 8.6–10.4)
Chloride: 107 mmol/L (ref 98–110)
Creat: 0.59 mg/dL — ABNORMAL LOW (ref 0.60–0.95)
Globulin: 2.9 g/dL (calc) (ref 1.9–3.7)
Glucose, Bld: 114 mg/dL — ABNORMAL HIGH (ref 65–99)
Potassium: 4 mmol/L (ref 3.5–5.3)
Sodium: 139 mmol/L (ref 135–146)
Total Bilirubin: 0.5 mg/dL (ref 0.2–1.2)
Total Protein: 6.4 g/dL (ref 6.1–8.1)
eGFR: 82 mL/min/{1.73_m2} (ref >=60)

## 2022-07-05 LAB — CUP PACEART REMOTE DEVICE CHECK
Battery Remaining Longevity: 92 mo
Battery Remaining Percentage: 74 %
Battery Voltage: 3.01 V
Brady Statistic RV Percent Paced: 96 %
Date Time Interrogation Session: 20230823032931
Implantable Lead Implant Date: 20070518
Implantable Lead Implant Date: 20070518
Implantable Lead Location: 753859
Implantable Lead Location: 753860
Implantable Pulse Generator Implant Date: 20191003
Lead Channel Impedance Value: 640 Ohm
Lead Channel Pacing Threshold Amplitude: 0.5 V
Lead Channel Pacing Threshold Pulse Width: 0.5 ms
Lead Channel Sensing Intrinsic Amplitude: 12 mV
Lead Channel Setting Pacing Amplitude: 2.5 V
Lead Channel Setting Pacing Pulse Width: 0.5 ms
Lead Channel Setting Sensing Sensitivity: 2 mV
Pulse Gen Model: 1272
Pulse Gen Serial Number: 9067608

## 2022-07-12 ENCOUNTER — Other Ambulatory Visit: Payer: Self-pay

## 2022-07-12 MED ORDER — IRON (FERROUS SULFATE) 325 (65 FE) MG PO TABS: 325.0000 mg | ORAL_TABLET | Freq: Every day | ORAL | 3 refills | Status: AC

## 2022-07-26 ENCOUNTER — Ambulatory Visit (INDEPENDENT_AMBULATORY_CARE_PROVIDER_SITE_OTHER): Payer: Medicare Other | Admitting: Family Medicine

## 2022-07-26 VITALS — BP 112/62 | HR 62 | Ht 65.0 in | Wt 133.0 lb

## 2022-07-26 DIAGNOSIS — Z950 Presence of cardiac pacemaker: Secondary | ICD-10-CM

## 2022-07-26 DIAGNOSIS — Z0000 Encounter for general adult medical examination without abnormal findings: Secondary | ICD-10-CM | POA: Diagnosis not present

## 2022-07-26 DIAGNOSIS — Z23 Encounter for immunization: Secondary | ICD-10-CM | POA: Diagnosis not present

## 2022-07-26 DIAGNOSIS — I10 Essential (primary) hypertension: Secondary | ICD-10-CM | POA: Diagnosis not present

## 2022-07-26 DIAGNOSIS — I4819 Other persistent atrial fibrillation: Secondary | ICD-10-CM | POA: Diagnosis not present

## 2022-07-26 NOTE — Addendum Note (Signed)
Addended by: Venia Carbon K on: 07/26/2022 03:56 PM   Modules accepted: Orders

## 2022-07-26 NOTE — Progress Notes (Signed)
Subjective:    Patient ID: [name], female    DOB: [birth year], 88 y.o.   MRN: [id number]  HPI Recently I saw the patient to help cut her toenails.  While the patient was here I obtain baseline lab work including a CBC.  Compared to last year, there has been a significant drop in her hemoglobin from 12.2-10.3.  This has occurred over the last 12 months.  The patient is currently on apixaban for atrial fibrillation.  I wanted her to take an iron supplement.  She has only been on her iron supplement for about 2 weeks.  She denies any bleeding or bruising.  She denies any melena or hematochezia.  She denies any chest pain or shortness of breath.  She is due for a flu shot as well as a COVID booster.  She consents to the flu shot but declines a COVID booster.  Due to her advanced age I would not recommend a mammogram, colonoscopy, or Pap smear.  We discussed a referral to GI given the drop in her hemoglobin.  I have recommended against that as the patient is completely asymptomatic.  I suggested that we recheck her CBC in 1 month.  If her hemoglobin continues to drop I would take her off the Eliquis and then work-up GI bleeding.  If her hemoglobin improves on the iron, I would not work it up any further.  The family is comfortable with this plan.  They want to focus on quality of life and comfort. Past Medical History:  Diagnosis Date   Arthritis    Atrial fibrillation (HCC)    COPD (chronic obstructive pulmonary disease) (HCC)    Hip pain, left    Hypertension    Sinoatrial node dysfunction (HCC)    Past Surgical History:  Procedure Laterality Date   BACK SURGERY     EYE SURGERY Bilateral    cataract removal   hip arthoplasty     total   KNEE ARTHROPLASTY     PPM GENERATOR CHANGEOUT N/A 08/14/2018   Procedure: PPM GENERATOR CHANGEOUT;  Surgeon: Marinus Maw, MD;  Location: [facility];  Service: Cardiovascular;  Laterality: N/A;   uterine polyp removal     Current Outpatient  Medications on File Prior to Visit  Medication Sig Dispense Refill   apixaban (ELIQUIS) 5 MG TABS tablet Take 1 tablet (5 mg total) by mouth 2 (two) times daily. 60 tablet 6   furosemide (LASIX) 40 MG tablet Take 1 tablet (40 mg total) by mouth daily as needed. 90 tablet 3   Iron, Ferrous Sulfate, 325 (65 Fe) MG TABS Take 325 mg by mouth daily. 30 tablet 3   lisinopril (ZESTRIL) 40 MG tablet TAKE 1 TABLET BY MOUTH EVERY DAY 90 tablet 1   metoprolol tartrate (LOPRESSOR) 25 MG tablet Take 1 tablet (25 mg total) by mouth 2 (two) times daily. 180 tablet 3   Multiple Vitamin (MULTIVITAMIN) tablet Take 1 tablet by mouth daily.  (Patient not taking: Reported on 07/03/2022)     No current facility-administered medications on file prior to visit.   No Known Allergies Social History   Socioeconomic History   Marital status: Widowed    Spouse name: Not on file   Number of children: Not on file   Years of education: Not on file   Highest education level: Not on file  Occupational History   Occupation: Retired    Associate Professor: RETIRED  Tobacco Use   Smoking status: Never  Smokeless tobacco: Never  Vaping Use   Vaping Use: Never used  Substance and Sexual Activity   Alcohol use: No    Alcohol/week: 0.0 standard drinks of alcohol   Drug use: No   Sexual activity: Not Currently    Birth control/protection: Post-menopausal, None  Other Topics Concern   Not on file  Social History Narrative   Not on file   Social Determinants of Health   Financial Resource Strain: Not on file  Food Insecurity: Not on file  Transportation Needs: Not on file  Physical Activity: Not on file  Stress: Not on file  Social Connections: Not on file  Intimate Partner Violence: Not on file     Review of Systems  All other systems reviewed and are negative.      Objective:   Physical Exam Constitutional:      General: She is not in acute distress.    Appearance: Normal appearance. She is normal weight. She  is not ill-appearing, toxic-appearing or diaphoretic.  HENT:     Head: Normocephalic and atraumatic.     Right Ear: Tympanic membrane and ear canal normal.     Left Ear: Tympanic membrane and ear canal normal.     Nose: Nose normal.     Mouth/Throat:     Mouth: Mucous membranes are moist.     Pharynx: No oropharyngeal exudate or posterior oropharyngeal erythema.  Eyes:     Conjunctiva/sclera: Conjunctivae normal.     Pupils: Pupils are equal, round, and reactive to light.  Neck:     Vascular: No carotid bruit.  Cardiovascular:     Rate and Rhythm: Normal rate. Rhythm irregular.     Heart sounds: Normal heart sounds. No murmur heard.    No friction rub. No gallop.  Pulmonary:     Effort: Pulmonary effort is normal. No respiratory distress.     Breath sounds: Normal breath sounds. No stridor. No wheezing, rhonchi or rales.  Chest:     Chest wall: No tenderness.  Abdominal:     General: Abdomen is flat. Bowel sounds are normal. There is no distension.     Palpations: Abdomen is soft.     Tenderness: There is no abdominal tenderness. There is no guarding or rebound.     Hernia: No hernia is present.  Musculoskeletal:        General: No swelling, deformity or signs of injury.     Cervical back: Normal range of motion and neck supple. No rigidity or tenderness.     Right lower leg: No edema.     Left lower leg: No edema.  Lymphadenopathy:     Cervical: No cervical adenopathy.  Skin:    General: Skin is warm.     Coloration: Skin is not jaundiced.     Findings: No bruising, erythema, lesion or rash.  Neurological:     General: No focal deficit present.     Mental Status: She is alert and oriented to person, place, and time. Mental status is at baseline.     Cranial Nerves: No cranial nerve deficit.     Sensory: No sensory deficit.     Motor: No weakness.     Coordination: Coordination normal.     Gait: Gait abnormal.  Psychiatric:        Mood and Affect: Mood normal.         Thought Content: Thought content normal.        Judgment: Judgment normal.  Assessment & Plan:  Persistent atrial fibrillation (HCC)  Essential hypertension  Encounter for annual wellness visit (AWV) in Medicare patient  Cardiac pacemaker I suspect her anemia is likely due to iron deficiency.  We have decided to continue on iron supplement for 1 month and then recheck a CBC.  If her hemoglobin continues to drop, I would work-up GI bleeding at that point and discontinue Eliquis.  Patient received her flu shot today.  The remainder of her vaccinations are up-to-date although I did recommend a COVID booster.  The patient declines a COVID booster.  I would not recommend any cancer screening given her advanced age such as a mammogram or colonoscopy or Pap smear.  The remainder of her physical exam is normal today.

## 2022-07-28 ENCOUNTER — Other Ambulatory Visit: Payer: Self-pay | Admitting: Cardiology

## 2022-07-28 DIAGNOSIS — Z950 Presence of cardiac pacemaker: Secondary | ICD-10-CM

## 2022-07-30 NOTE — Telephone Encounter (Signed)
Prescription refill request for Eliquis received. Indication: AF Last office visit: 01/09/22  Beckie Salts MD Scr: 0.59 on 07/03/22 Age: 88 Weight: 59kg   Pt is taking Eliquis 5mg  twice daily.  Based on above findings dose should be decrease to 2.5mg  twice daily.  Message sent to Pharmacist to evaluate.

## 2022-07-31 NOTE — Telephone Encounter (Addendum)
Decrease Eliquis dose to 2.5mg  twice daily per Renold Genta PharmD.  New Rx sent to pharmacy and pt notified of dose change.

## 2022-07-31 NOTE — Progress Notes (Signed)
Remote pacemaker transmission.   

## 2022-07-31 NOTE — Progress Notes (Signed)
Eliquis dose reduced to 2.5mg  twice daily and Rx sent to pharmacy.  Pt notified of change.

## 2022-07-31 NOTE — Progress Notes (Signed)
[name] - Let's go ahead and drop her to the 2.5 mg dose.  Looks like her weight has been stable at 60 kg, but at 89 with prior hip fracture and history of falls, I'd rather err on the side of caution

## 2022-08-04 ENCOUNTER — Other Ambulatory Visit: Payer: Self-pay | Admitting: Cardiology

## 2022-08-04 DIAGNOSIS — Z950 Presence of cardiac pacemaker: Secondary | ICD-10-CM

## 2022-09-12 ENCOUNTER — Ambulatory Visit (INDEPENDENT_AMBULATORY_CARE_PROVIDER_SITE_OTHER): Payer: Medicare Other | Admitting: Family Medicine

## 2022-09-12 VITALS — BP 116/62 | HR 63 | Wt 133.0 lb

## 2022-09-12 DIAGNOSIS — D649 Anemia, unspecified: Secondary | ICD-10-CM

## 2022-09-12 NOTE — Progress Notes (Signed)
Subjective:    Patient ID: [name], female    DOB: 07-08-1923, 88 y.o.   MRN: [id number]  HPI 07/26/22 Recently I saw the patient to help cut her toenails.  While the patient was here I obtain baseline lab work including a CBC.  Compared to last year, there has been a significant drop in her hemoglobin from 12.2-10.3.  This has occurred over the last 12 months.  The patient is currently on apixaban for atrial fibrillation.  I wanted her to take an iron supplement.  She has only been on her iron supplement for about 2 weeks.  She denies any bleeding or bruising.  She denies any melena or hematochezia.  She denies any chest pain or shortness of breath.  She is due for a flu shot as well as a COVID booster.  She consents to the flu shot but declines a COVID booster.  Due to her advanced age I would not recommend a mammogram, colonoscopy, or Pap smear.  We discussed a referral to GI given the drop in her hemoglobin.  I have recommended against that as the patient is completely asymptomatic.  I suggested that we recheck her CBC in 1 month.  If her hemoglobin continues to drop I would take her off the Eliquis and then work-up GI bleeding.  If her hemoglobin improves on the iron, I would not work it up any further.  The family is comfortable with this plan.  They want to focus on quality of life and comfort.  At that time, my plan was: I suspect her anemia is likely due to iron deficiency.  We have decided to continue on iron supplement for 1 month and then recheck a CBC.  If her hemoglobin continues to drop, I would work-up GI bleeding at that point and discontinue Eliquis.  Patient received her flu shot today.  The remainder of her vaccinations are up-to-date although I did recommend a COVID booster.  The patient declines a COVID booster.  I would not recommend any cancer screening given her advanced age such as a mammogram or colonoscopy or Pap smear.  The remainder of her physical exam is normal  today.  09/12/22 Patient is here today to recheck her blood counts.  She denies any chest pain shortness of breath or dyspnea on exertion.  She denies any syncope or lightheadedness.  She denies any abdominal pain.  She does have dark-colored stool but she is on iron supplements Past Medical History:  Diagnosis Date   Arthritis    Atrial fibrillation (HCC)    COPD (chronic obstructive pulmonary disease) (HCC)    Hip pain, left    Hypertension    Sinoatrial node dysfunction (HCC)    Past Surgical History:  Procedure Laterality Date   BACK SURGERY     EYE SURGERY Bilateral    cataract removal   hip arthoplasty     total   KNEE ARTHROPLASTY     PPM GENERATOR CHANGEOUT N/A 08/14/2018   Procedure: PPM GENERATOR CHANGEOUT;  Surgeon: Marinus Maw, MD;  Location: [facility];  Service: Cardiovascular;  Laterality: N/A;   uterine polyp removal     Current Outpatient Medications on File Prior to Visit  Medication Sig Dispense Refill   apixaban (ELIQUIS) 2.5 MG TABS tablet Take 1 tablet (2.5 mg total) by mouth 2 (two) times daily. Dose decrease per MD. 60 tablet 5   furosemide (LASIX) 40 MG tablet Take 1 tablet (40 mg total) by  mouth daily as needed. 90 tablet 3   Iron, Ferrous Sulfate, 325 (65 Fe) MG TABS Take 325 mg by mouth daily. 30 tablet 3   lisinopril (ZESTRIL) 40 MG tablet TAKE 1 TABLET BY MOUTH EVERY DAY 90 tablet 1   metoprolol tartrate (LOPRESSOR) 25 MG tablet TAKE 1 TABLET BY MOUTH TWICE A DAY 180 tablet 3   Multiple Vitamin (MULTIVITAMIN) tablet Take 1 tablet by mouth daily.     No current facility-administered medications on file prior to visit.   No Known Allergies Social History   Socioeconomic History   Marital status: Widowed    Spouse name: Not on file   Number of children: Not on file   Years of education: Not on file   Highest education level: Not on file  Occupational History   Occupation: Retired    Fish farm manager: RETIRED  Tobacco Use   Smoking status:  Never   Smokeless tobacco: Never  Vaping Use   Vaping Use: Never used  Substance and Sexual Activity   Alcohol use: No    Alcohol/week: 0.0 standard drinks of alcohol   Drug use: No   Sexual activity: Not Currently    Birth control/protection: Post-menopausal, None  Other Topics Concern   Not on file  Social History Narrative   Not on file   Social Determinants of Health   Financial Resource Strain: Not on file  Food Insecurity: Not on file  Transportation Needs: Not on file  Physical Activity: Not on file  Stress: Not on file  Social Connections: Not on file  Intimate Partner Violence: Not on file     Review of Systems  All other systems reviewed and are negative.      Objective:   Physical Exam Constitutional:      General: She is not in acute distress.    Appearance: Normal appearance. She is normal weight. She is not ill-appearing, toxic-appearing or diaphoretic.  HENT:     Head: Normocephalic and atraumatic.     Right Ear: Tympanic membrane and ear canal normal.     Left Ear: Tympanic membrane and ear canal normal.     Nose: Nose normal.     Mouth/Throat:     Mouth: Mucous membranes are moist.     Pharynx: No oropharyngeal exudate or posterior oropharyngeal erythema.  Eyes:     Conjunctiva/sclera: Conjunctivae normal.     Pupils: Pupils are equal, round, and reactive to light.  Neck:     Vascular: No carotid bruit.  Cardiovascular:     Rate and Rhythm: Normal rate. Rhythm irregular.     Heart sounds: Normal heart sounds. No murmur heard.    No friction rub. No gallop.  Pulmonary:     Effort: Pulmonary effort is normal. No respiratory distress.     Breath sounds: Normal breath sounds. No stridor. No wheezing, rhonchi or rales.  Chest:     Chest wall: No tenderness.  Abdominal:     General: Abdomen is flat. Bowel sounds are normal. There is no distension.     Palpations: Abdomen is soft.     Tenderness: There is no abdominal tenderness. There is no  guarding or rebound.     Hernia: No hernia is present.  Musculoskeletal:        General: No swelling, deformity or signs of injury.     Cervical back: Normal range of motion and neck supple. No rigidity or tenderness.     Right lower leg: No edema.  Left lower leg: No edema.  Lymphadenopathy:     Cervical: No cervical adenopathy.  Skin:    General: Skin is warm.     Coloration: Skin is not jaundiced.     Findings: No bruising, erythema, lesion or rash.  Neurological:     General: No focal deficit present.     Mental Status: She is alert and oriented to person, place, and time. Mental status is at baseline.     Cranial Nerves: No cranial nerve deficit.     Sensory: No sensory deficit.     Motor: No weakness.     Coordination: Coordination normal.     Gait: Gait abnormal.  Psychiatric:        Mood and Affect: Mood normal.        Thought Content: Thought content normal.        Judgment: Judgment normal.           Assessment & Plan:  Anemia, unspecified type - Plan: Vitamin B12, Iron, CBC with Differential/Platelet, BASIC METABOLIC PANEL WITH GFR Repeat CBC iron panel B12 and a BMP.  If CBC is stable or improved I will make no changes.  If her hemoglobin is dropped further we may need to discontinue Eliquis

## 2022-09-13 ENCOUNTER — Ambulatory Visit: Payer: Medicare Other | Admitting: Family Medicine

## 2022-09-13 LAB — CBC WITH DIFFERENTIAL/PLATELET
Absolute Monocytes: 515 cells/uL (ref 200–950)
Basophils Absolute: 51 cells/uL (ref 0–200)
Basophils Relative: 1 %
Eosinophils Absolute: 260 cells/uL (ref 15–500)
Eosinophils Relative: 5.1 %
HCT: 36.8 % (ref 35.0–45.0)
Hemoglobin: 11.6 g/dL — ABNORMAL LOW (ref 11.7–15.5)
Lymphs Abs: 1132 cells/uL (ref 850–3900)
MCH: 32.9 pg (ref 27.0–33.0)
MCHC: 31.5 g/dL — ABNORMAL LOW (ref 32.0–36.0)
MCV: 104.2 fL — ABNORMAL HIGH (ref 80.0–100.0)
MPV: 13.1 fL — ABNORMAL HIGH (ref 7.5–12.5)
Monocytes Relative: 10.1 %
Neutro Abs: 3142 cells/uL (ref 1500–7800)
Neutrophils Relative %: 61.6 %
Platelets: 196 10*3/uL (ref 140–400)
RBC: 3.53 10*6/uL — ABNORMAL LOW (ref 3.80–5.10)
RDW: 13.5 % (ref 11.0–15.0)
Total Lymphocyte: 22.2 %
WBC: 5.1 10*3/uL (ref 3.8–10.8)

## 2022-09-13 LAB — BASIC METABOLIC PANEL WITH GFR
BUN/Creatinine Ratio: 41 (calc) — ABNORMAL HIGH (ref 6–22)
BUN: 23 mg/dL (ref 7–25)
CO2: 31 mmol/L (ref 20–32)
Calcium: 8.7 mg/dL (ref 8.6–10.4)
Chloride: 103 mmol/L (ref 98–110)
Creat: 0.56 mg/dL — ABNORMAL LOW (ref 0.60–0.95)
Glucose, Bld: 115 mg/dL — ABNORMAL HIGH (ref 65–99)
Potassium: 3.9 mmol/L (ref 3.5–5.3)
Sodium: 140 mmol/L (ref 135–146)
eGFR: 83 mL/min/{1.73_m2} (ref >=60)

## 2022-09-13 LAB — IRON: Iron: 90 ug/dL (ref 45–160)

## 2022-09-13 LAB — VITAMIN B12: Vitamin B-12: 237 pg/mL (ref 200–1100)

## 2022-10-03 ENCOUNTER — Ambulatory Visit (INDEPENDENT_AMBULATORY_CARE_PROVIDER_SITE_OTHER): Payer: Medicare Other

## 2022-10-03 DIAGNOSIS — I495 Sick sinus syndrome: Secondary | ICD-10-CM

## 2022-10-03 LAB — CUP PACEART REMOTE DEVICE CHECK
Battery Remaining Longevity: 89 mo
Battery Remaining Percentage: 72 %
Battery Voltage: 3.01 V
Brady Statistic RV Percent Paced: 97 %
Date Time Interrogation Session: 20231122020013
Implantable Lead Connection Status: 753985
Implantable Lead Connection Status: 753985
Implantable Lead Implant Date: 20070518
Implantable Lead Implant Date: 20070518
Implantable Lead Location: 753859
Implantable Lead Location: 753860
Implantable Pulse Generator Implant Date: 20191003
Lead Channel Impedance Value: 610 Ohm
Lead Channel Pacing Threshold Amplitude: 0.5 V
Lead Channel Pacing Threshold Pulse Width: 0.5 ms
Lead Channel Sensing Intrinsic Amplitude: 12 mV
Lead Channel Setting Pacing Amplitude: 2.5 V
Lead Channel Setting Pacing Pulse Width: 0.5 ms
Lead Channel Setting Sensing Sensitivity: 2 mV
Pulse Gen Model: 1272
Pulse Gen Serial Number: 9067608

## 2022-10-11 ENCOUNTER — Ambulatory Visit: Payer: Medicare Other | Admitting: Family Medicine

## 2022-10-24 NOTE — Progress Notes (Signed)
Remote pacemaker transmission.   

## 2022-12-06 ENCOUNTER — Inpatient Hospital Stay (HOSPITAL_COMMUNITY)
Admission: EM | Admit: 2022-12-06 | Discharge: 2022-12-11 | DRG: 480 | Disposition: A | Discharge status: Skilled Nursing Facility | Payer: Medicare Other | Attending: Internal Medicine | Admitting: Internal Medicine

## 2022-12-06 ENCOUNTER — Other Ambulatory Visit: Payer: Self-pay

## 2022-12-06 ENCOUNTER — Inpatient Hospital Stay (HOSPITAL_COMMUNITY): Payer: Medicare Other

## 2022-12-06 ENCOUNTER — Emergency Department (HOSPITAL_COMMUNITY): Payer: Medicare Other

## 2022-12-06 ENCOUNTER — Encounter (HOSPITAL_COMMUNITY): Payer: Self-pay | Admitting: Internal Medicine

## 2022-12-06 DIAGNOSIS — S72451A Displaced supracondylar fracture without intracondylar extension of lower end of right femur, initial encounter for closed fracture: Principal | ICD-10-CM | POA: Diagnosis present

## 2022-12-06 DIAGNOSIS — I739 Peripheral vascular disease, unspecified: Secondary | ICD-10-CM | POA: Diagnosis not present

## 2022-12-06 DIAGNOSIS — Y9301 Activity, walking, marching and hiking: Secondary | ICD-10-CM | POA: Diagnosis present

## 2022-12-06 DIAGNOSIS — M9701XA Periprosthetic fracture around internal prosthetic right hip joint, initial encounter: Secondary | ICD-10-CM | POA: Diagnosis not present

## 2022-12-06 DIAGNOSIS — J449 Chronic obstructive pulmonary disease, unspecified: Secondary | ICD-10-CM | POA: Diagnosis present

## 2022-12-06 DIAGNOSIS — W19XXXA Unspecified fall, initial encounter: Secondary | ICD-10-CM | POA: Diagnosis present

## 2022-12-06 DIAGNOSIS — S72001A Fracture of unspecified part of neck of right femur, initial encounter for closed fracture: Principal | ICD-10-CM

## 2022-12-06 DIAGNOSIS — Z79899 Other long term (current) drug therapy: Secondary | ICD-10-CM

## 2022-12-06 DIAGNOSIS — S728X1A Other fracture of right femur, initial encounter for closed fracture: Secondary | ICD-10-CM

## 2022-12-06 DIAGNOSIS — M7989 Other specified soft tissue disorders: Secondary | ICD-10-CM | POA: Diagnosis not present

## 2022-12-06 DIAGNOSIS — S82841A Displaced bimalleolar fracture of right lower leg, initial encounter for closed fracture: Secondary | ICD-10-CM | POA: Diagnosis not present

## 2022-12-06 DIAGNOSIS — R296 Repeated falls: Secondary | ICD-10-CM | POA: Diagnosis not present

## 2022-12-06 DIAGNOSIS — I499 Cardiac arrhythmia, unspecified: Secondary | ICD-10-CM | POA: Diagnosis not present

## 2022-12-06 DIAGNOSIS — I10 Essential (primary) hypertension: Secondary | ICD-10-CM | POA: Diagnosis not present

## 2022-12-06 DIAGNOSIS — Z7901 Long term (current) use of anticoagulants: Secondary | ICD-10-CM

## 2022-12-06 DIAGNOSIS — M47812 Spondylosis without myelopathy or radiculopathy, cervical region: Secondary | ICD-10-CM | POA: Diagnosis not present

## 2022-12-06 DIAGNOSIS — Z8249 Family history of ischemic heart disease and other diseases of the circulatory system: Secondary | ICD-10-CM

## 2022-12-06 DIAGNOSIS — S8251XA Displaced fracture of medial malleolus of right tibia, initial encounter for closed fracture: Secondary | ICD-10-CM | POA: Diagnosis not present

## 2022-12-06 DIAGNOSIS — R29898 Other symptoms and signs involving the musculoskeletal system: Secondary | ICD-10-CM | POA: Diagnosis not present

## 2022-12-06 DIAGNOSIS — I495 Sick sinus syndrome: Secondary | ICD-10-CM | POA: Diagnosis present

## 2022-12-06 DIAGNOSIS — I700 Atherosclerosis of aorta: Secondary | ICD-10-CM | POA: Diagnosis not present

## 2022-12-06 DIAGNOSIS — S8251XD Displaced fracture of medial malleolus of right tibia, subsequent encounter for closed fracture with routine healing: Secondary | ICD-10-CM | POA: Diagnosis not present

## 2022-12-06 DIAGNOSIS — E860 Dehydration: Secondary | ICD-10-CM | POA: Diagnosis present

## 2022-12-06 DIAGNOSIS — R2689 Other abnormalities of gait and mobility: Secondary | ICD-10-CM | POA: Diagnosis not present

## 2022-12-06 DIAGNOSIS — W010XXA Fall on same level from slipping, tripping and stumbling without subsequent striking against object, initial encounter: Secondary | ICD-10-CM | POA: Diagnosis present

## 2022-12-06 DIAGNOSIS — Z743 Need for continuous supervision: Secondary | ICD-10-CM | POA: Diagnosis not present

## 2022-12-06 DIAGNOSIS — I4891 Unspecified atrial fibrillation: Secondary | ICD-10-CM | POA: Diagnosis not present

## 2022-12-06 DIAGNOSIS — Z9889 Other specified postprocedural states: Secondary | ICD-10-CM | POA: Diagnosis not present

## 2022-12-06 DIAGNOSIS — M25551 Pain in right hip: Secondary | ICD-10-CM | POA: Diagnosis present

## 2022-12-06 DIAGNOSIS — S82841D Displaced bimalleolar fracture of right lower leg, subsequent encounter for closed fracture with routine healing: Secondary | ICD-10-CM | POA: Diagnosis not present

## 2022-12-06 DIAGNOSIS — I480 Paroxysmal atrial fibrillation: Secondary | ICD-10-CM | POA: Diagnosis present

## 2022-12-06 DIAGNOSIS — D62 Acute posthemorrhagic anemia: Secondary | ICD-10-CM | POA: Diagnosis not present

## 2022-12-06 DIAGNOSIS — Z66 Do not resuscitate: Secondary | ICD-10-CM | POA: Diagnosis not present

## 2022-12-06 DIAGNOSIS — M1990 Unspecified osteoarthritis, unspecified site: Secondary | ICD-10-CM | POA: Diagnosis not present

## 2022-12-06 DIAGNOSIS — S93431A Sprain of tibiofibular ligament of right ankle, initial encounter: Secondary | ICD-10-CM | POA: Diagnosis not present

## 2022-12-06 DIAGNOSIS — S299XXA Unspecified injury of thorax, initial encounter: Secondary | ICD-10-CM | POA: Diagnosis not present

## 2022-12-06 DIAGNOSIS — D649 Anemia, unspecified: Secondary | ICD-10-CM | POA: Diagnosis not present

## 2022-12-06 DIAGNOSIS — R509 Fever, unspecified: Secondary | ICD-10-CM | POA: Diagnosis not present

## 2022-12-06 DIAGNOSIS — I119 Hypertensive heart disease without heart failure: Secondary | ICD-10-CM | POA: Diagnosis not present

## 2022-12-06 DIAGNOSIS — R1312 Dysphagia, oropharyngeal phase: Secondary | ICD-10-CM | POA: Diagnosis not present

## 2022-12-06 DIAGNOSIS — U071 COVID-19: Secondary | ICD-10-CM | POA: Diagnosis not present

## 2022-12-06 DIAGNOSIS — S82831D Other fracture of upper and lower end of right fibula, subsequent encounter for closed fracture with routine healing: Secondary | ICD-10-CM | POA: Diagnosis not present

## 2022-12-06 DIAGNOSIS — H9190 Unspecified hearing loss, unspecified ear: Secondary | ICD-10-CM | POA: Diagnosis present

## 2022-12-06 DIAGNOSIS — S72401D Unspecified fracture of lower end of right femur, subsequent encounter for closed fracture with routine healing: Secondary | ICD-10-CM | POA: Diagnosis not present

## 2022-12-06 DIAGNOSIS — R6889 Other general symptoms and signs: Secondary | ICD-10-CM | POA: Diagnosis not present

## 2022-12-06 DIAGNOSIS — I6381 Other cerebral infarction due to occlusion or stenosis of small artery: Secondary | ICD-10-CM | POA: Diagnosis not present

## 2022-12-06 DIAGNOSIS — M6281 Muscle weakness (generalized): Secondary | ICD-10-CM | POA: Diagnosis not present

## 2022-12-06 DIAGNOSIS — Z043 Encounter for examination and observation following other accident: Secondary | ICD-10-CM | POA: Diagnosis not present

## 2022-12-06 DIAGNOSIS — I517 Cardiomegaly: Secondary | ICD-10-CM | POA: Diagnosis not present

## 2022-12-06 DIAGNOSIS — R231 Pallor: Secondary | ICD-10-CM | POA: Diagnosis not present

## 2022-12-06 DIAGNOSIS — R7989 Other specified abnormal findings of blood chemistry: Secondary | ICD-10-CM | POA: Diagnosis not present

## 2022-12-06 DIAGNOSIS — S82831A Other fracture of upper and lower end of right fibula, initial encounter for closed fracture: Secondary | ICD-10-CM | POA: Diagnosis not present

## 2022-12-06 DIAGNOSIS — M9711XA Periprosthetic fracture around internal prosthetic right knee joint, initial encounter: Secondary | ICD-10-CM | POA: Diagnosis not present

## 2022-12-06 DIAGNOSIS — S72401A Unspecified fracture of lower end of right femur, initial encounter for closed fracture: Secondary | ICD-10-CM | POA: Diagnosis not present

## 2022-12-06 DIAGNOSIS — R279 Unspecified lack of coordination: Secondary | ICD-10-CM | POA: Diagnosis not present

## 2022-12-06 DIAGNOSIS — R918 Other nonspecific abnormal finding of lung field: Secondary | ICD-10-CM | POA: Diagnosis not present

## 2022-12-06 DIAGNOSIS — M25461 Effusion, right knee: Secondary | ICD-10-CM | POA: Diagnosis not present

## 2022-12-06 LAB — CK: Total CK: 72 U/L (ref 38–234)

## 2022-12-06 LAB — URINALYSIS, ROUTINE W REFLEX MICROSCOPIC
Bilirubin Urine: NEGATIVE
Glucose, UA: NEGATIVE mg/dL
Hgb urine dipstick: NEGATIVE
Ketones, ur: 5 mg/dL — AB
Leukocytes,Ua: NEGATIVE
Nitrite: NEGATIVE
Protein, ur: NEGATIVE mg/dL
Specific Gravity, Urine: 1.018 (ref 1.005–1.030)
pH: 5 (ref 5.0–8.0)

## 2022-12-06 LAB — HEPATIC FUNCTION PANEL
ALT: 21 U/L (ref 0–44)
AST: 34 U/L (ref 15–41)
Albumin: 2.5 g/dL — ABNORMAL LOW (ref 3.5–5.0)
Alkaline Phosphatase: 41 U/L (ref 38–126)
Bilirubin, Direct: 0.2 mg/dL (ref 0.0–0.2)
Indirect Bilirubin: 0 mg/dL — ABNORMAL LOW (ref 0.3–0.9)
Total Bilirubin: 0.2 mg/dL — ABNORMAL LOW (ref 0.3–1.2)
Total Protein: 5.2 g/dL — ABNORMAL LOW (ref 6.5–8.1)

## 2022-12-06 LAB — COMPREHENSIVE METABOLIC PANEL
ALT: 22 U/L (ref 0–44)
AST: 41 U/L (ref 15–41)
Albumin: 3.3 g/dL — ABNORMAL LOW (ref 3.5–5.0)
Alkaline Phosphatase: 56 U/L (ref 38–126)
Anion gap: 12 (ref 5–15)
BUN: 24 mg/dL — ABNORMAL HIGH (ref 8–23)
CO2: 24 mmol/L (ref 22–32)
Calcium: 8.5 mg/dL — ABNORMAL LOW (ref 8.9–10.3)
Chloride: 98 mmol/L (ref 98–111)
Creatinine, Ser: 0.69 mg/dL (ref 0.44–1.00)
GFR, Estimated: >60 mL/min (ref >60)
Glucose, Bld: 150 mg/dL — ABNORMAL HIGH (ref 70–99)
Potassium: 3.8 mmol/L (ref 3.5–5.1)
Sodium: 134 mmol/L — ABNORMAL LOW (ref 135–145)
Total Bilirubin: 0.8 mg/dL (ref 0.3–1.2)
Total Protein: 6.6 g/dL (ref 6.5–8.1)

## 2022-12-06 LAB — RESPIRATORY PANEL BY PCR

## 2022-12-06 LAB — PROTIME-INR
INR: 1.2 (ref 0.8–1.2)
Prothrombin Time: 15.1 seconds (ref 11.4–15.2)

## 2022-12-06 LAB — RESP PANEL BY RT-PCR (RSV, FLU A&B, COVID)  RVPGX2
Influenza A by PCR: NEGATIVE
Influenza B by PCR: NEGATIVE
Resp Syncytial Virus by PCR: NEGATIVE
SARS Coronavirus 2 by RT PCR: POSITIVE — AB

## 2022-12-06 LAB — FERRITIN: Ferritin: 75 ng/mL (ref 11–307)

## 2022-12-06 LAB — CBC
HCT: 38 % (ref 36.0–46.0)
Hemoglobin: 12.3 g/dL (ref 12.0–15.0)
MCH: 34.8 pg — ABNORMAL HIGH (ref 26.0–34.0)
MCHC: 32.4 g/dL (ref 30.0–36.0)
MCV: 107.6 fL — ABNORMAL HIGH (ref 80.0–100.0)
Platelets: 127 10*3/uL — ABNORMAL LOW (ref 150–400)
RBC: 3.53 MIL/uL — ABNORMAL LOW (ref 3.87–5.11)
RDW: 13.3 % (ref 11.5–15.5)
WBC: 7.3 10*3/uL (ref 4.0–10.5)
nRBC: 0 % (ref 0.0–0.2)

## 2022-12-06 LAB — I-STAT CHEM 8, ED
BUN: 29 mg/dL — ABNORMAL HIGH (ref 8–23)
Calcium, Ion: 1.05 mmol/L — ABNORMAL LOW (ref 1.15–1.40)
Chloride: 99 mmol/L (ref 98–111)
Creatinine, Ser: 0.6 mg/dL (ref 0.44–1.00)
Glucose, Bld: 140 mg/dL — ABNORMAL HIGH (ref 70–99)
HCT: 39 % (ref 36.0–46.0)
Hemoglobin: 13.3 g/dL (ref 12.0–15.0)
Potassium: 3.8 mmol/L (ref 3.5–5.1)
Sodium: 135 mmol/L (ref 135–145)
TCO2: 27 mmol/L (ref 22–32)

## 2022-12-06 LAB — LACTIC ACID, PLASMA
Lactic Acid, Venous: 2.3 mmol/L (ref 0.5–1.9)
Lactic Acid, Venous: 1.6 mmol/L (ref 0.5–1.9)
Lactic Acid, Venous: 6.4 mmol/L (ref 0.5–1.9)
Lactic Acid, Venous: 1.4 mmol/L (ref 0.5–1.9)

## 2022-12-06 LAB — SAMPLE TO BLOOD BANK

## 2022-12-06 LAB — ETHANOL: Alcohol, Ethyl (B): <10 mg/dL (ref <10)

## 2022-12-06 MED ORDER — SENNOSIDES-DOCUSATE SODIUM 8.6-50 MG PO TABS: 1.0000 | ORAL_TABLET | Freq: Every evening | ORAL | Status: DC | PRN

## 2022-12-06 MED ORDER — HYDRALAZINE HCL 25 MG PO TABS
25.0000 mg | ORAL_TABLET | Freq: Three times a day (TID) | ORAL | Status: DC | PRN
  Filled 2022-12-06: qty 1

## 2022-12-06 MED ORDER — ONDANSETRON HCL 4 MG PO TABS: 4.0000 mg | ORAL_TABLET | Freq: Four times a day (QID) | ORAL | Status: DC | PRN

## 2022-12-06 MED ORDER — TRAMADOL HCL 50 MG PO TABS
50.0000 mg | ORAL_TABLET | Freq: Three times a day (TID) | ORAL | Status: DC | PRN
  Filled 2022-12-06: qty 1

## 2022-12-06 MED ORDER — SODIUM CHLORIDE 0.9 % IV BOLUS (SEPSIS)
1000.0000 mL | Freq: Once | INTRAVENOUS | Status: AC
  Administered 2022-12-06: 1000 mL via INTRAVENOUS

## 2022-12-06 MED ORDER — MORPHINE SULFATE (PF) 2 MG/ML IV SOLN: 1.0000 mg | INTRAVENOUS | Status: DC | PRN

## 2022-12-06 MED ORDER — LACTATED RINGERS IV SOLN: INTRAVENOUS | Status: AC

## 2022-12-06 MED ORDER — ONDANSETRON HCL 4 MG/2ML IJ SOLN: 4.0000 mg | Freq: Four times a day (QID) | INTRAMUSCULAR | Status: DC | PRN

## 2022-12-06 MED ORDER — ACETAMINOPHEN 325 MG PO TABS
650.0000 mg | ORAL_TABLET | ORAL | Status: DC | PRN
  Administered 2022-12-06 – 2022-12-10 (×4): 650 mg via ORAL
  Filled 2022-12-06 (×4): qty 2

## 2022-12-06 MED ORDER — ENOXAPARIN SODIUM 40 MG/0.4ML IJ SOSY
40.0000 mg | PREFILLED_SYRINGE | INTRAMUSCULAR | Status: DC
  Administered 2022-12-06: 40 mg via SUBCUTANEOUS
  Filled 2022-12-06: qty 0.4

## 2022-12-06 NOTE — Consult Note (Addendum)
Reason for Consult:Right distal femur/ankle fxs Referring Physician: Lajean Saver Time called: 2130 Time at bedside: [name] is an 89 y.o. female.  HPI: [name] was at home and going up some stairs when she tripped and fell. She had immediate pain in her right leg and could not get up. She was brought to the ED as a level 2 trauma activation 2/2 Eliquis use. Workup showed a right periprosthetic femur fx and right bimal fx and orthopedic surgery was consulted. She lives at home alone and ambulates with a RW.  Past Medical History:  Diagnosis Date   Arthritis    Atrial fibrillation (HCC)    COPD (chronic obstructive pulmonary disease) (HCC)    Hip pain, left    Hypertension    Sinoatrial node dysfunction (HCC)     Past Surgical History:  Procedure Laterality Date   BACK SURGERY     EYE SURGERY Bilateral    cataract removal   hip arthoplasty     total   KNEE ARTHROPLASTY     PPM GENERATOR CHANGEOUT N/A 08/14/2018   Procedure: PPM GENERATOR CHANGEOUT;  Surgeon: Evans Lance, MD;  Location: [facility];  Service: Cardiovascular;  Laterality: N/A;   uterine polyp removal      Family History  Problem Relation Age of Onset   Prostate cancer Father    Heart failure Father    Heart disease Father    Stroke Sister    Depression Maternal Grandmother     Social History:  reports that she has never smoked. She has never used smokeless tobacco. She reports that she does not drink alcohol and does not use drugs.  Allergies: No Known Allergies  Medications: I have reviewed the patient's current medications.  Results for orders placed or performed during the hospital encounter of 12/06/22 (from the past 48 hour(s))  Comprehensive metabolic panel     Status: Abnormal   Collection Time: 12/06/22  5:37 AM  Result Value Ref Range   Sodium 134 (L) 135 - 145 mmol/L   Potassium 3.8 3.5 - 5.1 mmol/L   Chloride 98 98 - 111 mmol/L   CO2 24 22 - 32 mmol/L   Glucose,  Bld 150 (H) 70 - 99 mg/dL    Comment: Glucose reference range applies only to samples taken after fasting for at least 8 hours.   BUN 24 (H) 8 - 23 mg/dL   Creatinine, Ser 0.69 0.44 - 1.00 mg/dL   Calcium 8.5 (L) 8.9 - 10.3 mg/dL   Total Protein 6.6 6.5 - 8.1 g/dL   Albumin 3.3 (L) 3.5 - 5.0 g/dL   AST 41 15 - 41 U/L   ALT 22 0 - 44 U/L   Alkaline Phosphatase 56 38 - 126 U/L   Total Bilirubin 0.8 0.3 - 1.2 mg/dL   GFR, Estimated >60 >60 mL/min    Comment: (NOTE) Calculated using the CKD-EPI Creatinine Equation (2021)    Anion gap 12 5 - 15    Comment: Performed at [facility], [address], [place] [address]  CBC     Status: Abnormal   Collection Time: 12/06/22  5:37 AM  Result Value Ref Range   WBC 7.3 4.0 - 10.5 K/uL   RBC 3.53 (L) 3.87 - 5.11 MIL/uL   Hemoglobin 12.3 12.0 - 15.0 g/dL   HCT 38.0 36.0 - 46.0 %   MCV 107.6 (H) 80.0 - 100.0 fL   MCH 34.8 (H)  26.0 - 34.0 pg   MCHC 32.4 30.0 - 36.0 g/dL   RDW 54.2 70.6 - 23.7 %   Platelets 127 (L) 150 - 400 K/uL    Comment: REPEATED TO VERIFY   nRBC 0.0 0.0 - 0.2 %    Comment: Performed at [facility], [address] [place], [place] [address]  Ethanol     Status: None   Collection Time: 12/06/22  5:37 AM  Result Value Ref Range   Alcohol, Ethyl (B) <10 <10 mg/dL    Comment: (NOTE) Lowest detectable limit for serum alcohol is 10 mg/dL.  For medical purposes only. Performed at [facility], [address] [place], [place] [address]   Lactic acid, plasma     Status: Abnormal   Collection Time: 12/06/22  5:37 AM  Result Value Ref Range   Lactic Acid, Venous 2.3 (HH) 0.5 - 1.9 mmol/L    Comment: CRITICAL RESULT CALLED TO, READ BACK BY AND VERIFIED WITH A.DECHAMBEAU,RN. [id number] 12/06/22. LPAIT Performed at [facility], [address], [place], [place] [address]   Protime-INR     Status: None   Collection Time: 12/06/22  5:37 AM  Result Value Ref Range   Prothrombin Time 15.1 11.4 - 15.2  seconds   INR 1.2 0.8 - 1.2    Comment: (NOTE) INR goal varies based on device and disease states. Performed at [facility], [address] [place], [place] [address]   Sample to Blood Bank     Status: None   Collection Time: 12/06/22  5:37 AM  Result Value Ref Range   Blood Bank Specimen SAMPLE AVAILABLE FOR TESTING    Sample Expiration      12/07/2022,2359 Performed at [facility], [address] [place], [place] [address]   CK     Status: None   Collection Time: 12/06/22  5:37 AM  Result Value Ref Range   Total CK 72 38 - 234 U/L    Comment: Performed at [facility], [address] [place], [place] [address]  I-Stat Chem 8, ED     Status: Abnormal   Collection Time: 12/06/22  6:59 AM  Result Value Ref Range   Sodium 135 135 - 145 mmol/L   Potassium 3.8 3.5 - 5.1 mmol/L   Chloride 99 98 - 111 mmol/L   BUN 29 (H) 8 - 23 mg/dL   Creatinine, Ser 3.81 0.44 - 1.00 mg/dL   Glucose, Bld 829 (H) 70 - 99 mg/dL    Comment: Glucose reference range applies only to samples taken after fasting for at least 8 hours.   Calcium, Ion 1.05 (L) 1.15 - 1.40 mmol/L   TCO2 27 22 - 32 mmol/L   Hemoglobin 13.3 12.0 - 15.0 g/dL   HCT 93.7 16.9 - 67.8 %    CT KNEE RIGHT WO CONTRAST  Result Date: 12/06/2022 CLINICAL DATA:  Knee trauma, occult fracture suspected. Found down. Possible deformity. EXAM: CT OF THE RIGHT KNEE WITHOUT CONTRAST TECHNIQUE: Multidetector CT imaging of the right knee was performed according to the standard protocol. Multiplanar CT image reconstructions were also generated. RADIATION DOSE REDUCTION: This exam was performed according to the departmental dose-optimization program which includes automated exposure control, adjustment of the mA and/or kV according to patient size and/or use of iterative reconstruction technique. COMPARISON:  Radiographs 12/06/2022. FINDINGS: Bones/Joint/Cartilage Status post right total knee arthroplasty with resulting beam  hardening artifact. The bones are diffusely demineralized.  There is a periprosthetic fracture of the distal femoral metaphysis with up to 1.3 cm of lateral displacement, best seen on coronal image 70/7. Along the medial metaphysis, there is only minimal displacement. This fracture appears mildly impacted. The proximal tibia and proximal fibula appear intact. No hardware loosening identified. There is a moderate sized lipohemarthrosis. Ligaments Suboptimally assessed by CT. Muscles and Tendons Mild generalized muscular atrophy about the knee, especially within the medial head of the gastrocnemius muscle. The extensor mechanism appears intact. Soft tissues Moderate soft tissue swelling about the knee, greatest anteriorly and medially. Prominent vascular calcifications are noted. No soft tissue emphysema or unexpected foreign body. IMPRESSION: 1. Periprosthetic fracture of the distal femoral metaphysis with up to 1.3 cm of lateral displacement and mild impaction. 2. No evidence of proximal tibial or fibular fracture. 3. Moderate-sized lipohemarthrosis. 4. Moderate soft tissue swelling about the knee. Electronically Signed   By: Carey Bullocks M.D.   On: 12/06/2022 09:53   DG Knee Right Port  Result Date: 12/06/2022 CLINICAL DATA:  89 year old female status post fall. EXAM: PORTABLE RIGHT KNEE - 1-2 VIEW COMPARISON:  Right femur series today. Previous right knee series 05/25/2014. FINDINGS: Continued malalignment at the distal right femoral condyle with the femoral total knee are arthroplasty component. However, the femoral and tibial arthroplasty components remain aligned. On the lateral view there is evidence of impacted fracture fragments. And I suspect this is a distal femur impaction fracture with about 1/2 shaft width lateral displacement. Underlying advanced osteopenia. Proximal tibia and fibula appear intact. Patella appears intact. There is evidence of a joint effusion, although no obvious lipohemarthrosis  on the cross-table lateral. Advanced calcified peripheral vascular disease. IMPRESSION: 1. Osteopenia with constellation most compatible with an impacted distal right femoral condyle fracture about the femoral component of the total knee arthroplasty. 1/2 shaft width lateral displacement. 2. Femoral and tibial arthroplasty components appear to remain aligned. Evidence of joint effusion. Electronically Signed   By: Odessa Fleming M.D.   On: 12/06/2022 06:51   DG Ankle Right Port  Result Date: 12/06/2022 CLINICAL DATA:  89 year old female status post fall with osteopenia and distal fibula fracture on tib fib series. EXAM: PORTABLE RIGHT ANKLE - 2 VIEW COMPARISON:  Tib fib series today reported separately. FINDINGS: Advanced calcified peripheral vascular disease. Oblique distal right fibula fracture is fairly centered at the metadiaphysis and tracks to the level of the tibial plafond. Superimposed minimally displaced medial malleolus fracture. Posterior malleolus seems to remain intact along with the talar dome and calcaneus. Calcaneus degenerative spurring. Relatively maintained mortise joint alignment. Regional soft tissue swelling. Grossly intact visible other bones of the right foot. IMPRESSION: 1. Osteopenia with minimally displaced bimalleolar type fracture 2. Regional soft tissue swelling. Advanced calcified peripheral vascular disease. Electronically Signed   By: Odessa Fleming M.D.   On: 12/06/2022 06:49   DG Pelvis Portable  Result Date: 12/06/2022 CLINICAL DATA:  89 year old female status post fall on blood thinners. EXAM: PORTABLE PELVIS 1-2 VIEWS COMPARISON:  Right femur series today.  Right hip series 05/25/2014. FINDINGS: Portable AP supine view at 0540 hours. Osteopenia. Partially visible right hip arthroplasty. On these images it is difficult to exclude an acute impacted fracture of the right femur medial intertrochanteric segment. No superimposed pelvis fracture identified. Left femoral head remains  normally located. Chronic lower lumbar fusion hardware. Calcified iliofemoral atherosclerosis. Negative visible bowel gas pattern. IMPRESSION: 1. On this image there is strong suspicion of impacted Acute Fracture At The Proximal Right Femur Intertrochanteric  segment, superimposed on chronic right hip arthroplasty. This was not apparent on the right femur series today. 2. Osteopenia.  No superimposed pelvis fracture identified. Electronically Signed   By: Odessa Fleming M.D.   On: 12/06/2022 06:20   DG FEMUR PORT, 1V RIGHT  Result Date: 12/06/2022 CLINICAL DATA:  89 year old female status post fall on blood thinners. EXAM: RIGHT FEMUR PORTABLE 1 VIEW COMPARISON:  Right hip series 05/25/2014. Right knee series 06/09/2014. FINDINGS: Chronic right hip arthroplasty. Underlying osteopenia. Right hip hardware appears stable and intact on these two views. Visible right hemipelvis grossly intact. Healed fracture of the proximal right femur with some callus formation at the intertrochanteric segment. Right femoral shaft appears intact. However, there appears to be malalignment of the distal right femur knee arthroplasty hardware. But the alignment with the tibial plateau hardware seems maintained. Extensive right femoral and lower extremity calcified peripheral vascular disease. IMPRESSION: 1. Recommend dedicated Right Knee Series for suspected distal femur fracture, malalignment at the femoral component of the right total knee arthroplasty. 2. Osteopenia. No definite additional right femur fracture. Grossly stable chronic right hip arthroplasty. 3. Advanced calcified peripheral vascular disease. Electronically Signed   By: Odessa Fleming M.D.   On: 12/06/2022 06:18   DG Tibia/Fibula Right Port  Result Date: 12/06/2022 CLINICAL DATA:  89 year old female status post fall on blood thinners. EXAM: PORTABLE RIGHT TIBIA AND FIBULA - 2 VIEW COMPARISON:  Right knee series 06/09/2014. FINDINGS: Calcified peripheral vascular disease.  Osteopenia. Partially visible chronic right total knee arthroplasty. Oblique minimally displaced fracture of the distal right fibula shaft near the junction with the metadiaphysis. This is only apparent on image #1. Distal tibia and mortise joint alignment grossly maintained. Tib fib midshaft and proximal tibia and fibula appear to remain intact. Widespread soft tissue swelling, stranding. No soft tissue gas identified. Partially visible extensive foot soft tissue swelling. Calcaneus appears intact. IMPRESSION: 1. Osteopenia with subtle oblique fracture of the distal right fibula shaft, beginning about 5 cm proximal to the ankle joint. Dedicated right ankle series would be valuable. 2. No other right tib fib fracture identified. Partially visible chronic knee arthroplasty. 3. Severe calcified peripheral vascular disease. Electronically Signed   By: Odessa Fleming M.D.   On: 12/06/2022 06:15   DG Chest Port 1 View  Result Date: 12/06/2022 CLINICAL DATA:  89 year old female with history of trauma from a fall. On blood thinners. EXAM: PORTABLE CHEST 1 VIEW COMPARISON:  Chest x-ray 07/02/2019. FINDINGS: Lung volumes are normal. No consolidative airspace disease. Diffuse interstitial prominence and widespread peribronchial cuffing, unchanged. No pleural effusions. No pneumothorax. No evidence of pulmonary edema. Heart size is mildly enlarged. Upper mediastinal contours are within normal limits allowing for patient positioning. Atherosclerotic calcifications in the thoracic aorta. Left-sided pacemaker device in place with lead tips projecting over the expected location of the right atrium and right ventricle. IMPRESSION: 1. No definite radiographic evidence of acute cardiopulmonary disease. The appearance of the lungs suggests chronic bronchitis, similar to prior examination. 2. Cardiomegaly. 3. Aortic atherosclerosis. Electronically Signed   By: Trudie Reed M.D.   On: 12/06/2022 06:13   CT HEAD WO CONTRAST  Result  Date: 12/06/2022 CLINICAL DATA:  89 year old female with history of trauma after a fall. EXAM: CT HEAD WITHOUT CONTRAST CT CERVICAL SPINE WITHOUT CONTRAST TECHNIQUE: Multidetector CT imaging of the head and cervical spine was performed following the standard protocol without intravenous contrast. Multiplanar CT image reconstructions of the cervical spine were also generated. RADIATION DOSE REDUCTION: This exam  was performed according to the departmental dose-optimization program which includes automated exposure control, adjustment of the mA and/or kV according to patient size and/or use of iterative reconstruction technique. COMPARISON:  Head CT 11/12/2016.  No prior cervical spine CT. FINDINGS: CT HEAD FINDINGS Brain: Moderate cerebral and mild cerebellar atrophy. Patchy and confluent areas of decreased attenuation are noted throughout the deep and periventricular white matter of the cerebral hemispheres bilaterally, compatible with chronic microvascular ischemic disease. Well-defined area of low attenuation in the right parietal region, similar to the prior study, compatible with encephalomalacia from an old right MCA/PCA territory watershed infarct. There is also a well-defined area of low attenuation in the periphery of the right cerebellar hemisphere, similar to the prior study, compatible with an old infarct. Multiple tiny well-defined foci of low attenuation are noted scattered throughout the basal ganglia bilaterally, compatible with old lacunar infarcts. No evidence of acute infarction, hemorrhage, hydrocephalus, extra-axial collection or mass lesion/mass effect. Vascular: No hyperdense vessel or unexpected calcification. Skull: Normal. Negative for fracture or focal lesion. Sinuses/Orbits: No acute finding. Other: None. CT CERVICAL SPINE FINDINGS Alignment: Reversal of normal cervical lordosis centered at the level of C4, likely positional and/or chronic and degenerative in nature. Associated with this  area is 3 mm of anterolisthesis of C3 upon C4 and 2 mm of anterolisthesis of C4 upon C5. Skull base and vertebrae: No acute fracture. No primary bone lesion or focal pathologic process. Soft tissues and spinal canal: No prevertebral fluid or swelling. No visible canal hematoma. Disc levels: Severe multilevel degenerative disc disease, most pronounced at C4-C5, C5-C6 and C6-C7. Severe multilevel facet arthropathy bilaterally. Upper chest: Unremarkable. Other: None. IMPRESSION: 1. No evidence of significant acute traumatic injury to the skull, brain or cervical spine. 2. Moderate cerebral and mild cerebellar atrophy with extensive chronic microvascular ischemic changes in the cerebral white matter, multiple old lacunar infarcts throughout the basal ganglia bilaterally, old right cerebellar infarcts, and old right MCA/PCA territory infarct, as detailed above. 3. Severe multilevel degenerative disc disease and cervical spondylosis. Electronically Signed   By: Trudie Reed M.D.   On: 12/06/2022 06:10   CT CERVICAL SPINE WO CONTRAST  Result Date: 12/06/2022 CLINICAL DATA:  89 year old female with history of trauma after a fall. EXAM: CT HEAD WITHOUT CONTRAST CT CERVICAL SPINE WITHOUT CONTRAST TECHNIQUE: Multidetector CT imaging of the head and cervical spine was performed following the standard protocol without intravenous contrast. Multiplanar CT image reconstructions of the cervical spine were also generated. RADIATION DOSE REDUCTION: This exam was performed according to the departmental dose-optimization program which includes automated exposure control, adjustment of the mA and/or kV according to patient size and/or use of iterative reconstruction technique. COMPARISON:  Head CT 11/12/2016.  No prior cervical spine CT. FINDINGS: CT HEAD FINDINGS Brain: Moderate cerebral and mild cerebellar atrophy. Patchy and confluent areas of decreased attenuation are noted throughout the deep and periventricular white  matter of the cerebral hemispheres bilaterally, compatible with chronic microvascular ischemic disease. Well-defined area of low attenuation in the right parietal region, similar to the prior study, compatible with encephalomalacia from an old right MCA/PCA territory watershed infarct. There is also a well-defined area of low attenuation in the periphery of the right cerebellar hemisphere, similar to the prior study, compatible with an old infarct. Multiple tiny well-defined foci of low attenuation are noted scattered throughout the basal ganglia bilaterally, compatible with old lacunar infarcts. No evidence of acute infarction, hemorrhage, hydrocephalus, extra-axial collection or mass lesion/mass effect. Vascular: No hyperdense  vessel or unexpected calcification. Skull: Normal. Negative for fracture or focal lesion. Sinuses/Orbits: No acute finding. Other: None. CT CERVICAL SPINE FINDINGS Alignment: Reversal of normal cervical lordosis centered at the level of C4, likely positional and/or chronic and degenerative in nature. Associated with this area is 3 mm of anterolisthesis of C3 upon C4 and 2 mm of anterolisthesis of C4 upon C5. Skull base and vertebrae: No acute fracture. No primary bone lesion or focal pathologic process. Soft tissues and spinal canal: No prevertebral fluid or swelling. No visible canal hematoma. Disc levels: Severe multilevel degenerative disc disease, most pronounced at C4-C5, C5-C6 and C6-C7. Severe multilevel facet arthropathy bilaterally. Upper chest: Unremarkable. Other: None. IMPRESSION: 1. No evidence of significant acute traumatic injury to the skull, brain or cervical spine. 2. Moderate cerebral and mild cerebellar atrophy with extensive chronic microvascular ischemic changes in the cerebral white matter, multiple old lacunar infarcts throughout the basal ganglia bilaterally, old right cerebellar infarcts, and old right MCA/PCA territory infarct, as detailed above. 3. Severe  multilevel degenerative disc disease and cervical spondylosis. Electronically Signed   By: Vinnie Langton M.D.   On: 12/06/2022 06:10    Review of Systems  HENT:  Negative for ear discharge, ear pain, hearing loss and tinnitus.   Eyes:  Negative for photophobia and pain.  Respiratory:  Negative for cough and shortness of breath.   Cardiovascular:  Negative for chest pain.  Gastrointestinal:  Negative for abdominal pain, nausea and vomiting.  Genitourinary:  Negative for dysuria, flank pain, frequency and urgency.  Musculoskeletal:  Positive for arthralgias (Right knee/ankle). Negative for back pain, myalgias and neck pain.  Neurological:  Negative for dizziness and headaches.  Hematological:  Does not bruise/bleed easily.  Psychiatric/Behavioral:  The patient is not nervous/anxious.    Blood pressure (!) 161/135, pulse 73, temperature 98.2 F (36.8 C), temperature source Oral, resp. rate 20, height 5\' 5"  (1.651 m), weight 60.8 kg, SpO2 94 %. Physical Exam Constitutional:      General: She is not in acute distress.    Appearance: She is well-developed. She is not diaphoretic.  HENT:     Head: Normocephalic and atraumatic.  Eyes:     General: No scleral icterus.       Right eye: No discharge.        Left eye: No discharge.     Conjunctiva/sclera: Conjunctivae normal.  Cardiovascular:     Rate and Rhythm: Normal rate and regular rhythm.  Pulmonary:     Effort: Pulmonary effort is normal. No respiratory distress.  Musculoskeletal:     Cervical back: Normal range of motion.     Comments: RLE No traumatic wounds, ecchymosis, or rash  Mild TTP knee, short leg splint in place  No knee effusion, compartments soft  Sens DPN, SPN, TN intact  Motor EHL 5/5  Toes perfused, No significant edema  Skin:    General: Skin is warm and dry.  Neurological:     Mental Status: She is alert.  Psychiatric:        Mood and Affect: Mood normal.        Behavior: Behavior normal.      Assessment/Plan: Right periprosthetic distal femur fx -- Plan ORIF tomorrow with Dr. Doreatha Martin. Please keep NPO after MN. Right ankle fx -- As above Multiple medical problems including afib on Eliquis, COPD, and HTN -- per primary service. Please hold Eliquis.    [name] Abu, PA-C Orthopedic Surgery [phone] 12/06/2022, 10:03 AM    Patient  seen and examined and agree with the note above.  Patient with a right periprosthetic distal femur fracture and a right bimalleolar ankle fracture.  Due to the unstable nature of her injuries I recommend proceeding with open reduction internal fixation.  Risk benefits were discussed with the patient and her son over the phone.  Risks include but not limited to bleeding, infection, malunion, nonunion, hardware failure, hardware irritation, nerve or blood vessel injury, DVT, even the possibility anesthetic complications.  She agrees to proceed with surgery and consent was obtained.  Roby Lofts, MD [facility] [phone] (office) [email or web address]

## 2022-12-06 NOTE — ED Provider Notes (Signed)
[name] Provider Note   CSN: [id number] Arrival date & time: 12/06/22  8676     History  Chief complaint-fall Level 5 caveat due to acuity of condition [name] is a 89 y.o. female.  HPI Patient presents as a level 2 trauma after fall on anticoagulation Patient with history of A-fib, tachybradycardia syndrome with pacemaker, on Eliquis, hypertension, COPD presents after fall Patient used life alert after lying on the floor for several hours and was found with deformity to her right leg. Patient is hard of hearing which is made history difficult. No other details known on arrival It is unclear what triggered her fall.   Past Medical History:  Diagnosis Date   Arthritis    Atrial fibrillation (HCC)    COPD (chronic obstructive pulmonary disease) (HCC)    Hip pain, left    Hypertension    Sinoatrial node dysfunction (HCC)     Home Medications Prior to Admission medications   Medication Sig Start Date End Date Taking? Authorizing Provider  apixaban (ELIQUIS) 2.5 MG TABS tablet Take 1 tablet (2.5 mg total) by mouth 2 (two) times daily. Dose decrease per MD. 07/31/22   Antoine Poche, MD  furosemide (LASIX) 40 MG tablet Take 1 tablet (40 mg total) by mouth daily as needed. 07/06/21   Antoine Poche, MD  Iron, Ferrous Sulfate, 325 (65 Fe) MG TABS Take 325 mg by mouth daily. 07/12/22   Donita Brooks, MD  lisinopril (ZESTRIL) 40 MG tablet TAKE 1 TABLET BY MOUTH EVERY DAY 06/27/22   Antoine Poche, MD  metoprolol tartrate (LOPRESSOR) 25 MG tablet TAKE 1 TABLET BY MOUTH TWICE A DAY 08/06/22   Antoine Poche, MD  Multiple Vitamin (MULTIVITAMIN) tablet Take 1 tablet by mouth daily.    [provider]      Allergies    Patient has no known allergies.    Review of Systems   Review of Systems  Unable to perform ROS: Acuity of condition    Physical Exam Updated Vital Signs BP (!) 180/80   Pulse 71    Temp 98.1 F (36.7 C) (Oral)   Resp 17   Ht 1.651 m (5\' 5" )   Wt 60.8 kg   SpO2 100%   BMI 22.30 kg/m  Physical Exam CONSTITUTIONAL: Elderly and frail HEAD: Normocephalic/atraumatic EYES: EOMI/PERRL ENMT: Mucous membranes moist, poor dentition NECK: C-collar in place SPINE/BACK:entire spine nontender Patient maintained in spinal precautions/logroll utilized No bruising/crepitance/stepoffs noted to spine CV: No loud murmurs LUNGS: Lungs are clear to auscultation bilaterally, no apparent distress Chest-no bruising or crepitus ABDOMEN: soft, nontender, no bruising NEURO: Pt is awake/alert but appears confused.   EXTREMITIES: Right lower extremity was flexed at the knee and rotated under the left lower extremity. The extremity was then placed in an anatomical position. She has edema and tenderness to the right tib-fib.  No obvious deformity.  Distal pulses are equal and intact.  Right lower extremity is warm to touch All other extremities/joints palpated/ranged and nontender SKIN: warm, color normal  ED Results / Procedures / Treatments   Labs (all labs ordered are listed, but only abnormal results are displayed) Labs Reviewed  COMPREHENSIVE METABOLIC PANEL - Abnormal; Notable for the following components:      Result Value   Sodium 134 (*)    Glucose, Bld 150 (*)    BUN 24 (*)    Calcium 8.5 (*)    Albumin 3.3 (*)  All other components within normal limits  CBC - Abnormal; Notable for the following components:   RBC 3.53 (*)    MCV 107.6 (*)    MCH 34.8 (*)    Platelets 127 (*)    All other components within normal limits  LACTIC ACID, PLASMA - Abnormal; Notable for the following components:   Lactic Acid, Venous 2.3 (*)    All other components within normal limits  I-STAT CHEM 8, ED - Abnormal; Notable for the following components:   BUN 29 (*)    Glucose, Bld 140 (*)    Calcium, Ion 1.05 (*)    All other components within normal limits  ETHANOL  PROTIME-INR   CK  URINALYSIS, ROUTINE W REFLEX MICROSCOPIC  SAMPLE TO BLOOD BANK    EKG EKG Interpretation  Date/Time:  Thursday December 06 2022 05:36:39 EST Ventricular Rate:  82 PR Interval:    QRS Duration: 98 QT Interval:  410 QTC Calculation: 479 R Axis:   -33 Text Interpretation: Atrial fibrillation LVH with secondary repolarization abnormality Probable anterior infarct, age indeterminate Confirmed by Zadie Rhine ([id number]) on 12/06/2022 5:43:36 AM  Radiology DG Knee Right Port  Result Date: 12/06/2022 CLINICAL DATA:  89 year old female status post fall. EXAM: PORTABLE RIGHT KNEE - 1-2 VIEW COMPARISON:  Right femur series today. Previous right knee series 05/25/2014. FINDINGS: Continued malalignment at the distal right femoral condyle with the femoral total knee are arthroplasty component. However, the femoral and tibial arthroplasty components remain aligned. On the lateral view there is evidence of impacted fracture fragments. And I suspect this is a distal femur impaction fracture with about 1/2 shaft width lateral displacement. Underlying advanced osteopenia. Proximal tibia and fibula appear intact. Patella appears intact. There is evidence of a joint effusion, although no obvious lipohemarthrosis on the cross-table lateral. Advanced calcified peripheral vascular disease. IMPRESSION: 1. Osteopenia with constellation most compatible with an impacted distal right femoral condyle fracture about the femoral component of the total knee arthroplasty. 1/2 shaft width lateral displacement. 2. Femoral and tibial arthroplasty components appear to remain aligned. Evidence of joint effusion. Electronically Signed   By: Odessa Fleming M.D.   On: 12/06/2022 06:51   DG Ankle Right Port  Result Date: 12/06/2022 CLINICAL DATA:  89 year old female status post fall with osteopenia and distal fibula fracture on tib fib series. EXAM: PORTABLE RIGHT ANKLE - 2 VIEW COMPARISON:  Tib fib series today reported separately.  FINDINGS: Advanced calcified peripheral vascular disease. Oblique distal right fibula fracture is fairly centered at the metadiaphysis and tracks to the level of the tibial plafond. Superimposed minimally displaced medial malleolus fracture. Posterior malleolus seems to remain intact along with the talar dome and calcaneus. Calcaneus degenerative spurring. Relatively maintained mortise joint alignment. Regional soft tissue swelling. Grossly intact visible other bones of the right foot. IMPRESSION: 1. Osteopenia with minimally displaced bimalleolar type fracture 2. Regional soft tissue swelling. Advanced calcified peripheral vascular disease. Electronically Signed   By: Odessa Fleming M.D.   On: 12/06/2022 06:49   DG Pelvis Portable  Result Date: 12/06/2022 CLINICAL DATA:  89 year old female status post fall on blood thinners. EXAM: PORTABLE PELVIS 1-2 VIEWS COMPARISON:  Right femur series today.  Right hip series 05/25/2014. FINDINGS: Portable AP supine view at 0540 hours. Osteopenia. Partially visible right hip arthroplasty. On these images it is difficult to exclude an acute impacted fracture of the right femur medial intertrochanteric segment. No superimposed pelvis fracture identified. Left femoral head remains normally located. Chronic lower lumbar fusion  hardware. Calcified iliofemoral atherosclerosis. Negative visible bowel gas pattern. IMPRESSION: 1. On this image there is strong suspicion of impacted Acute Fracture At The Proximal Right Femur Intertrochanteric segment, superimposed on chronic right hip arthroplasty. This was not apparent on the right femur series today. 2. Osteopenia.  No superimposed pelvis fracture identified. Electronically Signed   By: Genevie Ann M.D.   On: 12/06/2022 06:20   DG FEMUR PORT, 1V RIGHT  Result Date: 12/06/2022 CLINICAL DATA:  89 year old female status post fall on blood thinners. EXAM: RIGHT FEMUR PORTABLE 1 VIEW COMPARISON:  Right hip series 05/25/2014. Right knee series  06/09/2014. FINDINGS: Chronic right hip arthroplasty. Underlying osteopenia. Right hip hardware appears stable and intact on these two views. Visible right hemipelvis grossly intact. Healed fracture of the proximal right femur with some callus formation at the intertrochanteric segment. Right femoral shaft appears intact. However, there appears to be malalignment of the distal right femur knee arthroplasty hardware. But the alignment with the tibial plateau hardware seems maintained. Extensive right femoral and lower extremity calcified peripheral vascular disease. IMPRESSION: 1. Recommend dedicated Right Knee Series for suspected distal femur fracture, malalignment at the femoral component of the right total knee arthroplasty. 2. Osteopenia. No definite additional right femur fracture. Grossly stable chronic right hip arthroplasty. 3. Advanced calcified peripheral vascular disease. Electronically Signed   By: Genevie Ann M.D.   On: 12/06/2022 06:18   DG Tibia/Fibula Right Port  Result Date: 12/06/2022 CLINICAL DATA:  89 year old female status post fall on blood thinners. EXAM: PORTABLE RIGHT TIBIA AND FIBULA - 2 VIEW COMPARISON:  Right knee series 06/09/2014. FINDINGS: Calcified peripheral vascular disease. Osteopenia. Partially visible chronic right total knee arthroplasty. Oblique minimally displaced fracture of the distal right fibula shaft near the junction with the metadiaphysis. This is only apparent on image #1. Distal tibia and mortise joint alignment grossly maintained. Tib fib midshaft and proximal tibia and fibula appear to remain intact. Widespread soft tissue swelling, stranding. No soft tissue gas identified. Partially visible extensive foot soft tissue swelling. Calcaneus appears intact. IMPRESSION: 1. Osteopenia with subtle oblique fracture of the distal right fibula shaft, beginning about 5 cm proximal to the ankle joint. Dedicated right ankle series would be valuable. 2. No other right tib fib  fracture identified. Partially visible chronic knee arthroplasty. 3. Severe calcified peripheral vascular disease. Electronically Signed   By: Genevie Ann M.D.   On: 12/06/2022 06:15   DG Chest Port 1 View  Result Date: 12/06/2022 CLINICAL DATA:  89 year old female with history of trauma from a fall. On blood thinners. EXAM: PORTABLE CHEST 1 VIEW COMPARISON:  Chest x-ray 07/02/2019. FINDINGS: Lung volumes are normal. No consolidative airspace disease. Diffuse interstitial prominence and widespread peribronchial cuffing, unchanged. No pleural effusions. No pneumothorax. No evidence of pulmonary edema. Heart size is mildly enlarged. Upper mediastinal contours are within normal limits allowing for patient positioning. Atherosclerotic calcifications in the thoracic aorta. Left-sided pacemaker device in place with lead tips projecting over the expected location of the right atrium and right ventricle. IMPRESSION: 1. No definite radiographic evidence of acute cardiopulmonary disease. The appearance of the lungs suggests chronic bronchitis, similar to prior examination. 2. Cardiomegaly. 3. Aortic atherosclerosis. Electronically Signed   By: Vinnie Langton M.D.   On: 12/06/2022 06:13   CT HEAD WO CONTRAST  Result Date: 12/06/2022 CLINICAL DATA:  89 year old female with history of trauma after a fall. EXAM: CT HEAD WITHOUT CONTRAST CT CERVICAL SPINE WITHOUT CONTRAST TECHNIQUE: Multidetector CT imaging of the head  and cervical spine was performed following the standard protocol without intravenous contrast. Multiplanar CT image reconstructions of the cervical spine were also generated. RADIATION DOSE REDUCTION: This exam was performed according to the departmental dose-optimization program which includes automated exposure control, adjustment of the mA and/or kV according to patient size and/or use of iterative reconstruction technique. COMPARISON:  Head CT 11/12/2016.  No prior cervical spine CT. FINDINGS: CT HEAD  FINDINGS Brain: Moderate cerebral and mild cerebellar atrophy. Patchy and confluent areas of decreased attenuation are noted throughout the deep and periventricular white matter of the cerebral hemispheres bilaterally, compatible with chronic microvascular ischemic disease. Well-defined area of low attenuation in the right parietal region, similar to the prior study, compatible with encephalomalacia from an old right MCA/PCA territory watershed infarct. There is also a well-defined area of low attenuation in the periphery of the right cerebellar hemisphere, similar to the prior study, compatible with an old infarct. Multiple tiny well-defined foci of low attenuation are noted scattered throughout the basal ganglia bilaterally, compatible with old lacunar infarcts. No evidence of acute infarction, hemorrhage, hydrocephalus, extra-axial collection or mass lesion/mass effect. Vascular: No hyperdense vessel or unexpected calcification. Skull: Normal. Negative for fracture or focal lesion. Sinuses/Orbits: No acute finding. Other: None. CT CERVICAL SPINE FINDINGS Alignment: Reversal of normal cervical lordosis centered at the level of C4, likely positional and/or chronic and degenerative in nature. Associated with this area is 3 mm of anterolisthesis of C3 upon C4 and 2 mm of anterolisthesis of C4 upon C5. Skull base and vertebrae: No acute fracture. No primary bone lesion or focal pathologic process. Soft tissues and spinal canal: No prevertebral fluid or swelling. No visible canal hematoma. Disc levels: Severe multilevel degenerative disc disease, most pronounced at C4-C5, C5-C6 and C6-C7. Severe multilevel facet arthropathy bilaterally. Upper chest: Unremarkable. Other: None. IMPRESSION: 1. No evidence of significant acute traumatic injury to the skull, brain or cervical spine. 2. Moderate cerebral and mild cerebellar atrophy with extensive chronic microvascular ischemic changes in the cerebral white matter, multiple  old lacunar infarcts throughout the basal ganglia bilaterally, old right cerebellar infarcts, and old right MCA/PCA territory infarct, as detailed above. 3. Severe multilevel degenerative disc disease and cervical spondylosis. Electronically Signed   By: Vinnie Langton M.D.   On: 12/06/2022 06:10   CT CERVICAL SPINE WO CONTRAST  Result Date: 12/06/2022 CLINICAL DATA:  89 year old female with history of trauma after a fall. EXAM: CT HEAD WITHOUT CONTRAST CT CERVICAL SPINE WITHOUT CONTRAST TECHNIQUE: Multidetector CT imaging of the head and cervical spine was performed following the standard protocol without intravenous contrast. Multiplanar CT image reconstructions of the cervical spine were also generated. RADIATION DOSE REDUCTION: This exam was performed according to the departmental dose-optimization program which includes automated exposure control, adjustment of the mA and/or kV according to patient size and/or use of iterative reconstruction technique. COMPARISON:  Head CT 11/12/2016.  No prior cervical spine CT. FINDINGS: CT HEAD FINDINGS Brain: Moderate cerebral and mild cerebellar atrophy. Patchy and confluent areas of decreased attenuation are noted throughout the deep and periventricular white matter of the cerebral hemispheres bilaterally, compatible with chronic microvascular ischemic disease. Well-defined area of low attenuation in the right parietal region, similar to the prior study, compatible with encephalomalacia from an old right MCA/PCA territory watershed infarct. There is also a well-defined area of low attenuation in the periphery of the right cerebellar hemisphere, similar to the prior study, compatible with an old infarct. Multiple tiny well-defined foci of low attenuation are  noted scattered throughout the basal ganglia bilaterally, compatible with old lacunar infarcts. No evidence of acute infarction, hemorrhage, hydrocephalus, extra-axial collection or mass lesion/mass effect.  Vascular: No hyperdense vessel or unexpected calcification. Skull: Normal. Negative for fracture or focal lesion. Sinuses/Orbits: No acute finding. Other: None. CT CERVICAL SPINE FINDINGS Alignment: Reversal of normal cervical lordosis centered at the level of C4, likely positional and/or chronic and degenerative in nature. Associated with this area is 3 mm of anterolisthesis of C3 upon C4 and 2 mm of anterolisthesis of C4 upon C5. Skull base and vertebrae: No acute fracture. No primary bone lesion or focal pathologic process. Soft tissues and spinal canal: No prevertebral fluid or swelling. No visible canal hematoma. Disc levels: Severe multilevel degenerative disc disease, most pronounced at C4-C5, C5-C6 and C6-C7. Severe multilevel facet arthropathy bilaterally. Upper chest: Unremarkable. Other: None. IMPRESSION: 1. No evidence of significant acute traumatic injury to the skull, brain or cervical spine. 2. Moderate cerebral and mild cerebellar atrophy with extensive chronic microvascular ischemic changes in the cerebral white matter, multiple old lacunar infarcts throughout the basal ganglia bilaterally, old right cerebellar infarcts, and old right MCA/PCA territory infarct, as detailed above. 3. Severe multilevel degenerative disc disease and cervical spondylosis. Electronically Signed   By: Trudie Reed M.D.   On: 12/06/2022 06:10    Procedures .Ortho Injury Treatment  Date/Time: 12/06/2022 7:13 AM  Performed by: Zadie Rhine, MD Authorized by: Zadie Rhine, MD   Consent:    Consent obtained:  Verbal   Consent given by:  PatientInjury location: ankle Location details: right ankle Injury type: fracture Pre-procedure neurovascular assessment: neurovascularly intact Pre-procedure distal perfusion: normal Pre-procedure neurological function: normal Pre-procedure range of motion: reduced Manipulation performed: no Immobilization: splint Splint type: short leg and ankle stirrup Splint  Applied by: Ortho Tech Supplies used: Ortho-Glass Post-procedure neurovascular assessment: post-procedure neurovascularly intact Post-procedure distal perfusion: normal Post-procedure neurological function: normal Post-procedure range of motion: unchanged       Medications Ordered in ED Medications  sodium chloride 0.9 % bolus 1,000 mL (1,000 mLs Intravenous New Bag/Given 12/06/22 1975)    ED Course/ Medical Decision Making/ A&P Clinical Course as of 12/06/22 0714  Thu Dec 06, 2022  0624 I spoke to her stepson [name] Crimi.  He reports she has had generalized weakness recently, but lives by herself but he checks on her twice a day.  He reports the last time he saw her was around 7 or 8 PM on January 24.  He reports that the life alert went off around 2 or 3 AM.  He does not think she was on the floor for 12 hours. [DW]  [phone] Patient presents as a trauma for fall of unclear etiology.  Patient appears to have a right hip fracture as well as right bimall fracture.  Patient also appears dehydrated.  She will need to be admitted.  Will consult orthopedics & consult [facility] [DW]    Clinical Course User Index [DW] Zadie Rhine, MD                             Medical Decision Making Amount and/or Complexity of Data Reviewed Labs: ordered. Radiology: ordered.  Risk Decision regarding hospitalization.   This patient presents to the ED for concern of fall/trauma, this involves an extensive number of treatment options, and is a complaint that carries with it a high risk of complications and morbidity.  The differential diagnosis includes but is  not limited to subdural hematoma, intracranial hemorrhage, skull fracture, cervical spine fracture, blunt chest trauma  Comorbidities that complicate the patient evaluation: Patient's presentation is complicated by their history of atrial fibrillation, tachy-bradycardia syndrome  Social Determinants of Health: Patient's  hard of hearing status    increases the complexity of managing their presentation  Additional history obtained: Additional history obtained from family and EMS  Records reviewed Primary Care Documents  Lab Tests: I Ordered, and personally interpreted labs.  The pertinent results include: Dehydration  Imaging Studies ordered: I ordered imaging studies including CT scan head and C-spine and X-ray chest, pelvis, right lower extremity   I independently visualized and interpreted imaging which showed right femur fracture, right bimalleolar fracture I agree with the radiologist interpretation  Cardiac Monitoring: The patient was maintained on a cardiac monitor.  I personally viewed and interpreted the cardiac monitor which showed an underlying rhythm of:  Atrial Fibrillation   Critical Interventions:   admission  Consultations Obtained: I requested consultation with the admitting physician ddr Blake Divine , and discussed  findings as well as pertinent plan - they recommend: admit  Reevaluation: After the interventions noted above, I reevaluated the patient and found that they have :stayed the same  Complexity of problems addressed: Patient's presentation is most consistent with  acute presentation with potential threat to life or bodily function  Disposition: After consideration of the diagnostic results and the patient's response to treatment,  I feel that the patent would benefit from admission   .           Final Clinical Impression(s) / ED Diagnoses Final diagnoses:  Closed fracture of right hip, initial encounter (HCC)  Closed bimalleolar fracture of right ankle, initial encounter  Other closed fracture of right femur, unspecified portion of femur, initial encounter [facility])    Rx / DC Orders ED Discharge Orders     None         Zadie Rhine, MD 12/06/22 [phone]

## 2022-12-06 NOTE — H&P (Signed)
History and Physical    PatientMarland Kitchen [name] [name]:[id number] DOB: 1923/10/13 DOA: 12/06/2022 DOS: the patient was seen and examined on 12/06/2022 PCP: Susy Frizzle, MD  Patient coming from: Home  Chief Complaint: No chief complaint on file.  HPI: [name] is a 89 y.o. female with medical history significant of  Paroxysmal atrial fibrillation, copd, hypertension, lives by herself at home , was brought in for a fall. Patient is a poor historian, and most of the history was obtained from [place], and her son over the phone.  As per the son. Patient was feeling weak, since 2 days. As per EDP, patient was walking and tripped and fell. She was not sure if she passed out, but remember waking up and used life alert , EMS arrived and brought to ED. She fell on her right side.  She was found to have  Periprosthetic fracture of the distal femoral metaphysis with up to 1.3 cm of lateral displacement and mild impaction.  X rays of the right ankle show  Oblique minimally angulated fracture of the distal fibular dia metaphysis. Displaced fracture of the medial malleolus.  CT of the head and cervical spine negative for acute fractures.  Labs were significant for low calcium level and elevated lactic acid at 2.3. UA Is negative for infection. CK level wnl.   She was referred to [facility] for admission.   Covid pcr was done and came back positive. Her son reported that he was also tested for covid and he was positive.  Influenza pcr is negative. RVP is negative. She denies any sob or chest pain.    Review of Systems: unable to review all systems due to the inability of the patient to answer questions. Past Medical History:  Diagnosis Date   Arthritis    Atrial fibrillation (HCC)    COPD (chronic obstructive pulmonary disease) (HCC)    Hip pain, left    Hypertension    Sinoatrial node dysfunction (HCC)    Past Surgical History:  Procedure Laterality Date   BACK SURGERY     EYE SURGERY  Bilateral    cataract removal   hip arthoplasty     total   KNEE ARTHROPLASTY     PPM GENERATOR CHANGEOUT N/A 08/14/2018   Procedure: PPM GENERATOR CHANGEOUT;  Surgeon: Evans Lance, MD;  Location: [facility];  Service: Cardiovascular;  Laterality: N/A;   uterine polyp removal     Social History:  reports that she has never smoked. She has never used smokeless tobacco. She reports that she does not drink alcohol and does not use drugs.  No Known Allergies  Family History  Problem Relation Age of Onset   Prostate cancer Father    Heart failure Father    Heart disease Father    Stroke Sister    Depression Maternal Grandmother     Prior to Admission medications   Medication Sig Start Date End Date Taking? Authorizing Provider  apixaban (ELIQUIS) 2.5 MG TABS tablet Take 1 tablet (2.5 mg total) by mouth 2 (two) times daily. Dose decrease per MD. 07/31/22   Arnoldo Lenis, MD  furosemide (LASIX) 40 MG tablet Take 1 tablet (40 mg total) by mouth daily as needed. 07/06/21   Arnoldo Lenis, MD  Iron, Ferrous Sulfate, 325 (65 Fe) MG TABS Take 325 mg by mouth daily. 07/12/22   Susy Frizzle, MD  lisinopril (ZESTRIL) 40 MG tablet TAKE 1 TABLET BY MOUTH EVERY DAY 06/27/22  Antoine Poche, MD  metoprolol tartrate (LOPRESSOR) 25 MG tablet TAKE 1 TABLET BY MOUTH TWICE A DAY 08/06/22   Antoine Poche, MD  Multiple Vitamin (MULTIVITAMIN) tablet Take 1 tablet by mouth daily.    [provider]    Physical Exam: Vitals:   12/06/22 0630 12/06/22 0720 12/06/22 0815 12/06/22 0900  BP: (!) 180/80 (!) 157/67 (!) 159/81 (!) 161/135  Pulse: 71 70 60 73  Resp: 17 (!) 22 19 20   Temp:  98.2 F (36.8 C)    TempSrc:  Oral    SpO2: 100% 96% 92% 94%  Weight:      Height:       General exam: Appears calm and comfortable  Respiratory system: Clear to auscultation. Respiratory effort normal. Cardiovascular system: S1 & S2 heard, RRR. No JVD,  No pedal  edema. Gastrointestinal system: Abdomen is nondistended, soft and nontender.  Central nervous system: Alert but confused.  Extremities: Symmetric 5 x 5 power. Skin: No rashes Psychiatry: calm  Data Reviewed: Results for orders placed or performed during the hospital encounter of 12/06/22 (from the past 24 hour(s))  Comprehensive metabolic panel     Status: Abnormal   Collection Time: 12/06/22  5:37 AM  Result Value Ref Range   Sodium 134 (L) 135 - 145 mmol/L   Potassium 3.8 3.5 - 5.1 mmol/L   Chloride 98 98 - 111 mmol/L   CO2 24 22 - 32 mmol/L   Glucose, Bld 150 (H) 70 - 99 mg/dL   BUN 24 (H) 8 - 23 mg/dL   Creatinine, Ser 12/08/22 0.44 - 1.00 mg/dL   Calcium 8.5 (L) 8.9 - 10.3 mg/dL   Total Protein 6.6 6.5 - 8.1 g/dL   Albumin 3.3 (L) 3.5 - 5.0 g/dL   AST 41 15 - 41 U/L   ALT 22 0 - 44 U/L   Alkaline Phosphatase 56 38 - 126 U/L   Total Bilirubin 0.8 0.3 - 1.2 mg/dL   GFR, Estimated 9.56 >21 mL/min   Anion gap 12 5 - 15  CBC     Status: Abnormal   Collection Time: 12/06/22  5:37 AM  Result Value Ref Range   WBC 7.3 4.0 - 10.5 K/uL   RBC 3.53 (L) 3.87 - 5.11 MIL/uL   Hemoglobin 12.3 12.0 - 15.0 g/dL   HCT 12/08/22 86.5 - 78.4 %   MCV 107.6 (H) 80.0 - 100.0 fL   MCH 34.8 (H) 26.0 - 34.0 pg   MCHC 32.4 30.0 - 36.0 g/dL   RDW 69.6 29.5 - 28.4 %   Platelets 127 (L) 150 - 400 K/uL   nRBC 0.0 0.0 - 0.2 %  Ethanol     Status: None   Collection Time: 12/06/22  5:37 AM  Result Value Ref Range   Alcohol, Ethyl (B) <10 <10 mg/dL  Lactic acid, plasma     Status: Abnormal   Collection Time: 12/06/22  5:37 AM  Result Value Ref Range   Lactic Acid, Venous 2.3 (HH) 0.5 - 1.9 mmol/L  Protime-INR     Status: None   Collection Time: 12/06/22  5:37 AM  Result Value Ref Range   Prothrombin Time 15.1 11.4 - 15.2 seconds   INR 1.2 0.8 - 1.2  Sample to Blood Bank     Status: None   Collection Time: 12/06/22  5:37 AM  Result Value Ref Range   Blood Bank Specimen SAMPLE AVAILABLE FOR TESTING     Sample Expiration  12/07/2022,2359 Performed at [facility], [address], [place], [place] [address]   CK     Status: None   Collection Time: 12/06/22  5:37 AM  Result Value Ref Range   Total CK 72 38 - 234 U/L  I-Stat Chem 8, ED     Status: Abnormal   Collection Time: 12/06/22  6:59 AM  Result Value Ref Range   Sodium 135 135 - 145 mmol/L   Potassium 3.8 3.5 - 5.1 mmol/L   Chloride 99 98 - 111 mmol/L   BUN 29 (H) 8 - 23 mg/dL   Creatinine, Ser 7.74 0.44 - 1.00 mg/dL   Glucose, Bld 128 (H) 70 - 99 mg/dL   Calcium, Ion 7.86 (L) 1.15 - 1.40 mmol/L   TCO2 27 22 - 32 mmol/L   Hemoglobin 13.3 12.0 - 15.0 g/dL   HCT 76.7 20.9 - 47.0 %  Resp panel by RT-PCR (RSV, Flu A&B, Covid) Anterior Nasal Swab     Status: Abnormal   Collection Time: 12/06/22 10:02 AM   Specimen: Anterior Nasal Swab  Result Value Ref Range   SARS Coronavirus 2 by RT PCR POSITIVE (A) NEGATIVE   Influenza A by PCR NEGATIVE NEGATIVE   Influenza B by PCR NEGATIVE NEGATIVE   Resp Syncytial Virus by PCR NEGATIVE NEGATIVE  Respiratory (~20 pathogens) panel by PCR     Status: None   Collection Time: 12/06/22 10:02 AM   Specimen: Anterior Nasal Swab; Respiratory  Result Value Ref Range   Adenovirus NOT DETECTED NOT DETECTED   Coronavirus 229E NOT DETECTED NOT DETECTED   Coronavirus HKU1 NOT DETECTED NOT DETECTED   Coronavirus NL63 NOT DETECTED NOT DETECTED   Coronavirus OC43 NOT DETECTED NOT DETECTED   Metapneumovirus NOT DETECTED NOT DETECTED   Rhinovirus / Enterovirus NOT DETECTED NOT DETECTED   Influenza A NOT DETECTED NOT DETECTED   Influenza B NOT DETECTED NOT DETECTED   Parainfluenza Virus 1 NOT DETECTED NOT DETECTED   Parainfluenza Virus 2 NOT DETECTED NOT DETECTED   Parainfluenza Virus 3 NOT DETECTED NOT DETECTED   Parainfluenza Virus 4 NOT DETECTED NOT DETECTED   Respiratory Syncytial Virus NOT DETECTED NOT DETECTED   Bordetella pertussis NOT DETECTED NOT DETECTED   Bordetella Parapertussis  NOT DETECTED NOT DETECTED   Chlamydophila pneumoniae NOT DETECTED NOT DETECTED   Mycoplasma pneumoniae NOT DETECTED NOT DETECTED  Lactic acid, plasma     Status: None   Collection Time: 12/06/22 10:36 AM  Result Value Ref Range   Lactic Acid, Venous 1.6 0.5 - 1.9 mmol/L  Urinalysis, Routine w reflex microscopic -Urine, Clean Catch     Status: Abnormal   Collection Time: 12/06/22 12:50 PM  Result Value Ref Range   Color, Urine YELLOW YELLOW   APPearance CLEAR CLEAR   Specific Gravity, Urine 1.018 1.005 - 1.030   pH 5.0 5.0 - 8.0   Glucose, UA NEGATIVE NEGATIVE mg/dL   Hgb urine dipstick NEGATIVE NEGATIVE   Bilirubin Urine NEGATIVE NEGATIVE   Ketones, ur 5 (A) NEGATIVE mg/dL   Protein, ur NEGATIVE NEGATIVE mg/dL   Nitrite NEGATIVE NEGATIVE   Leukocytes,Ua NEGATIVE NEGATIVE  Lactic acid, plasma     Status: Abnormal   Collection Time: 12/06/22  1:03 PM  Result Value Ref Range   Lactic Acid, Venous 6.4 (HH) 0.5 - 1.9 mmol/L  .   Assessment and Plan:  Mechanical fall with Periprosthetic fracture of the distal femoral metaphysis and angulated fracture of the distal fibular dia metaphysis:  Orthopedics on board and recommended ORIF in the morning.  RLE knee immobilizer in place.  NPO after midnight.     Right medial malleolus fracture;  Pain control and further management as per orthopedics.     COVID positive infection;  She remains asymptomatic and she is not hypoxic.  Will get inflammatory markers and trend.  CXR does not show any infiltrate.     Paroxysmal atrial fibrillation on eliquis, which is on hold.    Hypertension:  BP parameters are optimal.    Elevated lactic acid possibly from dehydration and COVID infection.  Hydrate and trend lactate.      Advance Care Planning:   Code Status: DNR confirmed by the son.   Consults: orthopedics.   Family Communication: discussed with Son on the phone.  Severity of Illness: The appropriate patient status for this  patient is INPATIENT. Inpatient status is judged to be reasonable and necessary in order to provide the required intensity of service to ensure the patient's safety. The patient's presenting symptoms, physical exam findings, and initial radiographic and laboratory data in the context of their chronic comorbidities is felt to place them at high risk for further clinical deterioration. Furthermore, it is not anticipated that the patient will be medically stable for discharge from the hospital within 2 midnights of admission.   * I certify that at the point of admission it is my clinical judgment that the patient will require inpatient hospital care spanning beyond 2 midnights from the point of admission due to high intensity of service, high risk for further deterioration and high frequency of surveillance required.*  Author: Hosie Poisson, MD 12/06/2022 10:03 AM  For on call review [email or web address].

## 2022-12-06 NOTE — ED Notes (Signed)
Ortho tech called to place knee immobilizer

## 2022-12-06 NOTE — ED Notes (Signed)
PURPLE DNR bracelet applied to pt's right wrist next to blood bank band

## 2022-12-06 NOTE — ED Notes (Signed)
Ortho at bedside at this time to place ordered knee immobilizer for patient.

## 2022-12-06 NOTE — ED Notes (Signed)
Trauma Response Nurse Documentation   [name] is a 89 y.o. female arriving to [facility] via [facility]  On Eliquis (apixaban) daily. Trauma was activated as a Level 2 by Lucas Mallow based on the following trauma criteria Elderly patients > 65 with head trauma on anti-coagulation (excluding ASA). Trauma team at the bedside on patient arrival.   Patient cleared for CT by Dr. Christy Gentles. Pt transported to CT with trauma response nurse present to monitor. RN remained with the patient throughout their absence from the department for clinical observation.   GCS 15.  History   Past Medical History:  Diagnosis Date   Arthritis    Atrial fibrillation (HCC)    COPD (chronic obstructive pulmonary disease) (HCC)    Hip pain, left    Hypertension    Sinoatrial node dysfunction (HCC)      Past Surgical History:  Procedure Laterality Date   BACK SURGERY     EYE SURGERY Bilateral    cataract removal   hip arthoplasty     total   KNEE ARTHROPLASTY     PPM GENERATOR CHANGEOUT N/A 08/14/2018   Procedure: PPM GENERATOR CHANGEOUT;  Surgeon: Evans Lance, MD;  Location: [facility];  Service: Cardiovascular;  Laterality: N/A;   uterine polyp removal         Initial Focused Assessment (If applicable, or please see trauma documentation): Airway-- intact, no visible obstruction Breathing-- spontaneous, unlabored Circulation-- no apparent bleeding noted  CT's Completed:   CT Head and CT C-Spine   Interventions:  See event summary  Plan for disposition:  Admission to floor   Consults completed:  none at 0725.  Event Summary: Patient brought in by [facility]. Patient had a fall tonight, patient unable to recall what caused her fall. Patient arrives alert and oriented x4, GCS 15. Complaint of pain to right leg upon moving. Patient transferred to hospital stretcher. Manual BP obtained. Trauma labs obtained. Patient log rolled by staff. Patient denied  any pain on palpation. Xray chest, pelvis, right femur, right tib/fib completed. Patient transported to CT by TRN, TRN remained with the patient throughout transport and for duration of scan. CT head, c-spine completed.  Bedside handoff with ED RN Quentin Angst.    Trudee Kuster  Trauma Response RN  Please call TRN at [phone] for further assistance.

## 2022-12-06 NOTE — ED Provider Notes (Signed)
Discussed pt with ortho, Ambrose Finland, re ortho fxs/injuries - he will see in ED.      Lajean Saver, MD 12/06/22 [phone]

## 2022-12-06 NOTE — H&P (View-Only) (Signed)
Reason for Consult:Right distal femur/ankle fxs Referring Physician: Lajean Saver Time called: 2130 Time at bedside: [name] is an 89 y.o. female.  HPI: [name] was at home and going up some stairs when she tripped and fell. She had immediate pain in her right leg and could not get up. She was brought to the ED as a level 2 trauma activation 2/2 Eliquis use. Workup showed a right periprosthetic femur fx and right bimal fx and orthopedic surgery was consulted. She lives at home alone and ambulates with a RW.  Past Medical History:  Diagnosis Date   Arthritis    Atrial fibrillation (HCC)    COPD (chronic obstructive pulmonary disease) (HCC)    Hip pain, left    Hypertension    Sinoatrial node dysfunction (HCC)     Past Surgical History:  Procedure Laterality Date   BACK SURGERY     EYE SURGERY Bilateral    cataract removal   hip arthoplasty     total   KNEE ARTHROPLASTY     PPM GENERATOR CHANGEOUT N/A 08/14/2018   Procedure: PPM GENERATOR CHANGEOUT;  Surgeon: Evans Lance, MD;  Location: [facility];  Service: Cardiovascular;  Laterality: N/A;   uterine polyp removal      Family History  Problem Relation Age of Onset   Prostate cancer Father    Heart failure Father    Heart disease Father    Stroke Sister    Depression Maternal Grandmother     Social History:  reports that she has never smoked. She has never used smokeless tobacco. She reports that she does not drink alcohol and does not use drugs.  Allergies: No Known Allergies  Medications: I have reviewed the patient's current medications.  Results for orders placed or performed during the hospital encounter of 12/06/22 (from the past 48 hour(s))  Comprehensive metabolic panel     Status: Abnormal   Collection Time: 12/06/22  5:37 AM  Result Value Ref Range   Sodium 134 (L) 135 - 145 mmol/L   Potassium 3.8 3.5 - 5.1 mmol/L   Chloride 98 98 - 111 mmol/L   CO2 24 22 - 32 mmol/L   Glucose,  Bld 150 (H) 70 - 99 mg/dL    Comment: Glucose reference range applies only to samples taken after fasting for at least 8 hours.   BUN 24 (H) 8 - 23 mg/dL   Creatinine, Ser 0.69 0.44 - 1.00 mg/dL   Calcium 8.5 (L) 8.9 - 10.3 mg/dL   Total Protein 6.6 6.5 - 8.1 g/dL   Albumin 3.3 (L) 3.5 - 5.0 g/dL   AST 41 15 - 41 U/L   ALT 22 0 - 44 U/L   Alkaline Phosphatase 56 38 - 126 U/L   Total Bilirubin 0.8 0.3 - 1.2 mg/dL   GFR, Estimated >60 >60 mL/min    Comment: (NOTE) Calculated using the CKD-EPI Creatinine Equation (2021)    Anion gap 12 5 - 15    Comment: Performed at [facility], [place] [address], [place] [address]  CBC     Status: Abnormal   Collection Time: 12/06/22  5:37 AM  Result Value Ref Range   WBC 7.3 4.0 - 10.5 K/uL   RBC 3.53 (L) 3.87 - 5.11 MIL/uL   Hemoglobin 12.3 12.0 - 15.0 g/dL   HCT 38.0 36.0 - 46.0 %   MCV 107.6 (H) 80.0 - 100.0 fL   MCH 34.8 (H)  26.0 - 34.0 pg   MCHC 32.4 30.0 - 36.0 g/dL   RDW 13.3 11.5 - 15.5 %   Platelets 127 (L) 150 - 400 K/uL    Comment: REPEATED TO VERIFY   nRBC 0.0 0.0 - 0.2 %    Comment: Performed at [facility], [address] [place], [place] [address]  Ethanol     Status: None   Collection Time: 12/06/22  5:37 AM  Result Value Ref Range   Alcohol, Ethyl (B) <10 <10 mg/dL    Comment: (NOTE) Lowest detectable limit for serum alcohol is 10 mg/dL.  For medical purposes only. Performed at [facility], [address] [place], [place] [address]   Lactic acid, plasma     Status: Abnormal   Collection Time: 12/06/22  5:37 AM  Result Value Ref Range   Lactic Acid, Venous 2.3 (HH) 0.5 - 1.9 mmol/L    Comment: CRITICAL RESULT CALLED TO, READ BACK BY AND VERIFIED WITH A.DECHAMBEAU,RN. [id number] 12/06/22. LPAIT Performed at [facility], [address] [place], [place] [address]   Protime-INR     Status: None   Collection Time: 12/06/22  5:37 AM  Result Value Ref Range   Prothrombin Time 15.1 11.4 - 15.2  seconds   INR 1.2 0.8 - 1.2    Comment: (NOTE) INR goal varies based on device and disease states. Performed at [facility], [address] [place], [place] [address]   Sample to Blood Bank     Status: None   Collection Time: 12/06/22  5:37 AM  Result Value Ref Range   Blood Bank Specimen SAMPLE AVAILABLE FOR TESTING    Sample Expiration      12/07/2022,2359 Performed at [facility], [address] [place], [place] [address]   CK     Status: None   Collection Time: 12/06/22  5:37 AM  Result Value Ref Range   Total CK 72 38 - 234 U/L    Comment: Performed at [facility], [address] , [place] [address]  I-Stat Chem 8, ED     Status: Abnormal   Collection Time: 12/06/22  6:59 AM  Result Value Ref Range   Sodium 135 135 - 145 mmol/L   Potassium 3.8 3.5 - 5.1 mmol/L   Chloride 99 98 - 111 mmol/L   BUN 29 (H) 8 - 23 mg/dL   Creatinine, Ser 0.60 0.44 - 1.00 mg/dL   Glucose, Bld 140 (H) 70 - 99 mg/dL    Comment: Glucose reference range applies only to samples taken after fasting for at least 8 hours.   Calcium, Ion 1.05 (L) 1.15 - 1.40 mmol/L   TCO2 27 22 - 32 mmol/L   Hemoglobin 13.3 12.0 - 15.0 g/dL   HCT 39.0 36.0 - 46.0 %    CT KNEE RIGHT WO CONTRAST  Result Date: 12/06/2022 CLINICAL DATA:  Knee trauma, occult fracture suspected. Found down. Possible deformity. EXAM: CT OF THE RIGHT KNEE WITHOUT CONTRAST TECHNIQUE: Multidetector CT imaging of the right knee was performed according to the standard protocol. Multiplanar CT image reconstructions were also generated. RADIATION DOSE REDUCTION: This exam was performed according to the departmental dose-optimization program which includes automated exposure control, adjustment of the mA and/or kV according to patient size and/or use of iterative reconstruction technique. COMPARISON:  Radiographs 12/06/2022. FINDINGS: Bones/Joint/Cartilage Status post right total knee arthroplasty with resulting beam  hardening artifact. The bones are diffusely demineralized.   There is a periprosthetic fracture of the distal femoral metaphysis with up to 1.3 cm of lateral displacement, best seen on coronal image 70/7. Along the medial metaphysis, there is only minimal displacement. This fracture appears mildly impacted. The proximal tibia and proximal fibula appear intact. No hardware loosening identified. There is a moderate sized lipohemarthrosis. Ligaments Suboptimally assessed by CT. Muscles and Tendons Mild generalized muscular atrophy about the knee, especially within the medial head of the gastrocnemius muscle. The extensor mechanism appears intact. Soft tissues Moderate soft tissue swelling about the knee, greatest anteriorly and medially. Prominent vascular calcifications are noted. No soft tissue emphysema or unexpected foreign body. IMPRESSION: 1. Periprosthetic fracture of the distal femoral metaphysis with up to 1.3 cm of lateral displacement and mild impaction. 2. No evidence of proximal tibial or fibular fracture. 3. Moderate-sized lipohemarthrosis. 4. Moderate soft tissue swelling about the knee. Electronically Signed   By: Carey Bullocks M.D.   On: 12/06/2022 09:53   DG Knee Right Port  Result Date: 12/06/2022 CLINICAL DATA:  89 year old female status post fall. EXAM: PORTABLE RIGHT KNEE - 1-2 VIEW COMPARISON:  Right femur series today. Previous right knee series 05/25/2014. FINDINGS: Continued malalignment at the distal right femoral condyle with the femoral total knee are arthroplasty component. However, the femoral and tibial arthroplasty components remain aligned. On the lateral view there is evidence of impacted fracture fragments. And I suspect this is a distal femur impaction fracture with about 1/2 shaft width lateral displacement. Underlying advanced osteopenia. Proximal tibia and fibula appear intact. Patella appears intact. There is evidence of a joint effusion, although no obvious lipohemarthrosis  on the cross-table lateral. Advanced calcified peripheral vascular disease. IMPRESSION: 1. Osteopenia with constellation most compatible with an impacted distal right femoral condyle fracture about the femoral component of the total knee arthroplasty. 1/2 shaft width lateral displacement. 2. Femoral and tibial arthroplasty components appear to remain aligned. Evidence of joint effusion. Electronically Signed   By: Odessa Fleming M.D.   On: 12/06/2022 06:51   DG Ankle Right Port  Result Date: 12/06/2022 CLINICAL DATA:  89 year old female status post fall with osteopenia and distal fibula fracture on tib fib series. EXAM: PORTABLE RIGHT ANKLE - 2 VIEW COMPARISON:  Tib fib series today reported separately. FINDINGS: Advanced calcified peripheral vascular disease. Oblique distal right fibula fracture is fairly centered at the metadiaphysis and tracks to the level of the tibial plafond. Superimposed minimally displaced medial malleolus fracture. Posterior malleolus seems to remain intact along with the talar dome and calcaneus. Calcaneus degenerative spurring. Relatively maintained mortise joint alignment. Regional soft tissue swelling. Grossly intact visible other bones of the right foot. IMPRESSION: 1. Osteopenia with minimally displaced bimalleolar type fracture 2. Regional soft tissue swelling. Advanced calcified peripheral vascular disease. Electronically Signed   By: Odessa Fleming M.D.   On: 12/06/2022 06:49   DG Pelvis Portable  Result Date: 12/06/2022 CLINICAL DATA:  89 year old female status post fall on blood thinners. EXAM: PORTABLE PELVIS 1-2 VIEWS COMPARISON:  Right femur series today.  Right hip series 05/25/2014. FINDINGS: Portable AP supine view at 0540 hours. Osteopenia. Partially visible right hip arthroplasty. On these images it is difficult to exclude an acute impacted fracture of the right femur medial intertrochanteric segment. No superimposed pelvis fracture identified. Left femoral head remains  normally located. Chronic lower lumbar fusion hardware. Calcified iliofemoral atherosclerosis. Negative visible bowel gas pattern. IMPRESSION: 1. On this image there is strong suspicion of impacted Acute Fracture At The Proximal Right Femur Intertrochanteric  segment, superimposed on chronic right hip arthroplasty. This was not apparent on the right femur series today. 2. Osteopenia.  No superimposed pelvis fracture identified. Electronically Signed   By: Odessa Fleming M.D.   On: 12/06/2022 06:20   DG FEMUR PORT, 1V RIGHT  Result Date: 12/06/2022 CLINICAL DATA:  89 year old female status post fall on blood thinners. EXAM: RIGHT FEMUR PORTABLE 1 VIEW COMPARISON:  Right hip series 05/25/2014. Right knee series 06/09/2014. FINDINGS: Chronic right hip arthroplasty. Underlying osteopenia. Right hip hardware appears stable and intact on these two views. Visible right hemipelvis grossly intact. Healed fracture of the proximal right femur with some callus formation at the intertrochanteric segment. Right femoral shaft appears intact. However, there appears to be malalignment of the distal right femur knee arthroplasty hardware. But the alignment with the tibial plateau hardware seems maintained. Extensive right femoral and lower extremity calcified peripheral vascular disease. IMPRESSION: 1. Recommend dedicated Right Knee Series for suspected distal femur fracture, malalignment at the femoral component of the right total knee arthroplasty. 2. Osteopenia. No definite additional right femur fracture. Grossly stable chronic right hip arthroplasty. 3. Advanced calcified peripheral vascular disease. Electronically Signed   By: Odessa Fleming M.D.   On: 12/06/2022 06:18   DG Tibia/Fibula Right Port  Result Date: 12/06/2022 CLINICAL DATA:  89 year old female status post fall on blood thinners. EXAM: PORTABLE RIGHT TIBIA AND FIBULA - 2 VIEW COMPARISON:  Right knee series 06/09/2014. FINDINGS: Calcified peripheral vascular disease.  Osteopenia. Partially visible chronic right total knee arthroplasty. Oblique minimally displaced fracture of the distal right fibula shaft near the junction with the metadiaphysis. This is only apparent on image #1. Distal tibia and mortise joint alignment grossly maintained. Tib fib midshaft and proximal tibia and fibula appear to remain intact. Widespread soft tissue swelling, stranding. No soft tissue gas identified. Partially visible extensive foot soft tissue swelling. Calcaneus appears intact. IMPRESSION: 1. Osteopenia with subtle oblique fracture of the distal right fibula shaft, beginning about 5 cm proximal to the ankle joint. Dedicated right ankle series would be valuable. 2. No other right tib fib fracture identified. Partially visible chronic knee arthroplasty. 3. Severe calcified peripheral vascular disease. Electronically Signed   By: Odessa Fleming M.D.   On: 12/06/2022 06:15   DG Chest Port 1 View  Result Date: 12/06/2022 CLINICAL DATA:  89 year old female with history of trauma from a fall. On blood thinners. EXAM: PORTABLE CHEST 1 VIEW COMPARISON:  Chest x-ray 07/02/2019. FINDINGS: Lung volumes are normal. No consolidative airspace disease. Diffuse interstitial prominence and widespread peribronchial cuffing, unchanged. No pleural effusions. No pneumothorax. No evidence of pulmonary edema. Heart size is mildly enlarged. Upper mediastinal contours are within normal limits allowing for patient positioning. Atherosclerotic calcifications in the thoracic aorta. Left-sided pacemaker device in place with lead tips projecting over the expected location of the right atrium and right ventricle. IMPRESSION: 1. No definite radiographic evidence of acute cardiopulmonary disease. The appearance of the lungs suggests chronic bronchitis, similar to prior examination. 2. Cardiomegaly. 3. Aortic atherosclerosis. Electronically Signed   By: Trudie Reed M.D.   On: 12/06/2022 06:13   CT HEAD WO CONTRAST  Result  Date: 12/06/2022 CLINICAL DATA:  89 year old female with history of trauma after a fall. EXAM: CT HEAD WITHOUT CONTRAST CT CERVICAL SPINE WITHOUT CONTRAST TECHNIQUE: Multidetector CT imaging of the head and cervical spine was performed following the standard protocol without intravenous contrast. Multiplanar CT image reconstructions of the cervical spine were also generated. RADIATION DOSE REDUCTION: This exam  was performed according to the departmental dose-optimization program which includes automated exposure control, adjustment of the mA and/or kV according to patient size and/or use of iterative reconstruction technique. COMPARISON:  Head CT 11/12/2016.  No prior cervical spine CT. FINDINGS: CT HEAD FINDINGS Brain: Moderate cerebral and mild cerebellar atrophy. Patchy and confluent areas of decreased attenuation are noted throughout the deep and periventricular white matter of the cerebral hemispheres bilaterally, compatible with chronic microvascular ischemic disease. Well-defined area of low attenuation in the right parietal region, similar to the prior study, compatible with encephalomalacia from an old right MCA/PCA territory watershed infarct. There is also a well-defined area of low attenuation in the periphery of the right cerebellar hemisphere, similar to the prior study, compatible with an old infarct. Multiple tiny well-defined foci of low attenuation are noted scattered throughout the basal ganglia bilaterally, compatible with old lacunar infarcts. No evidence of acute infarction, hemorrhage, hydrocephalus, extra-axial collection or mass lesion/mass effect. Vascular: No hyperdense vessel or unexpected calcification. Skull: Normal. Negative for fracture or focal lesion. Sinuses/Orbits: No acute finding. Other: None. CT CERVICAL SPINE FINDINGS Alignment: Reversal of normal cervical lordosis centered at the level of C4, likely positional and/or chronic and degenerative in nature. Associated with this  area is 3 mm of anterolisthesis of C3 upon C4 and 2 mm of anterolisthesis of C4 upon C5. Skull base and vertebrae: No acute fracture. No primary bone lesion or focal pathologic process. Soft tissues and spinal canal: No prevertebral fluid or swelling. No visible canal hematoma. Disc levels: Severe multilevel degenerative disc disease, most pronounced at C4-C5, C5-C6 and C6-C7. Severe multilevel facet arthropathy bilaterally. Upper chest: Unremarkable. Other: None. IMPRESSION: 1. No evidence of significant acute traumatic injury to the skull, brain or cervical spine. 2. Moderate cerebral and mild cerebellar atrophy with extensive chronic microvascular ischemic changes in the cerebral white matter, multiple old lacunar infarcts throughout the basal ganglia bilaterally, old right cerebellar infarcts, and old right MCA/PCA territory infarct, as detailed above. 3. Severe multilevel degenerative disc disease and cervical spondylosis. Electronically Signed   By: Trudie Reed M.D.   On: 12/06/2022 06:10   CT CERVICAL SPINE WO CONTRAST  Result Date: 12/06/2022 CLINICAL DATA:  89 year old female with history of trauma after a fall. EXAM: CT HEAD WITHOUT CONTRAST CT CERVICAL SPINE WITHOUT CONTRAST TECHNIQUE: Multidetector CT imaging of the head and cervical spine was performed following the standard protocol without intravenous contrast. Multiplanar CT image reconstructions of the cervical spine were also generated. RADIATION DOSE REDUCTION: This exam was performed according to the departmental dose-optimization program which includes automated exposure control, adjustment of the mA and/or kV according to patient size and/or use of iterative reconstruction technique. COMPARISON:  Head CT 11/12/2016.  No prior cervical spine CT. FINDINGS: CT HEAD FINDINGS Brain: Moderate cerebral and mild cerebellar atrophy. Patchy and confluent areas of decreased attenuation are noted throughout the deep and periventricular white  matter of the cerebral hemispheres bilaterally, compatible with chronic microvascular ischemic disease. Well-defined area of low attenuation in the right parietal region, similar to the prior study, compatible with encephalomalacia from an old right MCA/PCA territory watershed infarct. There is also a well-defined area of low attenuation in the periphery of the right cerebellar hemisphere, similar to the prior study, compatible with an old infarct. Multiple tiny well-defined foci of low attenuation are noted scattered throughout the basal ganglia bilaterally, compatible with old lacunar infarcts. No evidence of acute infarction, hemorrhage, hydrocephalus, extra-axial collection or mass lesion/mass effect. Vascular: No hyperdense  vessel or unexpected calcification. Skull: Normal. Negative for fracture or focal lesion. Sinuses/Orbits: No acute finding. Other: None. CT CERVICAL SPINE FINDINGS Alignment: Reversal of normal cervical lordosis centered at the level of C4, likely positional and/or chronic and degenerative in nature. Associated with this area is 3 mm of anterolisthesis of C3 upon C4 and 2 mm of anterolisthesis of C4 upon C5. Skull base and vertebrae: No acute fracture. No primary bone lesion or focal pathologic process. Soft tissues and spinal canal: No prevertebral fluid or swelling. No visible canal hematoma. Disc levels: Severe multilevel degenerative disc disease, most pronounced at C4-C5, C5-C6 and C6-C7. Severe multilevel facet arthropathy bilaterally. Upper chest: Unremarkable. Other: None. IMPRESSION: 1. No evidence of significant acute traumatic injury to the skull, brain or cervical spine. 2. Moderate cerebral and mild cerebellar atrophy with extensive chronic microvascular ischemic changes in the cerebral white matter, multiple old lacunar infarcts throughout the basal ganglia bilaterally, old right cerebellar infarcts, and old right MCA/PCA territory infarct, as detailed above. 3. Severe  multilevel degenerative disc disease and cervical spondylosis. Electronically Signed   By: Vinnie Langton M.D.   On: 12/06/2022 06:10    Review of Systems  HENT:  Negative for ear discharge, ear pain, hearing loss and tinnitus.   Eyes:  Negative for photophobia and pain.  Respiratory:  Negative for cough and shortness of breath.   Cardiovascular:  Negative for chest pain.  Gastrointestinal:  Negative for abdominal pain, nausea and vomiting.  Genitourinary:  Negative for dysuria, flank pain, frequency and urgency.  Musculoskeletal:  Positive for arthralgias (Right knee/ankle). Negative for back pain, myalgias and neck pain.  Neurological:  Negative for dizziness and headaches.  Hematological:  Does not bruise/bleed easily.  Psychiatric/Behavioral:  The patient is not nervous/anxious.    Blood pressure (!) 161/135, pulse 73, temperature 98.2 F (36.8 C), temperature source Oral, resp. rate 20, height 5\' 5"  (1.651 m), weight 60.8 kg, SpO2 94 %. Physical Exam Constitutional:      General: She is not in acute distress.    Appearance: She is well-developed. She is not diaphoretic.  HENT:     Head: Normocephalic and atraumatic.  Eyes:     General: No scleral icterus.       Right eye: No discharge.        Left eye: No discharge.     Conjunctiva/sclera: Conjunctivae normal.  Cardiovascular:     Rate and Rhythm: Normal rate and regular rhythm.  Pulmonary:     Effort: Pulmonary effort is normal. No respiratory distress.  Musculoskeletal:     Cervical back: Normal range of motion.     Comments: RLE No traumatic wounds, ecchymosis, or rash  Mild TTP knee, short leg splint in place  No knee effusion, compartments soft  Sens DPN, SPN, TN intact  Motor EHL 5/5  Toes perfused, No significant edema  Skin:    General: Skin is warm and dry.  Neurological:     Mental Status: She is alert.  Psychiatric:        Mood and Affect: Mood normal.        Behavior: Behavior normal.      Assessment/Plan: Right periprosthetic distal femur fx -- Plan ORIF tomorrow with Dr. Doreatha Martin. Please keep NPO after MN. Right ankle fx -- As above Multiple medical problems including afib on Eliquis, COPD, and HTN -- per primary service. Please hold Eliquis.    Lisette Abu, PA-C Orthopedic Surgery [phone] 12/06/2022, 10:03 AM    Patient  seen and examined and agree with the note above.  Patient with a right periprosthetic distal femur fracture and a right bimalleolar ankle fracture.  Due to the unstable nature of her injuries I recommend proceeding with open reduction internal fixation.  Risk benefits were discussed with the patient and her son over the phone.  Risks include but not limited to bleeding, infection, malunion, nonunion, hardware failure, hardware irritation, nerve or blood vessel injury, DVT, even the possibility anesthetic complications.  She agrees to proceed with surgery and consent was obtained.  Roby Lofts, MD [facility] [phone] (office) [email or web address]

## 2022-12-06 NOTE — ED Notes (Signed)
Provider made aware of patient's lactic acid of 6.4.

## 2022-12-06 NOTE — Progress Notes (Signed)
Orthopedic Tech Progress Note Patient Details:  [name] November 03, 1923 [id number]  Knee immobilizer placed to RLE in best obtainable fashion.   Ortho Devices Type of Ortho Device: Knee Immobilizer Ortho Device/Splint Location: RLE Ortho Device/Splint Interventions: Ordered, Application, Adjustment   Post Interventions Patient Tolerated: Well Instructions Provided: Care of device  [name] Jeri Modena 12/06/2022, 2:52 PM

## 2022-12-06 NOTE — ED Notes (Signed)
Messaged provider in regards to patient's oral temperature of 100.6 Requesting PRN tylenol for patient at this time.

## 2022-12-06 NOTE — Progress Notes (Signed)
Orthopedic Tech Progress Note Patient Details:  [name] September 02, 1923 [id number]  Patient ID: [name], female   DOB: [birth year], 89 y.o.   MRN: [id number] Level II; not currently needed. Vernona Rieger 12/06/2022, 5:36 AM

## 2022-12-06 NOTE — Progress Notes (Signed)
Orthopedic Tech Progress Note Patient Details:  [name] [facility] [birth year] [id number]  Ortho Devices Type of Ortho Device: Short leg splint, Stirrup splint Ortho Device/Splint Location: RLE Ortho Device/Splint Interventions: Ordered, Adjustment, Application   Post Interventions Patient Tolerated: Well Instructions Provided: Care of device Splint applied with assistance from RN. [name] 12/06/2022, 7:01 AM

## 2022-12-06 NOTE — ED Triage Notes (Signed)
BIB EMS from home found laying on the floor with right leg bent. Possible deformity. Unknown LOC and downtime. Lives alone. Pt reports waling down some steps, altered at baseline. On eliquis  Received a total of 126mcg Fentanyl en route

## 2022-12-07 ENCOUNTER — Inpatient Hospital Stay (HOSPITAL_COMMUNITY): Payer: Medicare Other | Admitting: Anesthesiology

## 2022-12-07 ENCOUNTER — Other Ambulatory Visit: Payer: Self-pay

## 2022-12-07 ENCOUNTER — Inpatient Hospital Stay (HOSPITAL_COMMUNITY): Payer: Medicare Other

## 2022-12-07 ENCOUNTER — Encounter (HOSPITAL_COMMUNITY)
Admission: EM | Disposition: A | Discharge status: Skilled Nursing Facility | Payer: Self-pay | Source: Home / Self Care | Attending: Internal Medicine

## 2022-12-07 ENCOUNTER — Encounter (HOSPITAL_COMMUNITY): Payer: Self-pay | Admitting: Internal Medicine

## 2022-12-07 DIAGNOSIS — S72401A Unspecified fracture of lower end of right femur, initial encounter for closed fracture: Secondary | ICD-10-CM

## 2022-12-07 DIAGNOSIS — S82841A Displaced bimalleolar fracture of right lower leg, initial encounter for closed fracture: Secondary | ICD-10-CM

## 2022-12-07 DIAGNOSIS — S72001A Fracture of unspecified part of neck of right femur, initial encounter for closed fracture: Secondary | ICD-10-CM | POA: Diagnosis not present

## 2022-12-07 DIAGNOSIS — I4891 Unspecified atrial fibrillation: Secondary | ICD-10-CM

## 2022-12-07 DIAGNOSIS — I119 Hypertensive heart disease without heart failure: Secondary | ICD-10-CM

## 2022-12-07 DIAGNOSIS — U071 COVID-19: Secondary | ICD-10-CM | POA: Diagnosis present

## 2022-12-07 HISTORY — PX: ORIF ANKLE FRACTURE: SHX5408

## 2022-12-07 HISTORY — PX: ORIF FEMUR FRACTURE: SHX2119

## 2022-12-07 LAB — COMPREHENSIVE METABOLIC PANEL
ALT: 18 U/L (ref 0–44)
AST: 37 U/L (ref 15–41)
Albumin: 2.4 g/dL — ABNORMAL LOW (ref 3.5–5.0)
Alkaline Phosphatase: 43 U/L (ref 38–126)
Anion gap: 6 (ref 5–15)
BUN: 15 mg/dL (ref 8–23)
CO2: 25 mmol/L (ref 22–32)
Calcium: 7.5 mg/dL — ABNORMAL LOW (ref 8.9–10.3)
Chloride: 101 mmol/L (ref 98–111)
Creatinine, Ser: 0.55 mg/dL (ref 0.44–1.00)
GFR, Estimated: >60 mL/min (ref >60)
Glucose, Bld: 95 mg/dL (ref 70–99)
Potassium: 3.6 mmol/L (ref 3.5–5.1)
Sodium: 132 mmol/L — ABNORMAL LOW (ref 135–145)
Total Bilirubin: 0.3 mg/dL (ref 0.3–1.2)
Total Protein: 4.9 g/dL — ABNORMAL LOW (ref 6.5–8.1)

## 2022-12-07 LAB — CBC
HCT: 28.1 % — ABNORMAL LOW (ref 36.0–46.0)
Hemoglobin: 9.2 g/dL — ABNORMAL LOW (ref 12.0–15.0)
MCH: 33.7 pg (ref 26.0–34.0)
MCHC: 32.7 g/dL (ref 30.0–36.0)
MCV: 102.9 fL — ABNORMAL HIGH (ref 80.0–100.0)
Platelets: 79 10*3/uL — ABNORMAL LOW (ref 150–400)
RBC: 2.73 MIL/uL — ABNORMAL LOW (ref 3.87–5.11)
RDW: 13.5 % (ref 11.5–15.5)
WBC: 5.3 10*3/uL (ref 4.0–10.5)
nRBC: 0 % (ref 0.0–0.2)

## 2022-12-07 LAB — LACTIC ACID, PLASMA: Lactic Acid, Venous: 1.7 mmol/L (ref 0.5–1.9)

## 2022-12-07 LAB — C-REACTIVE PROTEIN: CRP: 1.7 mg/dL — ABNORMAL HIGH (ref <1.0)

## 2022-12-07 SURGERY — OPEN REDUCTION INTERNAL FIXATION (ORIF) ANKLE FRACTURE
Anesthesia: General | Site: Leg Upper | Laterality: Right

## 2022-12-07 MED ORDER — FENTANYL CITRATE (PF) 250 MCG/5ML IJ SOLN
INTRAMUSCULAR | Status: DC | PRN
  Administered 2022-12-07: 50 ug via INTRAVENOUS

## 2022-12-07 MED ORDER — LIDOCAINE 2% (20 MG/ML) 5 ML SYRINGE
INTRAMUSCULAR | Status: DC | PRN
  Administered 2022-12-07: 40 mg via INTRAVENOUS

## 2022-12-07 MED ORDER — TRANEXAMIC ACID-NACL 1000-0.7 MG/100ML-% IV SOLN
1000.0000 mg | Freq: Once | INTRAVENOUS | Status: AC
  Administered 2022-12-07: 1000 mg via INTRAVENOUS
  Filled 2022-12-07: qty 100

## 2022-12-07 MED ORDER — METOCLOPRAMIDE HCL 5 MG/ML IJ SOLN: 5.0000 mg | Freq: Three times a day (TID) | INTRAMUSCULAR | Status: DC | PRN

## 2022-12-07 MED ORDER — FENTANYL CITRATE (PF) 250 MCG/5ML IJ SOLN
INTRAMUSCULAR | Status: AC
  Filled 2022-12-07: qty 5

## 2022-12-07 MED ORDER — PHENYLEPHRINE HCL-NACL 20-0.9 MG/250ML-% IV SOLN
INTRAVENOUS | Status: DC | PRN
  Administered 2022-12-07: 50 ug/min via INTRAVENOUS

## 2022-12-07 MED ORDER — VANCOMYCIN HCL 1000 MG IV SOLR
INTRAVENOUS | Status: AC
  Filled 2022-12-07: qty 20

## 2022-12-07 MED ORDER — LIDOCAINE 2% (20 MG/ML) 5 ML SYRINGE
INTRAMUSCULAR | Status: AC
  Filled 2022-12-07: qty 5

## 2022-12-07 MED ORDER — DEXAMETHASONE SODIUM PHOSPHATE 10 MG/ML IJ SOLN
INTRAMUSCULAR | Status: DC | PRN
  Administered 2022-12-07: 5 mg via INTRAVENOUS

## 2022-12-07 MED ORDER — CEFAZOLIN SODIUM-DEXTROSE 2-4 GM/100ML-% IV SOLN
2.0000 g | Freq: Three times a day (TID) | INTRAVENOUS | Status: AC
  Administered 2022-12-07 – 2022-12-08 (×3): 2 g via INTRAVENOUS
  Filled 2022-12-07 (×3): qty 100

## 2022-12-07 MED ORDER — ONDANSETRON HCL 4 MG/2ML IJ SOLN: 4.0000 mg | Freq: Once | INTRAMUSCULAR | Status: DC | PRN

## 2022-12-07 MED ORDER — FENTANYL CITRATE (PF) 100 MCG/2ML IJ SOLN: 25.0000 ug | INTRAMUSCULAR | Status: DC | PRN

## 2022-12-07 MED ORDER — STERILE WATER FOR IRRIGATION IR SOLN
Status: DC | PRN
  Administered 2022-12-07: 1000 mL

## 2022-12-07 MED ORDER — METOCLOPRAMIDE HCL 5 MG PO TABS: 5.0000 mg | ORAL_TABLET | Freq: Three times a day (TID) | ORAL | Status: DC | PRN

## 2022-12-07 MED ORDER — ONDANSETRON HCL 4 MG/2ML IJ SOLN
INTRAMUSCULAR | Status: DC | PRN
  Administered 2022-12-07: 4 mg via INTRAVENOUS

## 2022-12-07 MED ORDER — PHENYLEPHRINE 80 MCG/ML (10ML) SYRINGE FOR IV PUSH (FOR BLOOD PRESSURE SUPPORT)
PREFILLED_SYRINGE | INTRAVENOUS | Status: DC | PRN
  Administered 2022-12-07: 80 ug via INTRAVENOUS

## 2022-12-07 MED ORDER — ROCURONIUM BROMIDE 10 MG/ML (PF) SYRINGE
PREFILLED_SYRINGE | INTRAVENOUS | Status: DC | PRN
  Administered 2022-12-07: 40 mg via INTRAVENOUS
  Administered 2022-12-07: 15 mg via INTRAVENOUS

## 2022-12-07 MED ORDER — PROPOFOL 10 MG/ML IV BOLUS
INTRAVENOUS | Status: AC
  Filled 2022-12-07: qty 20

## 2022-12-07 MED ORDER — CEFAZOLIN SODIUM-DEXTROSE 2-3 GM-%(50ML) IV SOLR
INTRAVENOUS | Status: DC | PRN
  Administered 2022-12-07: 2 g via INTRAVENOUS

## 2022-12-07 MED ORDER — SUGAMMADEX SODIUM 200 MG/2ML IV SOLN
INTRAVENOUS | Status: DC | PRN
  Administered 2022-12-07: 200 mg via INTRAVENOUS

## 2022-12-07 MED ORDER — ROCURONIUM BROMIDE 10 MG/ML (PF) SYRINGE
PREFILLED_SYRINGE | INTRAVENOUS | Status: AC
  Filled 2022-12-07: qty 10

## 2022-12-07 MED ORDER — METOPROLOL TARTRATE 25 MG PO TABS
25.0000 mg | ORAL_TABLET | Freq: Two times a day (BID) | ORAL | Status: DC
  Administered 2022-12-07 – 2022-12-11 (×7): 25 mg via ORAL
  Filled 2022-12-07 (×9): qty 1

## 2022-12-07 MED ORDER — POLYETHYLENE GLYCOL 3350 17 G PO PACK: 17.0000 g | PACK | Freq: Every day | ORAL | Status: DC | PRN

## 2022-12-07 MED ORDER — DOCUSATE SODIUM 100 MG PO CAPS
100.0000 mg | ORAL_CAPSULE | Freq: Two times a day (BID) | ORAL | Status: DC
  Administered 2022-12-07 – 2022-12-11 (×9): 100 mg via ORAL
  Filled 2022-12-07 (×9): qty 1

## 2022-12-07 MED ORDER — VANCOMYCIN HCL 1000 MG IV SOLR
INTRAVENOUS | Status: DC | PRN
  Administered 2022-12-07 (×2): 1000 mg via TOPICAL

## 2022-12-07 MED ORDER — PROPOFOL 10 MG/ML IV BOLUS
INTRAVENOUS | Status: DC | PRN
  Administered 2022-12-07: 90 mg via INTRAVENOUS

## 2022-12-07 MED ORDER — 0.9 % SODIUM CHLORIDE (POUR BTL) OPTIME
TOPICAL | Status: DC | PRN
  Administered 2022-12-07: 1000 mL

## 2022-12-07 SURGICAL SUPPLY — 113 items
ADH SKN CLS APL DERMABOND .7 (GAUZE/BANDAGES/DRESSINGS) ×2
APL PRP STRL LF DISP 70% ISPRP (MISCELLANEOUS) ×4
BAG COUNTER SPONGE SURGICOUNT (BAG) ×3 IMPLANT
BAG SPNG CNTER NS LX DISP (BAG) ×2
BANDAGE ESMARK 6X9 LF (GAUZE/BANDAGES/DRESSINGS) ×3 IMPLANT
BIT DRILL 4.3 (BIT) ×2 IMPLANT
BIT DRILL 4.3X300MM (BIT) IMPLANT
BIT DRILL LONG 3.3 (BIT) IMPLANT
BIT DRILL QC 2.0 SHORT EVOS SM (DRILL) IMPLANT
BIT DRILL QC 2.5MM SHRT EVO SM (DRILL) IMPLANT
BIT DRILL QC 3.3X195 (BIT) IMPLANT
BLADE CLIPPER SURG (BLADE) IMPLANT
BNDG CMPR 9X6 STRL LF SNTH (GAUZE/BANDAGES/DRESSINGS) ×2
BNDG CMPR MED 10X6 ELC LF (GAUZE/BANDAGES/DRESSINGS)
BNDG COHESIVE 4X5 TAN STRL (GAUZE/BANDAGES/DRESSINGS) ×3 IMPLANT
BNDG COHESIVE 6X5 TAN STRL LF (GAUZE/BANDAGES/DRESSINGS) ×3 IMPLANT
BNDG ELASTIC 4X5.8 VLCR STR LF (GAUZE/BANDAGES/DRESSINGS) IMPLANT
BNDG ELASTIC 6X10 VLCR STRL LF (GAUZE/BANDAGES/DRESSINGS) ×3 IMPLANT
BNDG ELASTIC 6X5.8 VLCR STR LF (GAUZE/BANDAGES/DRESSINGS) IMPLANT
BNDG ESMARK 6X9 LF (GAUZE/BANDAGES/DRESSINGS) ×2
BOWL SMART MIX CTS (DISPOSABLE) IMPLANT
BRUSH SCRUB EZ PLAIN DRY (MISCELLANEOUS) ×6 IMPLANT
CANISTER SUCT 3000ML PPV (MISCELLANEOUS) ×3 IMPLANT
CAP LOCK NCB (Cap) IMPLANT
CEMENT BONE SIMPLEX SPEEDSET (Cement) IMPLANT
CHLORAPREP W/TINT 26 (MISCELLANEOUS) ×3 IMPLANT
COVER MAYO STAND STRL (DRAPES) IMPLANT
COVER SURGICAL LIGHT HANDLE (MISCELLANEOUS) ×3 IMPLANT
DERMABOND ADVANCED .7 DNX12 (GAUZE/BANDAGES/DRESSINGS) IMPLANT
DRAPE C-ARM 42X72 X-RAY (DRAPES) ×3 IMPLANT
DRAPE C-ARMOR (DRAPES) ×3 IMPLANT
DRAPE HALF SHEET 40X57 (DRAPES) ×6 IMPLANT
DRAPE ORTHO SPLIT 77X108 STRL (DRAPES) ×4
DRAPE SURG 17X23 STRL (DRAPES) ×3 IMPLANT
DRAPE SURG ORHT 6 SPLT 77X108 (DRAPES) ×6 IMPLANT
DRAPE U-SHAPE 47X51 STRL (DRAPES) ×3 IMPLANT
DRILL BIT 4.3 (BIT) ×2
DRILL QC 2.0 SHORT EVOS SM (DRILL) ×2
DRILL QC 2.5MM SHORT EVOS SM (DRILL) ×2
DRSG ADAPTIC 3X8 NADH LF (GAUZE/BANDAGES/DRESSINGS) IMPLANT
DRSG MEPILEX BORDER 4X12 (GAUZE/BANDAGES/DRESSINGS) IMPLANT
DRSG MEPILEX BORDER 4X4 (GAUZE/BANDAGES/DRESSINGS) IMPLANT
DRSG MEPILEX BORDER 4X8 (GAUZE/BANDAGES/DRESSINGS) IMPLANT
DRSG MEPILEX POST OP 4X8 (GAUZE/BANDAGES/DRESSINGS) IMPLANT
DRSG MEPITEL 4X7.2 (GAUZE/BANDAGES/DRESSINGS) IMPLANT
ELECT REM PT RETURN 9FT ADLT (ELECTROSURGICAL) ×2
ELECTRODE REM PT RTRN 9FT ADLT (ELECTROSURGICAL) ×3 IMPLANT
GAUZE PAD ABD 8X10 STRL (GAUZE/BANDAGES/DRESSINGS) ×9 IMPLANT
GAUZE SPONGE 4X4 12PLY STRL (GAUZE/BANDAGES/DRESSINGS) ×3 IMPLANT
GLOVE BIO SURGEON STRL SZ 6.5 (GLOVE) ×9 IMPLANT
GLOVE BIO SURGEON STRL SZ7.5 (GLOVE) ×12 IMPLANT
GLOVE BIOGEL PI IND STRL 6.5 (GLOVE) ×3 IMPLANT
GLOVE BIOGEL PI IND STRL 7.5 (GLOVE) ×3 IMPLANT
GOWN STRL REUS W/ TWL LRG LVL3 (GOWN DISPOSABLE) ×9 IMPLANT
GOWN STRL REUS W/TWL LRG LVL3 (GOWN DISPOSABLE) ×6
K-WIRE 2.0 (WIRE) ×2
K-WIRE FXSTD 280X2XNS SS (WIRE) ×2
KIT BASIN OR (CUSTOM PROCEDURE TRAY) ×3 IMPLANT
KIT TURNOVER KIT B (KITS) ×3 IMPLANT
KWIRE FXSTD 280X2XNS SS (WIRE) IMPLANT
MANIFOLD NEPTUNE II (INSTRUMENTS) ×3 IMPLANT
NDL HYPO 21X1.5 SAFETY (NEEDLE) IMPLANT
NDL HYPO 25GX1X1/2 BEV (NEEDLE) ×3 IMPLANT
NEEDLE HYPO 21X1.5 SAFETY (NEEDLE) IMPLANT
NEEDLE HYPO 25GX1X1/2 BEV (NEEDLE) IMPLANT
NS IRRIG 1000ML POUR BTL (IV SOLUTION) ×3 IMPLANT
PACK TOTAL JOINT (CUSTOM PROCEDURE TRAY) ×3 IMPLANT
PAD ARMBOARD 7.5X6 YLW CONV (MISCELLANEOUS) ×6 IMPLANT
PAD CAST 4YDX4 CTTN HI CHSV (CAST SUPPLIES) ×3 IMPLANT
PADDING CAST COTTON 4X4 STRL (CAST SUPPLIES) ×2
PADDING CAST COTTON 6X4 STRL (CAST SUPPLIES) ×3 IMPLANT
PLATE FEM DIST NCB PP 278MM (Plate) IMPLANT
PLATE FIB EVOS 2.7/3.5 7H R103 (Plate) IMPLANT
SCREW 5.0 80MM (Screw) IMPLANT
SCREW CORT 2.7X14 T8 EVOS (Screw) IMPLANT
SCREW CORT 2.7X15 T8 ST EVOS (Screw) IMPLANT
SCREW CORT 2.7X16 STAR T8 EVOS (Screw) IMPLANT
SCREW CORT 3.5X15 ST EVOS (Screw) IMPLANT
SCREW CORT EVOS ST 3.5X12 (Screw) IMPLANT
SCREW CORT ST EVOS 3.5X55 (Screw) IMPLANT
SCREW CORT ST EVOS 3.5X65 (Screw) IMPLANT
SCREW CORTICAL NCB 5.0X65 (Screw) IMPLANT
SCREW CTX 3.5X50MM EVOS (Screw) IMPLANT
SCREW EVOS 2.7X18 LOCK T8 (Screw) IMPLANT
SCREW LOCK 2.7X13 ST EVOS (Screw) IMPLANT
SCREW LOCK EVOS 3.5X11 (Screw) IMPLANT
SCREW LOCK ST EVOS 3.5X12 (Screw) IMPLANT
SCREW NCB 3.5X75X5X6.2XST (Screw) IMPLANT
SCREW NCB 4.0MX34M (Screw) IMPLANT
SCREW NCB 4.0X36MM (Screw) IMPLANT
SCREW NCB 5.0X36MM (Screw) IMPLANT
SCREW NCB 5.0X38 (Screw) IMPLANT
SCREW NCB 5.0X75MM (Screw) ×2 IMPLANT
SCREW NCB 5.0X85MM (Screw) IMPLANT
SPONGE T-LAP 18X18 ~~LOC~~+RFID (SPONGE) IMPLANT
STAPLER VISISTAT 35W (STAPLE) ×3 IMPLANT
SUCTION FRAZIER HANDLE 10FR (MISCELLANEOUS) ×2
SUCTION TUBE FRAZIER 10FR DISP (MISCELLANEOUS) ×3 IMPLANT
SUT ETHILON 3 0 PS 1 (SUTURE) ×6 IMPLANT
SUT MNCRL AB 3-0 PS2 27 (SUTURE) IMPLANT
SUT PROLENE 0 CT (SUTURE) IMPLANT
SUT VIC AB 0 CT1 27 (SUTURE) ×2
SUT VIC AB 0 CT1 27XBRD ANBCTR (SUTURE) ×3 IMPLANT
SUT VIC AB 1 CT1 27 (SUTURE)
SUT VIC AB 1 CT1 27XBRD ANBCTR (SUTURE) IMPLANT
SUT VIC AB 2-0 CT1 27 (SUTURE) ×4
SUT VIC AB 2-0 CT1 TAPERPNT 27 (SUTURE) ×6 IMPLANT
SYR CONTROL 10ML LL (SYRINGE) ×3 IMPLANT
TOWEL GREEN STERILE (TOWEL DISPOSABLE) ×6 IMPLANT
TOWEL GREEN STERILE FF (TOWEL DISPOSABLE) ×3 IMPLANT
TRAY FOLEY MTR SLVR 16FR STAT (SET/KITS/TRAYS/PACK) IMPLANT
UNDERPAD 30X36 HEAVY ABSORB (UNDERPADS AND DIAPERS) ×3 IMPLANT
WATER STERILE IRR 1000ML POUR (IV SOLUTION) ×6 IMPLANT

## 2022-12-07 NOTE — Hospital Course (Signed)
89 y.o. female with medical history significant of  Paroxysmal atrial fibrillation, copd, hypertension, lives by herself at home , was brought in for a fall. Pt was found to have periprosthetic fracture of the distal femoral metaphysis and minimally angulated fracture of distal fibula and displaced fracture of medial malleolus. Pt found to be COVID pos

## 2022-12-07 NOTE — Anesthesia Postprocedure Evaluation (Signed)
Anesthesia Post Note  Patient: Pamla H Haeberle  Procedure(s) Performed: OPEN REDUCTION INTERNAL FIXATION (ORIF) ANKLE FRACTURE (Right: Ankle) OPEN REDUCTION INTERNAL FIXATION (ORIF) DISTAL FEMUR (Right: Leg Upper)     Patient location during evaluation: PACU Anesthesia Type: General Level of consciousness: awake and alert Pain management: pain level controlled Vital Signs Assessment: post-procedure vital signs reviewed and stable Respiratory status: spontaneous breathing, nonlabored ventilation, respiratory function stable and patient connected to nasal cannula oxygen Cardiovascular status: blood pressure returned to baseline and stable Postop Assessment: no apparent nausea or vomiting Anesthetic complications: no   No notable events documented.  Last Vitals:  Vitals:   12/07/22 1330 12/07/22 1412  BP: 106/60 114/65  Pulse: 66 84  Resp: 18 16  Temp:  36.6 C  SpO2: 100% 100%    Last Pain:  Vitals:   12/07/22 1412  TempSrc: Oral  PainSc: 0-No pain                 Delsie Amador A.

## 2022-12-07 NOTE — Progress Notes (Signed)
  Progress Note   Patient: [name] MWU:[id number] DOB: 1923-03-29 DOA: 12/06/2022     1 DOS: the patient was seen and examined on 12/07/2022   Brief hospital course: 89 y.o. female with medical history significant of  Paroxysmal atrial fibrillation, copd, hypertension, lives by herself at home , was brought in for a fall. Pt was found to have periprosthetic fracture of the distal femoral metaphysis and minimally angulated fracture of distal fibula and displaced fracture of medial malleolus. Pt found to be COVID pos  Assessment and Plan: Mechanical fall with Periprosthetic fracture of the distal femoral metaphysis and angulated fracture of the distal fibular dia metaphysis: Orthopedic Surgery consulted, now s/p surgery 12/07/22 Follow up with PT/OT recs    Right medial malleolus fracture;  Orthopedic surgery following Cont with analgesia as needed    COVID positive infection;  She remains asymptomatic and she is not hypoxic.  Cont to follow inflammatory markers, CRP today 1.7  Paroxysmal atrial fibrillation on eliquis, which is on hold.    Hypertension:  BP parameters are optimal.     Elevated lactic acid possibly from dehydration and COVID infection.  Normalized after hydration      Subjective: without complaints this AM  Physical Exam: Vitals:   12/07/22 1304 12/07/22 1315 12/07/22 1330 12/07/22 1412  BP: 123/74 (!) 104/57 106/60 114/65  Pulse: 70 66 66 84  Resp: 15 17 18 16   Temp: 98 F (36.7 C)   97.9 F (36.6 C)  TempSrc:    Oral  SpO2: 100% 100% 100% 100%  Weight:      Height:       General exam: Awake, laying in bed, in nad Respiratory system: Normal respiratory effort, no wheezing Cardiovascular system: regular rate, s1, s2 Gastrointestinal system: Soft, nondistended, positive BS Central nervous system: CN2-12 grossly intact, strength intact Extremities: Perfused, no clubbing Skin: Normal skin turgor, no notable skin lesions seen Psychiatry: Mood  normal // no visual hallucinations   Data Reviewed:  Labs reviewed: Na 132, K 3.6, Cr 0.55, hgb 9.2  Family Communication: Pt in room, family not at bedside  Disposition: Status is: Inpatient Remains inpatient appropriate because: Severity of illness  Planned Discharge Destination:  Pending PT eval    Author: Marylu Lund, MD 12/07/2022 4:56 PM  For on call review [email or web address].

## 2022-12-07 NOTE — ED Notes (Signed)
ED TO INPATIENT HANDOFF REPORT  ED Nurse Name and Phone #: Heavenlee Maiorana [phone]  S Name/Age/Gender [name] 89 y.o. female Room/Bed: 008C/008C  Code Status   Code Status: DNR  Home/SNF/Other Rehab Patient oriented to: self, time, and situation Is this baseline? Yes   Triage Complete: Triage complete  Chief Complaint Fall [W19.XXXA]  Triage Note BIB EMS from home found laying on the floor with right leg bent. Possible deformity. Unknown LOC and downtime. Lives alone. Pt reports waling down some steps, altered at baseline. On eliquis  Received a total of Fentanyl en route     Allergies No Known Allergies  Level of Care/Admitting Diagnosis ED Disposition     ED Disposition  Admit   Condition  --   Comment  Hospital Area: [facility] [[id number]]  Level of Care: Progressive [102]  Admit to Progressive based on following criteria: MULTISYSTEM THREATS such as stable sepsis, metabolic/electrolyte imbalance with or without encephalopathy that is responding to early treatment.  May admit patient to [facility] or [facility] if equivalent level of care is available:: Yes  Covid Evaluation: Asymptomatic - no recent exposure (last 10 days) testing not required  Diagnosis: Fall [[id number]]  Admitting Physician: Kathlen Mody [[id number]]  Attending Physician: Kathlen Mody [phone]  Certification:: I certify this patient will need inpatient services for at least 2 midnights  Estimated Length of Stay: 2          B Medical/Surgery History Past Medical History:  Diagnosis Date   Arthritis    Atrial fibrillation (HCC)    COPD (chronic obstructive pulmonary disease) (HCC)    Hip pain, left    Hypertension    Sinoatrial node dysfunction (HCC)    Past Surgical History:  Procedure Laterality Date   BACK SURGERY     EYE SURGERY Bilateral    cataract removal   hip arthoplasty     total   KNEE ARTHROPLASTY     PPM GENERATOR CHANGEOUT N/A 08/14/2018    Procedure: PPM GENERATOR CHANGEOUT;  Surgeon: Marinus Maw, MD;  Location: [facility];  Service: Cardiovascular;  Laterality: N/A;   uterine polyp removal       A IV Location/Drains/Wounds Patient Lines/Drains/Airways Status     Active Line/Drains/Airways     Name Placement date Placement time Site Days   Peripheral IV 12/06/22 22 G Left Antecubital 12/06/22  0530  Antecubital  1   Peripheral IV 12/06/22 22 G Posterior;Right Forearm 12/06/22  1448  Forearm  1   External Urinary Catheter 12/06/22  8416  --  1   Wound / Incision (Open or Dehisced) 11/13/16 Laceration Head Posterior 11/13/16  0745  Head  2215            Intake/Output Last 24 hours No intake or output data in the 24 hours ending 12/07/22 1052  Labs/Imaging Results for orders placed or performed during the hospital encounter of 12/06/22 (from the past 48 hour(s))  Comprehensive metabolic panel     Status: Abnormal   Collection Time: 12/06/22  5:37 AM  Result Value Ref Range   Sodium 134 (L) 135 - 145 mmol/L   Potassium 3.8 3.5 - 5.1 mmol/L   Chloride 98 98 - 111 mmol/L   CO2 24 22 - 32 mmol/L   Glucose, Bld 150 (H) 70 - 99 mg/dL    Comment: Glucose reference range applies only to samples taken after fasting for at least 8 hours.   BUN 24 (  H) 8 - 23 mg/dL   Creatinine, Ser 1.610.69 0.44 - 1.00 mg/dL   Calcium 8.5 (L) 8.9 - 10.3 mg/dL   Total Protein 6.6 6.5 - 8.1 g/dL   Albumin 3.3 (L) 3.5 - 5.0 g/dL   AST 41 15 - 41 U/L   ALT 22 0 - 44 U/L   Alkaline Phosphatase 56 38 - 126 U/L   Total Bilirubin 0.8 0.3 - 1.2 mg/dL   GFR, Estimated >09>60 >60>60 mL/min    Comment: (NOTE) Calculated using the CKD-EPI Creatinine Equation (2021)    Anion gap 12 5 - 15    Comment: Performed at [facility], [address] [place], [place] [address]  CBC     Status: Abnormal   Collection Time: 12/06/22  5:37 AM  Result Value Ref Range   WBC 7.3 4.0 - 10.5 K/uL   RBC 3.53 (L) 3.87 - 5.11 MIL/uL   Hemoglobin 12.3  12.0 - 15.0 g/dL   HCT 81.138.0 91.436.0 - 78.246.0 %   MCV 107.6 (H) 80.0 - 100.0 fL   MCH 34.8 (H) 26.0 - 34.0 pg   MCHC 32.4 30.0 - 36.0 g/dL   RDW 95.613.3 21.311.5 - 08.615.5 %   Platelets 127 (L) 150 - 400 K/uL    Comment: REPEATED TO VERIFY   nRBC 0.0 0.0 - 0.2 %    Comment: Performed at [facility], [address] [place], [place] [address]  Ethanol     Status: None   Collection Time: 12/06/22  5:37 AM  Result Value Ref Range   Alcohol, Ethyl (B) <10 <10 mg/dL    Comment: (NOTE) Lowest detectable limit for serum alcohol is 10 mg/dL.  For medical purposes only. Performed at [facility], [address] [place], [place] [address]   Lactic acid, plasma     Status: Abnormal   Collection Time: 12/06/22  5:37 AM  Result Value Ref Range   Lactic Acid, Venous 2.3 (HH) 0.5 - 1.9 mmol/L    Comment: CRITICAL RESULT CALLED TO, READ BACK BY AND VERIFIED WITH A.DECHAMBEAU,RN. [id number] 12/06/22. LPAIT Performed at [facility], [address] [place], [place] [address]   Protime-INR     Status: None   Collection Time: 12/06/22  5:37 AM  Result Value Ref Range   Prothrombin Time 15.1 11.4 - 15.2 seconds   INR 1.2 0.8 - 1.2    Comment: (NOTE) INR goal varies based on device and disease states. Performed at [facility], [address] [place], [place] [address]   Sample to Blood Bank     Status: None   Collection Time: 12/06/22  5:37 AM  Result Value Ref Range   Blood Bank Specimen SAMPLE AVAILABLE FOR TESTING    Sample Expiration      12/07/2022,2359 Performed at [facility], [address] [place], [place] [address]   CK     Status: None   Collection Time: 12/06/22  5:37 AM  Result Value Ref Range   Total CK 72 38 - 234 U/L    Comment: Performed at [facility], [address] [place], [place] [address]  I-Stat Chem 8, ED     Status: Abnormal   Collection Time: 12/06/22  6:59 AM  Result Value Ref Range   Sodium 135 135 - 145 mmol/L   Potassium 3.8 3.5 -  5.1 mmol/L   Chloride 99 98 - 111 mmol/L   BUN  29 (H) 8 - 23 mg/dL   Creatinine, Ser 0.60 0.44 - 1.00 mg/dL   Glucose, Bld 140 (H) 70 - 99 mg/dL    Comment: Glucose reference range applies only to samples taken after fasting for at least 8 hours.   Calcium, Ion 1.05 (L) 1.15 - 1.40 mmol/L   TCO2 27 22 - 32 mmol/L   Hemoglobin 13.3 12.0 - 15.0 g/dL   HCT 39.0 36.0 - 46.0 %  Resp panel by RT-PCR (RSV, Flu A&B, Covid) Anterior Nasal Swab     Status: Abnormal   Collection Time: 12/06/22 10:02 AM   Specimen: Anterior Nasal Swab  Result Value Ref Range   SARS Coronavirus 2 by RT PCR POSITIVE (A) NEGATIVE    Comment: (NOTE) SARS-CoV-2 target nucleic acids are DETECTED.  The SARS-CoV-2 RNA is generally detectable in upper respiratory specimens during the acute phase of infection. Positive results are indicative of the presence of the identified virus, but do not rule out bacterial infection or co-infection with other pathogens not detected by the test. Clinical correlation with patient history and other diagnostic information is necessary to determine patient infection status. The expected result is Negative.  Fact Sheet for Patients: EntrepreneurPulse.com.au  Fact Sheet for Healthcare Providers: IncredibleEmployment.be  This test is not yet approved or cleared by the [place] FDA and  has been authorized for detection and/or diagnosis of SARS-CoV-2 by FDA under an Emergency Use Authorization (EUA).  This EUA will remain in effect (meaning this test can be used) for the duration of  the COVID-19 declaration under Section 564(b)(1) of the A ct, 21 U.S.C. section 360bbb-3(b)(1), unless the authorization is terminated or revoked sooner.     Influenza A by PCR NEGATIVE NEGATIVE   Influenza B by PCR NEGATIVE NEGATIVE    Comment: (NOTE) The Xpert Xpress SARS-CoV-2/FLU/RSV plus assay is intended as an aid in the diagnosis of influenza from  Nasopharyngeal swab specimens and should not be used as a sole basis for treatment. Nasal washings and aspirates are unacceptable for Xpert Xpress SARS-CoV-2/FLU/RSV testing.  Fact Sheet for Patients: EntrepreneurPulse.com.au  Fact Sheet for Healthcare Providers: IncredibleEmployment.be  This test is not yet approved or cleared by the [place] FDA and has been authorized for detection and/or diagnosis of SARS-CoV-2 by FDA under an Emergency Use Authorization (EUA). This EUA will remain in effect (meaning this test can be used) for the duration of the COVID-19 declaration under Section 564(b)(1) of the Act, 21 U.S.C. section 360bbb-3(b)(1), unless the authorization is terminated or revoked.     Resp Syncytial Virus by PCR NEGATIVE NEGATIVE    Comment: (NOTE) Fact Sheet for Patients: EntrepreneurPulse.com.au  Fact Sheet for Healthcare Providers: IncredibleEmployment.be  This test is not yet approved or cleared by the [place] FDA and has been authorized for detection and/or diagnosis of SARS-CoV-2 by FDA under an Emergency Use Authorization (EUA). This EUA will remain in effect (meaning this test can be used) for the duration of the COVID-19 declaration under Section 564(b)(1) of the Act, 21 U.S.C. section 360bbb-3(b)(1), unless the authorization is terminated or revoked.  Performed at [facility], [place] [address], [place], [place] [address]   Respiratory (~20 pathogens) panel by PCR     Status: None   Collection Time: 12/06/22 10:02 AM   Specimen: Anterior Nasal Swab; Respiratory  Result Value Ref Range   Adenovirus NOT DETECTED NOT DETECTED   Coronavirus 229E NOT DETECTED NOT DETECTED    Comment: (NOTE) The Coronavirus  on the Respiratory Panel, DOES NOT test for the novel  Coronavirus (2019 nCoV)    Coronavirus HKU1 NOT DETECTED NOT DETECTED   Coronavirus NL63 NOT DETECTED NOT  DETECTED   Coronavirus OC43 NOT DETECTED NOT DETECTED   Metapneumovirus NOT DETECTED NOT DETECTED   Rhinovirus / Enterovirus NOT DETECTED NOT DETECTED   Influenza A NOT DETECTED NOT DETECTED   Influenza B NOT DETECTED NOT DETECTED   Parainfluenza Virus 1 NOT DETECTED NOT DETECTED   Parainfluenza Virus 2 NOT DETECTED NOT DETECTED   Parainfluenza Virus 3 NOT DETECTED NOT DETECTED   Parainfluenza Virus 4 NOT DETECTED NOT DETECTED   Respiratory Syncytial Virus NOT DETECTED NOT DETECTED   Bordetella pertussis NOT DETECTED NOT DETECTED   Bordetella Parapertussis NOT DETECTED NOT DETECTED   Chlamydophila pneumoniae NOT DETECTED NOT DETECTED   Mycoplasma pneumoniae NOT DETECTED NOT DETECTED    Comment: Performed at [facility], [address] [place], [place] [address]  Lactic acid, plasma     Status: None   Collection Time: 12/06/22 10:36 AM  Result Value Ref Range   Lactic Acid, Venous 1.6 0.5 - 1.9 mmol/L    Comment: Performed at [facility], [address] [place], [place] [address]  Urinalysis, Routine w reflex microscopic -Urine, Clean Catch     Status: Abnormal   Collection Time: 12/06/22 12:50 PM  Result Value Ref Range   Color, Urine YELLOW YELLOW   APPearance CLEAR CLEAR   Specific Gravity, Urine 1.018 1.005 - 1.030   pH 5.0 5.0 - 8.0   Glucose, UA NEGATIVE NEGATIVE mg/dL   Hgb urine dipstick NEGATIVE NEGATIVE   Bilirubin Urine NEGATIVE NEGATIVE   Ketones, ur 5 (A) NEGATIVE mg/dL   Protein, ur NEGATIVE NEGATIVE mg/dL   Nitrite NEGATIVE NEGATIVE   Leukocytes,Ua NEGATIVE NEGATIVE    Comment: Performed at [facility], [address] [place], [place] [address]  Lactic acid, plasma     Status: Abnormal   Collection Time: 12/06/22  1:03 PM  Result Value Ref Range   Lactic Acid, Venous 6.4 (HH) 0.5 - 1.9 mmol/L    Comment: CRITICAL RESULT CALLED TO, READ BACK BY AND VERIFIED WITH A.KALAFUT RN 1451 12/06/22 MCCORMICK K Performed at [facility],  [address] [place], [place] [address]   Culture, blood (Routine X 2) w Reflex to ID Panel     Status: None (Preliminary result)   Collection Time: 12/06/22  3:14 PM   Specimen: BLOOD  Result Value Ref Range   Specimen Description BLOOD LEFT ANTECUBITAL    Special Requests      BOTTLES DRAWN AEROBIC AND ANAEROBIC Blood Culture results may not be optimal due to an inadequate volume of blood received in culture bottles   Culture      NO GROWTH < 24 HOURS Performed at [facility], [address] [place], [place] [address]    Report Status PENDING   Culture, blood (Routine X 2) w Reflex to ID Panel     Status: None (Preliminary result)   Collection Time: 12/06/22  3:19 PM   Specimen: BLOOD  Result Value Ref Range   Specimen Description BLOOD RIGHT ANTECUBITAL    Special Requests      BOTTLES DRAWN AEROBIC AND ANAEROBIC Blood Culture adequate volume   Culture      NO GROWTH < 24 HOURS Performed at [facility], [address] [place], [place] [address]  Report Status PENDING   Lactic acid, plasma     Status: None   Collection Time: 12/06/22  9:22 PM  Result Value Ref Range   Lactic Acid, Venous 1.4 0.5 - 1.9 mmol/L    Comment: Performed at [facility], [address], [place], [place] [address]  Ferritin     Status: None   Collection Time: 12/06/22  9:22 PM  Result Value Ref Range   Ferritin 75 11 - 307 ng/mL    Comment: Performed at [facility], [address], [place], [place] [address]  Hepatic function panel     Status: Abnormal   Collection Time: 12/06/22  9:22 PM  Result Value Ref Range   Total Protein 5.2 (L) 6.5 - 8.1 g/dL   Albumin 2.5 (L) 3.5 - 5.0 g/dL   AST 34 15 - 41 U/L   ALT 21 0 - 44 U/L   Alkaline Phosphatase 41 38 - 126 U/L   Total Bilirubin 0.2 (L) 0.3 - 1.2 mg/dL   Bilirubin, Direct 0.2 0.0 - 0.2 mg/dL   Indirect Bilirubin 0.0 (L) 0.3 - 0.9 mg/dL    Comment: Performed at [facility], [address] [place],  [place] [address]  Lactic acid, plasma     Status: None   Collection Time: 12/06/22 11:52 PM  Result Value Ref Range   Lactic Acid, Venous 1.7 0.5 - 1.9 mmol/L    Comment: Performed at [facility], [address] [place], [place] [address]  Comprehensive metabolic panel     Status: Abnormal   Collection Time: 12/07/22  3:17 AM  Result Value Ref Range   Sodium 132 (L) 135 - 145 mmol/L   Potassium 3.6 3.5 - 5.1 mmol/L   Chloride 101 98 - 111 mmol/L   CO2 25 22 - 32 mmol/L   Glucose, Bld 95 70 - 99 mg/dL    Comment: Glucose reference range applies only to samples taken after fasting for at least 8 hours.   BUN 15 8 - 23 mg/dL   Creatinine, Ser 4.17 0.44 - 1.00 mg/dL   Calcium 7.5 (L) 8.9 - 10.3 mg/dL   Total Protein 4.9 (L) 6.5 - 8.1 g/dL   Albumin 2.4 (L) 3.5 - 5.0 g/dL   AST 37 15 - 41 U/L   ALT 18 0 - 44 U/L   Alkaline Phosphatase 43 38 - 126 U/L   Total Bilirubin 0.3 0.3 - 1.2 mg/dL   GFR, Estimated >40 >81 mL/min    Comment: (NOTE) Calculated using the CKD-EPI Creatinine Equation (2021)    Anion gap 6 5 - 15    Comment: Performed at [facility], [address], [place], [place] [address]  CBC     Status: Abnormal   Collection Time: 12/07/22  3:17 AM  Result Value Ref Range   WBC 5.3 4.0 - 10.5 K/uL   RBC 2.73 (L) 3.87 - 5.11 MIL/uL   Hemoglobin 9.2 (L) 12.0 - 15.0 g/dL    Comment: REPEATED TO VERIFY   HCT 28.1 (L) 36.0 - 46.0 %   MCV 102.9 (H) 80.0 - 100.0 fL   MCH 33.7 26.0 - 34.0 pg   MCHC 32.7 30.0 - 36.0 g/dL   RDW 56.3 14.9 - 70.2 %   Platelets 79 (L) 150 - 400 K/uL    Comment: SPECIMEN CHECKED FOR CLOTS Immature Platelet Fraction may be clinically indicated, consider ordering this additional test [id number] REPEATED TO VERIFY  nRBC 0.0 0.0 - 0.2 %    Comment: Performed at [facility], [address], [place], [place] [address]  C-reactive protein     Status: Abnormal   Collection Time: 12/07/22  3:17 AM  Result Value Ref Range   CRP 1.7 (H)  <1.0 mg/dL    Comment: Performed at [facility], [address], [place], [place] [address]   DG Ankle Complete Right  Result Date: 12/06/2022 CLINICAL DATA:  Right ankle fracture EXAM: RIGHT ANKLE - COMPLETE 3+ VIEW COMPARISON:  12/06/2022 FINDINGS: Generalized osteopenia. Oblique minimally angulated fracture of the distal fibular diametaphysis. Mm displaced fracture of the medial malleolus. No other fracture or dislocation. Lateral subluxation of the talar dome relative to the tibial plafond. Soft tissue swelling around the ankle. IMPRESSION: 1. Oblique minimally angulated fracture of the distal fibular diametaphysis. 2. Displaced fracture of the medial malleolus. Electronically Signed   By: Elige Ko M.D.   On: 12/06/2022 10:24   CT KNEE RIGHT WO CONTRAST  Result Date: 12/06/2022 CLINICAL DATA:  Knee trauma, occult fracture suspected. Found down. Possible deformity. EXAM: CT OF THE RIGHT KNEE WITHOUT CONTRAST TECHNIQUE: Multidetector CT imaging of the right knee was performed according to the standard protocol. Multiplanar CT image reconstructions were also generated. RADIATION DOSE REDUCTION: This exam was performed according to the departmental dose-optimization program which includes automated exposure control, adjustment of the mA and/or kV according to patient size and/or use of iterative reconstruction technique. COMPARISON:  Radiographs 12/06/2022. FINDINGS: Bones/Joint/Cartilage Status post right total knee arthroplasty with resulting beam hardening artifact. The bones are diffusely demineralized. There is a periprosthetic fracture of the distal femoral metaphysis with up to 1.3 cm of lateral displacement, best seen on coronal image 70/7. Along the medial metaphysis, there is only minimal displacement. This fracture appears mildly impacted. The proximal tibia and proximal fibula appear intact. No hardware loosening identified. There is a moderate sized lipohemarthrosis. Ligaments  Suboptimally assessed by CT. Muscles and Tendons Mild generalized muscular atrophy about the knee, especially within the medial head of the gastrocnemius muscle. The extensor mechanism appears intact. Soft tissues Moderate soft tissue swelling about the knee, greatest anteriorly and medially. Prominent vascular calcifications are noted. No soft tissue emphysema or unexpected foreign body. IMPRESSION: 1. Periprosthetic fracture of the distal femoral metaphysis with up to 1.3 cm of lateral displacement and mild impaction. 2. No evidence of proximal tibial or fibular fracture. 3. Moderate-sized lipohemarthrosis. 4. Moderate soft tissue swelling about the knee. Electronically Signed   By: Carey Bullocks M.D.   On: 12/06/2022 09:53   DG Knee Right Port  Result Date: 12/06/2022 CLINICAL DATA:  89 year old female status post fall. EXAM: PORTABLE RIGHT KNEE - 1-2 VIEW COMPARISON:  Right femur series today. Previous right knee series 05/25/2014. FINDINGS: Continued malalignment at the distal right femoral condyle with the femoral total knee are arthroplasty component. However, the femoral and tibial arthroplasty components remain aligned. On the lateral view there is evidence of impacted fracture fragments. And I suspect this is a distal femur impaction fracture with about 1/2 shaft width lateral displacement. Underlying advanced osteopenia. Proximal tibia and fibula appear intact. Patella appears intact. There is evidence of a joint effusion, although no obvious lipohemarthrosis on the cross-table lateral. Advanced calcified peripheral vascular disease. IMPRESSION: 1. Osteopenia with constellation most compatible with an impacted distal right femoral condyle fracture about the femoral component of the total knee arthroplasty. 1/2 shaft width lateral displacement. 2. Femoral and tibial arthroplasty  components appear to remain aligned. Evidence of joint effusion. Electronically Signed   By: Odessa Fleming M.D.   On: 12/06/2022  06:51   DG Ankle Right Port  Result Date: 12/06/2022 CLINICAL DATA:  89 year old female status post fall with osteopenia and distal fibula fracture on tib fib series. EXAM: PORTABLE RIGHT ANKLE - 2 VIEW COMPARISON:  Tib fib series today reported separately. FINDINGS: Advanced calcified peripheral vascular disease. Oblique distal right fibula fracture is fairly centered at the metadiaphysis and tracks to the level of the tibial plafond. Superimposed minimally displaced medial malleolus fracture. Posterior malleolus seems to remain intact along with the talar dome and calcaneus. Calcaneus degenerative spurring. Relatively maintained mortise joint alignment. Regional soft tissue swelling. Grossly intact visible other bones of the right foot. IMPRESSION: 1. Osteopenia with minimally displaced bimalleolar type fracture 2. Regional soft tissue swelling. Advanced calcified peripheral vascular disease. Electronically Signed   By: Odessa Fleming M.D.   On: 12/06/2022 06:49   DG Pelvis Portable  Result Date: 12/06/2022 CLINICAL DATA:  89 year old female status post fall on blood thinners. EXAM: PORTABLE PELVIS 1-2 VIEWS COMPARISON:  Right femur series today.  Right hip series 05/25/2014. FINDINGS: Portable AP supine view at 0540 hours. Osteopenia. Partially visible right hip arthroplasty. On these images it is difficult to exclude an acute impacted fracture of the right femur medial intertrochanteric segment. No superimposed pelvis fracture identified. Left femoral head remains normally located. Chronic lower lumbar fusion hardware. Calcified iliofemoral atherosclerosis. Negative visible bowel gas pattern. IMPRESSION: 1. On this image there is strong suspicion of impacted Acute Fracture At The Proximal Right Femur Intertrochanteric segment, superimposed on chronic right hip arthroplasty. This was not apparent on the right femur series today. 2. Osteopenia.  No superimposed pelvis fracture identified. Electronically Signed    By: Odessa Fleming M.D.   On: 12/06/2022 06:20   DG FEMUR PORT, 1V RIGHT  Result Date: 12/06/2022 CLINICAL DATA:  89 year old female status post fall on blood thinners. EXAM: RIGHT FEMUR PORTABLE 1 VIEW COMPARISON:  Right hip series 05/25/2014. Right knee series 06/09/2014. FINDINGS: Chronic right hip arthroplasty. Underlying osteopenia. Right hip hardware appears stable and intact on these two views. Visible right hemipelvis grossly intact. Healed fracture of the proximal right femur with some callus formation at the intertrochanteric segment. Right femoral shaft appears intact. However, there appears to be malalignment of the distal right femur knee arthroplasty hardware. But the alignment with the tibial plateau hardware seems maintained. Extensive right femoral and lower extremity calcified peripheral vascular disease. IMPRESSION: 1. Recommend dedicated Right Knee Series for suspected distal femur fracture, malalignment at the femoral component of the right total knee arthroplasty. 2. Osteopenia. No definite additional right femur fracture. Grossly stable chronic right hip arthroplasty. 3. Advanced calcified peripheral vascular disease. Electronically Signed   By: Odessa Fleming M.D.   On: 12/06/2022 06:18   DG Tibia/Fibula Right Port  Result Date: 12/06/2022 CLINICAL DATA:  89 year old female status post fall on blood thinners. EXAM: PORTABLE RIGHT TIBIA AND FIBULA - 2 VIEW COMPARISON:  Right knee series 06/09/2014. FINDINGS: Calcified peripheral vascular disease. Osteopenia. Partially visible chronic right total knee arthroplasty. Oblique minimally displaced fracture of the distal right fibula shaft near the junction with the metadiaphysis. This is only apparent on image #1. Distal tibia and mortise joint alignment grossly maintained. Tib fib midshaft and proximal tibia and fibula appear to remain intact. Widespread soft tissue swelling, stranding. No soft tissue gas identified. Partially visible extensive foot soft  tissue swelling.  Calcaneus appears intact. IMPRESSION: 1. Osteopenia with subtle oblique fracture of the distal right fibula shaft, beginning about 5 cm proximal to the ankle joint. Dedicated right ankle series would be valuable. 2. No other right tib fib fracture identified. Partially visible chronic knee arthroplasty. 3. Severe calcified peripheral vascular disease. Electronically Signed   By: Odessa Fleming M.D.   On: 12/06/2022 06:15   DG Chest Port 1 View  Result Date: 12/06/2022 CLINICAL DATA:  89 year old female with history of trauma from a fall. On blood thinners. EXAM: PORTABLE CHEST 1 VIEW COMPARISON:  Chest x-ray 07/02/2019. FINDINGS: Lung volumes are normal. No consolidative airspace disease. Diffuse interstitial prominence and widespread peribronchial cuffing, unchanged. No pleural effusions. No pneumothorax. No evidence of pulmonary edema. Heart size is mildly enlarged. Upper mediastinal contours are within normal limits allowing for patient positioning. Atherosclerotic calcifications in the thoracic aorta. Left-sided pacemaker device in place with lead tips projecting over the expected location of the right atrium and right ventricle. IMPRESSION: 1. No definite radiographic evidence of acute cardiopulmonary disease. The appearance of the lungs suggests chronic bronchitis, similar to prior examination. 2. Cardiomegaly. 3. Aortic atherosclerosis. Electronically Signed   By: Trudie Reed M.D.   On: 12/06/2022 06:13   CT HEAD WO CONTRAST  Result Date: 12/06/2022 CLINICAL DATA:  89 year old female with history of trauma after a fall. EXAM: CT HEAD WITHOUT CONTRAST CT CERVICAL SPINE WITHOUT CONTRAST TECHNIQUE: Multidetector CT imaging of the head and cervical spine was performed following the standard protocol without intravenous contrast. Multiplanar CT image reconstructions of the cervical spine were also generated. RADIATION DOSE REDUCTION: This exam was performed according to the departmental  dose-optimization program which includes automated exposure control, adjustment of the mA and/or kV according to patient size and/or use of iterative reconstruction technique. COMPARISON:  Head CT 11/12/2016.  No prior cervical spine CT. FINDINGS: CT HEAD FINDINGS Brain: Moderate cerebral and mild cerebellar atrophy. Patchy and confluent areas of decreased attenuation are noted throughout the deep and periventricular white matter of the cerebral hemispheres bilaterally, compatible with chronic microvascular ischemic disease. Well-defined area of low attenuation in the right parietal region, similar to the prior study, compatible with encephalomalacia from an old right MCA/PCA territory watershed infarct. There is also a well-defined area of low attenuation in the periphery of the right cerebellar hemisphere, similar to the prior study, compatible with an old infarct. Multiple tiny well-defined foci of low attenuation are noted scattered throughout the basal ganglia bilaterally, compatible with old lacunar infarcts. No evidence of acute infarction, hemorrhage, hydrocephalus, extra-axial collection or mass lesion/mass effect. Vascular: No hyperdense vessel or unexpected calcification. Skull: Normal. Negative for fracture or focal lesion. Sinuses/Orbits: No acute finding. Other: None. CT CERVICAL SPINE FINDINGS Alignment: Reversal of normal cervical lordosis centered at the level of C4, likely positional and/or chronic and degenerative in nature. Associated with this area is 3 mm of anterolisthesis of C3 upon C4 and 2 mm of anterolisthesis of C4 upon C5. Skull base and vertebrae: No acute fracture. No primary bone lesion or focal pathologic process. Soft tissues and spinal canal: No prevertebral fluid or swelling. No visible canal hematoma. Disc levels: Severe multilevel degenerative disc disease, most pronounced at C4-C5, C5-C6 and C6-C7. Severe multilevel facet arthropathy bilaterally. Upper chest: Unremarkable.  Other: None. IMPRESSION: 1. No evidence of significant acute traumatic injury to the skull, brain or cervical spine. 2. Moderate cerebral and mild cerebellar atrophy with extensive chronic microvascular ischemic changes in the cerebral white matter, multiple old  lacunar infarcts throughout the basal ganglia bilaterally, old right cerebellar infarcts, and old right MCA/PCA territory infarct, as detailed above. 3. Severe multilevel degenerative disc disease and cervical spondylosis. Electronically Signed   By: Vinnie Langton M.D.   On: 12/06/2022 06:10   CT CERVICAL SPINE WO CONTRAST  Result Date: 12/06/2022 CLINICAL DATA:  89 year old female with history of trauma after a fall. EXAM: CT HEAD WITHOUT CONTRAST CT CERVICAL SPINE WITHOUT CONTRAST TECHNIQUE: Multidetector CT imaging of the head and cervical spine was performed following the standard protocol without intravenous contrast. Multiplanar CT image reconstructions of the cervical spine were also generated. RADIATION DOSE REDUCTION: This exam was performed according to the departmental dose-optimization program which includes automated exposure control, adjustment of the mA and/or kV according to patient size and/or use of iterative reconstruction technique. COMPARISON:  Head CT 11/12/2016.  No prior cervical spine CT. FINDINGS: CT HEAD FINDINGS Brain: Moderate cerebral and mild cerebellar atrophy. Patchy and confluent areas of decreased attenuation are noted throughout the deep and periventricular white matter of the cerebral hemispheres bilaterally, compatible with chronic microvascular ischemic disease. Well-defined area of low attenuation in the right parietal region, similar to the prior study, compatible with encephalomalacia from an old right MCA/PCA territory watershed infarct. There is also a well-defined area of low attenuation in the periphery of the right cerebellar hemisphere, similar to the prior study, compatible with an old infarct. Multiple  tiny well-defined foci of low attenuation are noted scattered throughout the basal ganglia bilaterally, compatible with old lacunar infarcts. No evidence of acute infarction, hemorrhage, hydrocephalus, extra-axial collection or mass lesion/mass effect. Vascular: No hyperdense vessel or unexpected calcification. Skull: Normal. Negative for fracture or focal lesion. Sinuses/Orbits: No acute finding. Other: None. CT CERVICAL SPINE FINDINGS Alignment: Reversal of normal cervical lordosis centered at the level of C4, likely positional and/or chronic and degenerative in nature. Associated with this area is 3 mm of anterolisthesis of C3 upon C4 and 2 mm of anterolisthesis of C4 upon C5. Skull base and vertebrae: No acute fracture. No primary bone lesion or focal pathologic process. Soft tissues and spinal canal: No prevertebral fluid or swelling. No visible canal hematoma. Disc levels: Severe multilevel degenerative disc disease, most pronounced at C4-C5, C5-C6 and C6-C7. Severe multilevel facet arthropathy bilaterally. Upper chest: Unremarkable. Other: None. IMPRESSION: 1. No evidence of significant acute traumatic injury to the skull, brain or cervical spine. 2. Moderate cerebral and mild cerebellar atrophy with extensive chronic microvascular ischemic changes in the cerebral white matter, multiple old lacunar infarcts throughout the basal ganglia bilaterally, old right cerebellar infarcts, and old right MCA/PCA territory infarct, as detailed above. 3. Severe multilevel degenerative disc disease and cervical spondylosis. Electronically Signed   By: Vinnie Langton M.D.   On: 12/06/2022 06:10    Pending Labs Unresulted Labs (From admission, onward)     Start     Ordered   12/13/22 0500  Creatinine, serum  (enoxaparin (LOVENOX)    CrCl >/= 30 ml/min)  Weekly,   R     Comments: while on enoxaparin therapy    12/06/22 1803   12/07/22 0500  C-reactive protein  Daily,   R      12/06/22 2027   12/06/22 1004  Urine  Culture (for pregnant, neutropenic or urologic patients or patients with an indwelling urinary catheter)  (Urine Labs)  Once,   R       Question:  Indication  Answer:  Altered mental status (if no other cause identified)  12/06/22 1003            Vitals/Pain Today's Vitals   12/07/22 0730 12/07/22 0830 12/07/22 0900 12/07/22 1000  BP: 93/74 (!) 165/69 (!) 160/83 (!) 162/70  Pulse: 69 78 69 69  Resp: 19 20 14 20   Temp:    98.2 F (36.8 C)  TempSrc:      SpO2: 99% 99% 100% 100%  Weight:      Height:      PainSc:        Isolation Precautions Droplet and Contact precautions  Medications Medications  enoxaparin (LOVENOX) injection 40 mg (40 mg Subcutaneous Given 12/06/22 2346)  ondansetron (ZOFRAN) tablet 4 mg (has no administration in time range)    Or  ondansetron (ZOFRAN) injection 4 mg (has no administration in time range)  senna-docusate (Senokot-S) tablet 1 tablet (has no administration in time range)  traMADol (ULTRAM) tablet 50 mg (has no administration in time range)  hydrALAZINE (APRESOLINE) tablet 25 mg (has no administration in time range)  lactated ringers infusion ( Intravenous New Bag/Given 12/07/22 1003)  acetaminophen (TYLENOL) tablet 650 mg (650 mg Oral Given 12/06/22 1456)  morphine (PF) 2 MG/ML injection 1 mg (has no administration in time range)  sodium chloride 0.9 % bolus 1,000 mL (0 mLs Intravenous Stopped 12/06/22 1005)    Mobility Walks with device at baseline, bed rest currently      R Recommendations: See Admitting Provider Note  Report given to:   Additional Notes: BIB EMS from home, lives alone, unknown downtime, has R hip, ankle, femur fx, getting ORIF today, in OR now

## 2022-12-07 NOTE — Interval H&P Note (Signed)
History and Physical Interval Note:  12/07/2022 9:54 AM  [name]  has presented today for surgery, with the diagnosis of right distal femur fracture and ankle fracture.  The various methods of treatment have been discussed with the patient and family. After consideration of risks, benefits and other options for treatment, the patient has consented to  Procedure(s): OPEN REDUCTION INTERNAL FIXATION (ORIF) ANKLE FRACTURE (Right) OPEN REDUCTION INTERNAL FIXATION (ORIF) DISTAL FEMUR FRACTURE (Right) as a surgical intervention.  The patient's history has been reviewed, patient examined, no change in status, stable for surgery.  I have reviewed the patient's chart and labs.  Questions were answered to the patient's satisfaction.     Lennette Bihari P Destyn Schuyler

## 2022-12-07 NOTE — Op Note (Signed)
Orthopaedic Surgery Operative Note (CSN: [id number] ) Date of Surgery: 12/07/2022  Admit Date: 12/06/2022   Diagnoses: Pre-Op Diagnoses: Right periprosthetic distal femur fracture Right bimalleolar ankle fracture  Post-Op Diagnosis: Same  Procedures: CPT 27511-Open reduction internal fixation of right distal femur fracture CPT 27814-Open reduction internal fixation of right bimalleolar ankle fracture CPT 27829-Open reduction internal fixation of right syndesmosis  Surgeons : Primary: Roby Lofts, MD  Assistant: Ulyses Southward, PA-C  Location: OR 3   Anesthesia:General   Antibiotics: Ancef 2g preop with 1 gm vancomycin powder placed in each the distal femur incision and the ankle incision   Tourniquet time: None    Estimated Blood Loss:250 mL  Complications:None   Specimens:None    Implants: Implant Name Type Inv. Item Serial No. Manufacturer Lot No. LRB No. Used Action  CEMENT BONE SIMPLEX SPEEDSET - [id number] Cement CEMENT BONE SIMPLEX SPEEDSET  [employer] [id number] Right 1 Implanted  PLATE FEM DIST NCB PP - [id number] Plate PLATE FEM DIST NCB PP  [employer]IM[employer](ORTH,TRAU,BIO,SG)  Right 1 Implanted  SCREW NCB 5.0X85MM - [id number] Screw SCREW NCB 5.0X85MM  [employer]ON(ORTH,TRAU,BIO,SG)  Right 1 Implanted  5.0 MM NCB SCREW X 75    [employer](ORTH,TRAU,BIO,SG)  Right 1 Implanted  SCREW NCB 5.0X36MM - [id number] Screw SCREW NCB 5.0X36MM  [employer](ORTH,TRAU,BIO,SG)  Right 1 Implanted  SCREW NCB 5.0X38 - [id number] Screw SCREW NCB 5.0X38  [employer](ORTH,TRAU,BIO,SG)  Right 1 Implanted  SCREW 5.0 - [id number] Screw SCREW 5.0  [employer]ON(ORTH,TRAU,BIO,SG)  Right 2 Implanted  SCREW CORTICAL NCB 5.0X65 - [id number] Screw SCREW CORTICAL NCB 5.0X65  [employer]ON(ORTH,TRAU,BIO,SG)  Right 1 Implanted  SCREW NCB 4.0X36MM - [id number] Screw SCREW NCB 4.0X36MM  [employer]ON(ORTH,TRAU,BIO,SG)  Right 1 Implanted  SCREW NCB 4.0MX34M - [id number]  Screw SCREW NCB 4.0MX34M  [employer]ON(ORTH,TRAU,BIO,SG)  Right 1 Implanted  CAP LOCK NCB - [id number] Cap CAP LOCK NCB  [employer](ORTH,TRAU,BIO,SG)  Right 7 Implanted  SCREW LOCK EVOS 3.5X11 - [id number] Screw SCREW LOCK EVOS 3.5X11  [employer]  Right 1 Implanted  SCREW LOCK ST EVOS 3.5X12 - [id number] Screw SCREW LOCK ST EVOS 3.5X12  [employer]  Right 1 Implanted  SCREW CORT EVOS ST 3.5X12 - [id number] Screw SCREW CORT EVOS ST 3.5X12  [employer]  Right 1 Implanted  SCREW CORT 3.5X15 ST EVOS - [id number] Screw SCREW CORT 3.5X15 ST EVOS  [employer]  Right 1 Implanted  SCREW LOCK 2.7X13 ST EVOS - [id number] Screw SCREW LOCK 2.7X13 ST EVOS  [employer]  Right 1 Implanted  SCREW CORT 2.7X14 T8 EVOS - [id number] Screw SCREW CORT 2.7X14 T8 EVOS  [employer]  Right 1 Implanted  SCREW CORT 2.7X15 T8 ST EVOS - [id number] Screw SCREW CORT 2.7X15 T8 ST EVOS  [employer]  Right 1 Implanted  SCREW CORT 2.7X16 STAR T8 EVOS - [id number] Screw SCREW CORT 2.7X16 STAR T8 EVOS  [employer]  Right 1 Implanted  SCREW CORT 2.7X16 STAR T8 EVOS - [id number] Screw SCREW CORT 2.7X16 STAR T8 EVOS  [employer]  Right 1 Implanted  SCREW EVOS 2.7X18 LOCK T8 - [id number] Screw SCREW EVOS 2.7X18 LOCK T8  [employer]  Right 1 Implanted  SCREW CTX 3.5X50MM EVOS - [id number] Screw SCREW CTX 3.5X50MM EVOS  [employer]  Right 1 Implanted  SCREW CORT ST EVOS 3.5X55 - [id number]  Screw SCREW CORT ST EVOS 3.5X55  [employer]  Right 1 Implanted  SCREW CORT ST EVOS 3.5X65 - [id number] Screw SCREW CORT ST EVOS 3.5X65  [employer]  Right 1 Implanted  PLATE FIB EVOS 3.4/7.4 7H R103 - [id number] Plate PLATE FIB EVOS 4.3/3.2 7H R103  [employer]  Right 1 Implanted     Indications for  Surgery: 89 year old female who sustained a ground-level fall with a right periprosthetic distal femur fracture and a right bimalleolar ankle fracture.  Due to the unstable nature of her injuries I recommend proceeding with open reduction internal fixation.  Risk and benefits were discussed with the patient and her son.  Risks include but not limited to bleeding, infection, malunion, nonunion, hardware failure, hardware irritation, nerve or blood vessel injury, DVT, even the possibility anesthetic complications.  They agreed to proceed with surgery and consent was obtained.  Operative Findings: 1.  Open reduction internal fixation of right supracondylar distal femur fracture using [employer] NCB distal femoral locking plate. 2.  Significantly distal fracture that had pulled the cancellous bone off of the lateral portion of the prosthesis treated with provisional stabilization using cement. 3.  Open reduction internal fixation of right bimalleolar ankle fracture using [employer] EVOS 3.5/2.56mm distal fibular locking plate with independent 3.5 millimeter screws for medial malleolus and syndesmotic fixation.  Procedure: The patient was identified in the preoperative holding area. Consent was confirmed with the patient and their family and all questions were answered. The operative extremity was marked after confirmation with the patient. she was then brought back to the operating room by our anesthesia colleagues.  She was placed under general anesthetic and carefully transferred over to radiolucent flattop table.  A bump was placed under her operative hip.  The right lower extremity was then prepped and draped in usual sterile fashion.  A timeout was performed to verify the patient, the procedure, and the extremity.  Preoperative antibiotics were dosed.  I for started out by fixing the distal femur.  The hip and knee were flexed over a triangle.  A lateral approach to the distal femur was carried  down through skin and subcutaneous tissue.  I incised through the IT band.  I exposed the lateral condyle and here I encountered significant injury to the cancellous bone on metaphysis.  The cancellous bone was pulled off of the prosthesis with no real intact cement mantle.  It appeared that the medial side was still intact.  Due to this finding I felt that provisionally stabilization using the cement to connect to the prosthesis and then drilling screws through that cement would provide decent fixation.  We then mixed cement and placed it in the metaphyseal defect getting good fixation to the prosthesis.  Once the cement was hardened I then placed a 12 hole [employer] NCB distal femoral locking plate and slid this submuscularly along the lateral cortex of the femur attached to the targeting arm.  I placed a K wire distally to hold the provisional portion position of the plate.  I then used a 3.3 mm drill bit to drill bicortically just distal to the femoral prosthesis.  I then drilled and placed 5.0 millimeter screws into the distal segment to bring the plate flush to bone.  I then drilled and placed 5.0 millimeter screws into the femoral shaft to correct the coronal alignment.  I was able to place a proximal screw bicortically around the femoral prosthesis.  Locking  caps were placed in the to center screws in the femoral shaft.  I returned to the distal segment and placed 3 more screws.  Locking caps were placed in all the distal screws.  Final fluoroscopic imaging was obtained.  The incisions were copiously irrigated.  A gram of vancomycin powder was placed into the incision.  Layered closure of 0 Vicryl, 2-0 Vicryl and 3-0 Monocryl was used to close the skin.  Dermabond was used to seal the skin.  We then turned our attention to the right ankle.  Fluoroscopic imaging showed the unstable nature of her injury.  Direct lateral approach to the distal fibula was carried down through skin and subcutaneous  tissue.  I took care to try to protect the superficial peroneal nerve.  I exposed the fracture site and reduced it and then provisionally placed a EVOS 3.5/2.7 mm distal fibula locking plate.  I placed nonlocking screw proximal and distal to the fracture to bring the plate flush to bone.  I then proceeded to place locking screws in the remainder of the holes.  Once I had the fibula fixed I turned my attention to the medial malleolus.  It was well aligned up and I felt that percutaneous fixation would be appropriate.  I then made percutaneous incisions drilled and placed a 3.5 millimeter screws obtaining bicortical fixation through the medial malleolus.  Because of her poor bone quality I decided to stress the ankle and place a syndesmotic screw across both the fibula and tibia.  Final fluoroscopic imaging was then obtained.  The incision was copiously irrigated.  A gram of vancomycin powder was placed into the incision.  A layered closure of 2-0 Vicryl and 3-0 nylon was used to close the skin.  Sterile dressings were applied.  The patient was then awoken from anesthesia and taken to the PACU in stable condition.   Post Op Plan/Instructions: The patient will be weightbearing for transfers.  I do not want her ambulating with a walker.  She will be restarted on her Eliquis once her hemoglobin is stabilized.  Will have her mobilize with physical and Occupational Therapy.  I was present and performed the entire surgery.  Patrecia Pace, PA-C did assist me throughout the case. An assistant was necessary given the difficulty in approach, maintenance of reduction and ability to instrument the fracture.   Katha Hamming, MD [facility]

## 2022-12-07 NOTE — Anesthesia Preprocedure Evaluation (Addendum)
Anesthesia Evaluation  Patient identified by MRN, date of birth, ID band Patient awake    Reviewed: Allergy & Precautions, NPO status , Patient's Chart, lab work & pertinent test results, reviewed documented beta blocker date and time   Airway Mallampati: II  TM Distance: >3 FB Neck ROM: Limited    Dental  (+) Chipped, Missing, Poor Dentition, Dental Advisory Given   Pulmonary COPD,  COPD inhaler Covid 19 +   breath sounds clear to auscultation + decreased breath sounds      Cardiovascular hypertension, Pt. on medications + dysrhythmias Atrial Fibrillation + pacemaker  Rhythm:Irregular Rate:Tachycardia  SA node dysfunction S/P PPP  EKG 12/06/22 Atrial fibrillation, anterior infarct, LVH with repol abnormality , inverted T wave V3-6  Echo 2018 Left ventricle: The cavity size was normal. Wall thickness was    increased in a pattern of mild LVH. Indeterminant diastolic    function (atrial fibrillation). Systolic function was normal. The    estimated ejection fraction was in the range of 55% to 60%. Wall    motion was normal; there were no regional wall motion    abnormalities.  - Aortic valve: There was no stenosis.  - Mitral valve: Mildly calcified annulus. There was trivial    regurgitation.  - Left atrium: The atrium was severely dilated.  - Right ventricle: The cavity size was normal. Pacer wire or    catheter noted in right ventricle. Systolic function was mildly    reduced.  - Tricuspid valve: Peak RV-RA gradient (S): 35 mm Hg.  - Pulmonary arteries: PA peak pressure: 43 mm Hg (S).  - Systemic veins: IVC measured 2.3 cm with > 50% respirophasic    variation, suggesting RA pressure 8 mmHg.     Neuro/Psych Macular degeneration  negative psych ROS   GI/Hepatic negative GI ROS, Neg liver ROS,,,  Endo/Other  Hyperlipidemia  Renal/GU negative Renal ROS  negative genitourinary   Musculoskeletal  (+) Arthritis ,  Osteoarthritis,  Right periprosthetic distal femur fracture  Bimalleolar Fx right ankle   Abdominal   Peds  Hematology  (+) Blood dyscrasia, anemia Eliquis therapy-last dose 1/24 Thrombocytopenia- Plt  79k   Anesthesia Other Findings   Reproductive/Obstetrics Hx/o endometrial Ca 2015 S/P uterine polypectomy                              Anesthesia Physical Anesthesia Plan  ASA: 3  Anesthesia Plan: General   Post-op Pain Management: Dilaudid IV and Ofirmev IV (intra-op)*   Induction: Intravenous  PONV Risk Score and Plan: 3 and Treatment may vary due to age or medical condition, Ondansetron and Dexamethasone  Airway Management Planned: Oral ETT  Additional Equipment: None  Intra-op Plan:   Post-operative Plan: Extubation in OR  Informed Consent: I have reviewed the patients History and Physical, chart, labs and discussed the procedure including the risks, benefits and alternatives for the proposed anesthesia with the patient or authorized representative who has indicated his/her understanding and acceptance.   Patient has DNR.  Discussed DNR with patient and Suspend DNR.   Dental advisory given  Plan Discussed with: CRNA and Anesthesiologist  Anesthesia Plan Comments:          Anesthesia Quick Evaluation

## 2022-12-07 NOTE — Anesthesia Procedure Notes (Signed)
Procedure Name: Intubation Date/Time: 12/07/2022 11:07 AM  Performed by: Elvin So, CRNAPre-anesthesia Checklist: Patient identified, Emergency Drugs available, Suction available and Patient being monitored Patient Re-evaluated:Patient Re-evaluated prior to induction Oxygen Delivery Method: Circle System Utilized Preoxygenation: Pre-oxygenation with 100% oxygen Induction Type: IV induction Ventilation: Mask ventilation without difficulty Laryngoscope Size: Mac and 3 Grade View: Grade I Tube type: Oral Tube size: 7.0 mm Number of attempts: 1 Airway Equipment and Method: Stylet and Oral airway Placement Confirmation: ETT inserted through vocal cords under direct vision, positive ETCO2 and breath sounds checked- equal and bilateral Secured at: 20 cm Tube secured with: Tape Dental Injury: Teeth and Oropharynx as per pre-operative assessment

## 2022-12-07 NOTE — Transfer of Care (Signed)
Immediate Anesthesia Transfer of Care Note  Patient: Romelia H Falor  Procedure(s) Performed: OPEN REDUCTION INTERNAL FIXATION (ORIF) ANKLE FRACTURE (Right: Ankle) OPEN REDUCTION INTERNAL FIXATION (ORIF) DISTAL FEMUR (Right: Leg Upper)  Patient Location: PACU  Anesthesia Type:General  Level of Consciousness: awake and responds to stimulation  Airway & Oxygen Therapy: Patient Spontanous Breathing and Patient connected to face mask oxygen  Post-op Assessment: Report given to RN, Post -op Vital signs reviewed and stable, and Patient moving all extremities  Post vital signs: Reviewed and stable  Last Vitals:  Vitals Value Taken Time  BP 123/74 12/07/22 1304  Temp 36.7 C 12/07/22 1304  Pulse 74 12/07/22 1308  Resp 17 12/07/22 1308  SpO2 100 % 12/07/22 1308  Vitals shown include unvalidated device data.  Last Pain:  Vitals:   12/07/22 0516  TempSrc:   PainSc: Asleep         Complications: No notable events documented.

## 2022-12-08 DIAGNOSIS — S72001A Fracture of unspecified part of neck of right femur, initial encounter for closed fracture: Secondary | ICD-10-CM | POA: Diagnosis not present

## 2022-12-08 DIAGNOSIS — S82841A Displaced bimalleolar fracture of right lower leg, initial encounter for closed fracture: Secondary | ICD-10-CM | POA: Diagnosis not present

## 2022-12-08 LAB — BASIC METABOLIC PANEL
Anion gap: 7 (ref 5–15)
BUN: 23 mg/dL (ref 8–23)
CO2: 24 mmol/L (ref 22–32)
Calcium: 7.3 mg/dL — ABNORMAL LOW (ref 8.9–10.3)
Chloride: 99 mmol/L (ref 98–111)
Creatinine, Ser: 0.86 mg/dL (ref 0.44–1.00)
GFR, Estimated: >60 mL/min (ref >60)
Glucose, Bld: 138 mg/dL — ABNORMAL HIGH (ref 70–99)
Potassium: 4 mmol/L (ref 3.5–5.1)
Sodium: 130 mmol/L — ABNORMAL LOW (ref 135–145)

## 2022-12-08 LAB — URINE CULTURE

## 2022-12-08 LAB — CBC
HCT: 25.3 % — ABNORMAL LOW (ref 36.0–46.0)
Hemoglobin: 8.1 g/dL — ABNORMAL LOW (ref 12.0–15.0)
MCH: 34.6 pg — ABNORMAL HIGH (ref 26.0–34.0)
MCHC: 32 g/dL (ref 30.0–36.0)
MCV: 108.1 fL — ABNORMAL HIGH (ref 80.0–100.0)
Platelets: 125 10*3/uL — ABNORMAL LOW (ref 150–400)
RBC: 2.34 MIL/uL — ABNORMAL LOW (ref 3.87–5.11)
RDW: 13.4 % (ref 11.5–15.5)
WBC: 8 10*3/uL (ref 4.0–10.5)
nRBC: 0 % (ref 0.0–0.2)

## 2022-12-08 LAB — MISC LABCORP TEST (SEND OUT): Labcorp test code: 81950

## 2022-12-08 LAB — C-REACTIVE PROTEIN: CRP: 5.5 mg/dL — ABNORMAL HIGH (ref <1.0)

## 2022-12-08 MED ORDER — PREDNISONE 20 MG PO TABS: 50.0000 mg | ORAL_TABLET | Freq: Every day | ORAL | Status: DC

## 2022-12-08 MED ORDER — METHYLPREDNISOLONE SODIUM SUCC 125 MG IJ SOLR
120.0000 mg | Freq: Every day | INTRAMUSCULAR | Status: DC
  Administered 2022-12-08: 120 mg via INTRAVENOUS
  Filled 2022-12-08: qty 2

## 2022-12-08 NOTE — Progress Notes (Signed)
     Subjective:  Patient reports pain as mild.  Lying comfortably in bed.  Denies distal numbness and tingling.  No acute issues.  Objective:   VITALS:   Vitals:   12/07/22 1412 12/07/22 2020 12/07/22 2337 12/08/22 0520  BP: 114/65 (!) 85/65    Pulse: 84     Resp: 16     Temp: 97.9 F (36.6 C) 97.8 F (36.6 C) 97.9 F (36.6 C) 98.4 F (36.9 C)  TempSrc: Oral Oral Oral Oral  SpO2: 100%     Weight:      Height:        Sensation intact distally Intact pulses distally Dorsiflexion/Plantar flexion intact Incision: dressing C/D/I Compartment soft   Lab Results  Component Value Date   WBC 8.0 12/08/2022   HGB 8.1 (L) 12/08/2022   HCT 25.3 (L) 12/08/2022   MCV 108.1 (H) 12/08/2022   PLT 125 (L) 12/08/2022   BMET    Component Value Date/Time   NA 130 (L) 12/08/2022 0155   K 4.0 12/08/2022 0155   CL 99 12/08/2022 0155   CO2 24 12/08/2022 0155   GLUCOSE 138 (H) 12/08/2022 0155   BUN 23 12/08/2022 0155   CREATININE 0.86 12/08/2022 0155   CREATININE 0.56 (L) 09/12/2022 1429   CALCIUM 7.3 (L) 12/08/2022 0155   EGFR 83 09/12/2022 1429   GFRNONAA >60 12/08/2022 0155   GFRNONAA 75 05/03/2021 1317    Assessment/Plan: 1 Day Post-Op   Principal Problem:   Fall Active Problems:   COVID-19  S/p R distal femur and ankle ORIF 1/26 by Dr. Doreatha Martin  Post op recs: WB: WBAT for transfers only Abx: ancef x23 hours post op Dressing: keep intact, change PRN if soiled or saturated. DVT prophylaxis: Plan to resume Eliquis once hemoglobin is stabilized.,  Hemoglobin down to 8.1, continue to hold, resume possibly tomorrow if stable.   Ziah Leandro A Secilia Apps 12/08/2022, 8:30 AM   Charlies Constable, MD  Contact information:   [phone] 7am-5pm epic message Dr. Zachery Dakins, or call office for patient follow up: [phone] After hours and holidays please check [email or web address] for group call information for [facility]

## 2022-12-08 NOTE — Progress Notes (Signed)
  Progress Note   Patient: [name] RCV:[id number] DOB: 04/04/1923 DOA: 12/06/2022     2 DOS: the patient was seen and examined on 12/08/2022   Brief hospital course: 89 y.o. female with medical history significant of  Paroxysmal atrial fibrillation, copd, hypertension, lives by herself at home , was brought in for a fall. Pt was found to have periprosthetic fracture of the distal femoral metaphysis and minimally angulated fracture of distal fibula and displaced fracture of medial malleolus. Pt found to be COVID pos  Assessment and Plan: Mechanical fall with Periprosthetic fracture of the distal femoral metaphysis and angulated fracture of the distal fibular dia metaphysis: Orthopedic Surgery consulted, now s/p surgery 12/07/22 Therapy recs for SNF noted. TOC consulted    Right medial malleolus fracture;  Orthopedic surgery following Cont with analgesia as needed    COVID positive infection;  She remains asymptomatic and she is not hypoxic.  Cont to follow inflammatory markers CRP from 1.7 to 5.5 post-op One episode of fever noted 1/26 Given one dose of IV steroids this AM Recheck inflammatory markers in AM  Paroxysmal atrial fibrillation on eliquis, which is on hold.    Hypertension:  BP parameters are optimal.     Elevated lactic acid possibly from dehydration and COVID infection.  Normalized after hydration      Subjective: No complaints this AM  Physical Exam: Vitals:   12/07/22 2337 12/08/22 0520 12/08/22 0710 12/08/22 0815  BP:    (!) 97/56  Pulse:    73  Resp:    18  Temp: 97.9 F (36.6 C) 98.4 F (36.9 C)  97.9 F (36.6 C)  TempSrc: Oral Oral  Oral  SpO2:   100%   Weight:      Height:       General exam: Conversant, in no acute distress Respiratory system: normal chest rise, clear, no audible wheezing Cardiovascular system: regular rhythm, s1-s2 Gastrointestinal system: Nondistended, nontender, pos BS Central nervous system: No seizures, no  tremors Extremities: No cyanosis, no joint deformities Skin: No rashes, no pallor Psychiatry: Affect normal // no auditory hallucinations   Data Reviewed:  Labs reviewed: Na 130, K 4.0, Cr 0.86, CRP 5.5, WBC 8.0, Hgb 8.1  Family Communication: Pt in room, family not at bedside  Disposition: Status is: Inpatient Remains inpatient appropriate because: Severity of illness  Planned Discharge Destination:  [facility]    Author: Marylu Lund, MD 12/08/2022 3:25 PM  For on call review [email or web address].

## 2022-12-08 NOTE — Progress Notes (Signed)
Orthopedic Tech Progress Note Patient Details:  [name] [name] 1923/10/24 [id number]  Ortho Devices Type of Ortho Device: CAM walker Ortho Device/Splint Location: RLE Ortho Device/Splint Interventions: Application   Post Interventions Patient Tolerated: Well Instructions Provided: Care of device  [name] E Taelor Waymire 12/08/2022, 9:13 AM

## 2022-12-08 NOTE — Evaluation (Signed)
Physical Therapy Evaluation Patient Details Name: [name] MRN: [id number] DOB: 04-30-1923 Today's Date: 12/08/2022  History of Present Illness  Pt is a 89 yo female who presents on 12/06/22 after falling at home and sustaining a R distal periprosthetic femur fx and displaced bimalleolar fx of the R ankle. Pt also found to be covid +, asymptomatic. Underwent ORIF R ankle  and R femur on 1/26.  PMH:  Paroxysmal atrial fibrillation, copd, hypertension. Lives alone.  Clinical Impression  Pt admitted with above diagnosis. Pt received in bed, IV out (RN notified) and no CAM boot. Called ortho tech who came and placed it promptly. Pt currently needing max to tot A for all mobility. Having difficulty maintaining balance in sitting due to L and posterior lean away from hurt hip. Pt having difficulty using LUE. Unclear whether this was an issue at baseline. She seems to have STM deficits but assessment of this complicated by her being quite [facility] and [place] needing to have proper PPE for covid. Did not attempt transfer this date but working on sitting balance EOB and scooting. Rec SNF at d/c as pt is from home alone.  Pt currently with functional limitations due to the deficits listed below (see PT Problem List). Pt will benefit from skilled PT to increase their independence and safety with mobility to allow discharge to the venue listed below.          Recommendations for follow up therapy are one component of a multi-disciplinary discharge planning process, led by the attending physician.  Recommendations may be updated based on patient status, additional functional criteria and insurance authorization.  Follow Up Recommendations Skilled nursing-short term rehab (<3 hours/day) Can patient physically be transported by private vehicle: No    Assistance Recommended at Discharge Frequent or constant Supervision/Assistance  Patient can return home with the following  Two people to help with walking and/or  transfers;Two people to help with bathing/dressing/bathroom;Assistance with feeding;Assistance with cooking/housework;Assist for transportation;Help with stairs or ramp for entrance;Direct supervision/assist for medications management;Direct supervision/assist for financial management    Equipment Recommendations Wheelchair (measurements PT)  Recommendations for Other Services  OT consult    Functional Status Assessment Patient has had a recent decline in their functional status and demonstrates the ability to make significant improvements in function in a reasonable and predictable amount of time.     Precautions / Restrictions Precautions Precautions: Fall Required Braces or Orthoses: Other Brace Other Brace: CAM Boot R Restrictions Weight Bearing Restrictions: Yes RLE Weight Bearing: Weight bearing as tolerated Other Position/Activity Restrictions: transfers only      Mobility  Bed Mobility Overal bed mobility: Needs Assistance Bed Mobility: Sidelying to Sit, Supine to Sit   Sidelying to sit: Max assist Supine to sit: Total assist     General bed mobility comments: pt able to assist with bed mobility only by moving LLE, having difficulty using UE's to assist with bed mobility, esp the L. Not sure if this is baseline or not and pt cuold not really tell me. Max A to come to sitting EOB. Tot A for return to supine and scoot up to [place] transfer comment: not yet tested    Ambulation/Gait               General Gait Details: no ambulation per MD orders  Stairs  Wheelchair Mobility    Modified Rankin (Stroke Patients Only)       Balance Overall balance assessment: Needs assistance Sitting-balance support: Feet supported, Single extremity supported Sitting balance-Leahy Scale: Poor Sitting balance - Comments: L and posterior lean. Pt needed consistent min A in sitting to not fall bkwds. Attempted to  let her eat some breakfast in sitting but she could not hold her balance well enough to do so Postural control: Left lateral lean, Posterior lean     Standing balance comment: N/A                             Pertinent Vitals/Pain Pain Assessment Pain Assessment: Faces Faces Pain Scale: Hurts whole lot Pain Location: R hip and R ankle Pain Descriptors / Indicators: Grimacing, Guarding, Sore Pain Intervention(s): Limited activity within patient's tolerance, Monitored during session    Home Living Family/patient expects to be discharged to:: Private residence Living Arrangements: Alone Available Help at Discharge: Family;Available PRN/intermittently Type of Home: House Home Access: Stairs to enter Entrance Stairs-Rails: Right Entrance Stairs-Number of Steps: 3   Home Layout: One level Home Equipment: Advice worker (2 wheels);Cane - single point;BSC/3in1      Prior Function Prior Level of Function : Independent/Modified Independent;Driving             Mobility Comments: pt reports she drove and did what she wanted but that she doesn't cook much anymore. She thinks she fell when she was going out to get the paper. Question her history       Hand Dominance   Dominant Hand: Right    Extremity/Trunk Assessment   Upper Extremity Assessment Upper Extremity Assessment: Defer to OT evaluation;LUE deficits/detail LUE Deficits / Details: has some difficulty using LUE, OT to assess further    Lower Extremity Assessment Lower Extremity Assessment: Generalized weakness;RLE deficits/detail;LLE deficits/detail RLE Deficits / Details: hip flex 1/5, knee ext 1/5. CAM boot not in room so called ortho tech and they brought it and fit it promptly. Can wiggle R toes. R hip tends to turn in with CAM boot on, used pillows end of session to keep foot/ hip/ knee in alignment RLE: Unable to fully assess due to pain;Unable to fully assess due to immobilization RLE  Sensation: WNL RLE Coordination: decreased gross motor LLE Deficits / Details: grossly 3+/5 LLE Sensation: WNL LLE Coordination: WNL    Cervical / Trunk Assessment Cervical / Trunk Assessment: Kyphotic  Communication   Communication: HOH  Cognition Arousal/Alertness: Awake/alert Behavior During Therapy: WFL for tasks assessed/performed Overall Cognitive Status: No family/caregiver present to determine baseline cognitive functioning                                 General Comments: Seems to have some STM deficits. May be baseline but also difficult to assess correctly because she is [facility] and providers having to wear N95 and shield due to her covid        General Comments General comments (skin integrity, edema, etc.): HR 82bpm, 98% SPO2 on RA, BP 98/56. When PT entered room pt with blood on R side gown and IV dripping, noted was not in her arm at all. Notified RN and paused pump. Pt gown changed and RN present after session.    Exercises     Assessment/Plan    PT Assessment Patient needs continued PT services  PT Problem List  Decreased strength;Decreased range of motion;Decreased activity tolerance;Decreased balance;Decreased mobility;Decreased coordination;Decreased cognition;Decreased knowledge of use of DME;Decreased safety awareness;Decreased knowledge of precautions;Pain       PT Treatment Interventions DME instruction;Functional mobility training;Therapeutic activities;Therapeutic exercise;Balance training;Patient/family education;Neuromuscular re-education    PT Goals (Current goals can be found in the Care Plan section)  Acute Rehab PT Goals Patient Stated Goal: less pain PT Goal Formulation: With patient Time For Goal Achievement: 12/22/22 Potential to Achieve Goals: Good    Frequency Min 3X/week     Co-evaluation               AM-PAC PT "6 Clicks" Mobility  Outcome Measure Help needed turning from your back to your side while in a flat bed  without using bedrails?: Total Help needed moving from lying on your back to sitting on the side of a flat bed without using bedrails?: Total Help needed moving to and from a bed to a chair (including a wheelchair)?: Total Help needed standing up from a chair using your arms (e.g., wheelchair or bedside chair)?: Total Help needed to walk in hospital room?: Total Help needed climbing 3-5 steps with a railing? : Total 6 Click Score: 6    End of Session   Activity Tolerance: Patient tolerated treatment well Patient left: in bed;with call bell/phone within reach;with bed alarm set;with nursing/sitter in room Nurse Communication: Mobility status PT Visit Diagnosis: Unsteadiness on feet (R26.81);Muscle weakness (generalized) (M62.81);History of falling (Z91.81);Difficulty in walking, not elsewhere classified (R26.2);Pain Pain - Right/Left: Right Pain - part of body: Hip;Ankle and joints of foot    Time: 0827-0911 PT Time Calculation (min) (ACUTE ONLY): 44 min   Charges:   PT Evaluation $PT Eval Moderate Complexity: 1 Mod PT Treatments $Therapeutic Activity: 23-37 mins        Lyanne Co, PT  Acute Rehab Services Secure chat preferred Office [phone]   Lawana Chambers Gwendolyn Nishi 12/08/2022, 10:36 AM

## 2022-12-08 NOTE — Evaluation (Signed)
Occupational Therapy Evaluation Patient Details Name: [name] MRN: [id number] DOB: Feb 18, 1923 Today's Date: 12/08/2022   History of Present Illness Pt is a 89 yo female who presents on 12/06/22 after falling at home and sustaining a R distal periprosthetic femur fx and displaced bimalleolar fx of the R ankle. Pt also found to be covid +, asymptomatic. Underwent ORIF R ankle  and R femur on 1/26.  PMH:  Paroxysmal atrial fibrillation, copd, hypertension. Lives alone.   Clinical Impression   [name] was evaluated s/p the above admission list, she is typically mod I at baseline with use of RW intermittently. Upon evaluation pt was limited by pain with any movement of RLE, impaired balance, generalized weakness, and decreased activity tolerance. Overall she is dependent for all mobility at bed level and LB ADLs. She needs max A for UB ADLs due to poor sitting tolerance, as well as min A for feeding and grooming tasks. OT to continue to follow. Recommend d/c to SNF.      Recommendations for follow up therapy are one component of a multi-disciplinary discharge planning process, led by the attending physician.  Recommendations may be updated based on patient status, additional functional criteria and insurance authorization.   Follow Up Recommendations  Skilled nursing-short term rehab (<3 hours/day)     Assistance Recommended at Discharge Frequent or constant Supervision/Assistance  Patient can return home with the following A lot of help with walking and/or transfers;Two people to help with walking and/or transfers;A lot of help with bathing/dressing/bathroom;Two people to help with bathing/dressing/bathroom;Assistance with cooking/housework;Assistance with feeding;Direct supervision/assist for medications management;Direct supervision/assist for financial management;Assist for transportation;Help with stairs or ramp for entrance    Functional Status Assessment  Patient has had a recent  decline in their functional status and demonstrates the ability to make significant improvements in function in a reasonable and predictable amount of time.  Equipment Recommendations  Other (comment) (defer)    Recommendations for Other Services       Precautions / Restrictions Precautions Precautions: Fall Required Braces or Orthoses: Other Brace Other Brace: CAM Boot R Restrictions Weight Bearing Restrictions: Yes RLE Weight Bearing: Weight bearing as tolerated Other Position/Activity Restrictions: transfers only      Mobility Bed Mobility Overal bed mobility: Needs Assistance Bed Mobility: Sidelying to Sit, Supine to Sit   Sidelying to sit: Total assist Supine to sit: Total assist     General bed mobility comments: pt able to assist with bed mobility only by moving LLE, having difficulty using UE's to assist with bed mobility, esp the L. Not sure if this is baseline or not and pt cuold not really tell me. Max A to come to sitting EOB. Tot A for return to supine and scoot up to [place] transfer comment: deferred      Balance Overall balance assessment: Needs assistance Sitting-balance support: Feet supported, Single extremity supported Sitting balance-Leahy Scale: Poor Sitting balance - Comments: L and posterior lean. Pt needed consistent min A in sitting to not fall bkwds. Attempted to let her eat some breakfast in sitting but she could not hold her balance well enough to do so Postural control: Left lateral lean, Posterior lean                         ADL either performed or assessed with clinical judgement  ADL Overall ADL's : Needs assistance/impaired Eating/Feeding: Set up;Minimal assistance;Sitting   Grooming: Minimal assistance;Sitting   Upper Body Bathing: Minimal assistance;Bed level   Lower Body Bathing: Total assistance   Upper Body Dressing : Maximal assistance;Bed level   Lower Body Dressing:  Total assistance   Toilet Transfer: Total assistance   Toileting- Clothing Manipulation and Hygiene: Total assistance       Functional mobility during ADLs: Total assistance General ADL Comments: limited by RLE pain with any movement     Vision Baseline Vision/History: 1 Wears glasses Vision Assessment?: Vision impaired- to be further tested in functional context Additional Comments: undershooting when attempting to obtain items on tray table     Perception Perception Perception Tested?: No   Praxis Praxis Praxis tested?: Not tested    Pertinent Vitals/Pain Pain Assessment Pain Assessment: Faces Faces Pain Scale: Hurts whole lot Pain Location: R hip and R ankle wtih movement. 0 at rest Pain Descriptors / Indicators: Grimacing, Guarding, Sore Pain Intervention(s): Limited activity within patient's tolerance, Monitored during session     Hand Dominance Right   Extremity/Trunk Assessment Upper Extremity Assessment Upper Extremity Assessment: RUE deficits/detail;LUE deficits/detail RUE Deficits / Details: impaired FM coordination, mild tremor during purposeful movement. increased time and effort needed for self feeding wtih spoon LUE Deficits / Details: using as functional assist with feeding task, tremor noted. slowed sensory motor coordination   Lower Extremity Assessment Lower Extremity Assessment: Defer to PT evaluation   Cervical / Trunk Assessment Cervical / Trunk Assessment: Kyphotic   Communication Communication Communication: HOH   Cognition Arousal/Alertness: Awake/alert Behavior During Therapy: WFL for tasks assessed/performed Overall Cognitive Status: No family/caregiver present to determine baseline cognitive functioning                                 General Comments: Seems to have some STM deficits. May be baseline but also difficult to assess correctly because she is [facility] and providers having to wear N95 and shield due to her covid      General Comments  BP soft but stable MAP >60     Home Living Family/patient expects to be discharged to:: Private residence Living Arrangements: Alone Available Help at Discharge: Family;Available PRN/intermittently Type of Home: House Home Access: Stairs to enter Entrance Stairs-Number of Steps: 3 Entrance Stairs-Rails: Right Home Layout: One level     Bathroom Shower/Tub: Occupational psychologist: Handicapped height     Home Equipment: Advice worker (2 wheels);Cane - single point;BSC/3in1          Prior Functioning/Environment Prior Level of Function : Independent/Modified Independent;Driving             Mobility Comments: pt reports she drove and did what she wanted but that she doesn't cook much anymore. ADLs Comments: reports her step children manage her medications        OT Problem List: Decreased strength;Decreased activity tolerance;Decreased range of motion;Impaired balance (sitting and/or standing);Decreased knowledge of use of DME or AE;Decreased safety awareness;Decreased knowledge of precautions      OT Treatment/Interventions: Self-care/ADL training;Therapeutic exercise;DME and/or AE instruction;Therapeutic activities;Patient/family education;Balance training    OT Goals(Current goals can be found in the care plan section) Acute Rehab OT Goals Patient Stated Goal: to go home OT Goal Formulation: With patient Time For Goal Achievement: 12/22/22 Potential to Achieve Goals: Good ADL Goals Pt Will Perform Grooming: with set-up;sitting Pt Will Perform Lower Body Dressing: with  max assist;sit to/from stand Pt Will Transfer to Toilet: with max assist;stand pivot transfer;bedside commode Additional ADL Goal #1: pt will complete bed mobility with mod A as a precursor to ADLs Additional ADL Goal #2: Pt will tolerate sitting EOB with good balance for 5 minutes during functional task  OT Frequency: Min 2X/week       AM-PAC OT "6  Clicks" Daily Activity     Outcome Measure Help from another person eating meals?: A Little Help from another person taking care of personal grooming?: A Little Help from another person toileting, which includes using toliet, bedpan, or urinal?: Total Help from another person bathing (including washing, rinsing, drying)?: A Lot Help from another person to put on and taking off regular upper body clothing?: A Lot Help from another person to put on and taking off regular lower body clothing?: Total 6 Click Score: 12   End of Session Nurse Communication: Mobility status  Activity Tolerance: Patient limited by pain Patient left: in bed;with call bell/phone within reach;with bed alarm set  OT Visit Diagnosis: Unsteadiness on feet (R26.81);Other abnormalities of gait and mobility (R26.89);Muscle weakness (generalized) (M62.81);Pain                Time: 4098-1191 OT Time Calculation (min): 17 min Charges:  OT General Charges $OT Visit: 1 Visit OT Evaluation $OT Eval Moderate Complexity: 1 Mod   Dotty Gonzalo D Causey 12/08/2022, 3:43 PM

## 2022-12-09 DIAGNOSIS — S82841A Displaced bimalleolar fracture of right lower leg, initial encounter for closed fracture: Secondary | ICD-10-CM | POA: Diagnosis not present

## 2022-12-09 DIAGNOSIS — S72001A Fracture of unspecified part of neck of right femur, initial encounter for closed fracture: Secondary | ICD-10-CM | POA: Diagnosis not present

## 2022-12-09 LAB — COMPREHENSIVE METABOLIC PANEL
ALT: 9 U/L (ref 0–44)
AST: 37 U/L (ref 15–41)
Albumin: 2.3 g/dL — ABNORMAL LOW (ref 3.5–5.0)
Alkaline Phosphatase: 41 U/L (ref 38–126)
Anion gap: 8 (ref 5–15)
BUN: 33 mg/dL — ABNORMAL HIGH (ref 8–23)
CO2: 26 mmol/L (ref 22–32)
Calcium: 7.7 mg/dL — ABNORMAL LOW (ref 8.9–10.3)
Chloride: 99 mmol/L (ref 98–111)
Creatinine, Ser: 0.87 mg/dL (ref 0.44–1.00)
GFR, Estimated: >60 mL/min (ref >60)
Glucose, Bld: 157 mg/dL — ABNORMAL HIGH (ref 70–99)
Potassium: 4.4 mmol/L (ref 3.5–5.1)
Sodium: 133 mmol/L — ABNORMAL LOW (ref 135–145)
Total Bilirubin: 0.4 mg/dL (ref 0.3–1.2)
Total Protein: 5 g/dL — ABNORMAL LOW (ref 6.5–8.1)

## 2022-12-09 LAB — CBC
HCT: 22.7 % — ABNORMAL LOW (ref 36.0–46.0)
Hemoglobin: 7.2 g/dL — ABNORMAL LOW (ref 12.0–15.0)
MCH: 33.3 pg (ref 26.0–34.0)
MCHC: 31.7 g/dL (ref 30.0–36.0)
MCV: 105.1 fL — ABNORMAL HIGH (ref 80.0–100.0)
Platelets: 146 10*3/uL — ABNORMAL LOW (ref 150–400)
RBC: 2.16 MIL/uL — ABNORMAL LOW (ref 3.87–5.11)
RDW: 13.4 % (ref 11.5–15.5)
WBC: 9.8 10*3/uL (ref 4.0–10.5)
nRBC: 0 % (ref 0.0–0.2)

## 2022-12-09 LAB — C-REACTIVE PROTEIN: CRP: 6.6 mg/dL — ABNORMAL HIGH (ref <1.0)

## 2022-12-09 NOTE — [place] FL2 (Signed)
[place] MEDICAID FL2 LEVEL OF CARE FORM     IDENTIFICATION  Patient Name: LASHANDA STORLIE Birthdate: 1923/01/20 Sex: female Admission Date (Current Location): 12/06/2022  [facility] and [place] Number:  Herbalist and Address:  [facility] [place], [place] [address], [place], [place] [address]      Provider Number:    Attending Physician Name and Address:  Donne Hazel, MD  Relative Name and Phone Number:  Tamella Tuccillo [phone]    Current Level of Care: SNF Recommended Level of Care: [place] Prior Approval Number:    Date Approved/Denied:   PASRR Number: [id number]  Discharge Plan: SNF    Current Diagnoses: Patient Active Problem List   Diagnosis Date Noted   COVID-19 12/07/2022   Posterior vitreous detachment of both eyes 04/27/2021   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 macular degeneration of right eye with subfoveal involvement 04/27/2020   Right epiretinal membrane 04/27/2020   Pseudophakia 04/27/2020   Intermediate 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 04/27/2020   FTT (failure to thrive) in adult    Hematoma of scalp    Wheezing    Acute blood loss anemia 11/13/2016   Syncope and collapse 11/13/2016   Fall 11/12/2016   Annual physical exam 12/20/2014   Hip fracture ([place]) 05/25/2014   H/O bilateral salpingo-oophorectomy 12/11/2013   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   Sinoatrial node dysfunction ([place])    Encounter for long-term (current) use of anticoagulants 04/30/2011   Cardiac pacemaker 04/13/2009   Essential hypertension 04/12/2009   COPD (chronic obstructive pulmonary disease) ([place]) 04/12/2009   Arthropathy 04/12/2009    Orientation RESPIRATION BLADDER Height & Weight     Self, Time, Situation, Place  Normal Continent Weight: 134 lb (60.8 kg) Height:  5\' 5"  (165.1 cm)  BEHAVIORAL SYMPTOMS/MOOD NEUROLOGICAL BOWEL NUTRITION STATUS      Continent Diet  AMBULATORY STATUS  COMMUNICATION OF NEEDS Skin   Extensive Assist Verbally Normal                       Personal Care Assistance Level of Assistance  Bathing, Feeding, Dressing Bathing Assistance: Maximum assistance Feeding assistance: Independent Dressing Assistance: Maximum assistance     Functional Limitations Info  Sight, Hearing Sight Info: Adequate Hearing Info: Adequate      SPECIAL CARE FACTORS FREQUENCY  PT (By licensed PT), OT (By licensed OT)     PT Frequency: 5x per week OT Frequency: 5x per week            Contractures Contractures Info: Not present    Additional Factors Info  Code Status Code Status Info: DNR             Current Medications (12/09/2022):  This is the current hospital active medication list Current Facility-Administered Medications  Medication Dose Route Frequency Provider Last Rate Last Admin   acetaminophen (TYLENOL) tablet 650 mg  650 mg Oral Q4H PRN Corinne Ports, PA-C   650 mg at 12/09/22 3875   docusate sodium (COLACE) capsule 100 mg  100 mg Oral BID Corinne Ports, PA-C   100 mg at 12/09/22 6433   hydrALAZINE (APRESOLINE) tablet 25 mg  25 mg Oral Q8H PRN Corinne Ports, PA-C       metoCLOPramide (REGLAN) tablet 5-10 mg  5-10 mg Oral Q8H PRN McClung, Sarah A, PA-C       Or   metoCLOPramide (REGLAN) injection 5-10 mg  5-10 mg Intravenous Q8H PRN  Corinne Ports, PA-C       metoprolol tartrate (LOPRESSOR) tablet 25 mg  25 mg Oral BID Donne Hazel, MD   25 mg at 12/09/22 0841   morphine (PF) 2 MG/ML injection 1 mg  1 mg Intravenous Q4H PRN Corinne Ports, PA-C       ondansetron (ZOFRAN) tablet 4 mg  4 mg Oral Q6H PRN Corinne Ports, PA-C       Or   ondansetron (ZOFRAN) injection 4 mg  4 mg Intravenous Q6H PRN McClung, Sarah A, PA-C       polyethylene glycol (MIRALAX / GLYCOLAX) packet 17 g  17 g Oral Daily PRN McClung, Sarah A, PA-C       senna-docusate (Senokot-S) tablet 1 tablet  1 tablet Oral QHS PRN Corinne Ports, PA-C        traMADol (ULTRAM) tablet 50 mg  50 mg Oral Q8H PRN Corinne Ports, PA-C         Discharge Medications: Please see discharge summary for a list of discharge medications.  Relevant Imaging Results:  Relevant Lab Results:   Additional Information [id number]  Elliot Gurney [place],

## 2022-12-09 NOTE — Plan of Care (Signed)

## 2022-12-09 NOTE — Progress Notes (Signed)
     Subjective:  Patient reports pain well controlled. Sitting up comfortably in bed.  Denies distal numbness and tingling. Denies lightheadedness or dizziness.  No acute issues.    Objective:   VITALS:   Vitals:   12/08/22 1500 12/08/22 2156 12/09/22 0632 12/09/22 0751  BP: 90/74 (!) 102/55 138/73 138/89  Pulse: 78 66 65 62  Resp: 19 18 20 17   Temp: 98 F (36.7 C) 97.9 F (36.6 C) 98.2 F (36.8 C) 98 F (36.7 C)  TempSrc: Oral Oral Oral Oral  SpO2: 92% 94% 100% 98%  Weight:      Height:        Sensation intact distally Intact pulses distally Dorsiflexion/Plantar flexion intact Incision: dressing C/D/I Compartment soft   Lab Results  Component Value Date   WBC 9.8 12/09/2022   HGB 7.2 (L) 12/09/2022   HCT 22.7 (L) 12/09/2022   MCV 105.1 (H) 12/09/2022   PLT 146 (L) 12/09/2022   BMET    Component Value Date/Time   NA 133 (L) 12/09/2022 0325   K 4.4 12/09/2022 0325   CL 99 12/09/2022 0325   CO2 26 12/09/2022 0325   GLUCOSE 157 (H) 12/09/2022 0325   BUN 33 (H) 12/09/2022 0325   CREATININE 0.87 12/09/2022 0325   CREATININE 0.56 (L) 09/12/2022 1429   CALCIUM 7.7 (L) 12/09/2022 0325   EGFR 83 09/12/2022 1429   GFRNONAA >60 12/09/2022 0325   GFRNONAA 75 05/03/2021 1317    Assessment/Plan: 2 Days Post-Op   Principal Problem:   Fall Active Problems:   COVID-19  S/p R distal femur and ankle ORIF 1/26 by Dr. Doreatha Martin  Post op recs: WB: WBAT for transfers only Abx: ancef x23 hours post op Dressing: keep intact, change PRN if soiled or saturated. DVT prophylaxis: Plan to resume Eliquis once hemoglobin is stabilized.,  Hemoglobin down to 7.2, continue to hold, resume possibly tomorrow if stable.   Ventura Bruns 12/09/2022, 10:35 AM

## 2022-12-09 NOTE — Progress Notes (Signed)
  Progress Note   Patient: [name]:[id number] DOB: Mar 13, 1923 DOA: 12/06/2022     3 DOS: the patient was seen and examined on 12/09/2022   Brief hospital course: 89 y.o. female with medical history significant of  Paroxysmal atrial fibrillation, copd, hypertension, lives by herself at home , was brought in for a fall. Pt was found to have periprosthetic fracture of the distal femoral metaphysis and minimally angulated fracture of distal fibula and displaced fracture of medial malleolus. Pt found to be COVID pos  Assessment and Plan: Mechanical fall with Periprosthetic fracture of the distal femoral metaphysis and angulated fracture of the distal fibular dia metaphysis: Orthopedic Surgery consulted, now s/p surgery 12/07/22 Therapy recs for SNF noted. TOC following    Right medial malleolus fracture;  Orthopedic surgery following Cont with analgesia as needed    COVID positive infection;  She remains asymptomatic and she is not hypoxic.  Cont to follow inflammatory markers CRP from 1.7 to 6.6 today One episode of fever noted 1/26 Given one dose of IV steroids this visit Cont to follow inflammatory marker trends  Paroxysmal atrial fibrillation on eliquis, which is on hold.    Hypertension:  BP parameters are optimal.     Elevated lactic acid possibly from dehydration and COVID infection.  Normalized after hydration      Subjective: Without complaints this AM  Physical Exam: Vitals:   12/08/22 1500 12/08/22 2156 12/09/22 0632 12/09/22 0751  BP: 90/74 (!) 102/55 138/73 138/89  Pulse: 78 66 65 62  Resp: 19 18 20 17   Temp: 98 F (36.7 C) 97.9 F (36.6 C) 98.2 F (36.8 C) 98 F (36.7 C)  TempSrc: Oral Oral Oral Oral  SpO2: 92% 94% 100% 98%  Weight:      Height:       General exam: Awake, laying in bed, in nad Respiratory system: Normal respiratory effort, no wheezing Cardiovascular system: regular rate, s1, s2 Gastrointestinal system: Soft, nondistended,  positive BS Central nervous system: CN2-12 grossly intact, strength intact Extremities: Perfused, no clubbing Skin: Normal skin turgor, no notable skin lesions seen Psychiatry: Mood normal // no visual hallucinations   Data Reviewed:  Labs reviewed: Na 133, K 4.4, Cr 0.87, Hgb 7.2, WBC 9.8  Family Communication: Pt in room, family not at bedside  Disposition: Status is: Inpatient Remains inpatient appropriate because: Severity of illness  Planned Discharge Destination:  SNFl    Author: Marylu Lund, MD 12/09/2022 4:26 PM  For on call review [email or web address].

## 2022-12-09 NOTE — Plan of Care (Signed)
  Problem: Clinical Measurements: Goal: Respiratory complications will improve Outcome: Progressing   Problem: Activity: Goal: Risk for activity intolerance will decrease Outcome: Progressing   Problem: Nutrition: Goal: Adequate nutrition will be maintained Outcome: Progressing

## 2022-12-09 NOTE — TOC Initial Note (Addendum)
Transition of Care [facility]) - Initial/Assessment Note    Patient Details  Name: [name] MRN: [id number] Date of Birth: 12-11-1922  Transition of Care [facility]) CM/SW Contact:    Elliot Gurney [place], [place] Phone Number: 12/09/2022, 10:08 AM  Clinical Narrative:                 Phone call to patient's step-son to discuss recommendation for SNF. Patient currently lives alone, however per step-son, he visits twice daily to assists with meals and to assist with her daily needs. Patient has a walker in the home and is followed by [place]. Patient's step-son has no preference and is agreeable to short term rehab. Placement process discussed, Fl2 to be faxed out to local facilities. Bed offers to be reviewed with patient and step-son once received.  12:30pm Met with patient's step-son at bedside-provided Medicare.gov star rating list for review.  Alieu Finnigan, LCSW Transition of Care    Expected Discharge Plan: Skilled Nursing Facility Barriers to Discharge: Continued Medical Work up   Patient Goals and CMS Choice   CMS Medicare.gov Compare Post Acute Care list provided to:: Patient Represenative (must comment) (discussed by phone, list to be left in room) Choice offered to / list presented to : Adult Children (step-son)      Expected Discharge Plan and Services In-house Referral: Clinical Social Work   Post Acute Care Choice: [place] Living arrangements for the past 2 months: [place]                                      Prior Living Arrangements/Services Living arrangements for the past 2 months: [place] Lives with:: Self          Need for Family Participation in Patient Care: Yes (Comment) Care giver support system in place?: Yes (comment)   Criminal Activity/Legal Involvement Pertinent to Current Situation/Hospitalization: No - Comment as needed  Activities of Daily Living       Permission Sought/Granted                  Emotional Assessment         Alcohol / Substance Use: Not Applicable Psych Involvement: No (comment)  Admission diagnosis:  Fall [W19.XXXA] Closed fracture of right hip, initial encounter ([place]) [S72.001A] Closed bimalleolar fracture of right ankle, initial encounter [S82.841A] Other closed fracture of right femur, unspecified portion of femur, initial encounter ([place]) [S72.8X1A] COVID-19 [U07.1] Patient Active Problem List   Diagnosis Date Noted   COVID-19 12/07/2022   Posterior vitreous detachment of both eyes 04/27/2021   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 macular degeneration of right eye with subfoveal involvement 04/27/2020   Right epiretinal membrane 04/27/2020   Pseudophakia 04/27/2020   Intermediate 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 04/27/2020   FTT (failure to thrive) in adult    Hematoma of scalp    Wheezing    Acute blood loss anemia 11/13/2016   Syncope and collapse 11/13/2016   Fall 11/12/2016   Annual physical exam 12/20/2014   Hip fracture ([place]) 05/25/2014   H/O bilateral salpingo-oophorectomy 12/11/2013   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   Sinoatrial node dysfunction ([place])    Encounter for long-term (current) use of anticoagulants 04/30/2011   Cardiac pacemaker 04/13/2009   Essential hypertension 04/12/2009   COPD (chronic obstructive pulmonary disease) ([place]) 04/12/2009   Arthropathy 04/12/2009   PCP:  Dennard Schaumann,  Cammie Mcgee, MD Pharmacy:   [id number] [place], [place] [address] Phone: [phone] Fax: [phone]  [facility] ([place], [place] [address] [place] [address] [place] [address] Phone: [phone] Fax: [phone]     Social Determinants of Health (SDOH) Social History: SDOH Screenings   Depression (PHQ2-9): Low Risk   (09/12/2022)  Tobacco Use: Low Risk  (12/07/2022)   SDOH Interventions:     Readmission Risk Interventions     No data to display

## 2022-12-10 ENCOUNTER — Encounter (HOSPITAL_COMMUNITY): Payer: Self-pay | Admitting: Student

## 2022-12-10 DIAGNOSIS — S72001A Fracture of unspecified part of neck of right femur, initial encounter for closed fracture: Secondary | ICD-10-CM | POA: Diagnosis not present

## 2022-12-10 DIAGNOSIS — S82841A Displaced bimalleolar fracture of right lower leg, initial encounter for closed fracture: Secondary | ICD-10-CM | POA: Diagnosis not present

## 2022-12-10 LAB — CBC
HCT: 20 % — ABNORMAL LOW (ref 36.0–46.0)
Hemoglobin: 6.7 g/dL — CL (ref 12.0–15.0)
MCH: 34.7 pg — ABNORMAL HIGH (ref 26.0–34.0)
MCHC: 33.5 g/dL (ref 30.0–36.0)
MCV: 103.6 fL — ABNORMAL HIGH (ref 80.0–100.0)
Platelets: 157 10*3/uL (ref 150–400)
RBC: 1.93 MIL/uL — ABNORMAL LOW (ref 3.87–5.11)
RDW: 13.3 % (ref 11.5–15.5)
WBC: 7.5 10*3/uL (ref 4.0–10.5)
nRBC: 0 % (ref 0.0–0.2)

## 2022-12-10 LAB — COMPREHENSIVE METABOLIC PANEL
ALT: 10 U/L (ref 0–44)
AST: 36 U/L (ref 15–41)
Albumin: 2.2 g/dL — ABNORMAL LOW (ref 3.5–5.0)
Alkaline Phosphatase: 38 U/L (ref 38–126)
Anion gap: 12 (ref 5–15)
BUN: 30 mg/dL — ABNORMAL HIGH (ref 8–23)
CO2: 24 mmol/L (ref 22–32)
Calcium: 7.7 mg/dL — ABNORMAL LOW (ref 8.9–10.3)
Chloride: 97 mmol/L — ABNORMAL LOW (ref 98–111)
Creatinine, Ser: 0.72 mg/dL (ref 0.44–1.00)
GFR, Estimated: >60 mL/min (ref >60)
Glucose, Bld: 90 mg/dL (ref 70–99)
Potassium: 3.7 mmol/L (ref 3.5–5.1)
Sodium: 133 mmol/L — ABNORMAL LOW (ref 135–145)
Total Bilirubin: 0.5 mg/dL (ref 0.3–1.2)
Total Protein: 4.7 g/dL — ABNORMAL LOW (ref 6.5–8.1)

## 2022-12-10 LAB — C-REACTIVE PROTEIN: CRP: 3.3 mg/dL — ABNORMAL HIGH (ref <1.0)

## 2022-12-10 LAB — HEMOGLOBIN AND HEMATOCRIT, BLOOD
HCT: 23.9 % — ABNORMAL LOW (ref 36.0–46.0)
Hemoglobin: 8 g/dL — ABNORMAL LOW (ref 12.0–15.0)

## 2022-12-10 LAB — PREPARE RBC (CROSSMATCH)

## 2022-12-10 MED ORDER — VITAMIN D3 25 MCG PO TABS: 1000.0000 [IU] | ORAL_TABLET | Freq: Every day | ORAL | 0 refills | Status: DC

## 2022-12-10 MED ORDER — VITAMIN D3 25 MCG PO TABS: 1000.0000 [IU] | ORAL_TABLET | Freq: Every day | ORAL | 2 refills | Status: AC

## 2022-12-10 MED ORDER — ACETAMINOPHEN 325 MG PO TABS: 650.0000 mg | ORAL_TABLET | ORAL | Status: DC | PRN

## 2022-12-10 MED ORDER — ACETAMINOPHEN 325 MG PO TABS: 650.0000 mg | ORAL_TABLET | ORAL | 0 refills | Status: DC | PRN

## 2022-12-10 MED ORDER — VITAMIN D 25 MCG (1000 UNIT) PO TABS
1000.0000 [IU] | ORAL_TABLET | Freq: Every day | ORAL | Status: DC
  Administered 2022-12-10 – 2022-12-11 (×2): 1000 [IU] via ORAL
  Filled 2022-12-10 (×2): qty 1

## 2022-12-10 MED ORDER — SODIUM CHLORIDE 0.9% IV SOLUTION: Freq: Once | INTRAVENOUS | Status: AC

## 2022-12-10 NOTE — Plan of Care (Signed)
  Problem: Activity: °Goal: Risk for activity intolerance will decrease °Outcome: Progressing °  °Problem: Nutrition: °Goal: Adequate nutrition will be maintained °Outcome: Progressing °  °Problem: Coping: °Goal: Level of anxiety will decrease °Outcome: Progressing °  °

## 2022-12-10 NOTE — TOC CAGE-AID Note (Signed)
Transition of Care [facility]) - CAGE-AID Screening   Patient Details  Name: [name] MRN: [id number] Date of Birth: 1923/04/20  Transition of Care (TOC) CM/SW Contact:    Army Melia, RN Phone Number:[phone] 12/10/2022, 2:54 AM   CAGE-AID Screening:    Have You Ever Felt You Ought to Cut Down on Your Drinking or Drug Use?: No Have People Annoyed You By Critizing Your Drinking Or Drug Use?: No Have You Felt Bad Or Guilty About Your Drinking Or Drug Use?: No Have You Ever Had a Drink or Used Drugs First Thing In The Morning to Steady Your Nerves or to Get Rid of a Hangover?: No CAGE-AID Score: 0  Substance Abuse Education Offered: No (no hx of drug/alcohol use)

## 2022-12-10 NOTE — Discharge Instructions (Signed)
Orthopaedic Trauma Service Discharge Instructions   General Discharge Instructions  WEIGHT BEARING STATUS:Weightbearing as tolerated for transfers only in CAM boot. Do not want any walker ambulation on right lower extremity  RANGE OF MOTION/ACTIVITY: Ok for knee and ankle range of motion as tolerated  Wound Care: You may remove your surgical dressing. Incisions can be left open to air if there is no drainage. Once the incision is completely dry and without drainage, it may be left open to air out.  Showering may begin starting post-op day #3.  Clean incision gently with soap and water.  DVT/PE prophylaxis:  Eliquis  Diet: as you were eating previously.  Can use over the counter stool softeners and bowel preparations, such as Miralax, to help with bowel movements.  Narcotics can be constipating.  Be sure to drink plenty of fluids  PAIN MEDICATION USE AND EXPECTATIONS  You have likely been given narcotic medications to help control your pain.  After a traumatic event that results in an fracture (broken bone) with or without surgery, it is ok to use narcotic pain medications to help control one's pain.  We understand that everyone responds to pain differently and each individual patient will be evaluated on a regular basis for the continued need for narcotic medications. Ideally, narcotic medication use should last no more than 6-8 weeks (coinciding with fracture healing).   As a patient it is your responsibility as well to monitor narcotic medication use and report the amount and frequency you use these medications when you come to your office visit.   We would also advise that if you are using narcotic medications, you should take a dose prior to therapy to maximize you participation.  IF YOU ARE ON NARCOTIC MEDICATIONS IT IS NOT PERMISSIBLE TO OPERATE A MOTOR VEHICLE (MOTORCYCLE/CAR/TRUCK/MOPED) OR HEAVY MACHINERY DO NOT MIX NARCOTICS WITH OTHER CNS (CENTRAL NERVOUS SYSTEM) DEPRESSANTS SUCH  AS ALCOHOL   STOP SMOKING OR USING NICOTINE PRODUCTS!!!!  As discussed nicotine severely impairs your body's ability to heal surgical and traumatic wounds but also impairs bone healing.  Wounds and bone heal by forming microscopic blood vessels (angiogenesis) and nicotine is a vasoconstrictor (essentially, shrinks blood vessels).  Therefore, if vasoconstriction occurs to these microscopic blood vessels they essentially disappear and are unable to deliver necessary nutrients to the healing tissue.  This is one modifiable factor that you can do to dramatically increase your chances of healing your injury.    (This means no smoking, no nicotine gum, patches, etc)  DO NOT USE NONSTEROIDAL ANTI-INFLAMMATORY DRUGS (NSAID'S)  Using products such as Advil (ibuprofen), Aleve (naproxen), Motrin (ibuprofen) for additional pain control during fracture healing can delay and/or prevent the healing response.  If you would like to take over the counter (OTC) medication, Tylenol (acetaminophen) is ok.  However, some narcotic medications that are given for pain control contain acetaminophen as well. Therefore, you should not exceed more than 4000 mg of tylenol in a day if you do not have liver disease.  Also note that there are may OTC medicines, such as cold medicines and allergy medicines that my contain tylenol as well.  If you have any questions about medications and/or interactions please ask your doctor/PA or your pharmacist.      ICE AND ELEVATE INJURED/OPERATIVE EXTREMITY  Using ice and elevating the injured extremity above your heart can help with swelling and pain control.  Icing in a pulsatile fashion, such as 20 minutes on and 20 minutes off, can be  followed.    Do not place ice directly on skin. Make sure there is a barrier between to skin and the ice pack.    Using frozen items such as frozen peas works well as the conform nicely to the are that needs to be iced.  USE AN ACE WRAP OR TED HOSE FOR SWELLING  CONTROL  In addition to icing and elevation, Ace wraps or TED hose are used to help limit and resolve swelling.  It is recommended to use Ace wraps or TED hose until you are informed to stop.    When using Ace Wraps start the wrapping distally (farthest away from the body) and wrap proximally (closer to the body)   Example: If you had surgery on your leg or thing and you do not have a splint on, start the ace wrap at the toes and work your way up to the thigh        If you had surgery on your upper extremity and do not have a splint on, start the ace wrap at your fingers and work your way up to the upper arm  IF YOU ARE IN A CAM BOOT (BLACK BOOT)  You may remove boot periodically. Perform daily dressing changes as noted below.  Wash the liner of the boot regularly and wear a sock when wearing the boot. It is recommended that you sleep in the boot until told otherwise   CALL THE OFFICE WITH ANY QUESTIONS OR CONCERNS: [phone]   VISIT OUR WEBSITE FOR ADDITIONAL INFORMATION: [email or web address]    Discharge Wound Care Instructions  Do NOT apply any ointments, solutions or lotions to pin sites or surgical wounds.  These prevent needed drainage and even though solutions like hydrogen peroxide kill bacteria, they also damage cells lining the pin sites that help fight infection.  Applying lotions or ointments can keep the wounds moist and can cause them to breakdown and open up as well. This can increase the risk for infection. When in doubt call the office.  If any drainage is noted, use one layer of adaptic or Mepitel, then gauze, Kerlix, and an ace wrap. - These dressing supplies should be available at local medical supply stores [facility], [facility], etc) as well as Management consultant ([facility], [facility], [employer], etc)  Once the incision is completely dry and without drainage, it may be left open to air out.  Showering may begin 36-48 hours later.  Cleaning gently with soap and water.

## 2022-12-10 NOTE — Plan of Care (Signed)

## 2022-12-10 NOTE — Progress Notes (Signed)
Orthopaedic Trauma Progress Note  SUBJECTIVE: Patient reports pain well controlled. Sitting up comfortably in bed.  Denies distal numbness and tingling. Denies lightheadedness or dizziness.  No acute issues.    OBJECTIVE:  Vitals:   12/10/22 0657 12/10/22 0749  BP: 122/81 (!) 121/95  Pulse: 63 63  Resp: 16 15  Temp: 98.2 F (36.8 C) 97.6 F (36.4 C)  SpO2: 98% 100%    General: Sitting up in bed comfortably, NAD Respiratory: No increased work of breathing.  RLE: Dressings removed, incisions over the knee CDI.  Ankle incision with small amount of bloody drainage.  New dry dressing applied.  Incisions of the knee left open to air.  Sensation intact throughout extremity. Ankle DF/PF intact.  Able to wiggle toes.  Neurovascularly intact  IMAGING: Stable post op imaging.   LABS:  Results for orders placed or performed during the hospital encounter of 12/06/22 (from the past 24 hour(s))  C-reactive protein     Status: Abnormal   Collection Time: 12/10/22  3:23 AM  Result Value Ref Range   CRP 3.3 (H) <1.0 mg/dL  Comprehensive metabolic panel     Status: Abnormal   Collection Time: 12/10/22  3:23 AM  Result Value Ref Range   Sodium 133 (L) 135 - 145 mmol/L   Potassium 3.7 3.5 - 5.1 mmol/L   Chloride 97 (L) 98 - 111 mmol/L   CO2 24 22 - 32 mmol/L   Glucose, Bld 90 70 - 99 mg/dL   BUN 30 (H) 8 - 23 mg/dL   Creatinine, Ser 0.72 0.44 - 1.00 mg/dL   Calcium 7.7 (L) 8.9 - 10.3 mg/dL   Total Protein 4.7 (L) 6.5 - 8.1 g/dL   Albumin 2.2 (L) 3.5 - 5.0 g/dL   AST 36 15 - 41 U/L   ALT 10 0 - 44 U/L   Alkaline Phosphatase 38 38 - 126 U/L   Total Bilirubin 0.5 0.3 - 1.2 mg/dL   GFR, Estimated >60 >60 mL/min   Anion gap 12 5 - 15  CBC     Status: Abnormal   Collection Time: 12/10/22  5:48 AM  Result Value Ref Range   WBC 7.5 4.0 - 10.5 K/uL   RBC 1.93 (L) 3.87 - 5.11 MIL/uL   Hemoglobin 6.7 (LL) 12.0 - 15.0 g/dL   HCT 20.0 (L) 36.0 - 46.0 %   MCV 103.6 (H) 80.0 - 100.0 fL   MCH 34.7  (H) 26.0 - 34.0 pg   MCHC 33.5 30.0 - 36.0 g/dL   RDW 13.3 11.5 - 15.5 %   Platelets 157 150 - 400 K/uL   nRBC 0.0 0.0 - 0.2 %  Type and screen [place]     Status: None (Preliminary result)   Collection Time: 12/10/22  8:25 AM  Result Value Ref Range   ABO/RH(D) PENDING    Antibody Screen PENDING    Sample Expiration      12/13/2022,2359 Performed at [facility], [address] [place], [place] [address]   Prepare RBC (crossmatch)     Status: None   Collection Time: 12/10/22  8:25 AM  Result Value Ref Range   Order Confirmation      ORDER PROCESSED BY BLOOD BANK Performed at [facility], [place] [address] [place], [place] [address]     ASSESSMENT: [name] is a 89 y.o. female, 3 Days Post-Op s/p OPEN REDUCTION INTERNAL FIXATION RIGHT ANKLE FRACTURE OPEN REDUCTION INTERNAL FIXATION RIGHT  DISTAL FEMUR  CV/Blood loss: Acute blood loss anemia, Hgb 6.7 this AM. 1 unit PRBCs order per primary team  PLAN: Weightbearing: WBAT RLE for transfers only. Do not want and walker ambulation on RLE ROM:  ok for ROM as tolerated  Incisional and dressing care:  Removed today, ok to leave open to air. Change dressing PRN Showering: Ok to begin showering and getting incisions wet Orthopedic device(s): CAM boot when OOB Pain management:  1. Tylenol 650 mg q 4 hours PRN 2. Tramadol 50 mg q 8 hours PRN 3. Morphine 1 mg q 4 hours PRN VTE prophylaxis:  Restart Eliquis once Hgb stabilizes , SCDs ID:  Ancef 2gm post op completed Foley/Lines:  No foley, KVO IVFs Impediments to Fracture Healing: Vit D level pending, start supplementation as indicated Dispo: Therapies ongoing, PT/OT recommending SNF. Continue to monitor CBC, restart Eliquis once Hgb stable.    D/C recommendations: - Tylenol for pain control - Home dose Eliquis for DVT prophylaxis - Possible need for  Vit D supplementation  Follow - up plan: 2 weeks after d/c for wound check and repeat  x-rays as well as suture removal from right ankle   Contact information:  Katha Hamming MD, Rushie Nyhan PA-C. After hours and holidays please check [email or web address] for group call information for [facility]   Gwinda Passe, PA-C [phone] (office) [email or web address]

## 2022-12-10 NOTE — Progress Notes (Signed)
  Progress Note   Patient: [name] FWY:[id number] DOB: 1923/11/04 DOA: 12/06/2022     4 DOS: the patient was seen and examined on 12/10/2022   Brief hospital course: 89 y.o. female with medical history significant of  Paroxysmal atrial fibrillation, copd, hypertension, lives by herself at home , was brought in for a fall. Pt was found to have periprosthetic fracture of the distal femoral metaphysis and minimally angulated fracture of distal fibula and displaced fracture of medial malleolus. Pt found to be COVID pos  Assessment and Plan: Mechanical fall with Periprosthetic fracture of the distal femoral metaphysis and angulated fracture of the distal fibular dia metaphysis: Orthopedic Surgery following, now s/p surgery 12/07/22 Therapy recs for SNF noted. TOC following    Right medial malleolus fracture;  Orthopedic surgery following Cont with analgesia as needed    COVID positive infection;  She remains asymptomatic and she is not hypoxic.  Cont to follow inflammatory markers CRP from 1.7 to 3.3 today One episode of fever noted 1/26 Given one dose of IV steroids this visit Cont to follow inflammatory marker trends  Paroxysmal atrial fibrillation on eliquis, which is on hold, resume once hgb stabilizes  Acute blood loss anemia secondary to surgery -No obvious source of bleed at this time -Pallor on exam -Hgb 6.7 this AM. Ordered 1 unit PRBC -recheck cbc in AM   Hypertension:  BP stable and controlled at this time    Elevated lactic acid possibly from dehydration and COVID infection.  Normalized after hydration      Subjective: No complaints this AM  Physical Exam: Vitals:   12/10/22 1041 12/10/22 1057 12/10/22 1300 12/10/22 1512  BP: (!) 125/54 (!) 123/54 120/65 120/64  Pulse: 63 62 64 65  Resp: 14 20 19 18   Temp: (!) 97.5 F (36.4 C) 97.9 F (36.6 C) 98 F (36.7 C) 98.1 F (36.7 C)  TempSrc: Oral Oral Oral Oral  SpO2: 100% 99% 100% 99%  Weight:       Height:       General exam: Conversant, in no acute distress Respiratory system: normal chest rise, clear, no audible wheezing Cardiovascular system: regular rhythm, s1-s2 Gastrointestinal system: Nondistended, nontender, pos BS Central nervous system: No seizures, no tremors Extremities: No cyanosis, no joint deformities Skin: No rashes, no pallor Psychiatry: Affect normal // no auditory hallucinations   Data Reviewed:  Labs reviewed: Na 133, K 3.7, Cr 0.72, WBC 7.5, Hgb 6.7, Plts 157  Family Communication: Pt in room, family not at bedside  Disposition: Status is: Inpatient Remains inpatient appropriate because: Severity of illness  Planned Discharge Destination:  [facility]    Author: Marylu Lund, MD 12/10/2022 5:11 PM  For on call review [email or web address].

## 2022-12-11 DIAGNOSIS — E119 Type 2 diabetes mellitus without complications: Secondary | ICD-10-CM | POA: Diagnosis not present

## 2022-12-11 DIAGNOSIS — I480 Paroxysmal atrial fibrillation: Secondary | ICD-10-CM | POA: Diagnosis not present

## 2022-12-11 DIAGNOSIS — E559 Vitamin D deficiency, unspecified: Secondary | ICD-10-CM | POA: Diagnosis not present

## 2022-12-11 DIAGNOSIS — S82841D Displaced bimalleolar fracture of right lower leg, subsequent encounter for closed fracture with routine healing: Secondary | ICD-10-CM | POA: Diagnosis not present

## 2022-12-11 DIAGNOSIS — R52 Pain, unspecified: Secondary | ICD-10-CM | POA: Diagnosis not present

## 2022-12-11 DIAGNOSIS — S82841A Displaced bimalleolar fracture of right lower leg, initial encounter for closed fracture: Secondary | ICD-10-CM | POA: Diagnosis not present

## 2022-12-11 DIAGNOSIS — R29898 Other symptoms and signs involving the musculoskeletal system: Secondary | ICD-10-CM | POA: Diagnosis not present

## 2022-12-11 DIAGNOSIS — R296 Repeated falls: Secondary | ICD-10-CM | POA: Diagnosis not present

## 2022-12-11 DIAGNOSIS — R279 Unspecified lack of coordination: Secondary | ICD-10-CM | POA: Diagnosis not present

## 2022-12-11 DIAGNOSIS — K5900 Constipation, unspecified: Secondary | ICD-10-CM | POA: Diagnosis not present

## 2022-12-11 DIAGNOSIS — S8251XD Displaced fracture of medial malleolus of right tibia, subsequent encounter for closed fracture with routine healing: Secondary | ICD-10-CM | POA: Diagnosis not present

## 2022-12-11 DIAGNOSIS — J449 Chronic obstructive pulmonary disease, unspecified: Secondary | ICD-10-CM | POA: Diagnosis not present

## 2022-12-11 DIAGNOSIS — Z7189 Other specified counseling: Secondary | ICD-10-CM | POA: Diagnosis not present

## 2022-12-11 DIAGNOSIS — S82831D Other fracture of upper and lower end of right fibula, subsequent encounter for closed fracture with routine healing: Secondary | ICD-10-CM | POA: Diagnosis not present

## 2022-12-11 DIAGNOSIS — I10 Essential (primary) hypertension: Secondary | ICD-10-CM | POA: Diagnosis not present

## 2022-12-11 DIAGNOSIS — S72451D Displaced supracondylar fracture without intracondylar extension of lower end of right femur, subsequent encounter for closed fracture with routine healing: Secondary | ICD-10-CM | POA: Diagnosis not present

## 2022-12-11 DIAGNOSIS — Z7409 Other reduced mobility: Secondary | ICD-10-CM | POA: Diagnosis not present

## 2022-12-11 DIAGNOSIS — Z743 Need for continuous supervision: Secondary | ICD-10-CM | POA: Diagnosis not present

## 2022-12-11 DIAGNOSIS — I4891 Unspecified atrial fibrillation: Secondary | ICD-10-CM | POA: Diagnosis not present

## 2022-12-11 DIAGNOSIS — S93431D Sprain of tibiofibular ligament of right ankle, subsequent encounter: Secondary | ICD-10-CM | POA: Diagnosis not present

## 2022-12-11 DIAGNOSIS — D649 Anemia, unspecified: Secondary | ICD-10-CM | POA: Diagnosis not present

## 2022-12-11 DIAGNOSIS — T8149XA Infection following a procedure, other surgical site, initial encounter: Secondary | ICD-10-CM | POA: Diagnosis not present

## 2022-12-11 DIAGNOSIS — R1312 Dysphagia, oropharyngeal phase: Secondary | ICD-10-CM | POA: Diagnosis not present

## 2022-12-11 DIAGNOSIS — J069 Acute upper respiratory infection, unspecified: Secondary | ICD-10-CM | POA: Diagnosis not present

## 2022-12-11 DIAGNOSIS — U071 COVID-19: Secondary | ICD-10-CM | POA: Diagnosis not present

## 2022-12-11 DIAGNOSIS — R2689 Other abnormalities of gait and mobility: Secondary | ICD-10-CM | POA: Diagnosis not present

## 2022-12-11 DIAGNOSIS — M6281 Muscle weakness (generalized): Secondary | ICD-10-CM | POA: Diagnosis not present

## 2022-12-11 DIAGNOSIS — S72401D Unspecified fracture of lower end of right femur, subsequent encounter for closed fracture with routine healing: Secondary | ICD-10-CM | POA: Diagnosis not present

## 2022-12-11 DIAGNOSIS — S72001A Fracture of unspecified part of neck of right femur, initial encounter for closed fracture: Secondary | ICD-10-CM | POA: Diagnosis not present

## 2022-12-11 DIAGNOSIS — S82891A Other fracture of right lower leg, initial encounter for closed fracture: Secondary | ICD-10-CM | POA: Diagnosis not present

## 2022-12-11 DIAGNOSIS — S72401A Unspecified fracture of lower end of right femur, initial encounter for closed fracture: Secondary | ICD-10-CM | POA: Diagnosis not present

## 2022-12-11 LAB — CBC
HCT: 23.8 % — ABNORMAL LOW (ref 36.0–46.0)
Hemoglobin: 8 g/dL — ABNORMAL LOW (ref 12.0–15.0)
MCH: 33.5 pg (ref 26.0–34.0)
MCHC: 33.6 g/dL (ref 30.0–36.0)
MCV: 99.6 fL (ref 80.0–100.0)
Platelets: 175 10*3/uL (ref 150–400)
RBC: 2.39 MIL/uL — ABNORMAL LOW (ref 3.87–5.11)
RDW: 15.9 % — ABNORMAL HIGH (ref 11.5–15.5)
WBC: 6.2 10*3/uL (ref 4.0–10.5)
nRBC: 0 % (ref 0.0–0.2)

## 2022-12-11 LAB — TYPE AND SCREEN
ABO/RH(D): O POS
Antibody Screen: NEGATIVE
Unit division: 0

## 2022-12-11 LAB — BPAM RBC
Blood Product Expiration Date: 202403012359
ISSUE DATE / TIME: 202401291022
Unit Type and Rh: 5100

## 2022-12-11 LAB — CULTURE, BLOOD (ROUTINE X 2)
Culture: NO GROWTH
Culture: NO GROWTH
Special Requests: ADEQUATE

## 2022-12-11 LAB — C-REACTIVE PROTEIN: CRP: 2.2 mg/dL — ABNORMAL HIGH (ref <1.0)

## 2022-12-11 LAB — MISC LABCORP TEST (SEND OUT): Labcorp test code: 81950

## 2022-12-11 MED ORDER — DOCUSATE SODIUM 100 MG PO CAPS: 100.0000 mg | ORAL_CAPSULE | Freq: Two times a day (BID) | ORAL | 0 refills | Status: AC

## 2022-12-11 MED ORDER — SENNOSIDES-DOCUSATE SODIUM 8.6-50 MG PO TABS: 1.0000 | ORAL_TABLET | Freq: Every evening | ORAL | 0 refills | Status: DC | PRN

## 2022-12-11 MED ORDER — POLYETHYLENE GLYCOL 3350 17 G PO PACK: 17.0000 g | PACK | Freq: Every day | ORAL | 0 refills | Status: DC | PRN

## 2022-12-11 NOTE — Care Management Important Message (Signed)
Important Message  Patient Details  Name: [name] MRN: [id number] Date of Birth: 27-Aug-1923   Medicare Important Message Given:  Other (see comment)     Hannah Beat 12/11/2022, 8:24 AM

## 2022-12-11 NOTE — Plan of Care (Signed)
  Problem: Clinical Measurements: Goal: Respiratory complications will improve Outcome: Progressing   Problem: Activity: Goal: Risk for activity intolerance will decrease Outcome: Progressing   Problem: Skin Integrity: Goal: Risk for impaired skin integrity will decrease Outcome: Progressing

## 2022-12-11 NOTE — Progress Notes (Signed)
Report given to Marcie Bal ,staff nurse at [facility]. All questions and concerns were fully answered.

## 2022-12-11 NOTE — TOC Progression Note (Addendum)
Transition of Care [facility]) - Progression Note    Patient Details  Name: JEANE CASHATT MRN: [id number] Date of Birth: Mar 21, 1923  [facility] CM/SW Contact  Joanne Chars, LCSW Phone Number: 12/11/2022, 8:48 AM  Clinical Narrative:   CSW spoke with son [name] by phone and discussed bed offers.  Son not familiar with SNF options, declined CSW suggestion to visit facilities, chooses [facility] due to proximity to the hospital.    CSW confirmed bed with [facility].    1000: auth request submitted in [place] and approved: [id number], 3 days: 1/30-2/1.  MD informed.      Expected Discharge Plan: [place] Barriers to Discharge: Continued Medical Work up  Expected Discharge Plan and Services In-house Referral: Clinical Social Work   Post Acute Care Choice: [place] Living arrangements for the past 2 months: [place]                                       Social Determinants of Health (SDOH) Interventions SDOH Screenings   Depression (PHQ2-9): Low Risk  (09/12/2022)  Tobacco Use: Low Risk  (12/10/2022)    Readmission Risk Interventions     No data to display

## 2022-12-11 NOTE — Discharge Summary (Signed)
Physician Discharge Summary   Patient: [name] MRN: [id number] DOB: January 06, 1923  Admit date:     12/06/2022  Discharge date: 12/11/22  Discharge Physician: Rickey Barbara   PCP: Donita Brooks, MD   Recommendations at discharge:    Follow up with PCP in 1-2 weeks Follow up with Orthopedic Surgery as scheduled  Recommend repeat CBC in 1 week, focus on hgb  Discharge Diagnoses: Principal Problem:   Fall Active Problems:   COVID-19  Resolved Problems:   * No resolved hospital problems. *  Hospital Course: 89 y.o. female with medical history significant of  Paroxysmal atrial fibrillation, copd, hypertension, lives by herself at home , was brought in for a fall. Pt was found to have periprosthetic fracture of the distal femoral metaphysis and minimally angulated fracture of distal fibula and displaced fracture of medial malleolus. Pt found to be COVID pos  Assessment and Plan: Mechanical fall with Periprosthetic fracture of the distal femoral metaphysis and angulated fracture of the distal fibular dia metaphysis: Orthopedic Surgery following, now s/p surgery 12/07/22 Therapy recs for SNF noted. TOC had been following Eliquis resumed by Orthopedic Surgery    Right medial malleolus fracture;  Orthopedic surgery following this visit Cont with analgesia as needed    COVID positive infection;  She remains asymptomatic and she is not hypoxic.  Cont to follow inflammatory markers CRP from 1.7, peaked to 6.6, now trending down to 2.2 at time of d/c One episode of fever noted 1/26 Given one dose of IV steroids this visit Remained stable   Paroxysmal atrial fibrillation on eliquis, eliquis initially on hold -Remained rate controlled -Eliquis later resumed by Orthopedic surgery after Hgb stability was ensured   Acute blood loss anemia secondary to surgery -No obvious source of bleed at this time, suspect anemia secondary to surgery -Hgb 6.7 on 1/29. Ordered 1 unit PRBC with  appropriate correction of hgb, remained stable post-transfusion -eliquis was resumed by Orthopedic Surgery   Hypertension:  BP remained stable -cont home meds on dc    Elevated lactic acid possibly from dehydration and COVID infection.  Normalized after hydration       Consultants: Orthopedic Surgery Procedures Open reduction internal fixation of right distal femur fracture Open reduction internal fixation of right bimalleolar ankle fracture Open reduction internal fixation of right syndesmosis  Disposition: Skilled nursing facility Diet recommendation:  Regular diet DISCHARGE MEDICATION: Allergies as of 12/11/2022   No Known Allergies      Medication List     TAKE these medications    acetaminophen 325 MG tablet Commonly known as: TYLENOL Take 2 tablets (650 mg total) by mouth every 4 (four) hours as needed for fever, headache or mild pain.   apixaban 2.5 MG Tabs tablet Commonly known as: ELIQUIS Take 1 tablet (2.5 mg total) by mouth 2 (two) times daily. Dose decrease per MD.   docusate sodium 100 MG capsule Commonly known as: COLACE Take 1 capsule (100 mg total) by mouth 2 (two) times daily.   furosemide 40 MG tablet Commonly known as: LASIX Take 1 tablet (40 mg total) by mouth daily as needed. What changed: reasons to take this   Iron (Ferrous Sulfate) 325 (65 Fe) MG Tabs Take 325 mg by mouth daily.   lisinopril 40 MG tablet Commonly known as: ZESTRIL TAKE 1 TABLET BY MOUTH EVERY DAY   metoprolol tartrate 25 MG tablet Commonly known as: LOPRESSOR TAKE 1 TABLET BY MOUTH TWICE A DAY   multivitamin tablet Take  1 tablet by mouth daily.   polyethylene glycol 17 g packet Commonly known as: MIRALAX / GLYCOLAX Take 17 g by mouth daily as needed for mild constipation.   senna-docusate 8.6-50 MG tablet Commonly known as: Senokot-S Take 1 tablet by mouth at bedtime as needed for mild constipation.   vitamin D3 25 MCG tablet Commonly known as:  CHOLECALCIFEROL Take 1 tablet (1,000 Units total) by mouth daily.        Follow-up Information     Haddix, Gillie Manners, MD. Schedule an appointment as soon as possible for a visit in 2 week(s).   Specialty: Orthopedic Surgery Why: for wound check, suture removal right ankle, repeat x-rays Contact information: [address] [place] [address] [phone]         Donita Brooks, MD Follow up in 2 week(s).   Specialty: Family Medicine Why: Hospital follow up Contact information: [address] [place] [address] [phone]                Discharge Exam: Ceasar Mons Weights   12/06/22 1761  Weight: 60.8 kg   General exam: Awake, laying in bed, in nad Respiratory system: Normal respiratory effort, no wheezing Cardiovascular system: regular rate, s1, s2 Gastrointestinal system: Soft, nondistended, positive BS Central nervous system: CN2-12 grossly intact, strength intact Extremities: Perfused, no clubbing Skin: Normal skin turgor, no notable skin lesions seen Psychiatry: Mood normal // no visual hallucinations   Condition at discharge: fair  The results of significant diagnostics from this hospitalization (including imaging, microbiology, ancillary and laboratory) are listed below for reference.   Imaging Studies: DG Ankle Right Port  Result Date: 12/07/2022 CLINICAL DATA:  Fracture EXAM: PORTABLE RIGHT ANKLE - 2 VIEW COMPARISON:  Portable exam 1430 hours compared to 12/06/2022 FINDINGS: 2 screws have been placed across a reduced medial malleolar fracture. Plate and multiple screws including a syndesmotic screw have been placed across a reduced oblique fracture of the distal RIGHT fibula. No additional fracture or dislocation. Small plantar calcaneal spur. Ankle joint space preserved. Scattered vascular calcifications. IMPRESSION: Post bimalleolar ORIF RIGHT ankle. Electronically Signed   By: Ulyses Southward M.D.   On: 12/07/2022 14:53   DG Knee Right  Port  Result Date: 12/07/2022 CLINICAL DATA:  Fracture EXAM: PORTABLE RIGHT KNEE - 1-2 VIEW COMPARISON:  None Available. FINDINGS: Tip of the femoral component of a RIGHT hip prosthesis is identified. Lateral plate and multiple screws identified at mid to distal RIGHT femoral diaphysis into metaphysis post ORIF. RIGHT knee prosthesis. Osseous demineralization. No acute fracture, dislocation, or bone destruction. Scattered atherosclerotic calcifications. IMPRESSION: Osseous demineralization and postsurgical changes as above. No acute abnormalities. Electronically Signed   By: Ulyses Southward M.D.   On: 12/07/2022 14:52   DG Ankle Complete Right  Result Date: 12/07/2022 CLINICAL DATA:  ORIF ankle fracture EXAM: RIGHT ANKLE - COMPLETE 3+ VIEW COMPARISON:  12/06/2022 FINDINGS: Five fluoroscopic images are obtained during the performance of the procedure and are provided for interpretation only. Images demonstrate plate and screw fixation of a distal fibular fracture and screw fixation of the medial malleolar fracture. Fluoroscopy time: 37 seconds Cumulative air kerma: 0.8 mGy IMPRESSION: ORIF ankle fracture. Electronically Signed   By: Wiliam Ke M.D.   On: 12/07/2022 13:11   DG C-Arm 1-60 Min-No Report  Result Date: 12/07/2022 Fluoroscopy was utilized by the requesting physician.  No radiographic interpretation.   DG FEMUR, MIN 2 VIEWS RIGHT  Result Date: 12/07/2022 CLINICAL DATA:  Open  reduction internal fixation of distal femoral fracture. EXAM: RIGHT FEMUR 2 VIEWS COMPARISON:  Right knee radiographs 12/06/2022; CT right knee 12/06/2022 FINDINGS: Images were performed intraoperatively without the presence of a radiologist. Redemonstration of total right knee arthroplasty. Note is made of distal femoral metaphyseal periprosthetic fracture seen previously. New lateral plate and screw fixation of the distal femur. No hardware complication is seen. Total fluoroscopy images: 5 Total fluoroscopy time: 108  seconds Total dose: Radiation Exposure Index (as provided by the fluoroscopic device): 7.96 mGy air Kerma Please see intraoperative findings for further detail. IMPRESSION: Open reduction internal fixation of distal femoral periprosthetic fracture. Electronically Signed   By: Neita Garnet M.D.   On: 12/07/2022 12:51   DG C-Arm 1-60 Min-No Report  Result Date: 12/07/2022 Fluoroscopy was utilized by the requesting physician.  No radiographic interpretation.   DG Ankle Complete Right  Result Date: 12/06/2022 CLINICAL DATA:  Right ankle fracture EXAM: RIGHT ANKLE - COMPLETE 3+ VIEW COMPARISON:  12/06/2022 FINDINGS: Generalized osteopenia. Oblique minimally angulated fracture of the distal fibular diametaphysis. Mm displaced fracture of the medial malleolus. No other fracture or dislocation. Lateral subluxation of the talar dome relative to the tibial plafond. Soft tissue swelling around the ankle. IMPRESSION: 1. Oblique minimally angulated fracture of the distal fibular diametaphysis. 2. Displaced fracture of the medial malleolus. Electronically Signed   By: Elige Ko M.D.   On: 12/06/2022 10:24   CT KNEE RIGHT WO CONTRAST  Result Date: 12/06/2022 CLINICAL DATA:  Knee trauma, occult fracture suspected. Found down. Possible deformity. EXAM: CT OF THE RIGHT KNEE WITHOUT CONTRAST TECHNIQUE: Multidetector CT imaging of the right knee was performed according to the standard protocol. Multiplanar CT image reconstructions were also generated. RADIATION DOSE REDUCTION: This exam was performed according to the departmental dose-optimization program which includes automated exposure control, adjustment of the mA and/or kV according to patient size and/or use of iterative reconstruction technique. COMPARISON:  Radiographs 12/06/2022. FINDINGS: Bones/Joint/Cartilage Status post right total knee arthroplasty with resulting beam hardening artifact. The bones are diffusely demineralized. There is a periprosthetic  fracture of the distal femoral metaphysis with up to 1.3 cm of lateral displacement, best seen on coronal image 70/7. Along the medial metaphysis, there is only minimal displacement. This fracture appears mildly impacted. The proximal tibia and proximal fibula appear intact. No hardware loosening identified. There is a moderate sized lipohemarthrosis. Ligaments Suboptimally assessed by CT. Muscles and Tendons Mild generalized muscular atrophy about the knee, especially within the medial head of the gastrocnemius muscle. The extensor mechanism appears intact. Soft tissues Moderate soft tissue swelling about the knee, greatest anteriorly and medially. Prominent vascular calcifications are noted. No soft tissue emphysema or unexpected foreign body. IMPRESSION: 1. Periprosthetic fracture of the distal femoral metaphysis with up to 1.3 cm of lateral displacement and mild impaction. 2. No evidence of proximal tibial or fibular fracture. 3. Moderate-sized lipohemarthrosis. 4. Moderate soft tissue swelling about the knee. Electronically Signed   By: Carey Bullocks M.D.   On: 12/06/2022 09:53   DG Knee Right Port  Result Date: 12/06/2022 CLINICAL DATA:  89 year old female status post fall. EXAM: PORTABLE RIGHT KNEE - 1-2 VIEW COMPARISON:  Right femur series today. Previous right knee series 05/25/2014. FINDINGS: Continued malalignment at the distal right femoral condyle with the femoral total knee are arthroplasty component. However, the femoral and tibial arthroplasty components remain aligned. On the lateral view there is evidence of impacted fracture fragments. And I suspect this is a distal femur impaction fracture  with about 1/2 shaft width lateral displacement. Underlying advanced osteopenia. Proximal tibia and fibula appear intact. Patella appears intact. There is evidence of a joint effusion, although no obvious lipohemarthrosis on the cross-table lateral. Advanced calcified peripheral vascular disease.  IMPRESSION: 1. Osteopenia with constellation most compatible with an impacted distal right femoral condyle fracture about the femoral component of the total knee arthroplasty. 1/2 shaft width lateral displacement. 2. Femoral and tibial arthroplasty components appear to remain aligned. Evidence of joint effusion. Electronically Signed   By: Odessa Fleming M.D.   On: 12/06/2022 06:51   DG Ankle Right Port  Result Date: 12/06/2022 CLINICAL DATA:  89 year old female status post fall with osteopenia and distal fibula fracture on tib fib series. EXAM: PORTABLE RIGHT ANKLE - 2 VIEW COMPARISON:  Tib fib series today reported separately. FINDINGS: Advanced calcified peripheral vascular disease. Oblique distal right fibula fracture is fairly centered at the metadiaphysis and tracks to the level of the tibial plafond. Superimposed minimally displaced medial malleolus fracture. Posterior malleolus seems to remain intact along with the talar dome and calcaneus. Calcaneus degenerative spurring. Relatively maintained mortise joint alignment. Regional soft tissue swelling. Grossly intact visible other bones of the right foot. IMPRESSION: 1. Osteopenia with minimally displaced bimalleolar type fracture 2. Regional soft tissue swelling. Advanced calcified peripheral vascular disease. Electronically Signed   By: Odessa Fleming M.D.   On: 12/06/2022 06:49   DG Pelvis Portable  Result Date: 12/06/2022 CLINICAL DATA:  89 year old female status post fall on blood thinners. EXAM: PORTABLE PELVIS 1-2 VIEWS COMPARISON:  Right femur series today.  Right hip series 05/25/2014. FINDINGS: Portable AP supine view at 0540 hours. Osteopenia. Partially visible right hip arthroplasty. On these images it is difficult to exclude an acute impacted fracture of the right femur medial intertrochanteric segment. No superimposed pelvis fracture identified. Left femoral head remains normally located. Chronic lower lumbar fusion hardware. Calcified iliofemoral  atherosclerosis. Negative visible bowel gas pattern. IMPRESSION: 1. On this image there is strong suspicion of impacted Acute Fracture At The Proximal Right Femur Intertrochanteric segment, superimposed on chronic right hip arthroplasty. This was not apparent on the right femur series today. 2. Osteopenia.  No superimposed pelvis fracture identified. Electronically Signed   By: Odessa Fleming M.D.   On: 12/06/2022 06:20   DG FEMUR PORT, 1V RIGHT  Result Date: 12/06/2022 CLINICAL DATA:  89 year old female status post fall on blood thinners. EXAM: RIGHT FEMUR PORTABLE 1 VIEW COMPARISON:  Right hip series 05/25/2014. Right knee series 06/09/2014. FINDINGS: Chronic right hip arthroplasty. Underlying osteopenia. Right hip hardware appears stable and intact on these two views. Visible right hemipelvis grossly intact. Healed fracture of the proximal right femur with some callus formation at the intertrochanteric segment. Right femoral shaft appears intact. However, there appears to be malalignment of the distal right femur knee arthroplasty hardware. But the alignment with the tibial plateau hardware seems maintained. Extensive right femoral and lower extremity calcified peripheral vascular disease. IMPRESSION: 1. Recommend dedicated Right Knee Series for suspected distal femur fracture, malalignment at the femoral component of the right total knee arthroplasty. 2. Osteopenia. No definite additional right femur fracture. Grossly stable chronic right hip arthroplasty. 3. Advanced calcified peripheral vascular disease. Electronically Signed   By: Odessa Fleming M.D.   On: 12/06/2022 06:18   DG Tibia/Fibula Right Port  Result Date: 12/06/2022 CLINICAL DATA:  89 year old female status post fall on blood thinners. EXAM: PORTABLE RIGHT TIBIA AND FIBULA - 2 VIEW COMPARISON:  Right knee series 06/09/2014. FINDINGS:  Calcified peripheral vascular disease. Osteopenia. Partially visible chronic right total knee arthroplasty. Oblique  minimally displaced fracture of the distal right fibula shaft near the junction with the metadiaphysis. This is only apparent on image #1. Distal tibia and mortise joint alignment grossly maintained. Tib fib midshaft and proximal tibia and fibula appear to remain intact. Widespread soft tissue swelling, stranding. No soft tissue gas identified. Partially visible extensive foot soft tissue swelling. Calcaneus appears intact. IMPRESSION: 1. Osteopenia with subtle oblique fracture of the distal right fibula shaft, beginning about 5 cm proximal to the ankle joint. Dedicated right ankle series would be valuable. 2. No other right tib fib fracture identified. Partially visible chronic knee arthroplasty. 3. Severe calcified peripheral vascular disease. Electronically Signed   By: Genevie Ann M.D.   On: 12/06/2022 06:15   DG Chest Port 1 View  Result Date: 12/06/2022 CLINICAL DATA:  89 year old female with history of trauma from a fall. On blood thinners. EXAM: PORTABLE CHEST 1 VIEW COMPARISON:  Chest x-ray 07/02/2019. FINDINGS: Lung volumes are normal. No consolidative airspace disease. Diffuse interstitial prominence and widespread peribronchial cuffing, unchanged. No pleural effusions. No pneumothorax. No evidence of pulmonary edema. Heart size is mildly enlarged. Upper mediastinal contours are within normal limits allowing for patient positioning. Atherosclerotic calcifications in the thoracic aorta. Left-sided pacemaker device in place with lead tips projecting over the expected location of the right atrium and right ventricle. IMPRESSION: 1. No definite radiographic evidence of acute cardiopulmonary disease. The appearance of the lungs suggests chronic bronchitis, similar to prior examination. 2. Cardiomegaly. 3. Aortic atherosclerosis. Electronically Signed   By: Vinnie Langton M.D.   On: 12/06/2022 06:13   CT HEAD WO CONTRAST  Result Date: 12/06/2022 CLINICAL DATA:  89 year old female with history of trauma  after a fall. EXAM: CT HEAD WITHOUT CONTRAST CT CERVICAL SPINE WITHOUT CONTRAST TECHNIQUE: Multidetector CT imaging of the head and cervical spine was performed following the standard protocol without intravenous contrast. Multiplanar CT image reconstructions of the cervical spine were also generated. RADIATION DOSE REDUCTION: This exam was performed according to the departmental dose-optimization program which includes automated exposure control, adjustment of the mA and/or kV according to patient size and/or use of iterative reconstruction technique. COMPARISON:  Head CT 11/12/2016.  No prior cervical spine CT. FINDINGS: CT HEAD FINDINGS Brain: Moderate cerebral and mild cerebellar atrophy. Patchy and confluent areas of decreased attenuation are noted throughout the deep and periventricular white matter of the cerebral hemispheres bilaterally, compatible with chronic microvascular ischemic disease. Well-defined area of low attenuation in the right parietal region, similar to the prior study, compatible with encephalomalacia from an old right MCA/PCA territory watershed infarct. There is also a well-defined area of low attenuation in the periphery of the right cerebellar hemisphere, similar to the prior study, compatible with an old infarct. Multiple tiny well-defined foci of low attenuation are noted scattered throughout the basal ganglia bilaterally, compatible with old lacunar infarcts. No evidence of acute infarction, hemorrhage, hydrocephalus, extra-axial collection or mass lesion/mass effect. Vascular: No hyperdense vessel or unexpected calcification. Skull: Normal. Negative for fracture or focal lesion. Sinuses/Orbits: No acute finding. Other: None. CT CERVICAL SPINE FINDINGS Alignment: Reversal of normal cervical lordosis centered at the level of C4, likely positional and/or chronic and degenerative in nature. Associated with this area is 3 mm of anterolisthesis of C3 upon C4 and 2 mm of anterolisthesis of  C4 upon C5. Skull base and vertebrae: No acute fracture. No primary bone lesion or focal pathologic process. Soft  tissues and spinal canal: No prevertebral fluid or swelling. No visible canal hematoma. Disc levels: Severe multilevel degenerative disc disease, most pronounced at C4-C5, C5-C6 and C6-C7. Severe multilevel facet arthropathy bilaterally. Upper chest: Unremarkable. Other: None. IMPRESSION: 1. No evidence of significant acute traumatic injury to the skull, brain or cervical spine. 2. Moderate cerebral and mild cerebellar atrophy with extensive chronic microvascular ischemic changes in the cerebral white matter, multiple old lacunar infarcts throughout the basal ganglia bilaterally, old right cerebellar infarcts, and old right MCA/PCA territory infarct, as detailed above. 3. Severe multilevel degenerative disc disease and cervical spondylosis. Electronically Signed   By: Trudie Reed M.D.   On: 12/06/2022 06:10   CT CERVICAL SPINE WO CONTRAST  Result Date: 12/06/2022 CLINICAL DATA:  89 year old female with history of trauma after a fall. EXAM: CT HEAD WITHOUT CONTRAST CT CERVICAL SPINE WITHOUT CONTRAST TECHNIQUE: Multidetector CT imaging of the head and cervical spine was performed following the standard protocol without intravenous contrast. Multiplanar CT image reconstructions of the cervical spine were also generated. RADIATION DOSE REDUCTION: This exam was performed according to the departmental dose-optimization program which includes automated exposure control, adjustment of the mA and/or kV according to patient size and/or use of iterative reconstruction technique. COMPARISON:  Head CT 11/12/2016.  No prior cervical spine CT. FINDINGS: CT HEAD FINDINGS Brain: Moderate cerebral and mild cerebellar atrophy. Patchy and confluent areas of decreased attenuation are noted throughout the deep and periventricular white matter of the cerebral hemispheres bilaterally, compatible with chronic  microvascular ischemic disease. Well-defined area of low attenuation in the right parietal region, similar to the prior study, compatible with encephalomalacia from an old right MCA/PCA territory watershed infarct. There is also a well-defined area of low attenuation in the periphery of the right cerebellar hemisphere, similar to the prior study, compatible with an old infarct. Multiple tiny well-defined foci of low attenuation are noted scattered throughout the basal ganglia bilaterally, compatible with old lacunar infarcts. No evidence of acute infarction, hemorrhage, hydrocephalus, extra-axial collection or mass lesion/mass effect. Vascular: No hyperdense vessel or unexpected calcification. Skull: Normal. Negative for fracture or focal lesion. Sinuses/Orbits: No acute finding. Other: None. CT CERVICAL SPINE FINDINGS Alignment: Reversal of normal cervical lordosis centered at the level of C4, likely positional and/or chronic and degenerative in nature. Associated with this area is 3 mm of anterolisthesis of C3 upon C4 and 2 mm of anterolisthesis of C4 upon C5. Skull base and vertebrae: No acute fracture. No primary bone lesion or focal pathologic process. Soft tissues and spinal canal: No prevertebral fluid or swelling. No visible canal hematoma. Disc levels: Severe multilevel degenerative disc disease, most pronounced at C4-C5, C5-C6 and C6-C7. Severe multilevel facet arthropathy bilaterally. Upper chest: Unremarkable. Other: None. IMPRESSION: 1. No evidence of significant acute traumatic injury to the skull, brain or cervical spine. 2. Moderate cerebral and mild cerebellar atrophy with extensive chronic microvascular ischemic changes in the cerebral white matter, multiple old lacunar infarcts throughout the basal ganglia bilaterally, old right cerebellar infarcts, and old right MCA/PCA territory infarct, as detailed above. 3. Severe multilevel degenerative disc disease and cervical spondylosis. Electronically  Signed   By: Trudie Reed M.D.   On: 12/06/2022 06:10    Microbiology: Results for orders placed or performed during the hospital encounter of 12/06/22  Resp panel by RT-PCR (RSV, Flu A&B, Covid) Anterior Nasal Swab     Status: Abnormal   Collection Time: 12/06/22 10:02 AM   Specimen: Anterior Nasal Swab  Result Value Ref  Range Status   SARS Coronavirus 2 by RT PCR POSITIVE (A) NEGATIVE Final    Comment: (NOTE) SARS-CoV-2 target nucleic acids are DETECTED.  The SARS-CoV-2 RNA is generally detectable in upper respiratory specimens during the acute phase of infection. Positive results are indicative of the presence of the identified virus, but do not rule out bacterial infection or co-infection with other pathogens not detected by the test. Clinical correlation with patient history and other diagnostic information is necessary to determine patient infection status. The expected result is Negative.  Fact Sheet for Patients: EntrepreneurPulse.com.au  Fact Sheet for Healthcare Providers: IncredibleEmployment.be  This test is not yet approved or cleared by the [place] FDA and  has been authorized for detection and/or diagnosis of SARS-CoV-2 by FDA under an Emergency Use Authorization (EUA).  This EUA will remain in effect (meaning this test can be used) for the duration of  the COVID-19 declaration under Section 564(b)(1) of the A ct, 21 U.S.C. section 360bbb-3(b)(1), unless the authorization is terminated or revoked sooner.     Influenza A by PCR NEGATIVE NEGATIVE Final   Influenza B by PCR NEGATIVE NEGATIVE Final    Comment: (NOTE) The Xpert Xpress SARS-CoV-2/FLU/RSV plus assay is intended as an aid in the diagnosis of influenza from Nasopharyngeal swab specimens and should not be used as a sole basis for treatment. Nasal washings and aspirates are unacceptable for Xpert Xpress SARS-CoV-2/FLU/RSV testing.  Fact Sheet for  Patients: EntrepreneurPulse.com.au  Fact Sheet for Healthcare Providers: IncredibleEmployment.be  This test is not yet approved or cleared by the [place] FDA and has been authorized for detection and/or diagnosis of SARS-CoV-2 by FDA under an Emergency Use Authorization (EUA). This EUA will remain in effect (meaning this test can be used) for the duration of the COVID-19 declaration under Section 564(b)(1) of the Act, 21 U.S.C. section 360bbb-3(b)(1), unless the authorization is terminated or revoked.     Resp Syncytial Virus by PCR NEGATIVE NEGATIVE Final    Comment: (NOTE) Fact Sheet for Patients: EntrepreneurPulse.com.au  Fact Sheet for Healthcare Providers: IncredibleEmployment.be  This test is not yet approved or cleared by the [place] FDA and has been authorized for detection and/or diagnosis of SARS-CoV-2 by FDA under an Emergency Use Authorization (EUA). This EUA will remain in effect (meaning this test can be used) for the duration of the COVID-19 declaration under Section 564(b)(1) of the Act, 21 U.S.C. section 360bbb-3(b)(1), unless the authorization is terminated or revoked.  Performed at [facility], [place] [address], [place], [place] [address]   Respiratory (~20 pathogens) panel by PCR     Status: None   Collection Time: 12/06/22 10:02 AM   Specimen: Anterior Nasal Swab; Respiratory  Result Value Ref Range Status   Adenovirus NOT DETECTED NOT DETECTED Final   Coronavirus 229E NOT DETECTED NOT DETECTED Final    Comment: (NOTE) The Coronavirus on the Respiratory Panel, DOES NOT test for the novel  Coronavirus (2019 nCoV)    Coronavirus HKU1 NOT DETECTED NOT DETECTED Final   Coronavirus NL63 NOT DETECTED NOT DETECTED Final   Coronavirus OC43 NOT DETECTED NOT DETECTED Final   Metapneumovirus NOT DETECTED NOT DETECTED Final   Rhinovirus / Enterovirus NOT DETECTED NOT  DETECTED Final   Influenza A NOT DETECTED NOT DETECTED Final   Influenza B NOT DETECTED NOT DETECTED Final   Parainfluenza Virus 1 NOT DETECTED NOT DETECTED Final   Parainfluenza Virus 2 NOT DETECTED NOT DETECTED Final   Parainfluenza Virus 3 NOT  DETECTED NOT DETECTED Final   Parainfluenza Virus 4 NOT DETECTED NOT DETECTED Final   Respiratory Syncytial Virus NOT DETECTED NOT DETECTED Final   Bordetella pertussis NOT DETECTED NOT DETECTED Final   Bordetella Parapertussis NOT DETECTED NOT DETECTED Final   Chlamydophila pneumoniae NOT DETECTED NOT DETECTED Final   Mycoplasma pneumoniae NOT DETECTED NOT DETECTED Final    Comment: Performed at [facility], [place] [address], [place], [place] [address]  Culture, blood (Routine X 2) w Reflex to ID Panel     Status: None   Collection Time: 12/06/22  3:14 PM   Specimen: BLOOD  Result Value Ref Range Status   Specimen Description BLOOD LEFT ANTECUBITAL  Final   Special Requests   Final    BOTTLES DRAWN AEROBIC AND ANAEROBIC Blood Culture results may not be optimal due to an inadequate volume of blood received in culture bottles   Culture   Final    NO GROWTH 5 DAYS Performed at [facility], [place] [address], [place], [place] [address]    Report Status 12/11/2022 FINAL  Final  Culture, blood (Routine X 2) w Reflex to ID Panel     Status: None   Collection Time: 12/06/22  3:19 PM   Specimen: BLOOD  Result Value Ref Range Status   Specimen Description BLOOD RIGHT ANTECUBITAL  Final   Special Requests   Final    BOTTLES DRAWN AEROBIC AND ANAEROBIC Blood Culture adequate volume   Culture   Final    NO GROWTH 5 DAYS Performed at [facility], [place] [address] [place], [place] [address]    Report Status 12/11/2022 FINAL  Final  Urine Culture (for pregnant, neutropenic or urologic patients or patients with an indwelling urinary catheter)     Status: Abnormal   Collection Time: 12/06/22  9:30 PM   Specimen: Urine, Clean Catch   Result Value Ref Range Status   Specimen Description URINE, CLEAN CATCH  Final   Special Requests   Final    NONE Performed at [facility], [place] [address], [place],  [address]    Culture MULTIPLE SPECIES PRESENT, SUGGEST RECOLLECTION (A)  Final   Report Status 12/08/2022 FINAL  Final    Labs: CBC: Recent Labs  Lab 12/07/22 0317 12/08/22 0155 12/09/22 0325 12/10/22 0548 12/10/22 1704 12/11/22 0501  WBC 5.3 8.0 9.8 7.5  --  6.2  HGB 9.2* 8.1* 7.2* 6.7* 8.0* 8.0*  HCT 28.1* 25.3* 22.7* 20.0* 23.9* 23.8*  MCV 102.9* 108.1* 105.1* 103.6*  --  99.6  PLT 79* 125* 146* 157  --  510   Basic Metabolic Panel: Recent Labs  Lab 12/06/22 0537 12/06/22 0659 12/07/22 0317 12/08/22 0155 12/09/22 0325 12/10/22 0323  NA 134* 135 132* 130* 133* 133*  K 3.8 3.8 3.6 4.0 4.4 3.7  CL 98 99 101 99 99 97*  CO2 24  --  25 24 26 24   GLUCOSE 150* 140* 95 138* 157* 90  BUN 24* 29* 15 23 33* 30*  CREATININE 0.69 0.60 0.55 0.86 0.87 0.72  CALCIUM 8.5*  --  7.5* 7.3* 7.7* 7.7*   Liver Function Tests: Recent Labs  Lab 12/06/22 0537 12/06/22 2122 12/07/22 0317 12/09/22 0325 12/10/22 0323  AST 41 34 37 37 36  ALT 22 21 18 9 10   ALKPHOS 56 41 43 41 38  BILITOT 0.8 0.2* 0.3 0.4 0.5  PROT 6.6 5.2* 4.9* 5.0* 4.7*  ALBUMIN 3.3* 2.5* 2.4* 2.3* 2.2*  CBG: No results for input(s): "GLUCAP" in the last 168 hours.  Discharge time spent: less than 30 minutes.  Signed: Rickey Barbara, MD [facility] 12/11/2022

## 2022-12-11 NOTE — Progress Notes (Signed)
1/29 Pt under Airborne and Contact Precautions. I spoke to patient's son ([name]) via telephone, verbally acknowledged the IMM Letter after I explained the contents of the letter. Letter will be mailed to the patient's address on file.

## 2022-12-11 NOTE — TOC Progression Note (Signed)
Transition of Care [facility]) - Progression Note    Patient Details  Name: [name] MRN: [id number] Date of Birth: Nov 08, 1923  Transition of Care [facility]) CM/SW Contact  Joanne Chars, LCSW Phone Number: 12/11/2022, 8:50 AM  Clinical Narrative:    CSW confirmed with SNF facilities who had made bed offers that pt was covid positive as of 1/25.  [facility]: can both take covid positive pt right away, need auth. [facility]: would need to wait until day 11 after positive test. [facility]: would need to wait until day 11 after positive test. [facility]: said they would check, then did not respond again.   [facility]: could take immediately and isolate, but they do not have room available currently.     Expected Discharge Plan: [place] Barriers to Discharge: Continued Medical Work up  Expected Discharge Plan and Services In-house Referral: Clinical Social Work   Post Acute Care Choice: [place] Living arrangements for the past 2 months: [place]                                       Social Determinants of Health (SDOH) Interventions SDOH Screenings   Depression (PHQ2-9): Low Risk  (09/12/2022)  Tobacco Use: Low Risk  (12/10/2022)    Readmission Risk Interventions     No data to display

## 2022-12-11 NOTE — Progress Notes (Signed)
Mobility Specialist Progress Note   12/11/22 1100  Mobility  Activity Transferred from chair to bed  Level of Assistance +2 (takes two people)  Assistive Device Other (Comment) (HHA)  Distance Ambulated (ft) 2 ft  RLE Weight Bearing WBAT (w/ CAM boot)  Activity Response Tolerated well  Mobility Referral Yes  $Mobility charge 1 Mobility   RN requesting assistance to get pt from chair to bed d/t increasing discomfort. Required +2A to stand and pivot to EOB, no faults on transfer. Pt left in bed with all needs met, and call bell in reach, bed alarm on.   [place] Mobility Specialist Please contact via SecureChat or  Rehab office at [phone]

## 2022-12-11 NOTE — Progress Notes (Signed)
Physical Therapy Treatment Patient Details Name: [name] MRN: [id number] DOB: Jul 13, 1923 Today's Date: 12/11/2022   History of Present Illness Pt is a 89 yo female who presents on 12/06/22 after falling at home and sustaining a R distal periprosthetic femur fx and displaced bimalleolar fx of the R ankle. Pt also found to be covid +, asymptomatic. Underwent ORIF R ankle  and R femur on 1/26.  PMH:  Paroxysmal atrial fibrillation, copd, hypertension. Lives alone.    PT Comments    Pt received in bed, noted to have a wet cough today but VSS on RA. Gave pt IS and taught to use end of session. Pt requires max A to come to EOB in part due to fear of falling out of bed. Pt beginning to move RLE independently when not coming to EOB.  Pt able to maintain sitting EOB with supervision as well as scoot fwd. Pt transferred bed to chair to L with max A +2 and was able to assist with LLE. Pt performed there ex in chair. PT will continue to follow.   Recommendations for follow up therapy are one component of a multi-disciplinary discharge planning process, led by the attending physician.  Recommendations may be updated based on patient status, additional functional criteria and insurance authorization.  Follow Up Recommendations  Skilled nursing-short term rehab (<3 hours/day) Can patient physically be transported by private vehicle: No   Assistance Recommended at Discharge Frequent or constant Supervision/Assistance  Patient can return home with the following Two people to help with walking and/or transfers;Two people to help with bathing/dressing/bathroom;Assistance with feeding;Assistance with cooking/housework;Assist for transportation;Help with stairs or ramp for entrance;Direct supervision/assist for medications management;Direct supervision/assist for financial management   Equipment Recommendations  Wheelchair (measurements PT)    Recommendations for Other Services       Precautions /  Restrictions Precautions Precautions: Fall Required Braces or Orthoses: Other Brace Other Brace: CAM Boot R Restrictions Weight Bearing Restrictions: Yes RLE Weight Bearing: Weight bearing as tolerated Other Position/Activity Restrictions: transfers only     Mobility  Bed Mobility Overal bed mobility: Needs Assistance Bed Mobility: Supine to Sit     Supine to sit: Max assist     General bed mobility comments: pt resistant to mvmt today due to fear of falling. Max A to come to EOB but once up to EOB was able to scoot self fwd using UE's.    Transfers Overall transfer level: Needs assistance Equipment used: None Transfers: Bed to chair/wheelchair/BSC       Squat pivot transfers: Max assist, +2 physical assistance     General transfer comment: use of pad to facilitate squat pivot to L to recliner. Pt able to assist with LLE.    Ambulation/Gait               General Gait Details: no ambulation per MD orders   Stairs             Wheelchair Mobility    Modified Rankin (Stroke Patients Only)       Balance Overall balance assessment: Needs assistance Sitting-balance support: Feet supported, Single extremity supported Sitting balance-Leahy Scale: Fair Sitting balance - Comments: pt able to maintain balance sitting EOB today, improvement from last session   Standing balance support: Bilateral upper extremity supported Standing balance-Leahy Scale: Zero                              Cognition Arousal/Alertness:  Awake/alert Behavior During Therapy: WFL for tasks assessed/performed Overall Cognitive Status: No family/caregiver present to determine baseline cognitive functioning                                 General Comments: appears to be baseline        Exercises General Exercises - Lower Extremity Quad Sets: AROM, Both, 10 reps, Seated Gluteal Sets: AROM, Both, 10 reps, Seated Hip ABduction/ADduction: AAROM, Right,  10 reps, Seated Straight Leg Raises: AAROM, Right, 10 reps, Seated    General Comments General comments (skin integrity, edema, etc.): VSS on RA. Pt with cough today that was not noted on eval. Given incentive spirometer and taught how to use, goals set at 1000. 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      Pertinent Vitals/Pain Pain Assessment Pain Assessment: Faces Faces Pain Scale: Hurts even more Pain Location: R hip and R ankle wtih movement. 0 at rest Pain Descriptors / Indicators: Grimacing, Guarding, Sore Pain Intervention(s): Limited activity within patient's tolerance, Monitored during session    Home Living                          Prior Function            PT Goals (current goals can now be found in the care plan section) Acute Rehab PT Goals Patient Stated Goal: less pain PT Goal Formulation: With patient Time For Goal Achievement: 12/22/22 Potential to Achieve Goals: Good Progress towards PT goals: Progressing toward goals    Frequency    Min 3X/week      PT Plan Current plan remains appropriate    Co-evaluation              AM-PAC PT "6 Clicks" Mobility   Outcome Measure  Help needed turning from your back to your side while in a flat bed without using bedrails?: Total Help needed moving from lying on your back to sitting on the side of a flat bed without using bedrails?: Total Help needed moving to and from a bed to a chair (including a wheelchair)?: Total Help needed standing up from a chair using your arms (e.g., wheelchair or bedside chair)?: Total Help needed to walk in hospital room?: Total Help needed climbing 3-5 steps with a railing? : Total 6 Click Score: 6    End of Session Equipment Utilized During Treatment: Gait belt Activity Tolerance: Patient tolerated treatment well Patient left: with call bell/phone within reach;in chair;with chair alarm set Nurse  Communication: Mobility status PT Visit Diagnosis: Unsteadiness on feet (R26.81);Muscle weakness (generalized) (M62.81);History of falling (Z91.81);Difficulty in walking, not elsewhere classified (R26.2);Pain Pain - Right/Left: Right Pain - part of body: Hip;Ankle and joints of foot     Time: 4782-9562 PT Time Calculation (min) (ACUTE ONLY): 29 min  Charges:  $Therapeutic Exercise: 8-22 mins $Therapeutic Activity: 8-22 mins                     Leighton Roach, PT  Acute Rehab Services Secure chat preferred Office [place] 12/11/2022, 9:45 AM

## 2022-12-11 NOTE — Progress Notes (Signed)
Discharge summary packet/necessary documents provided to [facility] staff, Pt d/c to [facility] as ordered. Pt remains alert in no apparent distress.

## 2022-12-11 NOTE — TOC Transition Note (Signed)
Transition of Care [facility]) - CM/SW Discharge Note   Patient Details  Name: MARVINE ENCALADE MRN: [id number] Date of Birth: 09-18-1923  Transition of Care [facility]) CM/SW Contact:  Joanne Chars, LCSW Phone Number: 12/11/2022, 1:30 PM   Clinical Narrative:  PT discharging to [facility].  RN call report to [phone].       Final next level of care: Skilled Nursing Facility Barriers to Discharge: Barriers Resolved   Patient Goals and CMS Choice CMS Medicare.gov Compare Post Acute Care list provided to:: Patient Represenative (must comment) (discussed by phone, list to be left in room) Choice offered to / list presented to : Adult Children (step-son)  Discharge Placement                Patient chooses bed at:  [facility]) Patient to be transferred to facility by: [place] Name of family member notified: son [name] Patient and family notified of of transfer: 12/11/22  Discharge Plan and Services Additional resources added to the After Visit Summary for   In-house Referral: Clinical Social Work   Post Acute Care Choice: [place]                               Social Determinants of Health (SDOH) Interventions SDOH Screenings   Depression (PHQ2-9): Low Risk  (09/12/2022)  Tobacco Use: Low Risk  (12/10/2022)     Readmission Risk Interventions     No data to display

## 2022-12-12 DIAGNOSIS — R52 Pain, unspecified: Secondary | ICD-10-CM | POA: Diagnosis not present

## 2022-12-12 DIAGNOSIS — E559 Vitamin D deficiency, unspecified: Secondary | ICD-10-CM | POA: Diagnosis not present

## 2022-12-12 DIAGNOSIS — S82891A Other fracture of right lower leg, initial encounter for closed fracture: Secondary | ICD-10-CM | POA: Diagnosis not present

## 2022-12-12 DIAGNOSIS — I10 Essential (primary) hypertension: Secondary | ICD-10-CM | POA: Diagnosis not present

## 2022-12-12 DIAGNOSIS — K5900 Constipation, unspecified: Secondary | ICD-10-CM | POA: Diagnosis not present

## 2022-12-12 DIAGNOSIS — I4891 Unspecified atrial fibrillation: Secondary | ICD-10-CM | POA: Diagnosis not present

## 2022-12-12 DIAGNOSIS — S72401A Unspecified fracture of lower end of right femur, initial encounter for closed fracture: Secondary | ICD-10-CM | POA: Diagnosis not present

## 2022-12-12 DIAGNOSIS — U071 COVID-19: Secondary | ICD-10-CM | POA: Diagnosis not present

## 2022-12-13 DIAGNOSIS — I10 Essential (primary) hypertension: Secondary | ICD-10-CM | POA: Diagnosis not present

## 2022-12-13 DIAGNOSIS — Z7409 Other reduced mobility: Secondary | ICD-10-CM | POA: Diagnosis not present

## 2022-12-13 DIAGNOSIS — S72401A Unspecified fracture of lower end of right femur, initial encounter for closed fracture: Secondary | ICD-10-CM | POA: Diagnosis not present

## 2022-12-13 DIAGNOSIS — J449 Chronic obstructive pulmonary disease, unspecified: Secondary | ICD-10-CM | POA: Diagnosis not present

## 2022-12-13 DIAGNOSIS — S82891A Other fracture of right lower leg, initial encounter for closed fracture: Secondary | ICD-10-CM | POA: Diagnosis not present

## 2022-12-13 DIAGNOSIS — I4891 Unspecified atrial fibrillation: Secondary | ICD-10-CM | POA: Diagnosis not present

## 2022-12-13 DIAGNOSIS — J069 Acute upper respiratory infection, unspecified: Secondary | ICD-10-CM | POA: Diagnosis not present

## 2022-12-18 DIAGNOSIS — T8149XA Infection following a procedure, other surgical site, initial encounter: Secondary | ICD-10-CM | POA: Diagnosis not present

## 2022-12-20 DIAGNOSIS — T8149XA Infection following a procedure, other surgical site, initial encounter: Secondary | ICD-10-CM | POA: Diagnosis not present

## 2022-12-20 DIAGNOSIS — I10 Essential (primary) hypertension: Secondary | ICD-10-CM | POA: Diagnosis not present

## 2022-12-20 DIAGNOSIS — J449 Chronic obstructive pulmonary disease, unspecified: Secondary | ICD-10-CM | POA: Diagnosis not present

## 2022-12-25 DIAGNOSIS — S72451D Displaced supracondylar fracture without intracondylar extension of lower end of right femur, subsequent encounter for closed fracture with routine healing: Secondary | ICD-10-CM | POA: Diagnosis not present

## 2022-12-25 DIAGNOSIS — S93431D Sprain of tibiofibular ligament of right ankle, subsequent encounter: Secondary | ICD-10-CM | POA: Diagnosis not present

## 2022-12-25 DIAGNOSIS — S82841D Displaced bimalleolar fracture of right lower leg, subsequent encounter for closed fracture with routine healing: Secondary | ICD-10-CM | POA: Diagnosis not present

## 2022-12-26 DIAGNOSIS — S82891A Other fracture of right lower leg, initial encounter for closed fracture: Secondary | ICD-10-CM | POA: Diagnosis not present

## 2022-12-26 DIAGNOSIS — S72401A Unspecified fracture of lower end of right femur, initial encounter for closed fracture: Secondary | ICD-10-CM | POA: Diagnosis not present

## 2022-12-26 DIAGNOSIS — Z7189 Other specified counseling: Secondary | ICD-10-CM | POA: Diagnosis not present

## 2022-12-26 DIAGNOSIS — T8149XA Infection following a procedure, other surgical site, initial encounter: Secondary | ICD-10-CM | POA: Diagnosis not present

## 2022-12-27 DIAGNOSIS — Z7409 Other reduced mobility: Secondary | ICD-10-CM | POA: Diagnosis not present

## 2022-12-27 DIAGNOSIS — S82891A Other fracture of right lower leg, initial encounter for closed fracture: Secondary | ICD-10-CM | POA: Diagnosis not present

## 2022-12-27 DIAGNOSIS — S72401A Unspecified fracture of lower end of right femur, initial encounter for closed fracture: Secondary | ICD-10-CM | POA: Diagnosis not present

## 2022-12-31 DIAGNOSIS — J449 Chronic obstructive pulmonary disease, unspecified: Secondary | ICD-10-CM | POA: Diagnosis not present

## 2022-12-31 DIAGNOSIS — I480 Paroxysmal atrial fibrillation: Secondary | ICD-10-CM | POA: Diagnosis not present

## 2023-01-02 DIAGNOSIS — T8149XA Infection following a procedure, other surgical site, initial encounter: Secondary | ICD-10-CM | POA: Diagnosis not present

## 2023-01-03 DIAGNOSIS — Z7409 Other reduced mobility: Secondary | ICD-10-CM | POA: Diagnosis not present

## 2023-01-03 DIAGNOSIS — S72401A Unspecified fracture of lower end of right femur, initial encounter for closed fracture: Secondary | ICD-10-CM | POA: Diagnosis not present

## 2023-01-03 DIAGNOSIS — I10 Essential (primary) hypertension: Secondary | ICD-10-CM | POA: Diagnosis not present

## 2023-01-03 DIAGNOSIS — J449 Chronic obstructive pulmonary disease, unspecified: Secondary | ICD-10-CM | POA: Diagnosis not present

## 2023-01-08 DIAGNOSIS — S82841D Displaced bimalleolar fracture of right lower leg, subsequent encounter for closed fracture with routine healing: Secondary | ICD-10-CM | POA: Diagnosis not present

## 2023-01-08 DIAGNOSIS — S72451D Displaced supracondylar fracture without intracondylar extension of lower end of right femur, subsequent encounter for closed fracture with routine healing: Secondary | ICD-10-CM | POA: Diagnosis not present

## 2023-01-08 DIAGNOSIS — S93431D Sprain of tibiofibular ligament of right ankle, subsequent encounter: Secondary | ICD-10-CM | POA: Diagnosis not present

## 2023-01-22 ENCOUNTER — Ambulatory Visit: Payer: Self-pay | Admitting: Student

## 2023-01-28 ENCOUNTER — Encounter (HOSPITAL_COMMUNITY): Payer: Self-pay | Admitting: Student

## 2023-01-28 NOTE — Progress Notes (Signed)
PCP - Dr Mirna Mires Cardiologist - Dr Carlyle Dolly EP - Dr Cristopher Peru  Chest x-ray - 12/06/22 (1V) EKG - 12/06/22 Stress Test - 12/06/04 (Neg) ECHO - 11/13/16 Cardiac Cath - n/a  ICD Pacemaker - Yes. St Jude PPM, VVIR mode single chamber.  PPM generator change out was on 08/14/18.  Last remote check was on 10/03/22.  Perioperative Cardiac Device Programming Orders initiated but the office was not able to sign.  Patient is past due in seeing MD.  [facility] notifying Rep. Aaron Edelman Small that he will be needed here on DOS.   Jody from [place] [facility] returned my call informing me that Aaron Edelman is not available and and that they will have someone here tomorrow around 7 AM.  Dianna in the OR at [place] desk was informed of the above information.    Sleep Study -  n/a CPAP - none  Diabetes -n/a  Blood Thinner Instructions:  Last dose of Eliquis was on 12/25/22.  Eliquis was D/C.  NPO  Anesthesia review: Yes  STOP now taking any Aspirin (unless otherwise instructed by your surgeon), Aleve, Naproxen, Ibuprofen, Motrin, Advil, Goody's, BC's, all herbal medications, fish oil, and all vitamins.   Coronavirus Screening Does the patient have any of the following symptoms:  Cough yes/no: No Fever (>100.4F)  yes/no: No Runny nose yes/no: No Sore throat yes/no: No Difficulty breathing/shortness of breath  Yes  Has the patient traveled in the last 14 days and where? yes/no: No  Verbally reviewed information and instructions for DOS.  Instructions were faxed to April Orthoptist) at [facility]. phone # [phone], fax [phone].

## 2023-01-29 ENCOUNTER — Encounter (HOSPITAL_COMMUNITY): Payer: Self-pay | Admitting: Student

## 2023-01-29 ENCOUNTER — Ambulatory Visit (HOSPITAL_COMMUNITY): Payer: Medicare Other | Admitting: Vascular Surgery

## 2023-01-29 NOTE — Progress Notes (Signed)
Anesthesia Chart Review: [name]  Case: [id number] Date/Time: 01/30/23 0815   Procedure: HARDWARE REMOVAL ANKLE (Right)   Anesthesia type: General   Pre-op diagnosis: Exposed ankle hardware   Location: [place] OR ROOM 03 / [place] OR   Surgeons: Shona Needles, MD       DISCUSSION: Patient is a 89 year old female scheduled for the above procedure. She fell while walking down stairs on 12/05/22. She sustained a angulated right distal fibular fracture and periprosthetic fracture of the distal femoral metaphysis. S/p ORIF right supracondylar distal femur fracture, ORIF right bimalleolar ankle fracture on 12/07/22. She received 1 unit PRBC for post-operative HGB 6.7. Eliquis was resumed. She had an incidental + COVID-19 test on 12/06/22 with elevated CRP and given one dose of IV steroids. She was discharged to [facility] on 12/11/22 for on-going rehabilitation.   Per H&P, at OTS clinic follow-up visit, she was noted to have irritation with a small open wound to the lateral incision. CAM boot was removed and treated with 2 weeks of oral antibiotics, but unfortunately had not healed within 4 weeks and had developed drainage with concern for infection. Hardware removal was recommended.   Other history includes never smoker, COPD, HTN, a-fib (DCCV 10/13/04, 01/11/05), SA node dysfunction (s/p dual chamber PPM 03/29/06, [employer]; generator change 08/14/18), spinal surgery (L1-3 laminectomy, right L2-sacrum decompression 05/31/05), osteoarthritis (right THA 1997, right TKA 09/28/99).  Last cardiology visit seen is from 01/09/22 with EP Dr. Lovena Le. PPM working normally. PPM programmed to VVIR. Had ventricular pacing on EKG. Afib controlled. Tolerating Eliquis. She did not appear volume overloaded. One year follow-up planned.   EP would not complete PPM Perioperative device form, since she was overdue for office follow-up. Last remote interrogation noted is from 10/03/22 with normal device function, longevity 7  years, 5 months. She has a [employer]72 [employer] MRI PPM. She had ventricular pacing on 01/09/22 EKG, but I do not appreciate pacer spike on 12/06/22 tracing. RN staff to notify [employer] Rep of surgery date and time.    Staff at [facility] said Eliquis was discontinued on 12/25/22.   She is a same day work-up, who resides at a SNF since her fall with RLE ORIF in 11/2022. Unfortunately, now with exposed hardware and concern for infection. Anesthesia team to evaluate on the day of surgery.     VS:  BP Readings from Last 3 Encounters:  12/11/22 (!) 155/64  09/12/22 116/62  07/26/22 112/62   Pulse Readings from Last 3 Encounters:  12/11/22 67  09/12/22 63  07/26/22 62     PROVIDERS: Susy Frizzle, MD is PCP  Cristopher Peru, MD is EP cardiologist, last visit 01/09/22.  Carlyle Dolly, MD is primary cardiologist. Last visit 07/06/21. Provider had to reschedule 02/09/22 visit, and then she missed her 03/21/22 visit.    LABS: For day of surgery as indicated. Last results in [facility] include: Lab Results  Component Value Date   WBC 6.2 12/11/2022   HGB 8.0 (L) 12/11/2022   HCT 23.8 (L) 12/11/2022   PLT 175 12/11/2022   GLUCOSE 90 12/10/2022   ALT 10 12/10/2022   AST 36 12/10/2022   NA 133 (L) 12/10/2022   K 3.7 12/10/2022   CL 97 (L) 12/10/2022   CREATININE 0.72 12/10/2022   BUN 30 (H) 12/10/2022   CO2 24 12/10/2022   INR 1.2 12/06/2022     IMAGES: CT head & C-spine 12/06/22: IMPRESSION: 1. No  evidence of significant acute traumatic injury to the skull, brain or cervical spine. 2. Moderate cerebral and mild cerebellar atrophy with extensive chronic microvascular ischemic changes in the cerebral white matter, multiple old lacunar infarcts throughout the basal ganglia bilaterally, old right cerebellar infarcts, and old right MCA/PCA territory infarct, as detailed above. 3. Severe multilevel degenerative disc disease and cervical spondylosis.   PCXR  12/06/22: IMPRESSION: 1. No definite radiographic evidence of acute cardiopulmonary disease. The appearance of the lungs suggests chronic bronchitis, similar to prior examination. 2. Cardiomegaly. 3. Aortic atherosclerosis.   EKG: 12/06/22: Atrial fibrillation at 82 bpm LVH with secondary repolarization abnormality Probable anterior infarct, age indeterminate Confirmed by Ripley Fraise ([id number]) on 12/06/2022 5:43:36 AM   CV: US Carotid 11/14/16: Summary:  Findings are consistent with a 1-39 percent stenosis involving the  right internal carotid artery and the left internal carotid artery.  The vertebral arteries demonstrate antegrade flow.    Echo 11/13/16: Study Conclusions  - Left ventricle: The cavity size was normal. Wall thickness was    increased in a pattern of mild LVH. Indeterminant diastolic    function (atrial fibrillation). Systolic function was normal. The    estimated ejection fraction was in the range of 55% to 60%. Wall    motion was normal; there were no regional wall motion    abnormalities.  - Aortic valve: There was no stenosis.  - Mitral valve: Mildly calcified annulus. There was trivial    regurgitation.  - Left atrium: The atrium was severely dilated.  - Right ventricle: The cavity size was normal. Pacer wire or    catheter noted in right ventricle. Systolic function was mildly    reduced.  - Tricuspid valve: Peak RV-RA gradient (S): 35 mm Hg.  - Pulmonary arteries: PA peak pressure: 43 mm Hg (S).  - Systemic veins: IVC measured 2.3 cm with > 50% respirophasic    variation, suggesting RA pressure 8 mmHg.   Impressions:  - The patient was in atrial fibrillation. Normal LV size with mild    LV hypertrophy. EF 55-60%. Normal RV size with mildly decreased    systolic function. No significant valvular abnormalities. Severe    left atrial enlargement.    By prior notes in [facility], She had a negative Myoview stress test on 12/06/04.    Past Medical History:   Diagnosis Date   Arthritis    Atrial fibrillation (HCC)    COPD (chronic obstructive pulmonary disease) (HCC)    Dysrhythmia    a-fib   Hip pain, left    Hypertension    Sinoatrial node dysfunction (HCC)     Past Surgical History:  Procedure Laterality Date   BACK SURGERY     EYE SURGERY Bilateral    cataract removal   hip arthoplasty     total   KNEE ARTHROPLASTY     ORIF ANKLE FRACTURE Right 12/07/2022   Procedure: OPEN REDUCTION INTERNAL FIXATION (ORIF) ANKLE FRACTURE;  Surgeon: Shona Needles, MD;  Location: [place];  Service: Orthopedics;  Laterality: Right;   ORIF FEMUR FRACTURE Right 12/07/2022   Procedure: OPEN REDUCTION INTERNAL FIXATION (ORIF) DISTAL FEMUR;  Surgeon: Shona Needles, MD;  Location: [place];  Service: Orthopedics;  Laterality: Right;   PPM GENERATOR CHANGEOUT N/A 08/14/2018   Procedure: PPM GENERATOR CHANGEOUT;  Surgeon: Evans Lance, MD;  Location: [place] CV LAB;  Service: Cardiovascular;  Laterality: N/A;   uterine polyp removal      MEDICATIONS: No current facility-administered  medications for this encounter.    apixaban (ELIQUIS) 2.5 MG TABS tablet   docusate sodium (COLACE) 100 MG capsule   furosemide (LASIX) 40 MG tablet   Iron, Ferrous Sulfate, 325 (65 Fe) MG TABS   lisinopril (ZESTRIL) 40 MG tablet   metoprolol tartrate (LOPRESSOR) 25 MG tablet   Multiple Vitamin (MULTIVITAMIN) tablet   vitamin D3 (CHOLECALCIFEROL) 25 MCG tablet    Myra Gianotti, PA-C Surgical Short Stay/Anesthesiology [facility] Phone [phone] [facility] Phone [phone] 01/29/2023 12:37 PM

## 2023-01-29 NOTE — Anesthesia Preprocedure Evaluation (Deleted)
Anesthesia Evaluation  Patient identified by MRN, date of birth, ID band Patient awake    Reviewed: Allergy & Precautions, NPO status , Patient's Chart, lab work & pertinent test results  Airway Mallampati: II  TM Distance: >3 FB Neck ROM: Full    Dental  (+) Missing, Poor Dentition, Chipped   Pulmonary shortness of breath and at rest, COPD,  COPD inhaler, Patient abstained from smoking. Covid 19 +   breath sounds clear to auscultation+ rhonchi  + decreased breath sounds+ wheezing      Cardiovascular hypertension, Pt. on medications + dysrhythmias Atrial Fibrillation + pacemaker  Rhythm:Regular Rate:Normal  SA node dysfunction S/P PPP  EKG 12/06/22 Atrial fibrillation, anterior infarct, LVH with repol abnormality , inverted T wave V3-6  Echo 2018 Left ventricle: The cavity size was normal. Wall thickness was    increased in a pattern of mild LVH. Indeterminant diastolic    function (atrial fibrillation). Systolic function was normal. The    estimated ejection fraction was in the range of 55% to 60%. Wall    motion was normal; there were no regional wall motion    abnormalities.  - Aortic valve: There was no stenosis.  - Mitral valve: Mildly calcified annulus. There was trivial    regurgitation.  - Left atrium: The atrium was severely dilated.  - Right ventricle: The cavity size was normal. Pacer wire or    catheter noted in right ventricle. Systolic function was mildly    reduced.  - Tricuspid valve: Peak RV-RA gradient (S): 35 mm Hg.  - Pulmonary arteries: PA peak pressure: 43 mm Hg (S).  - Systemic veins: IVC measured 2.3 cm with > 50% respirophasic    variation, suggesting RA pressure 8 mmHg.     Neuro/Psych Macular degeneration  negative psych ROS   GI/Hepatic negative GI ROS, Neg liver ROS,,,  Endo/Other  Hyperlipidemia  Renal/GU negative Renal ROS     Musculoskeletal  (+) Arthritis , Osteoarthritis,   Right periprosthetic distal femur fracture  Bimalleolar Fx right ankle   Abdominal   Peds  Hematology  (+) Blood dyscrasia, anemia Eliquis therapy-last dose 1/24 Thrombocytopenia- Plt  79k   Anesthesia Other Findings   Reproductive/Obstetrics Hx/o endometrial Ca 2015 S/P uterine polypectomy                              Anesthesia Physical Anesthesia Plan  ASA: 4  Anesthesia Plan: General   Post-op Pain Management: Tylenol PO (pre-op)*   Induction: Intravenous  PONV Risk Score and Plan: 3 and Treatment may vary due to age or medical condition, Ondansetron and Dexamethasone  Airway Management Planned:   Additional Equipment:   Intra-op Plan:   Post-operative Plan:   Informed Consent: I have reviewed the patients History and Physical, chart, labs and discussed the procedure including the risks, benefits and alternatives for the proposed anesthesia with the patient or authorized representative who has indicated his/her understanding and acceptance.     Dental advisory given  Plan Discussed with: CRNA  Anesthesia Plan Comments: (PAT note written 01/29/2023 by Myra Gianotti, PA-C.  )         Anesthesia Quick Evaluation

## 2023-01-29 NOTE — H&P (Signed)
Orthopaedic Trauma Service (OTS) H&P  Patient ID: [name] MRN: [id number] DOB/AGE: Jun 11, 1923 89 y.o.  Reason for Surgery: Postoperative wound infection right ankle  HPI: [name] is an 89 y.o. female presenting for hardware removal from right ankle.  Patient sustained a fall in January 2024, resulting in right ankle fracture as well as right periprosthetic distal femur fracture.  Patient underwent ORIF of both the distal femur and right bimalleolar ankle fracture by Dr. Doreatha Martin on 12/07/2022.  Patient was placed in a cam walking boot postoperatively.  When seen in follow-up in OTS clinic, patient noted to have irritation and a small open wound to the lateral incision. It was felt this wound was a result of pressure coming from the CAM boot.  She was instructed to come out of the boot and leave her incisions open to air.  Over the last 4 weeks, the wound to the lateral malleolus has unfortunately not healed and is now draining, with concern for infection.  She has ben on oral antibiotics x 2 weeks with no resolution/improvement in the appearance of the wound.  She presents now for hardware removal.  Past Medical History:  Diagnosis Date   Arthritis    Atrial fibrillation (HCC)    COPD (chronic obstructive pulmonary disease) (HCC)    Dysrhythmia    a-fib   Hip pain, left    Hypertension    Sinoatrial node dysfunction (HCC)     Past Surgical History:  Procedure Laterality Date   BACK SURGERY     EYE SURGERY Bilateral    cataract removal   hip arthoplasty     total   KNEE ARTHROPLASTY     ORIF ANKLE FRACTURE Right 12/07/2022   Procedure: OPEN REDUCTION INTERNAL FIXATION (ORIF) ANKLE FRACTURE;  Surgeon: Shona Needles, MD;  Location: [place];  Service: Orthopedics;  Laterality: Right;   ORIF FEMUR FRACTURE Right 12/07/2022   Procedure: OPEN REDUCTION INTERNAL FIXATION (ORIF) DISTAL FEMUR;  Surgeon: Shona Needles, MD;  Location: [place];  Service: Orthopedics;  Laterality: Right;    PPM GENERATOR CHANGEOUT N/A 08/14/2018   Procedure: PPM GENERATOR CHANGEOUT;  Surgeon: Evans Lance, MD;  Location: [place] CV LAB;  Service: Cardiovascular;  Laterality: N/A;   uterine polyp removal      Family History  Problem Relation Age of Onset   Prostate cancer Father    Heart failure Father    Heart disease Father    Stroke Sister    Depression Maternal Grandmother     Social History:  reports that she has never smoked. She has never used smokeless tobacco. She reports that she does not drink alcohol and does not use drugs.  Allergies: No Known Allergies  Medications: I have reviewed the patient's current medications. Prior to Admission:  No medications prior to admission.    ROS: Constitutional: No fever or chills Vision: No changes in vision ENT: No difficulty swallowing CV: No chest pain Pulm: No SOB or wheezing GI: No nausea or vomiting GU: No urgency or inability to hold urine Skin: No poor wound healing Neurologic: No numbness or tingling Psychiatric: No depression or anxiety Heme: No bruising Allergic: No reaction to medications or food   Exam: There were no vitals taken for this visit. General: No acute distress Orientation: Alert and oriented x 3 Mood and Affect: Mood and affect appropriate, pleasant and cooperative Gait: Able to weight-bear for transfers with assistance Coordination and balance: Within normal limits  Right lower  extremity: Well-healed surgical incision to the medial malleolus.  Wound over the lateral malleolus.  Remainder of lateral incision well-healed.  There is no active drainage from the wound on the lateral ankle but there is some residue on patient's sock.  No obviously exposed hardware noted.  Endorses sensation over all aspects of the foot.  Tolerates gentle ankle range of motion.  Toes warm well-perfused.  Left lower extremity: Skin without lesions. No tenderness to palpation. Full painless ROM, full strength in each  muscle groups without evidence of instability.   Medical Decision Making: Data: Imaging: AP, lateral, mortise view of the right ankle shows stable appearance to the fracture  Labs: No results found for this or any previous visit (from the past 168 hour(s)).   Assessment/Plan: 89 year old female s/p ORIF R ankle fracture 12/07/22  The wound to the lateral aspect of the ankle continues to be open and have drainage, with obvious concern for infection.  She has been on antibiotics x 2 weeks with minimal resolution.  At this point, I would recommend proceeding with hardware removal from the right ankle. Without removal of hardware, I do not think this would will heal on its own given patient's skin integrity. Risks and benefits of the procedure have been discussed with the patient and her son. Risks discussed included bleeding, continued infection, malunion, nonunion, damage to surrounding nerves and blood vessels, pain, stiffness, post-traumatic arthritis, DVT/PE, compartment syndrome, and even anesthesia complications. The patient and her son agree to proceed with surgery.  Will plan to admit the patient overnight for observation, pain control, therapies before discharging her back to her rehab facility.Gwinda Passe PA-C [facility] [phone] (office) [email or web address]

## 2023-01-29 NOTE — Progress Notes (Signed)
Surgical Instructions for [name] Date of Surgery 01/30/23 from 830 AM - 9:34 AM    Your procedure is scheduled on Wednesday, 01/30/23.  Report to [facility] Main Entrance "A" at 6 A.M., then check in with the Admitting office.  Call this number if you have problems the morning of surgery:  [phone]   If you have any questions prior to your surgery date call [phone]: Open Monday-Friday 8am-4pm If you experience any cold or flu symptoms such as cough, fever, chills, shortness of breath, etc. between now and your scheduled surgery, please notify us at the above number     Remember:  Do not eat or drink after midnight tonight-Tuesday.    Take these medicines the morning of surgery with A SIP OF WATER: Metoprolol   As of today, STOP taking any Aspirin (unless otherwise instructed by your surgeon) Aleve, Naproxen, Ibuprofen, Motrin, Advil, Goody's, BC's, all herbal medications, fish oil, and all vitamins.           Do not wear jewelry or makeup. Do not wear lotions, powders, perfumes or deodorant. Do not shave 48 hours prior to surgery.   Do not bring valuables to the hospital. Do not wear nail polish, gel polish, artificial nails, or any other type of covering on natural nails (fingers and toes) If you have artificial nails or gel coating that need to be removed by a nail salon, please have this removed prior to surgery. Artificial nails or gel coating may interfere with anesthesia's ability to adequately monitor your vital signs.  [facility] is not responsible for any belongings or valuables.    Contacts, glasses, hearing aids, dentures or partials may not be worn into surgery, please bring cases for these belongings   For patients admitted to the hospital, discharge time will be determined by your treatment team.    [place] VISITATION Patients having surgery or a procedure may have no more than 2 support people in the waiting area - these visitors may  rotate.   Children under the age of 20 must have an adult with them who is not the patient. If the patient needs to stay at the hospital during part of their recovery, the visitor guidelines for inpatient rooms apply. Pre-op nurse will coordinate an appropriate time for 1 support person to accompany patient in pre-op.  This support person may not rotate.   Please refer to [email or web address] for the visitor guidelines for Inpatients (after your surgery is over and you are in a regular room).    Special instructions:    Oral Hygiene is also important to reduce your risk of infection.  Remember - BRUSH YOUR TEETH THE MORNING OF SURGERY WITH YOUR REGULAR TOOTHPASTE

## 2023-01-30 ENCOUNTER — Inpatient Hospital Stay (HOSPITAL_COMMUNITY): Payer: Medicare Other

## 2023-01-30 ENCOUNTER — Ambulatory Visit (HOSPITAL_COMMUNITY): Payer: Medicare Other

## 2023-01-30 ENCOUNTER — Other Ambulatory Visit: Payer: Self-pay

## 2023-01-30 ENCOUNTER — Encounter (HOSPITAL_COMMUNITY)
Admission: AD | Disposition: A | Discharge status: Skilled Nursing Facility | Payer: Self-pay | Source: Ambulatory Visit | Attending: Internal Medicine

## 2023-01-30 ENCOUNTER — Encounter (HOSPITAL_COMMUNITY): Payer: Self-pay | Admitting: Student

## 2023-01-30 ENCOUNTER — Inpatient Hospital Stay (HOSPITAL_COMMUNITY)
Admission: AD | Admit: 2023-01-30 | Discharge: 2023-02-05 | DRG: 857 | Disposition: A | Discharge status: Skilled Nursing Facility | Payer: Medicare Other | Source: Ambulatory Visit | Attending: Internal Medicine | Admitting: Internal Medicine

## 2023-01-30 DIAGNOSIS — I480 Paroxysmal atrial fibrillation: Secondary | ICD-10-CM | POA: Diagnosis present

## 2023-01-30 DIAGNOSIS — T8149XA Infection following a procedure, other surgical site, initial encounter: Secondary | ICD-10-CM | POA: Diagnosis present

## 2023-01-30 DIAGNOSIS — I495 Sick sinus syndrome: Secondary | ICD-10-CM | POA: Diagnosis present

## 2023-01-30 DIAGNOSIS — Z66 Do not resuscitate: Secondary | ICD-10-CM | POA: Diagnosis present

## 2023-01-30 DIAGNOSIS — Z8249 Family history of ischemic heart disease and other diseases of the circulatory system: Secondary | ICD-10-CM

## 2023-01-30 DIAGNOSIS — T8141XA Infection following a procedure, superficial incisional surgical site, initial encounter: Principal | ICD-10-CM | POA: Diagnosis present

## 2023-01-30 DIAGNOSIS — D62 Acute posthemorrhagic anemia: Secondary | ICD-10-CM | POA: Diagnosis not present

## 2023-01-30 DIAGNOSIS — J939 Pneumothorax, unspecified: Secondary | ICD-10-CM | POA: Diagnosis present

## 2023-01-30 DIAGNOSIS — I4891 Unspecified atrial fibrillation: Secondary | ICD-10-CM | POA: Diagnosis present

## 2023-01-30 DIAGNOSIS — Z8042 Family history of malignant neoplasm of prostate: Secondary | ICD-10-CM | POA: Diagnosis not present

## 2023-01-30 DIAGNOSIS — Z950 Presence of cardiac pacemaker: Secondary | ICD-10-CM | POA: Diagnosis not present

## 2023-01-30 DIAGNOSIS — Z01818 Encounter for other preprocedural examination: Secondary | ICD-10-CM

## 2023-01-30 DIAGNOSIS — Z96649 Presence of unspecified artificial hip joint: Secondary | ICD-10-CM | POA: Diagnosis present

## 2023-01-30 DIAGNOSIS — E871 Hypo-osmolality and hyponatremia: Secondary | ICD-10-CM | POA: Diagnosis not present

## 2023-01-30 DIAGNOSIS — Z7901 Long term (current) use of anticoagulants: Secondary | ICD-10-CM

## 2023-01-30 DIAGNOSIS — D649 Anemia, unspecified: Secondary | ICD-10-CM | POA: Diagnosis present

## 2023-01-30 DIAGNOSIS — Z993 Dependence on wheelchair: Secondary | ICD-10-CM

## 2023-01-30 DIAGNOSIS — J449 Chronic obstructive pulmonary disease, unspecified: Secondary | ICD-10-CM | POA: Diagnosis present

## 2023-01-30 DIAGNOSIS — I10 Essential (primary) hypertension: Secondary | ICD-10-CM | POA: Diagnosis present

## 2023-01-30 DIAGNOSIS — J9383 Other pneumothorax: Secondary | ICD-10-CM | POA: Diagnosis present

## 2023-01-30 DIAGNOSIS — Z8616 Personal history of COVID-19: Secondary | ICD-10-CM

## 2023-01-30 DIAGNOSIS — Z96659 Presence of unspecified artificial knee joint: Secondary | ICD-10-CM | POA: Diagnosis present

## 2023-01-30 DIAGNOSIS — J479 Bronchiectasis, uncomplicated: Secondary | ICD-10-CM | POA: Diagnosis present

## 2023-01-30 DIAGNOSIS — Z79899 Other long term (current) drug therapy: Secondary | ICD-10-CM

## 2023-01-30 DIAGNOSIS — Z818 Family history of other mental and behavioral disorders: Secondary | ICD-10-CM | POA: Diagnosis not present

## 2023-01-30 DIAGNOSIS — T84196A Other mechanical complication of internal fixation device of bone of right lower leg, initial encounter: Secondary | ICD-10-CM | POA: Diagnosis not present

## 2023-01-30 DIAGNOSIS — Z823 Family history of stroke: Secondary | ICD-10-CM | POA: Diagnosis not present

## 2023-01-30 HISTORY — DX: Anemia, unspecified: D64.9

## 2023-01-30 HISTORY — DX: Dependence on wheelchair: Z99.3

## 2023-01-30 HISTORY — DX: Cardiac arrhythmia, unspecified: I49.9

## 2023-01-30 LAB — C-REACTIVE PROTEIN: CRP: 3.6 mg/dL — ABNORMAL HIGH (ref <1.0)

## 2023-01-30 LAB — CBC
HCT: 36.1 % (ref 36.0–46.0)
Hemoglobin: 11 g/dL — ABNORMAL LOW (ref 12.0–15.0)
MCH: 32.5 pg (ref 26.0–34.0)
MCHC: 30.5 g/dL (ref 30.0–36.0)
MCV: 106.8 fL — ABNORMAL HIGH (ref 80.0–100.0)
Platelets: 210 10*3/uL (ref 150–400)
RBC: 3.38 MIL/uL — ABNORMAL LOW (ref 3.87–5.11)
RDW: 15.6 % — ABNORMAL HIGH (ref 11.5–15.5)
WBC: 8.2 10*3/uL (ref 4.0–10.5)
nRBC: 0 % (ref 0.0–0.2)

## 2023-01-30 LAB — BASIC METABOLIC PANEL
Anion gap: 7 (ref 5–15)
BUN: 14 mg/dL (ref 8–23)
CO2: 31 mmol/L (ref 22–32)
Calcium: 8.2 mg/dL — ABNORMAL LOW (ref 8.9–10.3)
Chloride: 97 mmol/L — ABNORMAL LOW (ref 98–111)
Creatinine, Ser: 0.51 mg/dL (ref 0.44–1.00)
GFR, Estimated: >60 mL/min (ref >60)
Glucose, Bld: 117 mg/dL — ABNORMAL HIGH (ref 70–99)
Potassium: 3.8 mmol/L (ref 3.5–5.1)
Sodium: 135 mmol/L (ref 135–145)

## 2023-01-30 LAB — SAMPLE TO BLOOD BANK

## 2023-01-30 SURGERY — REMOVAL, HARDWARE
Anesthesia: General

## 2023-01-30 MED ORDER — ALBUTEROL SULFATE (2.5 MG/3ML) 0.083% IN NEBU
INHALATION_SOLUTION | RESPIRATORY_TRACT | Status: AC
  Administered 2023-01-30: 2.5 mg
  Filled 2023-01-30: qty 3

## 2023-01-30 MED ORDER — DOCUSATE SODIUM 100 MG PO CAPS: 100.0000 mg | ORAL_CAPSULE | Freq: Two times a day (BID) | ORAL | Status: DC | PRN

## 2023-01-30 MED ORDER — ONDANSETRON HCL 4 MG/2ML IJ SOLN
INTRAMUSCULAR | Status: AC
  Filled 2023-01-30: qty 2

## 2023-01-30 MED ORDER — ONDANSETRON HCL 4 MG PO TABS: 4.0000 mg | ORAL_TABLET | Freq: Four times a day (QID) | ORAL | Status: DC | PRN

## 2023-01-30 MED ORDER — LIDOCAINE 2% (20 MG/ML) 5 ML SYRINGE
INTRAMUSCULAR | Status: AC
  Filled 2023-01-30: qty 5

## 2023-01-30 MED ORDER — PROPOFOL 1000 MG/100ML IV EMUL
INTRAVENOUS | Status: AC
  Filled 2023-01-30: qty 100

## 2023-01-30 MED ORDER — ONDANSETRON HCL 4 MG/2ML IJ SOLN: 4.0000 mg | Freq: Four times a day (QID) | INTRAMUSCULAR | Status: DC | PRN

## 2023-01-30 MED ORDER — EPHEDRINE 5 MG/ML INJ
INTRAVENOUS | Status: AC
  Filled 2023-01-30: qty 5

## 2023-01-30 MED ORDER — PROPOFOL 10 MG/ML IV BOLUS
INTRAVENOUS | Status: AC
  Filled 2023-01-30: qty 20

## 2023-01-30 MED ORDER — LISINOPRIL 20 MG PO TABS
40.0000 mg | ORAL_TABLET | Freq: Every day | ORAL | Status: DC
  Administered 2023-01-30 – 2023-02-05 (×7): 40 mg via ORAL
  Filled 2023-01-30 (×7): qty 2

## 2023-01-30 MED ORDER — MUPIROCIN 2 % EX OINT
1.0000 | TOPICAL_OINTMENT | Freq: Two times a day (BID) | CUTANEOUS | Status: AC
  Administered 2023-01-30 – 2023-02-04 (×10): 1 via NASAL
  Filled 2023-01-30 (×2): qty 22

## 2023-01-30 MED ORDER — DEXAMETHASONE SODIUM PHOSPHATE 10 MG/ML IJ SOLN
INTRAMUSCULAR | Status: AC
  Filled 2023-01-30: qty 1

## 2023-01-30 MED ORDER — PHENYLEPHRINE 80 MCG/ML (10ML) SYRINGE FOR IV PUSH (FOR BLOOD PRESSURE SUPPORT)
PREFILLED_SYRINGE | INTRAVENOUS | Status: AC
  Filled 2023-01-30: qty 10

## 2023-01-30 MED ORDER — SACCHAROMYCES BOULARDII 250 MG PO CAPS
250.0000 mg | ORAL_CAPSULE | Freq: Two times a day (BID) | ORAL | Status: DC
  Administered 2023-01-30 – 2023-02-05 (×12): 250 mg via ORAL
  Filled 2023-01-30 (×12): qty 1

## 2023-01-30 MED ORDER — ACETAMINOPHEN 325 MG PO TABS
650.0000 mg | ORAL_TABLET | Freq: Four times a day (QID) | ORAL | Status: DC | PRN
  Administered 2023-02-01 – 2023-02-04 (×3): 650 mg via ORAL
  Filled 2023-01-30 (×3): qty 2

## 2023-01-30 MED ORDER — METOPROLOL TARTRATE 25 MG PO TABS
25.0000 mg | ORAL_TABLET | Freq: Two times a day (BID) | ORAL | Status: DC
  Administered 2023-01-30 – 2023-02-05 (×12): 25 mg via ORAL
  Filled 2023-01-30 (×12): qty 1

## 2023-01-30 MED ORDER — CEFAZOLIN SODIUM-DEXTROSE 2-4 GM/100ML-% IV SOLN
2.0000 g | INTRAVENOUS | Status: DC
  Filled 2023-01-30: qty 100

## 2023-01-30 MED ORDER — CHLORHEXIDINE GLUCONATE 0.12 % MT SOLN
15.0000 mL | Freq: Once | OROMUCOSAL | Status: AC
  Administered 2023-01-30: 15 mL via OROMUCOSAL
  Filled 2023-01-30: qty 15

## 2023-01-30 MED ORDER — ORAL CARE MOUTH RINSE: 15.0000 mL | Freq: Once | OROMUCOSAL | Status: AC

## 2023-01-30 MED ORDER — CEFAZOLIN SODIUM-DEXTROSE 1-4 GM/50ML-% IV SOLN
1.0000 g | Freq: Three times a day (TID) | INTRAVENOUS | Status: DC
  Administered 2023-01-30 – 2023-02-01 (×6): 1 g via INTRAVENOUS
  Filled 2023-01-30 (×6): qty 50

## 2023-01-30 MED ORDER — ACETAMINOPHEN 650 MG RE SUPP: 650.0000 mg | Freq: Four times a day (QID) | RECTAL | Status: DC | PRN

## 2023-01-30 MED ORDER — LACTATED RINGERS IV SOLN: INTRAVENOUS | Status: DC

## 2023-01-30 MED ORDER — ACETAMINOPHEN 500 MG PO TABS
1000.0000 mg | ORAL_TABLET | Freq: Once | ORAL | Status: AC
  Administered 2023-01-30: 1000 mg via ORAL
  Filled 2023-01-30: qty 2

## 2023-01-30 MED ORDER — ALBUTEROL SULFATE (2.5 MG/3ML) 0.083% IN NEBU: 2.5000 mg | INHALATION_SOLUTION | RESPIRATORY_TRACT | Status: DC | PRN

## 2023-01-30 MED ORDER — SODIUM CHLORIDE 0.9% FLUSH
3.0000 mL | Freq: Two times a day (BID) | INTRAVENOUS | Status: DC
  Administered 2023-01-30 – 2023-02-05 (×11): 3 mL via INTRAVENOUS

## 2023-01-30 SURGICAL SUPPLY — 63 items
APL PRP STRL LF DISP 70% ISPRP (MISCELLANEOUS) ×2
BAG COUNTER SPONGE SURGICOUNT (BAG) ×2 IMPLANT
BAG SPNG CNTER NS LX DISP (BAG) ×1
BANDAGE ESMARK 6X9 LF (GAUZE/BANDAGES/DRESSINGS) ×2 IMPLANT
BNDG CMPR 5X6 CHSV STRCH STRL (GAUZE/BANDAGES/DRESSINGS) ×1
BNDG CMPR 9X6 STRL LF SNTH (GAUZE/BANDAGES/DRESSINGS) ×1
BNDG COHESIVE 6X5 TAN ST LF (GAUZE/BANDAGES/DRESSINGS) ×2 IMPLANT
BNDG ELASTIC 4X5.8 VLCR STR LF (GAUZE/BANDAGES/DRESSINGS) ×2 IMPLANT
BNDG ELASTIC 6X5.8 VLCR STR LF (GAUZE/BANDAGES/DRESSINGS) ×2 IMPLANT
BNDG ESMARK 6X9 LF (GAUZE/BANDAGES/DRESSINGS) ×1
BNDG GAUZE DERMACEA FLUFF 4 (GAUZE/BANDAGES/DRESSINGS) ×2 IMPLANT
BNDG GZE DERMACEA 4 6PLY (GAUZE/BANDAGES/DRESSINGS)
BRUSH SCRUB EZ PLAIN DRY (MISCELLANEOUS) ×4 IMPLANT
CHLORAPREP W/TINT 26 (MISCELLANEOUS) ×3 IMPLANT
COVER SURGICAL LIGHT HANDLE (MISCELLANEOUS) ×4 IMPLANT
CUFF TOURN SGL QUICK 18X4 (TOURNIQUET CUFF) IMPLANT
CUFF TOURN SGL QUICK 24 (TOURNIQUET CUFF)
CUFF TOURN SGL QUICK 34 (TOURNIQUET CUFF)
CUFF TRNQT CYL 24X4X16.5-23 (TOURNIQUET CUFF) IMPLANT
CUFF TRNQT CYL 34X4.125X (TOURNIQUET CUFF) IMPLANT
DRAPE C-ARM 42X72 X-RAY (DRAPES) IMPLANT
DRAPE C-ARMOR (DRAPES) ×2 IMPLANT
DRAPE U-SHAPE 47X51 STRL (DRAPES) ×2 IMPLANT
DRSG ADAPTIC 3X8 NADH LF (GAUZE/BANDAGES/DRESSINGS) ×1 IMPLANT
ELECT REM PT RETURN 9FT ADLT (ELECTROSURGICAL) ×1
ELECTRODE REM PT RTRN 9FT ADLT (ELECTROSURGICAL) ×2 IMPLANT
GAUZE SPONGE 4X4 12PLY STRL (GAUZE/BANDAGES/DRESSINGS) ×2 IMPLANT
GLOVE BIO SURGEON STRL SZ 6.5 (GLOVE) ×6 IMPLANT
GLOVE BIO SURGEON STRL SZ7.5 (GLOVE) ×8 IMPLANT
GLOVE BIOGEL PI IND STRL 6.5 (GLOVE) ×2 IMPLANT
GLOVE BIOGEL PI IND STRL 7.5 (GLOVE) ×2 IMPLANT
GOWN STRL REUS W/ TWL LRG LVL3 (GOWN DISPOSABLE) ×4 IMPLANT
GOWN STRL REUS W/TWL LRG LVL3 (GOWN DISPOSABLE) ×2
KIT BASIN OR (CUSTOM PROCEDURE TRAY) ×2 IMPLANT
KIT TURNOVER KIT B (KITS) ×2 IMPLANT
MANIFOLD NEPTUNE II (INSTRUMENTS) ×2 IMPLANT
NDL 22X1.5 STRL (OR ONLY) (MISCELLANEOUS) IMPLANT
NEEDLE 22X1.5 STRL (OR ONLY) (MISCELLANEOUS) IMPLANT
NS IRRIG 1000ML POUR BTL (IV SOLUTION) ×2 IMPLANT
PACK ORTHO EXTREMITY (CUSTOM PROCEDURE TRAY) ×2 IMPLANT
PAD ARMBOARD 7.5X6 YLW CONV (MISCELLANEOUS) ×4 IMPLANT
PADDING CAST COTTON 6X4 STRL (CAST SUPPLIES) ×6 IMPLANT
SPONGE T-LAP 18X18 ~~LOC~~+RFID (SPONGE) ×2 IMPLANT
STAPLER VISISTAT 35W (STAPLE) IMPLANT
STOCKINETTE IMPERVIOUS LG (DRAPES) ×2 IMPLANT
STRIP CLOSURE SKIN 1/2X4 (GAUZE/BANDAGES/DRESSINGS) IMPLANT
SUCTION FRAZIER HANDLE 10FR (MISCELLANEOUS)
SUCTION TUBE FRAZIER 10FR DISP (MISCELLANEOUS) IMPLANT
SUT ETHILON 3 0 PS 1 (SUTURE) IMPLANT
SUT MNCRL AB 3-0 PS2 18 (SUTURE) ×2 IMPLANT
SUT MON AB 2-0 CT1 36 (SUTURE) ×2 IMPLANT
SUT PDS AB 2-0 CT1 27 (SUTURE) IMPLANT
SUT VIC AB 0 CT1 27 (SUTURE)
SUT VIC AB 0 CT1 27XBRD ANBCTR (SUTURE) IMPLANT
SUT VIC AB 2-0 CT1 27 (SUTURE)
SUT VIC AB 2-0 CT1 TAPERPNT 27 (SUTURE) IMPLANT
SYR CONTROL 10ML LL (SYRINGE) IMPLANT
TOWEL GREEN STERILE (TOWEL DISPOSABLE) ×4 IMPLANT
TOWEL GREEN STERILE FF (TOWEL DISPOSABLE) ×4 IMPLANT
TUBE CONNECTING 12X1/4 (SUCTIONS) ×2 IMPLANT
UNDERPAD 30X36 HEAVY ABSORB (UNDERPADS AND DIAPERS) ×2 IMPLANT
WATER STERILE IRR 1000ML POUR (IV SOLUTION) ×3 IMPLANT
YANKAUER SUCT BULB TIP NO VENT (SUCTIONS) ×2 IMPLANT

## 2023-01-30 NOTE — Progress Notes (Signed)
Report attempted to receiving floor nurse. Floor nurse unavailable at this time.

## 2023-01-30 NOTE — TOC Initial Note (Addendum)
Transition of Care [facility]) - Initial/Assessment Note    Patient Details  Name: [name] MRN: [id number] Date of Birth: 07/12/1923  [facility]) CM/SW Contact:    Sharin Mons, RN Phone Number: 01/30/2023, 3:22 PM  Clinical Narrative:                 Received from [facility] SNF.  Pt with R ankle postoperative  wound infection... plan: hardware removal.  Pt presents with SOB/ dyspneic. Found to have right pneumothorax , s/p tube thoracostomy--right, 3/20.   Ankle hardware removal pending by ortho....  NCM spoke with son regarding d/c planning needs. Son states pt will transition back to a SNF( [facility]), not [facility]. States he has paid for a month stay @ [facility], CSW made aware.  TOC team following and will assist with needs.  Expected Discharge Plan: Skilled Nursing Facility Barriers to Discharge: Continued Medical Work up   Patient Goals and CMS Choice     Choice offered to / list presented to : Adult Children      Expected Discharge Plan and Services                                              Prior Living Arrangements/Services     Patient language and need for interpreter reviewed:: Yes        Need for Family Participation in Patient Care: Yes (Comment) Care giver support system in place?: No (comment)   Criminal Activity/Legal Involvement Pertinent to Current Situation/Hospitalization: No - Comment as needed  Activities of Daily Living Home Assistive Devices/Equipment: Eyeglasses, Environmental consultant (specify type), Wheelchair ADL Screening (condition at time of admission) Patient's cognitive ability adequate to safely complete daily activities?: Yes Is the patient deaf or have difficulty hearing?: Yes Does the patient have difficulty seeing, even when wearing glasses/contacts?: No Does the patient have difficulty concentrating, remembering, or making decisions?: Yes Patient able to express need for assistance with  ADLs?: Yes Does the patient have difficulty dressing or bathing?: Yes Independently performs ADLs?: No Does the patient have difficulty walking or climbing stairs?: Yes Weakness of Legs: Both Weakness of Arms/Hands: Both  Permission Sought/Granted                  Emotional Assessment         Alcohol / Substance Use: Not Applicable Psych Involvement: No (comment)  Admission diagnosis:  Postoperative wound infection [T81.49XA] Patient Active Problem List   Diagnosis Date Noted   Postoperative wound infection 01/30/2023   COVID-19 12/07/2022   Posterior vitreous detachment of both eyes 04/27/2021   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 macular degeneration of right eye with subfoveal involvement 04/27/2020   Right epiretinal membrane 04/27/2020   Pseudophakia 04/27/2020   Intermediate 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 04/27/2020   FTT (failure to thrive) in adult    Hematoma of scalp    Wheezing    Acute blood loss anemia 11/13/2016   Syncope and collapse 11/13/2016   Fall 11/12/2016   Annual physical exam 12/20/2014   Hip fracture ([place]) 05/25/2014   H/O bilateral salpingo-oophorectomy 12/11/2013   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 [address] Phone: [phone] Fax: [phone]     Social Determinants of Health (SDOH) Social History: SDOH Screenings    Depression (PHQ2-9): Low Risk  (09/12/2022)  Tobacco Use: Low Risk  (01/30/2023)   SDOH Interventions:     Readmission Risk Interventions     No data to display

## 2023-01-30 NOTE — Consult Note (Addendum)
WOC Nurse Consult Note: R lateral malleolus wound post ORIF 12/07/22 ? From CAM boot, patient was to go to OR today and had complications.  WOC asked for local wound care orders until surgery can  be performed  Reason for Consult: R lateral ankle wound  Wound type: surgical Pressure Injury POA: NA  Measurement: R lateral malleolus 2 cms x 1 cms x 0.2 cm  Wound bed:50% gray tan devitalized tissue 50% pink moist, hardware can be visualized on exam of wound  Drainage (amount, consistency, odor) minimal serosanguinous on old dressing  Periwound: well healed incision above  Dressing procedure/placement/frequency: Clean R lateral malleolus with NS, cut a small piece of Silver hydrofiber Kellie Simmering [phone]) and place in wound bed daily, cover with foam dressing.  May lift foam dressing to replace Silver daily, change foam dressing q3 days and prn soiling.  May need to soak old silver with NS if adhered to wound bed with dressing changes.   POC discussed with patient and bedside staff. Orthopedic following this patient and per their note wound unlikely to heal without removal of hardware which will likely be rescheduled.    WOC will not follow this patient. Re-consult if further needs arise.    Thank you,    Shelton Silvas MSN, RN-BC, [employer] [phone]

## 2023-01-30 NOTE — Progress Notes (Signed)
Several calls attempted to patient's SNF/ALF to obtain last dose of prescribed medications. Phone calls go straight to a generic voicemail. Son at bedside, but is not aware of patient's medication schedule and has the same phone number we have. Anesthesia made aware. Exhaust all options to get in touch with facility.

## 2023-01-30 NOTE — Interval H&P Note (Signed)
History and Physical Interval Note:  01/30/2023 7:49 AM  [name]  has presented today for surgery, with the diagnosis of Exposed ankle hardware.  The various methods of treatment have been discussed with the patient and family. After consideration of risks, benefits and other options for treatment, the patient has consented to  Procedure(s): HARDWARE REMOVAL ANKLE (Right) as a surgical intervention.  The patient's history has been reviewed, patient examined, no change in status, stable for surgery.  I have reviewed the patient's chart and labs.  Questions were answered to the patient's satisfaction.     Lennette Bihari P Kaelani Kendrick

## 2023-01-30 NOTE — Consult Note (Signed)
Reason for Consult/Chief Complaint: R PTX Consultant: Haddix, MD  [name] is an 89 y.o. female.   HPI: 82F presents today for surgical excision of exposed R ankle hardware. Noted to be dyspneic in pre-op, so CXR was obtained and was notable for moderate R PTX. Patient states dyspnea began prior to today, but unable to state when it started. She denies any recent fall. Son present at bedside, who states she has been living in a nursing home for the last ~6w and says he was not notified of any recent falls.   Past Medical History:  Diagnosis Date   Anemia    Arthritis    Atrial fibrillation (HCC)    COPD (chronic obstructive pulmonary disease) (HCC)    Dysrhythmia    a-fib   Hip pain, left    Hypertension    Sinoatrial node dysfunction (HCC)    Wheelchair dependent     Past Surgical History:  Procedure Laterality Date   BACK SURGERY     EYE SURGERY Bilateral    cataract removal   hip arthoplasty     total   KNEE ARTHROPLASTY     ORIF ANKLE FRACTURE Right 12/07/2022   Procedure: OPEN REDUCTION INTERNAL FIXATION (ORIF) ANKLE FRACTURE;  Surgeon: Shona Needles, MD;  Location: [place];  Service: Orthopedics;  Laterality: Right;   ORIF FEMUR FRACTURE Right 12/07/2022   Procedure: OPEN REDUCTION INTERNAL FIXATION (ORIF) DISTAL FEMUR;  Surgeon: Shona Needles, MD;  Location: [place];  Service: Orthopedics;  Laterality: Right;   PPM GENERATOR CHANGEOUT N/A 08/14/2018   Procedure: PPM GENERATOR CHANGEOUT;  Surgeon: Evans Lance, MD;  Location: [facility];  Service: Cardiovascular;  Laterality: N/A;   uterine polyp removal      Family History  Problem Relation Age of Onset   Prostate cancer Father    Heart failure Father    Heart disease Father    Stroke Sister    Depression Maternal Grandmother     Social History:  reports that she has never smoked. She has never used smokeless tobacco. She reports that she does not drink alcohol and does not use  drugs.  Allergies: No Known Allergies  Medications: I have reviewed the patient's current medications.  Results for orders placed or performed during the hospital encounter of 01/30/23 (from the past 48 hour(s))  Sample to Blood Bank     Status: None   Collection Time: 01/30/23  7:32 AM  Result Value Ref Range   Blood Bank Specimen SAMPLE AVAILABLE FOR TESTING    Sample Expiration      02/02/2023,2359 Performed at [facility], [place] [address], [place], [place] [address]   CBC per protocol     Status: Abnormal   Collection Time: 01/30/23  7:40 AM  Result Value Ref Range   WBC 8.2 4.0 - 10.5 K/uL   RBC 3.38 (L) 3.87 - 5.11 MIL/uL   Hemoglobin 11.0 (L) 12.0 - 15.0 g/dL   HCT 36.1 36.0 - 46.0 %   MCV 106.8 (H) 80.0 - 100.0 fL   MCH 32.5 26.0 - 34.0 pg   MCHC 30.5 30.0 - 36.0 g/dL   RDW 15.6 (H) 11.5 - 15.5 %   Platelets 210 150 - 400 K/uL   nRBC 0.0 0.0 - 0.2 %    Comment: Performed at [facility], [place] [address] [place], [place] [id number]  Basic metabolic panel per protocol     Status:  Abnormal   Collection Time: 01/30/23  7:40 AM  Result Value Ref Range   Sodium 135 135 - 145 mmol/L   Potassium 3.8 3.5 - 5.1 mmol/L   Chloride 97 (L) 98 - 111 mmol/L   CO2 31 22 - 32 mmol/L   Glucose, Bld 117 (H) 70 - 99 mg/dL    Comment: Glucose reference range applies only to samples taken after fasting for at least 8 hours.   BUN 14 8 - 23 mg/dL   Creatinine, Ser 0.51 0.44 - 1.00 mg/dL   Calcium 8.2 (L) 8.9 - 10.3 mg/dL   GFR, Estimated >60 >60 mL/min    Comment: (NOTE) Calculated using the CKD-EPI Creatinine Equation (2021)    Anion gap 7 5 - 15    Comment: Performed at [facility] [address] [place], [place] [address]    DG CHEST PORT 1 VIEW  Result Date: 01/30/2023 CLINICAL DATA:  Shortness of breath. Cough. Preoperative examination (hardware removal) EXAM: PORTABLE CHEST 1 VIEW COMPARISON:  12/06/2022 FINDINGS: Unchanged borderline enlarged cardiac  silhouette and mediastinal contours. Stable positioning of support apparatus. Interval development of a small right-sided pneumothorax. No definite mediastinal shift. The lungs appear hyperexpanded with mild diffuse slightly nodular thickening of the pulmonary interstitium. Potential trace right-sided pleural effusion. No evidence of edema. No acute osseous abnormalities. Postoperative change of the lumbar spine, incompletely evaluated. Severe degenerative change of the bilateral glenohumeral joints. Postcholecystectomy. IMPRESSION: 1. Interval development of a small right-sided pneumothorax. 2. Similar findings of lung hyperexpansion and chronic bronchitic change. Critical Value/emergent results were called by telephone at the time of interpretation on 01/30/2023 at 8:16 am to provider [facility] , who verbally acknowledged these results. Electronically Signed   By: Sandi Mariscal M.D.   On: 01/30/2023 08:18    ROS 10 point review of systems is negative except as listed above in HPI.   Physical Exam Blood pressure (!) 159/83, pulse 64, temperature 98.3 F (36.8 C), temperature source Oral, resp. rate 18, height 5\' 5"  (1.651 m), weight 63.3 kg, SpO2 95 %. Constitutional: well-developed, well-nourished HEENT: pupils equal, round, reactive to light, 51mm b/l, moist conjunctiva, external inspection of ears and nose normal, hearing intact Oropharynx: normal oropharyngeal mucosa, poor dentition Neck: no thyromegaly, trachea midline, no midline cervical tenderness to palpation Chest: breath sounds equal bilaterally, normal respiratory effort, no midline or lateral chest wall tenderness to palpation/deformity Abdomen: soft, NT, no bruising, no hepatosplenomegaly GU: normal female genitalia  Back: no wounds, no thoracic/lumbar spine tenderness to palpation, no thoracic/lumbar spine stepoffs Rectal: deferred Extremities: 2+ radial and pedal pulses bilaterally, intact motor and sensation bilateral UE and LE,  no peripheral edema MSK: unable to assess gait/station, no clubbing/cyanosis of fingers/toes, normal ROM of all four extremities Skin: warm, dry, no rashes Psych: normal memory, normal mood/affect     Assessment/Plan: 72F with spontaneous R PTX.  Suspect PTX is acute given dyspnea. Prior CXR reviewed, but most recent is from 2020. Complete lung expansion b/l on that film. Recommend R CT placement. Consent obtained from patient after detailed explanation of risks/benefits and after all questions answered from both her and her son.    Jesusita Oka, MD General and [facility]

## 2023-01-30 NOTE — Progress Notes (Signed)
CXR reviewed, persistent PTX. Increase sxn to 40, repeat CXR at 1500.  Jesusita Oka, MD General and [facility]

## 2023-01-30 NOTE — TOC CAGE-AID Note (Signed)
Transition of Care [facility]) - CAGE-AID Screening   Patient Details  Name: [name] MRN: [id number] Date of Birth: 06/24/1923   Elvina Sidle, RN Trauma Response Nurse Phone Number: [phone] 01/30/2023, 4:01 PM       CAGE-AID Screening:    Have You Ever Felt You Ought to Cut Down on Your Drinking or Drug Use?: No Have People Annoyed You By Critizing Your Drinking Or Drug Use?: No Have You Felt Bad Or Guilty About Your Drinking Or Drug Use?: No Have You Ever Had a Drink or Used Drugs First Thing In The Morning to Steady Your Nerves or to Get Rid of a Hangover?: No CAGE-AID Score: 0  Substance Abuse Education Offered: No (Pt has been living in a SNF for the past 6 weeks for rehab from a fall. No etoh)

## 2023-01-30 NOTE — H&P (Addendum)
History and Physical    Patient: [name] [id number] DOB: Nov 23, 1922 DOA: 01/30/2023 DOS: the patient was seen and examined on 01/30/2023 PCP: Susy Frizzle, MD  Patient coming from: Preop  Chief Complaint: Postoperative wound infection  HPI: [name] is a 89 y.o. female with medical history significant of hypertension, atrial fibrillation on Eliquis, COPD, arthritis, and anemia who presented for postoperative wound infection.  Patient has sustained a right ankle fracture after a fall back  and underwent fourth of both the distal femur and right bimalleolar ankle fracture by Dr. Doreatha Martin on 1/26.  She was placed in a walking cam boot postoperative which resulted in small wound forming on the lateral incision noted during postoperative follow-up.  She had been instructed to take off the cam boot off.  However, over the last 4 weeks wound on the lateral malleolus that not healed and was draining.  Despite oral antibiotics for at least 2 weeks patient had no improvement in the wound.  Plan was for her to be taken for removal of the hardware with Dr. Doreatha Martin.  Preoperative labs noted WBC 8.62, hemoglobin 11 and calcium 8.2.  Patient had been noted to have increased shortness of breath.  Chest x-ray had been obtained which revealed interval development of a small right-sided pneumothorax with similar findings of lung hyperexpansion and chronic bronchiectatic change.  Surgery had been canceled and due to these findings and trauma surgery had been consulted for need of chest tube.  Review of Systems: As mentioned in the history of present illness. All other systems reviewed and are negative. Past Medical History:  Diagnosis Date   Anemia    Arthritis    Atrial fibrillation (HCC)    COPD (chronic obstructive pulmonary disease) (HCC)    Dysrhythmia    a-fib   Hip pain, left    Hypertension    Sinoatrial node dysfunction (HCC)    Wheelchair dependent    Past Surgical History:   Procedure Laterality Date   BACK SURGERY     EYE SURGERY Bilateral    cataract removal   hip arthoplasty     total   KNEE ARTHROPLASTY     ORIF ANKLE FRACTURE Right 12/07/2022   Procedure: OPEN REDUCTION INTERNAL FIXATION (ORIF) ANKLE FRACTURE;  Surgeon: Shona Needles, MD;  Location: [place];  Service: Orthopedics;  Laterality: Right;   ORIF FEMUR FRACTURE Right 12/07/2022   Procedure: OPEN REDUCTION INTERNAL FIXATION (ORIF) DISTAL FEMUR;  Surgeon: Shona Needles, MD;  Location: [place];  Service: Orthopedics;  Laterality: Right;   PPM GENERATOR CHANGEOUT N/A 08/14/2018   Procedure: PPM GENERATOR CHANGEOUT;  Surgeon: Evans Lance, MD;  Location: [facility];  Service: Cardiovascular;  Laterality: N/A;   uterine polyp removal     Social History:  reports that she has never smoked. She has never used smokeless tobacco. She reports that she does not drink alcohol and does not use drugs.  No Known Allergies  Family History  Problem Relation Age of Onset   Prostate cancer Father    Heart failure Father    Heart disease Father    Stroke Sister    Depression Maternal Grandmother     Prior to Admission medications   Medication Sig Start Date End Date Taking? Authorizing Provider  apixaban (ELIQUIS) 2.5 MG TABS tablet Take 1 tablet (2.5 mg total) by mouth 2 (two) times daily. Dose decrease per MD. 07/31/22  Yes Branch, Alphonse Guild, MD  docusate sodium (  COLACE) 100 MG capsule Take 1 capsule (100 mg total) by mouth 2 (two) times daily. 12/11/22  Yes Donne Hazel, MD  furosemide (LASIX) 40 MG tablet Take 1 tablet (40 mg total) by mouth daily as needed. Patient taking differently: Take 40 mg by mouth daily as needed for fluid or edema. 07/06/21  Yes BranchAlphonse Guild, MD  Iron, Ferrous Sulfate, 325 (65 Fe) MG TABS Take 325 mg by mouth daily. 07/12/22  Yes Susy Frizzle, MD  lisinopril (ZESTRIL) 40 MG tablet TAKE 1 TABLET BY MOUTH EVERY DAY 06/27/22  Yes Branch, Alphonse Guild, MD   metoprolol tartrate (LOPRESSOR) 25 MG tablet TAKE 1 TABLET BY MOUTH TWICE A DAY 08/06/22  Yes Branch, Alphonse Guild, MD  Multiple Vitamin (MULTIVITAMIN) tablet Take 1 tablet by mouth daily.   Yes [provider]  vitamin D3 (CHOLECALCIFEROL) 25 MCG tablet Take 1 tablet (1,000 Units total) by mouth daily. 12/11/22 03/11/23 Yes Corinne Ports, PA-C    Physical Exam: Vitals:   01/29/23 1056 01/30/23 0647  BP:  (!) 159/83  Pulse:  64  Resp:  18  Temp:  98.3 F (36.8 C)  TempSrc:  Oral  SpO2:  95%  Weight: 63.3 kg 63.3 kg  Height: 5\' 5"  (1.651 m) 5\' 5"  (1.651 m)    Constitutional: Elderly female currently in no acute distress resting Eyes: PERRL, lids and conjunctivae normal ENMT: Mucous membranes are dry. Posterior pharynx clear of any exudate or lesions.  Neck: normal, supple Respiratory: clear to auscultation bilaterally, no wheezing, no crackles. Normal respiratory effort.   Cardiovascular: Regular rate and rhythm, no murmurs / rubs / gallops.  Right-sided chest tube in place. Abdomen: no tenderness, no masses palpated.   Bowel sounds positive.  Musculoskeletal: no clubbing / cyanosis.  Swelling noted of the right ankle. Skin:   Open wound noted to the lateral aspect of the right ankle without clear drainage present.. Neurologic: CN 2-12 grossly intact.   Strength 5/5 in all 4.  Psychiatric: Normal judgment and insight. Alert and oriented x 3. Normal mood.   Data Reviewed:  EKG revealed a ventricularly paced rhythm at 63 bpm. Reviewed labs, imaging, and pertinent records as noted above in HPI.  Assessment and Plan: Postoperative wound infection of the right ankle Patient had ORIF of a right ankle fracture sustained after a fall by Dr. Doreatha Martin on 1/26.  Postoperatively patient had been placed in a cam boot which resulted in wound malleolus.  Since that time wound has failed to heal and started draining fluid. -Admit to a surgical telemetry bed -Check CRP -Continue  cefazolin -Appreciate orthopedic consultative services, will follow-up for any further  Pneumothorax Acute.  Patient was noted to have plaints of shortness of breath.  Chest x-ray had noted a small right pneumothorax.  Trauma surgery has been consulted and placed chest tube.  Repeat chest x-ray post tube placement did not note any change in the small pneumothorax. -Continuous pulse oximetry with nasal oxygen on 2 L -Continue chest tube per trauma -Appreciate trauma surgery,  will follow-up for any further recommendations  Paroxysmal atrial fibrillation on chronic anticoagulation S/p PM Patient currently in sinus rhythm.   -Hold Eliquis  Essential hypertension Blood pressures initially 139/62 -169/70. -Continue home blood pressure regimen as tolerated   DVT prophylaxis: SCDs Advance Care Planning:   Code Status: DNR   Consults: Trauma surgery and orthopedics   Severity of Illness: The appropriate patient status for this patient is INPATIENT. Inpatient status is  judged to be reasonable and necessary in order to provide the required intensity of service to ensure the patient's safety. The patient's presenting symptoms, physical exam findings, and initial radiographic and laboratory data in the context of their chronic comorbidities is felt to place them at high risk for further clinical deterioration. Furthermore, it is not anticipated that the patient will be medically stable for discharge from the hospital within 2 midnights of admission.   * I certify that at the point of admission it is my clinical judgment that the patient will require inpatient hospital care spanning beyond 2 midnights from the point of admission due to high intensity of service, high risk for further deterioration and high frequency of surveillance required.*  Author: Norval Morton, MD 01/30/2023 8:37 AM  For on call review [email or web address].

## 2023-01-30 NOTE — Procedures (Signed)
   Procedure Note  Date: 01/30/2023  Procedure: tube thoracostomy--right    Pre-op diagnosis: right pneumothorax  Post-op diagnosis: same  Surgeon: Jesusita Oka, MD  Anesthesia: local   EBL: <5cc procedural; 5cc  serosanguinous fluid  evacuated Drains/Implants: 38F chest tube Specimen: none  Description of procedure: Time-out was performed verifying correct patient, procedure, site, laterality, and signature of informed consent. Fifteen cc's of local anesthetic was infiltrated into the tissues just over the fourth intercostal space.  A small skin nick was made at the fourth intercostal space. An introducer needle was inserted and a guidewire inserted through the needle. The needle was removed and the tract dilated. The chest tube was inserted over the guidewire and the guidewire removed.   The tube was secured at the skin with suture and connected to an atrium at -20cm water wall suction. Immediate output from the chest tube was 5cc and was serosanguinous. The site was dressed with xeroform, gauze, and tape. The patient tolerated the procedure well. There were no complications. Follow up chest x-ray was ordered to confirm tube positioning, complete evacuation, and complete lung re-expansion.    Jesusita Oka, MD General and [facility]

## 2023-01-30 NOTE — Progress Notes (Signed)
Pt. Arrived to unit drowsy but alert to person and place, no c/o pain. Bed in lowest position call bell within reach.

## 2023-01-31 ENCOUNTER — Inpatient Hospital Stay (HOSPITAL_COMMUNITY): Payer: Medicare Other

## 2023-01-31 DIAGNOSIS — T8149XA Infection following a procedure, other surgical site, initial encounter: Secondary | ICD-10-CM | POA: Diagnosis not present

## 2023-01-31 DIAGNOSIS — D62 Acute posthemorrhagic anemia: Secondary | ICD-10-CM

## 2023-01-31 DIAGNOSIS — Z01818 Encounter for other preprocedural examination: Secondary | ICD-10-CM

## 2023-01-31 LAB — SURGICAL PCR SCREEN
MRSA, PCR: NEGATIVE
Staphylococcus aureus: NEGATIVE

## 2023-01-31 MED ORDER — DOXYCYCLINE HYCLATE 100 MG PO TABS: 100.0000 mg | ORAL_TABLET | Freq: Two times a day (BID) | ORAL | Status: DC

## 2023-01-31 MED ORDER — HYDRALAZINE HCL 20 MG/ML IJ SOLN
10.0000 mg | INTRAMUSCULAR | Status: DC | PRN
  Administered 2023-01-31: 10 mg via INTRAVENOUS
  Filled 2023-01-31: qty 1

## 2023-01-31 NOTE — Progress Notes (Signed)
Anesthesia note: I evaluated [name] in preop in anticipation of surgery. However, I became immediately concerned about her work of breathing. Per family at bedside she had had a positive covid test weeks ago, but she was never acutely ill by his estimation. [name] was tachypnic and was only able to take shallow breaths. I listened to her chest and she was moving very little air, breath sounds were distant and asymmetric. I ordered stat CXR. Radiologist notified me of "small" pneumothorax. I spoke to Dr. Doreatha Martin about this and we looked at the chest film together. Dr. Doreatha Martin then called Trauma surgery for possible chest tube placement. The decision was made to delay surgery for further medical/surgical management.

## 2023-01-31 NOTE — Progress Notes (Signed)
1 Day Post-Op  Subjective: CC: SOB improved after chest tube placement. On RA. Denies tobacco use.   Chest tube with 100cc output since placement. CXR this am without ptx.   Objective: Vital signs in last 24 hours: Temp:  [97.6 F (36.4 C)-98.1 F (36.7 C)] 97.6 F (36.4 C) (03/21 0732) Pulse Rate:  [62-67] 64 (03/21 0732) Resp:  [12-23] 21 (03/21 0732) BP: (139-182)/(62-93) 182/89 (03/21 0732) SpO2:  [92 %-98 %] 98 % (03/21 0732)    Intake/Output from previous day: 03/20 0701 - 03/21 0700 In: -  Out: 100 [Chest Tube:100] Intake/Output this shift: No intake/output data recorded.  PE: Gen:  Alert, NAD, pleasant Card:  Reg Pulm:  CTAB, no W/R/R, effort normal. R CT in place on -40 with no obvious air leak. 250cc SS output in cannister.   Lab Results:  Recent Labs    01/30/23 0740  WBC 8.2  HGB 11.0*  HCT 36.1  PLT 210   BMET Recent Labs    01/30/23 0740  NA 135  K 3.8  CL 97*  CO2 31  GLUCOSE 117*  BUN 14  CREATININE 0.51  CALCIUM 8.2*   PT/INR No results for input(s): "LABPROT", "INR" in the last 72 hours. CMP     Component Value Date/Time   NA 135 01/30/2023 0740   K 3.8 01/30/2023 0740   CL 97 (L) 01/30/2023 0740   CO2 31 01/30/2023 0740   GLUCOSE 117 (H) 01/30/2023 0740   BUN 14 01/30/2023 0740   CREATININE 0.51 01/30/2023 0740   CREATININE 0.56 (L) 09/12/2022 1429   CALCIUM 8.2 (L) 01/30/2023 0740   PROT 4.7 (L) 12/10/2022 0323   ALBUMIN 2.2 (L) 12/10/2022 0323   AST 36 12/10/2022 0323   ALT 10 12/10/2022 0323   ALKPHOS 38 12/10/2022 0323   BILITOT 0.5 12/10/2022 0323   GFRNONAA >60 01/30/2023 0740   GFRNONAA 75 05/03/2021 1317   GFRAA 86 05/03/2021 1317   Lipase  No results found for: "LIPASE"  Studies/Results: DG CHEST PORT 1 VIEW  Result Date: 01/31/2023 CLINICAL DATA:  Respiratory failure. EXAM: PORTABLE CHEST 1 VIEW COMPARISON:  January 30, 2023. FINDINGS: Stable cardiomediastinal silhouette. Left-sided pacemaker is  unchanged. Stable position of right-sided chest tube. No pneumothorax is noted. Mild left basilar subsegmental atelectasis or infiltrate is noted. Bony thorax is unremarkable. IMPRESSION: Stable position of right-sided chest tube. Mild left basilar subsegmental atelectasis or infiltrate. Electronically Signed   By: Marijo Conception M.D.   On: 01/31/2023 08:30   DG Chest Port 1 View  Result Date: 01/30/2023 CLINICAL DATA:  Pneumothorax, post chest tube placement. EXAM: PORTABLE CHEST 1 VIEW COMPARISON:  Earlier same day FINDINGS: Suspected repositioning of right-sided chest tube, now with resolution of previously noted small residual right-sided pneumothorax. Note is made of a minimal amount of right lateral chest wall subcutaneous emphysema. Unchanged cardiac silhouette and mediastinal contours. Left anterior chest wall dual lead pacemaker, unchanged. Improved aeration the right lung with minimal residual right basilar opacities. No new focal airspace opacities. No evidence of edema. No acute osseous abnormalities. Advanced degenerative change the bilateral glenohumeral joints, incompletely evaluated. Post lower lumbar paraspinal fusion, incompletely evaluated. Postcholecystectomy. IMPRESSION: Suspected repositioning of right-sided chest tube, now with resolution of previously noted small right-sided pneumothorax and improved aeration of the right lung. Electronically Signed   By: Sandi Mariscal M.D.   On: 01/30/2023 15:43   DG CHEST PORT 1 VIEW  Result Date: 01/30/2023 CLINICAL DATA:  Post  right sided chest tube placement. EXAM: PORTABLE CHEST 1 VIEW COMPARISON:  Earlier same day FINDINGS: Interval placement right-sided chest tube with no significant change in size of small right-sided pneumothorax. Suspected trace right-sided pleural effusion, unchanged. Unchanged cardiac silhouette and mediastinal contours post left anterior chest wall dual lead pacemaker. Minimal left basilar opacities favored to represent  atelectasis. No evidence of edema. No definite acute osseous abnormalities. Degenerative change the right glenohumeral joint is suspected though incompletely evaluated. Postoperative change of the lower lumbar spine. Postcholecystectomy. IMPRESSION: Interval placement of right-sided chest tube with no significant change in size of small right-sided pneumothorax. Electronically Signed   By: Sandi Mariscal M.D.   On: 01/30/2023 09:58   DG CHEST PORT 1 VIEW  Result Date: 01/30/2023 CLINICAL DATA:  Shortness of breath. Cough. Preoperative examination (hardware removal) EXAM: PORTABLE CHEST 1 VIEW COMPARISON:  12/06/2022 FINDINGS: Unchanged borderline enlarged cardiac silhouette and mediastinal contours. Stable positioning of support apparatus. Interval development of a small right-sided pneumothorax. No definite mediastinal shift. The lungs appear hyperexpanded with mild diffuse slightly nodular thickening of the pulmonary interstitium. Potential trace right-sided pleural effusion. No evidence of edema. No acute osseous abnormalities. Postoperative change of the lumbar spine, incompletely evaluated. Severe degenerative change of the bilateral glenohumeral joints. Postcholecystectomy. IMPRESSION: 1. Interval development of a small right-sided pneumothorax. 2. Similar findings of lung hyperexpansion and chronic bronchitic change. Critical Value/emergent results were called by telephone at the time of interpretation on 01/30/2023 at 8:16 am to provider [facility] , who verbally acknowledged these results. Electronically Signed   By: Sandi Mariscal M.D.   On: 01/30/2023 08:18    Anti-infectives: Anti-infectives (From admission, onward)    Start     Dose/Rate Route Frequency Ordered Stop   01/31/23 1000  doxycycline (VIBRA-TABS) tablet 100 mg  Status:  Discontinued        100 mg Oral Every 12 hours 01/31/23 0833 01/31/23 0858   01/30/23 2345  ceFAZolin (ANCEF) IVPB 1 g/50 mL premix        1 g 100 mL/hr over 30  Minutes Intravenous Every 8 hours 01/30/23 2246     01/30/23 0700  ceFAZolin (ANCEF) IVPB 2g/100 mL premix  Status:  Discontinued        2 g 200 mL/hr over 30 Minutes Intravenous On call to O.R. 01/30/23 0651 01/30/23 1011        Assessment/Plan 24F with spontaneous R PTX - S/p Chest tube on 3/20 by Dr. Bobbye Morton. There was persistent ptx after chest tube placement so suction was increased to -40. Follow up films with resolution of ptx. CXR this am with without ptx. Decrease suction to -20 today and repeat CXR in am. Monitor output. Pulm toilet.   S/p ORIF R ankle fracture 12/07/22 - Per ortho. On abx. Plans for OR in AM.  PAF, HTN, and other chronic medical conditions - per primary  I reviewed nursing notes, Consultant (Ortho) notes, hospitalist notes, last 24 h vitals and pain scores, last 48 h intake and output, last 24 h labs and trends, and last 24 h imaging results.    LOS: 1 day    Jillyn Ledger , [facility] Surgery 01/31/2023, 9:02 AM Please see Amion for pager number during day hours 7:00am-4:30pm

## 2023-01-31 NOTE — Hospital Course (Signed)
89 y.o. female with medical history significant of hypertension, atrial fibrillation on Eliquis, COPD, arthritis, and anemia who presented for postoperative wound infection.  Patient has sustained a right ankle fracture after a fall back  and underwent fourth of both the distal femur and right bimalleolar ankle fracture by Dr. Doreatha Martin on 1/26.  She was placed in a walking cam boot postoperative which resulted in small wound forming on the lateral incision noted during postoperative follow-up.  She had been instructed to take off the cam boot off.  However, over the last 4 weeks wound on the lateral malleolus that not healed and was draining.  Despite oral antibiotics for at least 2 weeks patient had no improvement in the wound.  Plan was for her to be taken for removal of the hardware with Dr. Doreatha Martin.   Preoperative labs noted WBC 8.62, hemoglobin 11 and calcium 8.2.  Patient had been noted to have increased shortness of breath.  Chest x-ray had been obtained which revealed interval development of a small right-sided pneumothorax with similar findings of lung hyperexpansion and chronic bronchiectatic change.  Surgery had been canceled and due to these findings and trauma surgery had been consulted for need of chest tube.

## 2023-01-31 NOTE — TOC Progression Note (Signed)
Transition of Care [facility]) - Progression Note    Patient Details  Name: [name] MRN: [id number] Date of Birth: 1923-08-16  Transition of Care [facility]) CM/SW Contact  Joanne Chars, LCSW Phone Number: 01/31/2023, 8:16 AM  Clinical Narrative:   CSW message from Michelle/[facility].  She is planning to admit pt.  Son has made arrangements to private pay, still interested in pursuing STR with insurance if appropriate.     Expected Discharge Plan: [place] Barriers to Discharge: Continued Medical Work up  Expected Discharge Plan and Services                                               Social Determinants of Health (SDOH) Interventions SDOH Screenings   Depression (PHQ2-9): Low Risk  (09/12/2022)  Tobacco Use: Low Risk  (01/30/2023)    Readmission Risk Interventions     No data to display

## 2023-01-31 NOTE — Progress Notes (Signed)
Progress Note   Patient: [name] [id number] DOB: [birth year] DOA: 01/30/2023     1 DOS: the patient was seen and examined on 01/31/2023   Brief hospital course: 89 y.o. female with medical history significant of hypertension, atrial fibrillation on Eliquis, COPD, arthritis, and anemia who presented for postoperative wound infection.  Patient has sustained a right ankle fracture after a fall back  and underwent fourth of both the distal femur and right bimalleolar ankle fracture by Dr. Doreatha Martin on 1/26.  She was placed in a walking cam boot postoperative which resulted in small wound forming on the lateral incision noted during postoperative follow-up.  She had been instructed to take off the cam boot off.  However, over the last 4 weeks wound on the lateral malleolus that not healed and was draining.  Despite oral antibiotics for at least 2 weeks patient had no improvement in the wound.  Plan was for her to be taken for removal of the hardware with Dr. Doreatha Martin.   Preoperative labs noted WBC 8.62, hemoglobin 11 and calcium 8.2.  Patient had been noted to have increased shortness of breath.  Chest x-ray had been obtained which revealed interval development of a small right-sided pneumothorax with similar findings of lung hyperexpansion and chronic bronchiectatic change.  Surgery had been canceled and due to these findings and trauma surgery had been consulted for need of chest tube.  Assessment and Plan: Postoperative wound infection of the right ankle Patient had ORIF of a right ankle fracture sustained after a fall by Dr. Doreatha Martin on 1/26.  Postoperatively patient had been placed in a cam boot which resulted in wound malleolus.  Since that time wound has failed to heal and started draining fluid. -Continue cefazolin -Per Ortho, plan for hardware removal of R ankle tomorrow   Pneumothorax Acute.  Patient was noted to have plaints of shortness of breath.  Chest x-ray had noted a small right  pneumothorax.  Trauma surgery has been consulted and placed chest tube.  Repeat chest x-ray post tube placement did not note any change in the small pneumothorax. -continue chest tube management per General Surgery   Paroxysmal atrial fibrillation on chronic anticoagulation S/p PM Patient currently in sinus rhythm.   -Hold Eliquis for now, ultimately would resume once OK with Surgery and Ortho   Essential hypertension -Continue home blood pressure regimen as tolerated -suboptimally controlled at this time. Will add PRN hydralazine      Subjective: Without complaints this AM  Physical Exam: Vitals:   01/30/23 1022 01/30/23 1956 01/31/23 0225 01/31/23 0732  BP: (!) 169/70 (!) 144/64 (!) 174/93 (!) 182/89  Pulse: 64 66 62 64  Resp: 14 (!) 23 (!) 21 (!) 21  Temp: 98.1 F (36.7 C) 98.1 F (36.7 C) 97.8 F (36.6 C) 97.6 F (36.4 C)  TempSrc: Oral Oral Oral Oral  SpO2: 97% 97% 95% 98%  Weight:      Height:       General exam: Awake, laying in bed, in nad Respiratory system: Normal respiratory effort, no wheezing Cardiovascular system: regular rate, s1, s2 Gastrointestinal system: Soft, nondistended, positive BS Central nervous system: CN2-12 grossly intact, strength intact Extremities: Perfused, no clubbing Skin: Normal skin turgor, no notable skin lesions seen Psychiatry: Mood normal // no visual hallucinations   Data Reviewed:  Labs reviewed: na 135, K 3.8, Cr 0.51, Hgb 11.0  Family Communication: Pt in room, family not at bedside  Disposition: Status is: Inpatient Remains inpatient appropriate because: Severity  of illness  Planned Discharge Destination: Home    Author: Marylu Lund, MD 01/31/2023 3:21 PM  For on call review [email or web address].

## 2023-01-31 NOTE — Progress Notes (Signed)
Orthopaedic Trauma Progress Note  SUBJECTIVE: Doing ok this morning, states she is hungry. Feels like she is breathing better than yesterday but also says she always has a hard time breathing when lying down.  No other specific concerns or complaints.  OBJECTIVE:  Vitals:   01/31/23 0225 01/31/23 0732  BP: (!) 174/93 (!) 182/89  Pulse: 62 64  Resp: (!) 21 (!) 21  Temp: 97.8 F (36.6 C) 97.6 F (36.4 C)  SpO2: 95% 98%    General: Sitting up in bed, eating breakfast, no acute distress Respiratory: No increased work of breathing.  Right lower extremity: Dressing over the lateral ankle clean, dry, intact.  I pulled back the edge of the dressing to evaluate the wound.  Hardware is exposed.  No purulent drainage noted.  Ankle DF/PF intact.  Endorses sensation to light touch of the foot.  LABS:  Results for orders placed or performed during the hospital encounter of 01/30/23 (from the past 24 hour(s))  C-reactive protein     Status: Abnormal   Collection Time: 01/30/23  1:01 PM  Result Value Ref Range   CRP 3.6 (H) <1.0 mg/dL  Surgical PCR screen     Status: None   Collection Time: 01/30/23  3:52 PM   Specimen: Nasal Mucosa; Nasal Swab  Result Value Ref Range   MRSA, PCR NEGATIVE NEGATIVE   Staphylococcus aureus NEGATIVE NEGATIVE    ASSESSMENT: Prim Amoah Loeber is a 89 y.o. female presenting for hardware removal right ankle.  Admitted for right-sided pneumothorax   CV/Blood loss: Hemoglobin 11.0 on admission  PLAN: Weightbearing: WBAT RLE ROM: Okay for unrestricted ankle motion Incisional and dressing care: Continue dressings per [place] instructions Showering: Hold off on showering Orthopedic device(s): None Pain management:  1. Tylenol 650 mg q 6 hours PRN 2. Robaxin 500 mg q 6 hours PRN 3. Oxycodone 5-10 mg q 4 hours PRN 4. Dilaudid 1 mg q 3 hours PRN VTE prophylaxis:  Eliquis on hold , SCDs ID: Ancef 1 g every 8 hours for wound infection Foley/Lines:  No foley, KVO  IVFs  Dispo: PT/OT as able.  With tentative plan for OR tomorrow for hardware removal right ankle if patient medically stable.  Keep n.p.o. at midnight.    Follow - up plan:  To be determined   Contact information:  Katha Hamming MD, Rushie Nyhan PA-C. After hours and holidays please check [email or web address] for group call information for [facility]   Gwinda Passe, PA-C [phone] (office) [email or web address]

## 2023-02-01 ENCOUNTER — Inpatient Hospital Stay (HOSPITAL_COMMUNITY): Payer: Medicare Other

## 2023-02-01 ENCOUNTER — Inpatient Hospital Stay (HOSPITAL_COMMUNITY): Payer: Medicare Other | Admitting: Anesthesiology

## 2023-02-01 ENCOUNTER — Other Ambulatory Visit: Payer: Self-pay

## 2023-02-01 ENCOUNTER — Encounter (HOSPITAL_COMMUNITY)
Admission: AD | Disposition: A | Discharge status: Skilled Nursing Facility | Payer: Self-pay | Source: Ambulatory Visit | Attending: Internal Medicine

## 2023-02-01 ENCOUNTER — Encounter (HOSPITAL_COMMUNITY): Payer: Self-pay | Admitting: Internal Medicine

## 2023-02-01 DIAGNOSIS — T8149XA Infection following a procedure, other surgical site, initial encounter: Secondary | ICD-10-CM | POA: Diagnosis not present

## 2023-02-01 DIAGNOSIS — I10 Essential (primary) hypertension: Secondary | ICD-10-CM

## 2023-02-01 DIAGNOSIS — I4891 Unspecified atrial fibrillation: Secondary | ICD-10-CM

## 2023-02-01 DIAGNOSIS — J449 Chronic obstructive pulmonary disease, unspecified: Secondary | ICD-10-CM

## 2023-02-01 DIAGNOSIS — T84196A Other mechanical complication of internal fixation device of bone of right lower leg, initial encounter: Secondary | ICD-10-CM

## 2023-02-01 DIAGNOSIS — D62 Acute posthemorrhagic anemia: Secondary | ICD-10-CM | POA: Diagnosis not present

## 2023-02-01 HISTORY — PX: HARDWARE REMOVAL: SHX979

## 2023-02-01 LAB — SURGICAL PCR SCREEN
MRSA, PCR: NEGATIVE
Staphylococcus aureus: NEGATIVE

## 2023-02-01 LAB — CBC
HCT: 36.5 % (ref 36.0–46.0)
Hemoglobin: 11.5 g/dL — ABNORMAL LOW (ref 12.0–15.0)
MCH: 32.3 pg (ref 26.0–34.0)
MCHC: 31.5 g/dL (ref 30.0–36.0)
MCV: 102.5 fL — ABNORMAL HIGH (ref 80.0–100.0)
Platelets: 237 10*3/uL (ref 150–400)
RBC: 3.56 MIL/uL — ABNORMAL LOW (ref 3.87–5.11)
RDW: 15.3 % (ref 11.5–15.5)
WBC: 8.6 10*3/uL (ref 4.0–10.5)
nRBC: 0 % (ref 0.0–0.2)

## 2023-02-01 LAB — COMPREHENSIVE METABOLIC PANEL
ALT: 5 U/L (ref 0–44)
AST: 18 U/L (ref 15–41)
Albumin: 2.1 g/dL — ABNORMAL LOW (ref 3.5–5.0)
Alkaline Phosphatase: 83 U/L (ref 38–126)
Anion gap: 10 (ref 5–15)
BUN: 6 mg/dL — ABNORMAL LOW (ref 8–23)
CO2: 28 mmol/L (ref 22–32)
Calcium: 8.3 mg/dL — ABNORMAL LOW (ref 8.9–10.3)
Chloride: 96 mmol/L — ABNORMAL LOW (ref 98–111)
Creatinine, Ser: 0.52 mg/dL (ref 0.44–1.00)
GFR, Estimated: >60 mL/min (ref >60)
Glucose, Bld: 105 mg/dL — ABNORMAL HIGH (ref 70–99)
Potassium: 4 mmol/L (ref 3.5–5.1)
Sodium: 134 mmol/L — ABNORMAL LOW (ref 135–145)
Total Bilirubin: 0.6 mg/dL (ref 0.3–1.2)
Total Protein: 5.6 g/dL — ABNORMAL LOW (ref 6.5–8.1)

## 2023-02-01 LAB — CALCIUM, IONIZED: Calcium, Ionized, Serum: 4.9 mg/dL (ref 4.5–5.6)

## 2023-02-01 SURGERY — REMOVAL, HARDWARE
Anesthesia: Monitor Anesthesia Care | Site: Ankle | Laterality: Right

## 2023-02-01 MED ORDER — ONDANSETRON HCL 4 MG/2ML IJ SOLN: 4.0000 mg | Freq: Once | INTRAMUSCULAR | Status: DC | PRN

## 2023-02-01 MED ORDER — LACTATED RINGERS IV SOLN: INTRAVENOUS | Status: DC

## 2023-02-01 MED ORDER — DEXAMETHASONE SODIUM PHOSPHATE 10 MG/ML IJ SOLN
INTRAMUSCULAR | Status: DC | PRN
  Administered 2023-02-01: 10 mg

## 2023-02-01 MED ORDER — OXYCODONE HCL 5 MG PO TABS
5.0000 mg | ORAL_TABLET | Freq: Four times a day (QID) | ORAL | Status: DC | PRN
  Administered 2023-02-01: 5 mg via ORAL
  Filled 2023-02-01: qty 1

## 2023-02-01 MED ORDER — VITAMIN D 25 MCG (1000 UNIT) PO TABS
1000.0000 [IU] | ORAL_TABLET | Freq: Every day | ORAL | Status: DC
  Administered 2023-02-02 – 2023-02-05 (×4): 1000 [IU] via ORAL
  Filled 2023-02-01 (×5): qty 1

## 2023-02-01 MED ORDER — 0.9 % SODIUM CHLORIDE (POUR BTL) OPTIME
TOPICAL | Status: DC | PRN
  Administered 2023-02-01: 1000 mL

## 2023-02-01 MED ORDER — ROPIVACAINE HCL 5 MG/ML IJ SOLN
INTRAMUSCULAR | Status: DC | PRN
  Administered 2023-02-01: 40 mL via PERINEURAL

## 2023-02-01 MED ORDER — PROPOFOL 500 MG/50ML IV EMUL
INTRAVENOUS | Status: DC | PRN
  Administered 2023-02-01: 25 ug/kg/min via INTRAVENOUS

## 2023-02-01 MED ORDER — ORAL CARE MOUTH RINSE: 15.0000 mL | Freq: Once | OROMUCOSAL | Status: AC

## 2023-02-01 MED ORDER — FENTANYL CITRATE (PF) 100 MCG/2ML IJ SOLN
INTRAMUSCULAR | Status: AC
  Administered 2023-02-01: 25 ug via INTRAVENOUS
  Filled 2023-02-01: qty 2

## 2023-02-01 MED ORDER — FENTANYL CITRATE (PF) 100 MCG/2ML IJ SOLN: 25.0000 ug | INTRAMUSCULAR | Status: DC | PRN

## 2023-02-01 MED ORDER — FENTANYL CITRATE (PF) 100 MCG/2ML IJ SOLN: 25.0000 ug | Freq: Once | INTRAMUSCULAR | Status: AC

## 2023-02-01 MED ORDER — CHLORHEXIDINE GLUCONATE 0.12 % MT SOLN: 15.0000 mL | Freq: Once | OROMUCOSAL | Status: AC

## 2023-02-01 MED ORDER — DOXYCYCLINE HYCLATE 100 MG PO TABS
100.0000 mg | ORAL_TABLET | Freq: Two times a day (BID) | ORAL | Status: DC
  Administered 2023-02-01 – 2023-02-05 (×8): 100 mg via ORAL
  Filled 2023-02-01 (×8): qty 1

## 2023-02-01 MED ORDER — VANCOMYCIN HCL 1000 MG IV SOLR
INTRAVENOUS | Status: AC
  Filled 2023-02-01: qty 20

## 2023-02-01 MED ORDER — CHLORHEXIDINE GLUCONATE 0.12 % MT SOLN
OROMUCOSAL | Status: AC
  Administered 2023-02-01: 15 mL via OROMUCOSAL
  Filled 2023-02-01: qty 15

## 2023-02-01 MED ORDER — VANCOMYCIN HCL 1000 MG IV SOLR
INTRAVENOUS | Status: DC | PRN
  Administered 2023-02-01: 1000 mg

## 2023-02-01 SURGICAL SUPPLY — 66 items
APL PRP STRL LF DISP 70% ISPRP (MISCELLANEOUS) ×1
BAG COUNTER SPONGE SURGICOUNT (BAG) ×2 IMPLANT
BAG SPNG CNTER NS LX DISP (BAG) ×1
BANDAGE ESMARK 6X9 LF (GAUZE/BANDAGES/DRESSINGS) ×2 IMPLANT
BNDG CMPR 5X6 CHSV STRCH STRL (GAUZE/BANDAGES/DRESSINGS)
BNDG CMPR 9X6 STRL LF SNTH (GAUZE/BANDAGES/DRESSINGS)
BNDG COHESIVE 6X5 TAN ST LF (GAUZE/BANDAGES/DRESSINGS) ×2 IMPLANT
BNDG ELASTIC 4X5.8 VLCR STR LF (GAUZE/BANDAGES/DRESSINGS) ×2 IMPLANT
BNDG ELASTIC 6X5.8 VLCR STR LF (GAUZE/BANDAGES/DRESSINGS) ×2 IMPLANT
BNDG ESMARK 6X9 LF (GAUZE/BANDAGES/DRESSINGS)
BNDG GAUZE DERMACEA FLUFF 4 (GAUZE/BANDAGES/DRESSINGS) ×4 IMPLANT
BNDG GZE DERMACEA 4 6PLY (GAUZE/BANDAGES/DRESSINGS)
BRUSH SCRUB EZ PLAIN DRY (MISCELLANEOUS) ×4 IMPLANT
CHLORAPREP W/TINT 26 (MISCELLANEOUS) ×2 IMPLANT
COVER SURGICAL LIGHT HANDLE (MISCELLANEOUS) ×4 IMPLANT
CUFF TOURN SGL QUICK 18X4 (TOURNIQUET CUFF) IMPLANT
CUFF TOURN SGL QUICK 24 (TOURNIQUET CUFF)
CUFF TOURN SGL QUICK 34 (TOURNIQUET CUFF)
CUFF TRNQT CYL 24X4X16.5-23 (TOURNIQUET CUFF) IMPLANT
CUFF TRNQT CYL 34X4.125X (TOURNIQUET CUFF) IMPLANT
DRAPE C-ARM 42X72 X-RAY (DRAPES) IMPLANT
DRAPE C-ARMOR (DRAPES) ×2 IMPLANT
DRAPE U-SHAPE 47X51 STRL (DRAPES) ×2 IMPLANT
DRSG ADAPTIC 3X8 NADH LF (GAUZE/BANDAGES/DRESSINGS) ×2 IMPLANT
DRSG MEPITEL 4X7.2 (GAUZE/BANDAGES/DRESSINGS) IMPLANT
ELECT REM PT RETURN 9FT ADLT (ELECTROSURGICAL) ×1
ELECTRODE REM PT RTRN 9FT ADLT (ELECTROSURGICAL) ×2 IMPLANT
GAUZE SPONGE 4X4 12PLY STRL (GAUZE/BANDAGES/DRESSINGS) ×2 IMPLANT
GLOVE BIO SURGEON STRL SZ 6.5 (GLOVE) ×6 IMPLANT
GLOVE BIO SURGEON STRL SZ7.5 (GLOVE) ×8 IMPLANT
GLOVE BIOGEL PI IND STRL 6.5 (GLOVE) ×2 IMPLANT
GLOVE BIOGEL PI IND STRL 7.5 (GLOVE) ×2 IMPLANT
GOWN STRL REUS W/ TWL LRG LVL3 (GOWN DISPOSABLE) ×4 IMPLANT
GOWN STRL REUS W/TWL LRG LVL3 (GOWN DISPOSABLE) ×4
KIT BASIN OR (CUSTOM PROCEDURE TRAY) ×2 IMPLANT
KIT TURNOVER KIT B (KITS) ×2 IMPLANT
MANIFOLD NEPTUNE II (INSTRUMENTS) ×2 IMPLANT
NDL 22X1.5 STRL (OR ONLY) (MISCELLANEOUS) IMPLANT
NEEDLE 22X1.5 STRL (OR ONLY) (MISCELLANEOUS) IMPLANT
NS IRRIG 1000ML POUR BTL (IV SOLUTION) ×2 IMPLANT
PACK ORTHO EXTREMITY (CUSTOM PROCEDURE TRAY) ×2 IMPLANT
PAD ARMBOARD 7.5X6 YLW CONV (MISCELLANEOUS) ×4 IMPLANT
PAD CAST 4YDX4 CTTN HI CHSV (CAST SUPPLIES) IMPLANT
PADDING CAST COTTON 4X4 STRL (CAST SUPPLIES) ×1
PADDING CAST COTTON 6X4 STRL (CAST SUPPLIES) ×6 IMPLANT
SPONGE T-LAP 18X18 ~~LOC~~+RFID (SPONGE) ×2 IMPLANT
STAPLER VISISTAT 35W (STAPLE) IMPLANT
STOCKINETTE IMPERVIOUS LG (DRAPES) ×2 IMPLANT
STRIP CLOSURE SKIN 1/2X4 (GAUZE/BANDAGES/DRESSINGS) IMPLANT
SUCTION FRAZIER HANDLE 10FR (MISCELLANEOUS) ×1
SUCTION TUBE FRAZIER 10FR DISP (MISCELLANEOUS) IMPLANT
SUT ETHILON 3 0 PS 1 (SUTURE) IMPLANT
SUT MNCRL AB 3-0 PS2 18 (SUTURE) ×2 IMPLANT
SUT MON AB 2-0 CT1 36 (SUTURE) ×2 IMPLANT
SUT PDS AB 2-0 CT1 27 (SUTURE) IMPLANT
SUT VIC AB 0 CT1 27 (SUTURE)
SUT VIC AB 0 CT1 27XBRD ANBCTR (SUTURE) IMPLANT
SUT VIC AB 2-0 CT1 27 (SUTURE)
SUT VIC AB 2-0 CT1 TAPERPNT 27 (SUTURE) IMPLANT
SYR CONTROL 10ML LL (SYRINGE) IMPLANT
TOWEL GREEN STERILE (TOWEL DISPOSABLE) ×4 IMPLANT
TOWEL GREEN STERILE FF (TOWEL DISPOSABLE) ×4 IMPLANT
TUBE CONNECTING 12X1/4 (SUCTIONS) ×2 IMPLANT
UNDERPAD 30X36 HEAVY ABSORB (UNDERPADS AND DIAPERS) ×2 IMPLANT
WATER STERILE IRR 1000ML POUR (IV SOLUTION) ×4 IMPLANT
YANKAUER SUCT BULB TIP NO VENT (SUCTIONS) ×2 IMPLANT

## 2023-02-01 NOTE — Op Note (Signed)
Orthopaedic Surgery Operative Note (CSN: [id number] ) Date of Surgery: 02/01/2023  Admit Date: 01/30/2023   Diagnoses: Pre-Op Diagnoses: Exposed hardware of left ankle  Post-Op Diagnosis: Same  Procedures: CPT 20680-Removal of hardware right lateral malleolus  Surgeons : Primary: Shona Needles, MD  Assistant: Patrecia Pace, PA-C  Location: OR 3   Anesthesia: Regional with MAC   Antibiotics: Ancef 2g preop with 1 gm vancomycin powder placed topically   Tourniquet time: None used    Estimated Blood Loss: 15 mL  Complications: None  Specimens:None  Implants: * No implants in log *   Indications for Surgery: 89 year old female who had a right distal femur fracture and ankle fracture and underwent open reduction internal fixation in January 2024.  She subsequently had breakdown of her lateral incision of her ankle with exposed hardware.  We are planning to perform removal of hardware on 01/30/2023 but she was found to have a pneumothorax in the preoperative holding area.  A chest tube was placed and as result we postponed it to today.  Discussed risk and benefits with the patient and her son.  Risks included but not limited to bleeding, infection, persistent wound healing problems, displaced fracture, nerve or blood vessel injury, even the possibility anesthetic complications.  They agreed to proceed with surgery and consent was obtained.  Operative Findings: Successful removal of lateral distal fibula plate without complication with primary closure of the open wound.  Procedure: The patient was identified in the preoperative holding area. Consent was confirmed with the patient and their family and all questions were answered. The operative extremity was marked after confirmation with the patient. she was then brought back to the operating room by our anesthesia colleagues.  She was carefully transferred over to radiolucent flattop table.  She was placed under anesthesia.  The  right lower extremity was then prepped and draped in usual sterile fashion.  A timeout was performed to verify the patient, the procedure, and the extremity.  Preoperative antibiotics were dosed.  Fluoroscopic imaging showed the hardware that was in place.  I reopened the lateral incision that was not exposed.  I then spread down to the plate and was able to remove all of the screws without difficulty.  I then used a Cobb elevator to elevate the plate off the bone.  I was able to remove this successfully.  I then used a Cobb elevator to debride the fibrous tissue around the bone.  I then irrigated it with 3 L of normal saline.  I then placed a gram of vancomycin powder after obtaining fluoroscopic imaging to show the hardware was removed.  I then performed a closure with 3-0 nylon suture.  Post Op Plan/Instructions: Patient will be weightbearing as tolerated to the right lower extremity.  I would continue doxycycline for approximately 7 days postop.  We will have her return to the office approximately 2 weeks postop for wound check and possible suture removal.  DVT prophylaxis will be aspirin while in the hospital.  I was present and performed the entire surgery.  Patrecia Pace, PA-C did assist me throughout the case. An assistant was necessary given the difficulty in approach, maintenance of reduction and ability to instrument the fracture.   Katha Hamming, MD [facility]

## 2023-02-01 NOTE — Anesthesia Procedure Notes (Signed)
Anesthesia Regional Block: Adductor canal block   Pre-Anesthetic Checklist: , timeout performed,  Correct Patient, Correct Site, Correct Laterality,  Correct Procedure, Correct Position, site marked,  Risks and benefits discussed,  Surgical consent,  Pre-op evaluation,  At surgeon's request and post-op pain management  Laterality: Right  Prep: Maximum Sterile Barrier Precautions used, chloraprep       Needles:  Injection technique: Single-shot  Needle Type: Echogenic Stimulator Needle     Needle Length: 9cm  Needle Gauge: 22     Additional Needles:   Procedures:,,,, ultrasound used (permanent image in chart),,    Narrative:  Start time: 02/01/2023 11:00 AM End time: 02/01/2023 11:05 AM Injection made incrementally with aspirations every 5 mL.  Performed by: Personally  Anesthesiologist: Pervis Hocking, DO  Additional Notes: Monitors applied. No increased pain on injection. No increased resistance to injection. Injection made in 5cc increments. Good needle visualization. Patient tolerated procedure well.

## 2023-02-01 NOTE — Progress Notes (Signed)
2 Days Post-Op  Subjective: CC: No sob this am.   Chest tube with 395cc output over the last 24 hours. CXR this am pending but appears to have small R apical ptx.   Objective: Vital signs in last 24 hours: Temp:  [98 F (36.7 C)-98.6 F (37 C)] 98 F (36.7 C) (03/22 0740) Pulse Rate:  [61-66] 64 (03/22 0406) Resp:  [16] 16 (03/21 1928) BP: (119-175)/(59-99) 137/67 (03/22 0406) SpO2:  [97 %-100 %] 97 % (03/22 0406) Last BM Date : 01/31/23  Intake/Output from previous day: 03/21 0701 - 03/22 0700 In: 730 [P.O.:480; IV Piggyback:250] Out: [id number] [Urine:1400; Chest Tube:395] Intake/Output this shift: No intake/output data recorded.  PE: Gen:  Alert, NAD, pleasant Card:  Reg Pulm:  CTAB, no W/R/R, effort normal. R CT in place on -20 with no obvious air leak. 395cc/24 hours SS output. 500 on IS.   Lab Results:  Recent Labs    01/30/23 0740 02/01/23 0131  WBC 8.2 8.6  HGB 11.0* 11.5*  HCT 36.1 36.5  PLT 210 237    BMET Recent Labs    01/30/23 0740 02/01/23 0131  NA 135 134*  K 3.8 4.0  CL 97* 96*  CO2 31 28  GLUCOSE 117* 105*  BUN 14 6*  CREATININE 0.51 0.52  CALCIUM 8.2* 8.3*    PT/INR No results for input(s): "LABPROT", "INR" in the last 72 hours. CMP     Component Value Date/Time   NA 134 (L) 02/01/2023 0131   K 4.0 02/01/2023 0131   CL 96 (L) 02/01/2023 0131   CO2 28 02/01/2023 0131   GLUCOSE 105 (H) 02/01/2023 0131   BUN 6 (L) 02/01/2023 0131   CREATININE 0.52 02/01/2023 0131   CREATININE 0.56 (L) 09/12/2022 1429   CALCIUM 8.3 (L) 02/01/2023 0131   PROT 5.6 (L) 02/01/2023 0131   ALBUMIN 2.1 (L) 02/01/2023 0131   AST 18 02/01/2023 0131   ALT 5 02/01/2023 0131   ALKPHOS 83 02/01/2023 0131   BILITOT 0.6 02/01/2023 0131   GFRNONAA >60 02/01/2023 0131   GFRNONAA 75 05/03/2021 1317   GFRAA 86 05/03/2021 1317   Lipase  No results found for: "LIPASE"  Studies/Results: DG CHEST PORT 1 VIEW  Result Date: 01/31/2023 CLINICAL DATA:   Respiratory failure. EXAM: PORTABLE CHEST 1 VIEW COMPARISON:  January 30, 2023. FINDINGS: Stable cardiomediastinal silhouette. Left-sided pacemaker is unchanged. Stable position of right-sided chest tube. No pneumothorax is noted. Mild left basilar subsegmental atelectasis or infiltrate is noted. Bony thorax is unremarkable. IMPRESSION: Stable position of right-sided chest tube. Mild left basilar subsegmental atelectasis or infiltrate. Electronically Signed   By: Marijo Conception M.D.   On: 01/31/2023 08:30   DG Chest Port 1 View  Result Date: 01/30/2023 CLINICAL DATA:  Pneumothorax, post chest tube placement. EXAM: PORTABLE CHEST 1 VIEW COMPARISON:  Earlier same day FINDINGS: Suspected repositioning of right-sided chest tube, now with resolution of previously noted small residual right-sided pneumothorax. Note is made of a minimal amount of right lateral chest wall subcutaneous emphysema. Unchanged cardiac silhouette and mediastinal contours. Left anterior chest wall dual lead pacemaker, unchanged. Improved aeration the right lung with minimal residual right basilar opacities. No new focal airspace opacities. No evidence of edema. No acute osseous abnormalities. Advanced degenerative change the bilateral glenohumeral joints, incompletely evaluated. Post lower lumbar paraspinal fusion, incompletely evaluated. Postcholecystectomy. IMPRESSION: Suspected repositioning of right-sided chest tube, now with resolution of previously noted small right-sided pneumothorax and improved aeration of the  right lung. Electronically Signed   By: Sandi Mariscal M.D.   On: 01/30/2023 15:43   DG CHEST PORT 1 VIEW  Result Date: 01/30/2023 CLINICAL DATA:  Post right sided chest tube placement. EXAM: PORTABLE CHEST 1 VIEW COMPARISON:  Earlier same day FINDINGS: Interval placement right-sided chest tube with no significant change in size of small right-sided pneumothorax. Suspected trace right-sided pleural effusion, unchanged. Unchanged  cardiac silhouette and mediastinal contours post left anterior chest wall dual lead pacemaker. Minimal left basilar opacities favored to represent atelectasis. No evidence of edema. No definite acute osseous abnormalities. Degenerative change the right glenohumeral joint is suspected though incompletely evaluated. Postoperative change of the lower lumbar spine. Postcholecystectomy. IMPRESSION: Interval placement of right-sided chest tube with no significant change in size of small right-sided pneumothorax. Electronically Signed   By: Sandi Mariscal M.D.   On: 01/30/2023 09:58    Anti-infectives: Anti-infectives (From admission, onward)    Start     Dose/Rate Route Frequency Ordered Stop   01/31/23 1000  doxycycline (VIBRA-TABS) tablet 100 mg  Status:  Discontinued        100 mg Oral Every 12 hours 01/31/23 0833 01/31/23 0858   01/30/23 2345  ceFAZolin (ANCEF) IVPB 1 g/50 mL premix        1 g 100 mL/hr over 30 Minutes Intravenous Every 8 hours 01/30/23 2246     01/30/23 0700  ceFAZolin (ANCEF) IVPB 2g/100 mL premix  Status:  Discontinued        2 g 200 mL/hr over 30 Minutes Intravenous On call to O.R. 01/30/23 0651 01/30/23 1011        Assessment/Plan 11F with spontaneous R PTX - S/p Chest tube on 3/20 by Dr. Bobbye Morton. There was persistent ptx after chest tube placement so suction was increased to -40. Follow up films with resolution of ptx. CT currently on -20 with no air leak. CXR this am with small R apical PTX. Keep on -20 today. Repeat CXR in am. Monitor output. Pulm toilet.   S/p ORIF R ankle fracture 12/07/22 - Per ortho. On abx. Plans for OR today .  PAF, HTN, and other chronic medical conditions - per primary  I reviewed nursing notes, Consultant (Ortho) notes, hospitalist notes, last 24 h vitals and pain scores, last 48 h intake and output, last 24 h labs and trends, and last 24 h imaging results.    LOS: 2 days    Jillyn Ledger , [facility] 02/01/2023, 8:12  AM Please see Amion for pager number during day hours 7:00am-4:30pm

## 2023-02-01 NOTE — Anesthesia Procedure Notes (Signed)
Anesthesia Regional Block: Popliteal block   Pre-Anesthetic Checklist: , timeout performed,  Correct Patient, Correct Site, Correct Laterality,  Correct Procedure, Correct Position, site marked,  Risks and benefits discussed,  Surgical consent,  Pre-op evaluation,  At surgeon's request and post-op pain management  Laterality: Right  Prep: Maximum Sterile Barrier Precautions used, chloraprep       Needles:  Injection technique: Single-shot  Needle Type: Echogenic Stimulator Needle     Needle Length: 9cm  Needle Gauge: 22     Additional Needles:   Procedures:,,,, ultrasound used (permanent image in chart),,    Narrative:  Start time: 02/01/2023 10:55 AM End time: 02/01/2023 11:00 AM Injection made incrementally with aspirations every 5 mL.  Performed by: Personally  Anesthesiologist: Pervis Hocking, DO  Additional Notes: Monitors applied. No increased pain on injection. No increased resistance to injection. Injection made in 5cc increments. Good needle visualization. Patient tolerated procedure well.

## 2023-02-01 NOTE — Anesthesia Preprocedure Evaluation (Addendum)
Anesthesia Evaluation  Patient identified by MRN, date of birth, ID band Patient awake    Reviewed: Allergy & Precautions, NPO status , Patient's Chart, lab work & pertinent test results, reviewed documented beta blocker date and time   Airway Mallampati: III  TM Distance: >3 FB Neck ROM: Full    Dental  (+) Poor Dentition, Dental Advisory Given   Pulmonary COPD,  COPD inhaler, Patient abstained from smoking. Acute pneumothorax on admission, SOB: s/p chest tube by trauma but no change in PTX on CXR    + decreased breath sounds      Cardiovascular hypertension (155/76 preop), Pt. on medications and Pt. on home beta blockers + dysrhythmias (eliquis) Atrial Fibrillation + pacemaker  Rhythm:Irregular Rate:Normal     Neuro/Psych negative neurological ROS  negative psych ROS   GI/Hepatic negative GI ROS, Neg liver ROS,,,  Endo/Other  negative endocrine ROS    Renal/GU negative Renal ROS  negative genitourinary   Musculoskeletal  (+) Arthritis , Osteoarthritis,    Abdominal   Peds  Hematology Hb 11.5, plt 237   Anesthesia Other Findings   Reproductive/Obstetrics negative OB ROS                             Anesthesia Physical Anesthesia Plan  ASA: 4  Anesthesia Plan: MAC and Regional   Post-op Pain Management: Regional block* and Tylenol PO (pre-op)*   Induction:   PONV Risk Score and Plan: 2 and Propofol infusion and TIVA  Airway Management Planned: Natural Airway and Simple Face Mask  Additional Equipment: None  Intra-op Plan:   Post-operative Plan:   Informed Consent: I have reviewed the patients History and Physical, chart, labs and discussed the procedure including the risks, benefits and alternatives for the proposed anesthesia with the patient or authorized representative who has indicated his/her understanding and acceptance.   Patient has DNR.  Continue DNR, Discussed DNR  with power of attorney and Discussed DNR with patient.   Consent reviewed with POA and Dental advisory given  Plan Discussed with: CRNA  Anesthesia Plan Comments:         Anesthesia Quick Evaluation

## 2023-02-01 NOTE — Progress Notes (Signed)
Progress Note   Patient: [name] [id number] DOB: 1923/04/08 DOA: 01/30/2023     2 DOS: the patient was seen and examined on 02/01/2023   Brief hospital course: 89 y.o. female with medical history significant of hypertension, atrial fibrillation on Eliquis, COPD, arthritis, and anemia who presented for postoperative wound infection.  Patient has sustained a right ankle fracture after a fall back  and underwent fourth of both the distal femur and right bimalleolar ankle fracture by Dr. Doreatha Martin on 1/26.  She was placed in a walking cam boot postoperative which resulted in small wound forming on the lateral incision noted during postoperative follow-up.  She had been instructed to take off the cam boot off.  However, over the last 4 weeks wound on the lateral malleolus that not healed and was draining.  Despite oral antibiotics for at least 2 weeks patient had no improvement in the wound.  Plan was for her to be taken for removal of the hardware with Dr. Doreatha Martin.   Preoperative labs noted WBC 8.62, hemoglobin 11 and calcium 8.2.  Patient had been noted to have increased shortness of breath.  Chest x-ray had been obtained which revealed interval development of a small right-sided pneumothorax with similar findings of lung hyperexpansion and chronic bronchiectatic change.  Surgery had been canceled and due to these findings and trauma surgery had been consulted for need of chest tube.  Assessment and Plan: Postoperative wound infection of the right ankle Patient had ORIF of a right ankle fracture sustained after a fall by Dr. Doreatha Martin on 1/26.  Postoperatively patient had been placed in a cam boot which resulted in wound malleolus.  Since that time wound has failed to heal and started draining fluid. -Continued cefazolin -Underwent removal of hardware involving R lateral malleolus 3/22 per Ortho   Pneumothorax Acute.  Patient was noted to have plaints of shortness of breath.  Chest x-ray had noted  a small right pneumothorax.  Trauma surgery has been consulted and placed chest tube.  -continue chest tube management per Surgery   Paroxysmal atrial fibrillation on chronic anticoagulation S/p PM Patient currently in sinus rhythm.   -Hold Eliquis for now, ultimately would resume once OK with Surgery and Ortho   Essential hypertension -Continue home blood pressure regimen as tolerated -Continue PRN hydralazine      Subjective: Seen prior to surgery. Pt in good spirits. Without complaints  Physical Exam: Vitals:   02/01/23 1315 02/01/23 1330 02/01/23 1345 02/01/23 1410  BP: (!) 148/72 (!) 150/65 (!) 158/70 (!) 133/112  Pulse: 63 61 64 63  Resp: 14 12 15 18   Temp: (!) 97.5 F (36.4 C)   97.7 F (36.5 C)  TempSrc:    Oral  SpO2: 96% 94% 93% 95%  Weight:      Height:       General exam: Conversant, in no acute distress Respiratory system: normal chest rise, clear, no audible wheezing Cardiovascular system: regular rhythm, s1-s2 Gastrointestinal system: Nondistended, nontender, pos BS Central nervous system: No seizures, no tremors Extremities: No cyanosis, no joint deformities Skin: No rashes, no pallor Psychiatry: Affect normal // no auditory hallucinations   Data Reviewed:  Labs reviewed: Na 134, K 4.0, Cr 0.52, WBC 8.6, Hgb 11.5  Family Communication: Pt in room, family not at bedside  Disposition: Status is: Inpatient Remains inpatient appropriate because: Severity of illness  Planned Discharge Destination: Home    Author: Marylu Lund, MD 02/01/2023 5:19 PM  For on call review [email or web address].

## 2023-02-01 NOTE — Anesthesia Procedure Notes (Signed)
Procedure Name: MAC Date/Time: 02/01/2023 12:10 PM  Performed by: Michele Rockers, CRNAPre-anesthesia Checklist: Patient identified, Emergency Drugs available, Suction available, Timeout performed and Patient being monitored Patient Re-evaluated:Patient Re-evaluated prior to induction Oxygen Delivery Method: Simple face mask

## 2023-02-01 NOTE — Progress Notes (Signed)
PT Cancellation Note  Patient Details Name: [name] MRN: [id number] DOB: 07-03-1923   Cancelled Treatment:    Reason Eval/Treat Not Completed: Patient declined, no reason specified  Post-op follow up. Patient requests hold evaluation at this time. Has recently returned from procedure. She is agreeable to PT evaluation tomorrow morning. Will follow up tomorrow then.  Candie Mile, PT, DPT Physical Therapist Acute Rehabilitation Services [facility] 02/01/2023, 4:01 PM

## 2023-02-01 NOTE — Progress Notes (Signed)
OT Cancellation Note  Patient Details Name: [name] MRN: [id number] DOB: 1923-05-14   Cancelled Treatment:    Reason Eval/Treat Not Completed: Patient at procedure or test/ unavailable (In surgery. Will return as medically appropriate)  Elder Cyphers, OTR/L [facility] Acute Rehabilitation Office: [phone]   Magnus Ivan 02/01/2023, 11:02 AM

## 2023-02-01 NOTE — Transfer of Care (Signed)
Immediate Anesthesia Transfer of Care Note  Patient: [name]  Procedure(s) Performed: HARDWARE REMOVAL ANKLE (Right: Ankle)  Patient Location: PACU  Anesthesia Type:MAC combined with regional for post-op pain  Level of Consciousness: awake, alert , and patient cooperative  Airway & Oxygen Therapy: Patient Spontanous Breathing and Patient connected to nasal cannula oxygen  Post-op Assessment: Report given to RN and Post -op Vital signs reviewed and stable  Post vital signs: Reviewed and stable  Last Vitals:  Vitals Value Taken Time  BP    Temp    Pulse    Resp    SpO2      Last Pain:  Vitals:   02/01/23 1108  TempSrc:   PainSc: 0-No pain         Complications: No notable events documented.

## 2023-02-01 NOTE — Interval H&P Note (Signed)
History and Physical Interval Note:  02/01/2023 11:12 AM  [name]  has presented today for surgery, with the diagnosis of Exposed hardware.  The various methods of treatment have been discussed with the patient and family. After consideration of risks, benefits and other options for treatment, the patient has consented to  Procedure(s): HARDWARE REMOVAL ANKLE (Right) as a surgical intervention.  The patient's history has been reviewed, patient examined, no change in status, stable for surgery.  I have reviewed the patient's chart and labs.  Questions were answered to the patient's satisfaction.     Lennette Bihari P Latha Staunton

## 2023-02-01 NOTE — Anesthesia Postprocedure Evaluation (Signed)
Anesthesia Post Note  Patient: Anapaula H Fleischhacker  Procedure(s) Performed: HARDWARE REMOVAL ANKLE (Right: Ankle)     Patient location during evaluation: PACU Anesthesia Type: Regional and MAC Level of consciousness: awake and alert Pain management: pain level controlled Vital Signs Assessment: post-procedure vital signs reviewed and stable Respiratory status: spontaneous breathing, nonlabored ventilation and respiratory function stable Cardiovascular status: blood pressure returned to baseline and stable Postop Assessment: no apparent nausea or vomiting Anesthetic complications: no   No notable events documented.  Last Vitals:  Vitals:   02/01/23 1345 02/01/23 1410  BP: (!) 158/70 (!) 133/112  Pulse: 64 63  Resp: 15 18  Temp:  36.5 C  SpO2: 93% 95%    Last Pain:  Vitals:   02/01/23 1410  TempSrc: Oral  PainSc: 0-No pain                 Pervis Hocking

## 2023-02-01 NOTE — Progress Notes (Signed)
PT Cancellation Note  Patient Details Name: [name] MRN: [id number] DOB: Jan 07, 1923   Cancelled Treatment:    Reason Eval/Treat Not Completed: Patient at procedure or test/unavailable  Taken to OR for surgery. Will continue to follow and perform PT evaluation when ready.   Ellouise Newer 02/01/2023, 12:13 PM

## 2023-02-02 ENCOUNTER — Encounter (HOSPITAL_COMMUNITY): Payer: Self-pay | Admitting: Student

## 2023-02-02 ENCOUNTER — Inpatient Hospital Stay (HOSPITAL_COMMUNITY): Payer: Medicare Other

## 2023-02-02 DIAGNOSIS — Z01818 Encounter for other preprocedural examination: Secondary | ICD-10-CM | POA: Diagnosis not present

## 2023-02-02 DIAGNOSIS — D62 Acute posthemorrhagic anemia: Secondary | ICD-10-CM | POA: Diagnosis not present

## 2023-02-02 DIAGNOSIS — T8149XA Infection following a procedure, other surgical site, initial encounter: Secondary | ICD-10-CM | POA: Diagnosis not present

## 2023-02-02 LAB — COMPREHENSIVE METABOLIC PANEL
ALT: <5 U/L (ref 0–44)
AST: 18 U/L (ref 15–41)
Albumin: 2 g/dL — ABNORMAL LOW (ref 3.5–5.0)
Alkaline Phosphatase: 76 U/L (ref 38–126)
Anion gap: 6 (ref 5–15)
BUN: 14 mg/dL (ref 8–23)
CO2: 26 mmol/L (ref 22–32)
Calcium: 7.9 mg/dL — ABNORMAL LOW (ref 8.9–10.3)
Chloride: 98 mmol/L (ref 98–111)
Creatinine, Ser: 0.67 mg/dL (ref 0.44–1.00)
GFR, Estimated: >60 mL/min (ref >60)
Glucose, Bld: 143 mg/dL — ABNORMAL HIGH (ref 70–99)
Potassium: 4.2 mmol/L (ref 3.5–5.1)
Sodium: 130 mmol/L — ABNORMAL LOW (ref 135–145)
Total Bilirubin: 0.6 mg/dL (ref 0.3–1.2)
Total Protein: 5.5 g/dL — ABNORMAL LOW (ref 6.5–8.1)

## 2023-02-02 LAB — CBC
HCT: 33.8 % — ABNORMAL LOW (ref 36.0–46.0)
Hemoglobin: 11 g/dL — ABNORMAL LOW (ref 12.0–15.0)
MCH: 32.6 pg (ref 26.0–34.0)
MCHC: 32.5 g/dL (ref 30.0–36.0)
MCV: 100.3 fL — ABNORMAL HIGH (ref 80.0–100.0)
Platelets: 240 10*3/uL (ref 150–400)
RBC: 3.37 MIL/uL — ABNORMAL LOW (ref 3.87–5.11)
RDW: 15.1 % (ref 11.5–15.5)
WBC: 5.9 10*3/uL (ref 4.0–10.5)
nRBC: 0 % (ref 0.0–0.2)

## 2023-02-02 MED ORDER — SODIUM CHLORIDE 0.9 % IV SOLN: INTRAVENOUS | Status: AC

## 2023-02-02 MED ORDER — GUAIFENESIN 200 MG PO TABS: 200.0000 mg | ORAL_TABLET | ORAL | Status: DC | PRN

## 2023-02-02 MED ORDER — APIXABAN 2.5 MG PO TABS
2.5000 mg | ORAL_TABLET | Freq: Two times a day (BID) | ORAL | Status: DC
  Administered 2023-02-02 – 2023-02-05 (×7): 2.5 mg via ORAL
  Filled 2023-02-02 (×7): qty 1
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
## 2023-02-02 NOTE — Progress Notes (Signed)
[facility] Progress Note  1 Day Post-Op  Subjective: CC-  Sitting up in bed, just finished eating. No complaints. Denies CP or SOB. Pulling about 500 on IS.  CXR this AM without pneumothorax.  Objective: Vital signs in last 24 hours: Temp:  [97.5 F (36.4 C)-98.2 F (36.8 C)] 97.6 F (36.4 C) (03/23 0756) Pulse Rate:  [61-67] 63 (03/22 2044) Resp:  [12-21] 16 (03/23 0412) BP: (112-158)/(60-112) 112/60 (03/23 0756) SpO2:  [93 %-100 %] 97 % (03/22 2044) Last BM Date : 02/01/23  Intake/Output from previous day: 03/22 0701 - 03/23 0700 In: 300 [I.V.:300] Out: 95 [Blood:25; Chest Tube:70] Intake/Output this shift: No intake/output data recorded.  PE: Gen:  Alert, NAD, pleasant Card:  Reg Pulm:  CTAB, no W/R/R, effort normal. R CT in place on -20 with no obvious air leak. 70cc/24 hours SS output. 500 on IS.   Lab Results:  Recent Labs    02/01/23 0131 02/02/23 0230  WBC 8.6 5.9  HGB 11.5* 11.0*  HCT 36.5 33.8*  PLT 237 240   BMET Recent Labs    02/01/23 0131 02/02/23 0230  NA 134* 130*  K 4.0 4.2  CL 96* 98  CO2 28 26  GLUCOSE 105* 143*  BUN 6* 14  CREATININE 0.52 0.67  CALCIUM 8.3* 7.9*   PT/INR No results for input(s): "LABPROT", "INR" in the last 72 hours. CMP     Component Value Date/Time   NA 130 (L) 02/02/2023 0230   K 4.2 02/02/2023 0230   CL 98 02/02/2023 0230   CO2 26 02/02/2023 0230   GLUCOSE 143 (H) 02/02/2023 0230   BUN 14 02/02/2023 0230   CREATININE 0.67 02/02/2023 0230   CREATININE 0.56 (L) 09/12/2022 1429   CALCIUM 7.9 (L) 02/02/2023 0230   PROT 5.5 (L) 02/02/2023 0230   ALBUMIN 2.0 (L) 02/02/2023 0230   AST 18 02/02/2023 0230   ALT <5 02/02/2023 0230   ALKPHOS 76 02/02/2023 0230   BILITOT 0.6 02/02/2023 0230   GFRNONAA >60 02/02/2023 0230   GFRNONAA 75 05/03/2021 1317   GFRAA 86 05/03/2021 1317   Lipase  No results found for: "LIPASE"     Studies/Results: DG CHEST PORT 1 VIEW  Result Date:  02/02/2023 CLINICAL DATA:  [id number] with pneumothorax. EXAM: PORTABLE CHEST 1 VIEW COMPARISON:  Portable chest yesterday at 5:56 a.m. FINDINGS: 6:52 a.m. no right mid chest pigtail tube thoracostomy remains unchanged in position. There is no visible pneumothorax. There is near resolution of bilateral interstitial infiltrates compared with yesterday's exam which could be due to clearing edema and/or pneumonia. There are minimal pleural effusions, unchanged. Stable cardiomegaly and pacemaker. Stable mediastinum with aortic atherosclerosis and tortuosity. Central vessels are normal caliber. Thoracic cage is intact with osteopenia. IMPRESSION: 1. No visible pneumothorax. 2. Near resolution of bilateral interstitial infiltrates compared with yesterday's exam which could be clearing edema and/or pneumonia. 3. Stable cardiomegaly and aortic atherosclerosis. 4. Normal caliber central vessels compared to yesterday's film. Electronically Signed   By: Telford Nab M.D.   On: 02/02/2023 07:56   DG Ankle Complete Right  Result Date: 02/01/2023 CLINICAL DATA:  Fluoro guidance provided EXAM: RIGHT ANKLE - COMPLETE 3+ VIEW; DG C-ARM 1-60 MIN-NO REPORT FINDINGS: Dose: 0.1 mGy Fluoro time 4s IMPRESSION: C-arm fluoro guidance provided. Electronically Signed   By: Sammie Bench M.D.   On: 02/01/2023 15:47   DG C-Arm 1-60 Min-No Report  Result Date: 02/01/2023 Fluoroscopy was utilized by the requesting physician.  No radiographic  interpretation.   DG CHEST PORT 1 VIEW  Result Date: 02/01/2023 CLINICAL DATA:  Pneumothorax EXAM: PORTABLE CHEST 1 VIEW COMPARISON:  Portable exam 0556 hours compared to 01/31/2023 FINDINGS: Pigtail RIGHT thoracostomy tube unchanged. LEFT subclavian sequential transvenous pacemaker leads project at RIGHT atrium and RIGHT ventricle. Enlargement of cardiac silhouette with pulmonary vascular congestion. Atherosclerotic calcification aorta. Scattered interstitial infiltrates greatest at LEFT base.  Trace RIGHT apex pneumothorax. No acute osseous findings. IMPRESSION: Patchy BILATERAL pulmonary infiltrates. Trace RIGHT apex pneumothorax. Electronically Signed   By: Lavonia [name] M.D.   On: 02/01/2023 09:08    Anti-infectives: Anti-infectives (From admission, onward)    Start     Dose/Rate Route Frequency Ordered Stop   02/01/23 2200  doxycycline (VIBRA-TABS) tablet 100 mg        100 mg Oral Every 12 hours 02/01/23 1403     02/01/23 1235  vancomycin (VANCOCIN) powder  Status:  Discontinued          As needed 02/01/23 1237 02/01/23 1302   01/31/23 1000  doxycycline (VIBRA-TABS) tablet 100 mg  Status:  Discontinued        100 mg Oral Every 12 hours 01/31/23 0833 01/31/23 0858   01/30/23 2345  ceFAZolin (ANCEF) IVPB 1 g/50 mL premix  Status:  Discontinued        1 g 100 mL/hr over 30 Minutes Intravenous Every 8 hours 01/30/23 2246 02/01/23 1403   01/30/23 0700  ceFAZolin (ANCEF) IVPB 2g/100 mL premix  Status:  Discontinued        2 g 200 mL/hr over 30 Minutes Intravenous On call to O.R. 01/30/23 0651 01/30/23 1011        Assessment/Plan 63F with spontaneous R PTX - S/p Chest tube on 3/20 by Dr. Bobbye Morton. CXR this AM negative for pneumothorax. Water seal chest tube today and repeat CXR in AM. Pulm toilet/IS, will add flutter valve.   S/p ORIF R ankle fracture 12/07/22, [place] removal 02/01/23 - Per ortho. On abx. WBAT RLE PAF, HTN, and other chronic medical conditions - per primary    ID - doxycycline FEN - HH diet VTE - eliquis Foley - none  I reviewed [facility]ant orthopedics notes, last 24 h vitals and pain scores, last 48 h intake and output, last 24 h labs and trends, and last 24 h imaging results.    LOS: 3 days    [facility] 02/02/2023, 11:13 AM Please see Amion for pager number during day hours 7:00am-4:30pm
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
## 2023-02-02 NOTE — Progress Notes (Signed)
Progress Note   Patient: [name] [id number] DOB: 02-22-1923 DOA: 01/30/2023     3 DOS: the patient was seen and examined on 02/02/2023   Brief hospital course: 89 y.o. female with medical history significant of hypertension, atrial fibrillation on Eliquis, COPD, arthritis, and anemia who presented for postoperative wound infection.  Patient has sustained a right ankle fracture after a fall back  and underwent fourth of both the distal femur and right bimalleolar ankle fracture by Dr. Doreatha Martin on 1/26.  She was placed in a walking cam boot postoperative which resulted in small wound forming on the lateral incision noted during postoperative follow-up.  She had been instructed to take off the cam boot off.  However, over the last 4 weeks wound on the lateral malleolus that not healed and was draining.  Despite oral antibiotics for at least 2 weeks patient had no improvement in the wound.  Plan was for her to be taken for removal of the hardware with Dr. Doreatha Martin.   Preoperative labs noted WBC 8.62, hemoglobin 11 and calcium 8.2.  Patient had been noted to have increased shortness of breath.  Chest x-ray had been obtained which revealed interval development of a small right-sided pneumothorax with similar findings of lung hyperexpansion and chronic bronchiectatic change.  Surgery had been canceled and due to these findings and trauma surgery had been consulted for need of chest tube.  Assessment and Plan: Postoperative wound infection of the right ankle Patient had ORIF of a right ankle fracture sustained after a fall by Dr. Doreatha Martin on 1/26.  Postoperatively patient had been placed in a cam boot which resulted in wound malleolus.  Since that time wound has failed to heal and started draining fluid. -Continued cefazolin -Underwent removal of hardware involving R lateral malleolus 3/22 per Ortho   Pneumothorax Acute.  Patient was noted to have plaints of shortness of breath.  Chest x-ray had noted  a small right pneumothorax.  Trauma surgery has been consulted and placed chest tube.  -continue chest tube management per Surgery   Paroxysmal atrial fibrillation on chronic anticoagulation S/p PM Patient currently in sinus rhythm.   -Hold Eliquis for now, ultimately would resume once OK with Surgery and Ortho   Essential hypertension -Continue home blood pressure regimen as tolerated -Continue PRN hydralazine  Hyponatremia -Clinically appears dry -Will start NS at 75cc/hr x 10hrs -recheck bmet in AM      Subjective: Admits to not eating very well. Denies chest pain or sob  Physical Exam: Vitals:   02/01/23 2043 02/01/23 2044 02/02/23 0412 02/02/23 0756  BP: 136/81 136/81 130/80 112/60  Pulse: 62 63    Resp:  17 16   Temp:  98.2 F (36.8 C) 98.1 F (36.7 C) 97.6 F (36.4 C)  TempSrc:  Oral Axillary   SpO2:  97%    Weight:      Height:       General exam: Awake, laying in bed, in nad Respiratory system: Normal respiratory effort, no wheezing Cardiovascular system: regular rate, s1, s2 Gastrointestinal system: Soft, nondistended, positive BS Central nervous system: CN2-12 grossly intact, strength intact Extremities: Perfused, no clubbing Skin: Normal skin turgor, no notable skin lesions seen Psychiatry: Mood normal // no visual hallucinations   Data Reviewed:  Labs reviewed: Na 130, K 4.2, Cr 0.67, WBC 5.9, Hgb 11.0  Family Communication: Pt in room, family not at bedside  Disposition: Status is: Inpatient Remains inpatient appropriate because: Severity of illness  Planned Discharge Destination:  Skilled nursing facility    Author: Marylu Lund, MD 02/02/2023 3:31 PM  For on call review [email or web address].

## 2023-02-02 NOTE — Progress Notes (Signed)
Fracture Care Post-op Anticoagulation Consult Note  Brief Assessment: 70 yof on apixaban PTA for Afib s/p right lateral malleolus hardware removal on 3/22. Pharmacy consulted to resume apixaban if POD 1 Hgb >=9.  Hgb is 11 and platelets are normal today. No bleeding noted.  Plan: Resume apixaban 2.5 mg PO bid Pharmacy signing off but will continue to follow peripherally - please re-consult if needed  Thank you for involving pharmacy in this patient's care.  Renold Genta, PharmD, BCPS Clinical Pharmacist Clinical phone for 02/02/2023 is [phone] 02/02/2023 8:33 AM

## 2023-02-02 NOTE — Progress Notes (Signed)
Subjective: Patient reports pain as mild. Poor historian but son present in the room to help. Tolerating diet.  Urinating.   No CP, SOB.  Hasn't mobilized much OOB with PT/OT.  Objective:   VITALS:   Vitals:   02/01/23 2043 02/01/23 2044 02/02/23 0412 02/02/23 0756  BP: 136/81 136/81 130/80 112/60  Pulse: 62 63    Resp:  17 16   Temp:  98.2 F (36.8 C) 98.1 F (36.7 C) 97.6 F (36.4 C)  TempSrc:  Oral Axillary   SpO2:  97%    Weight:      Height:          Latest Ref Rng & Units 02/02/2023    2:30 AM 02/01/2023    1:31 AM 01/30/2023    7:40 AM  CBC  WBC 4.0 - 10.5 K/uL 5.9  8.6  8.2   Hemoglobin 12.0 - 15.0 g/dL 11.0  11.5  11.0   Hematocrit 36.0 - 46.0 % 33.8  36.5  36.1   Platelets 150 - 400 K/uL 240  237  210       Latest Ref Rng & Units 02/02/2023    2:30 AM 02/01/2023    1:31 AM 01/30/2023    7:40 AM  BMP  Glucose 70 - 99 mg/dL 143  105  117   BUN 8 - 23 mg/dL 14  6  14    Creatinine 0.44 - 1.00 mg/dL 0.67  0.52  0.51   Sodium 135 - 145 mmol/L 130  134  135   Potassium 3.5 - 5.1 mmol/L 4.2  4.0  3.8   Chloride 98 - 111 mmol/L 98  96  97   CO2 22 - 32 mmol/L 26  28  31    Calcium 8.9 - 10.3 mg/dL 7.9  8.3  8.2    Intake/Output      03/22 0701 03/23 0700 03/23 0701 03/24 0700   P.O.     I.V. (mL/kg) 300 (4.7)    IV Piggyback     Total Intake(mL/kg) 300 (4.7)    Urine (mL/kg/hr)     Stool     Blood 25    Chest Tube 70    Total Output 95    Net +205            Physical Exam: General: NAD.  Sitting up in bed, calm, comfortable, pleasant Resp: No increased wob Cardio: regular rate and rhythm ABD soft Neurologically intact MSK Neurovascularly intact Sensation intact distally Intact pulses distally Dorsiflexion/Plantar flexion intact but decreased Having trouble moving her toes Incision: dressing had blood saturated through ACE wrap and into bed linen R Chest tube in place   Assessment: 1 Day Post-Op  S/P Procedure(s) (LRB): HARDWARE  REMOVAL ANKLE (Right) by Dr. Doreatha Martin on 02/01/23  Principal Problem:   Postoperative wound infection Active Problems:   Essential hypertension   Atrial fibrillation (HCC)   Cardiac pacemaker   Pneumothorax   Plan: Doxycycline bid x 7 days  Advance diet Up with therapy Incentive Spirometry Elevate and Apply ice  Weightbearing: WBAT RLE Insicional and dressing care: I removed all the old dressings and replaced with new clean ones, Reinforce dressings as needed Orthopedic device(s): None Showering: Keep dressing dry VTE prophylaxis:  Eliquis restarted today  , SCDs, ambulation Pain control: limit narcotics as able due to age Follow - up plan: 2 weeks   Dispo:  Will need SNF upon d/c when medically ready.      Raine Elsass Georgianne Fick,  PA-C Office [phone] 02/02/2023, 11:52 AM

## 2023-02-03 ENCOUNTER — Inpatient Hospital Stay (HOSPITAL_COMMUNITY): Payer: Medicare Other

## 2023-02-03 DIAGNOSIS — T8149XA Infection following a procedure, other surgical site, initial encounter: Secondary | ICD-10-CM | POA: Diagnosis not present

## 2023-02-03 DIAGNOSIS — Z01818 Encounter for other preprocedural examination: Secondary | ICD-10-CM | POA: Diagnosis not present

## 2023-02-03 DIAGNOSIS — D62 Acute posthemorrhagic anemia: Secondary | ICD-10-CM | POA: Diagnosis not present

## 2023-02-03 LAB — COMPREHENSIVE METABOLIC PANEL
ALT: <5 U/L (ref 0–44)
AST: 20 U/L (ref 15–41)
Albumin: 2 g/dL — ABNORMAL LOW (ref 3.5–5.0)
Alkaline Phosphatase: 74 U/L (ref 38–126)
Anion gap: 6 (ref 5–15)
BUN: 18 mg/dL (ref 8–23)
CO2: 28 mmol/L (ref 22–32)
Calcium: 8 mg/dL — ABNORMAL LOW (ref 8.9–10.3)
Chloride: 99 mmol/L (ref 98–111)
Creatinine, Ser: 0.7 mg/dL (ref 0.44–1.00)
GFR, Estimated: >60 mL/min (ref >60)
Glucose, Bld: 97 mg/dL (ref 70–99)
Potassium: 4.2 mmol/L (ref 3.5–5.1)
Sodium: 133 mmol/L — ABNORMAL LOW (ref 135–145)
Total Bilirubin: 0.7 mg/dL (ref 0.3–1.2)
Total Protein: 5.4 g/dL — ABNORMAL LOW (ref 6.5–8.1)

## 2023-02-03 LAB — CBC
HCT: 32.5 % — ABNORMAL LOW (ref 36.0–46.0)
Hemoglobin: 10.5 g/dL — ABNORMAL LOW (ref 12.0–15.0)
MCH: 32.9 pg (ref 26.0–34.0)
MCHC: 32.3 g/dL (ref 30.0–36.0)
MCV: 101.9 fL — ABNORMAL HIGH (ref 80.0–100.0)
Platelets: 230 10*3/uL (ref 150–400)
RBC: 3.19 MIL/uL — ABNORMAL LOW (ref 3.87–5.11)
RDW: 15.2 % (ref 11.5–15.5)
WBC: 8 10*3/uL (ref 4.0–10.5)
nRBC: 0 % (ref 0.0–0.2)

## 2023-02-03 NOTE — Progress Notes (Signed)
2 Days Post-Op   Subjective/Chief Complaint: Sitting up eating breakfast Denies SOB   Objective: Vital signs in last 24 hours: Temp:  [97.7 F (36.5 C)-98.3 F (36.8 C)] 97.7 F (36.5 C) (03/24 0718) Pulse Rate:  [59-65] 65 (03/24 0718) Resp:  [12-22] 13 (03/24 0517) BP: (110-143)/(59-72) 143/71 (03/24 0718) SpO2:  [92 %-100 %] 100 % (03/24 0718) Last BM Date : 02/01/23  Intake/Output from previous day: 03/23 0701 - 03/24 0700 In: 554.7 [P.O.:480; I.V.:74.7] Out: 1360 [Urine:1300; Chest Tube:60] Intake/Output this shift: No intake/output data recorded.  Exam: Awake and alert No resp distress, no audible wheezing CT with 60 cc out last 24 hours No air leak   Lab Results:  Recent Labs    02/02/23 0230 02/03/23 0241  WBC 5.9 8.0  HGB 11.0* 10.5*  HCT 33.8* 32.5*  PLT 240 230   BMET Recent Labs    02/02/23 0230 02/03/23 0241  NA 130* 133*  K 4.2 4.2  CL 98 99  CO2 26 28  GLUCOSE 143* 97  BUN 14 18  CREATININE 0.67 0.70  CALCIUM 7.9* 8.0*   PT/INR No results for input(s): "LABPROT", "INR" in the last 72 hours. ABG No results for input(s): "PHART", "HCO3" in the last 72 hours.  Invalid input(s): "PCO2", "PO2"  Studies/Results: DG CHEST PORT 1 VIEW  Result Date: 02/03/2023 CLINICAL DATA:  [id number] Chest tube in place [id number] EXAM: PORTABLE CHEST - 1 VIEW COMPARISON:  the previous day's study FINDINGS: Low lung volumes. Stable right chest tube with no definite pneumothorax although the apex is obscured overlying mandible. Coarse pulmonary parenchymal markings as before. Stable left subclavian dual lead pacemaker. Heart size and mediastinal contours are within normal limits. Aortic Atherosclerosis (ICD10-170.0). No effusion. Lumbar fixation hardware partially visualized. Bilateral shoulder DJD. IMPRESSION: Stable right chest tube. No acute findings. Electronically Signed   By: Lucrezia Europe M.D.   On: 02/03/2023 08:52   DG CHEST PORT 1 VIEW  Result Date:  02/02/2023 CLINICAL DATA:  [id number] with pneumothorax. EXAM: PORTABLE CHEST 1 VIEW COMPARISON:  Portable chest yesterday at 5:56 a.m. FINDINGS: 6:52 a.m. no right mid chest pigtail tube thoracostomy remains unchanged in position. There is no visible pneumothorax. There is near resolution of bilateral interstitial infiltrates compared with yesterday's exam which could be due to clearing edema and/or pneumonia. There are minimal pleural effusions, unchanged. Stable cardiomegaly and pacemaker. Stable mediastinum with aortic atherosclerosis and tortuosity. Central vessels are normal caliber. Thoracic cage is intact with osteopenia. IMPRESSION: 1. No visible pneumothorax. 2. Near resolution of bilateral interstitial infiltrates compared with yesterday's exam which could be clearing edema and/or pneumonia. 3. Stable cardiomegaly and aortic atherosclerosis. 4. Normal caliber central vessels compared to yesterday's film. Electronically Signed   By: Telford Nab M.D.   On: 02/02/2023 07:56   DG Ankle Complete Right  Result Date: 02/01/2023 CLINICAL DATA:  Fluoro guidance provided EXAM: RIGHT ANKLE - COMPLETE 3+ VIEW; DG C-ARM 1-60 MIN-NO REPORT FINDINGS: Dose: 0.1 mGy Fluoro time 4s IMPRESSION: C-arm fluoro guidance provided. Electronically Signed   By: Sammie Bench M.D.   On: 02/01/2023 15:47   DG C-Arm 1-60 Min-No Report  Result Date: 02/01/2023 Fluoroscopy was utilized by the requesting physician.  No radiographic interpretation.    Anti-infectives: Anti-infectives (From admission, onward)    Start     Dose/Rate Route Frequency Ordered Stop   02/01/23 2200  doxycycline (VIBRA-TABS) tablet 100 mg        100 mg Oral Every 12 hours  02/01/23 1403     02/01/23 1235  vancomycin (VANCOCIN) powder  Status:  Discontinued          As needed 02/01/23 1237 02/01/23 1302   01/31/23 1000  doxycycline (VIBRA-TABS) tablet 100 mg  Status:  Discontinued        100 mg Oral Every 12 hours 01/31/23 0833 01/31/23 0858    01/30/23 2345  ceFAZolin (ANCEF) IVPB 1 g/50 mL premix  Status:  Discontinued        1 g 100 mL/hr over 30 Minutes Intravenous Every 8 hours 01/30/23 2246 02/01/23 1403   01/30/23 0700  ceFAZolin (ANCEF) IVPB 2g/100 mL premix  Status:  Discontinued        2 g 200 mL/hr over 30 Minutes Intravenous On call to O.R. 01/30/23 0651 01/30/23 1011       Assessment/Plan: 23F with spontaneous R PTX - S/p Chest tube on 3/20 by Dr. Bobbye Morton. CXR this AM negative for pneumothorax. Water seal chest tube today and repeat CXR in AM. Pulm toilet/IS, will add flutter valve.   S/p ORIF R ankle fracture 12/07/22, Harware removal 02/01/23 - Per ortho. On abx. WBAT RLE PAF, HTN, and other chronic medical conditions - per primary    No pneumothorax on CXR this morning CT with 60 cc out last 24 hours Will discuss with my partners whether or not to remove the tube today vs tomorrow  Straight forward medical decision making    LOS: 4 days    [name] Keens MD 02/03/2023

## 2023-02-03 NOTE — Progress Notes (Signed)
Pt sounds congested w/ weak cough, inspiratory/expiratory wheezing noted. Denies SOB. Will notify MD

## 2023-02-03 NOTE — Progress Notes (Signed)
    Subjective: Patient reports pain as mild. Tolerating diet.  Urinating. Hasn't mobilized much OOB with PT/OT.  Objective:   VITALS:   Vitals:   02/02/23 2100 02/02/23 2116 02/03/23 0517 02/03/23 0718  BP:  113/62 (!) 143/72 (!) 143/71  Pulse: 63 64 63 65  Resp: 15 16 13    Temp:  98 F (36.7 C) 98.3 F (36.8 C) 97.7 F (36.5 C)  TempSrc:  Oral Oral Oral  SpO2: 96% 96% 92% 100%  Weight:      Height:          Latest Ref Rng & Units 02/03/2023    2:41 AM 02/02/2023    2:30 AM 02/01/2023    1:31 AM  CBC  WBC 4.0 - 10.5 K/uL 8.0  5.9  8.6   Hemoglobin 12.0 - 15.0 g/dL 10.5  11.0  11.5   Hematocrit 36.0 - 46.0 % 32.5  33.8  36.5   Platelets 150 - 400 K/uL 230  240  237       Latest Ref Rng & Units 02/03/2023    2:41 AM 02/02/2023    2:30 AM 02/01/2023    1:31 AM  BMP  Glucose 70 - 99 mg/dL 97  143  105   BUN 8 - 23 mg/dL 18  14  6    Creatinine 0.44 - 1.00 mg/dL 0.70  0.67  0.52   Sodium 135 - 145 mmol/L 133  130  134   Potassium 3.5 - 5.1 mmol/L 4.2  4.2  4.0   Chloride 98 - 111 mmol/L 99  98  96   CO2 22 - 32 mmol/L 28  26  28    Calcium 8.9 - 10.3 mg/dL 8.0  7.9  8.3    Intake/Output      03/23 0701 03/24 0700 03/24 0701 03/25 0700   P.O. 480    I.V. (mL/kg) 74.7 (1.2)    Total Intake(mL/kg) 554.7 (8.8)    Urine (mL/kg/hr) 1300 (0.9)    Blood     Chest Tube 60    Total Output 1360    Net -805.3            Physical Exam: General: NAD.  Sitting up in bed, eating breakfast Resp: No increased wob Cardio: regular rate and rhythm ABD soft Neurologically intact MSK Neurovascularly intact Sensation intact distally Intact pulses distally Dorsiflexion/Plantar flexion intact but decreased Moving her toes better Incision: dressing c/d/i R Chest tube in place   Assessment: 2 Days Post-Op  S/P Procedure(s) (LRB): HARDWARE REMOVAL ANKLE (Right) by Dr. Doreatha Martin on 02/01/23  Principal Problem:   Postoperative wound infection Active Problems:   Essential  hypertension   Atrial fibrillation (HCC)   Cardiac pacemaker   Pneumothorax   Plan: Doxycycline bid x 7 days  Advance diet Up with therapy Incentive Spirometry Elevate and Apply ice  Weightbearing: WBAT RLE Insicional and dressing care: Reinforce dressings as needed Orthopedic device(s): None Showering: Keep dressing dry VTE prophylaxis:  Eliquis restarted  , SCDs, ambulation Pain control: limit narcotics as able due to age Follow - up plan: 2 weeks   Dispo:  Will need SNF upon d/c when medically ready.      Britt Bottom, PA-C Office [phone] 02/03/2023, 9:11 AM

## 2023-02-03 NOTE — Progress Notes (Signed)
Progress Note   Patient: [name] [id number] DOB: [birth year] DOA: 01/30/2023     4 DOS: the patient was seen and examined on 02/03/2023   Brief hospital course: 89 y.o. female with medical history significant of hypertension, atrial fibrillation on Eliquis, COPD, arthritis, and anemia who presented for postoperative wound infection.  Patient has sustained a right ankle fracture after a fall back  and underwent fourth of both the distal femur and right bimalleolar ankle fracture by Dr. Doreatha Martin on 1/26.  She was placed in a walking cam boot postoperative which resulted in small wound forming on the lateral incision noted during postoperative follow-up.  She had been instructed to take off the cam boot off.  However, over the last 4 weeks wound on the lateral malleolus that not healed and was draining.  Despite oral antibiotics for at least 2 weeks patient had no improvement in the wound.  Plan was for her to be taken for removal of the hardware with Dr. Doreatha Martin.   Preoperative labs noted WBC 8.62, hemoglobin 11 and calcium 8.2.  Patient had been noted to have increased shortness of breath.  Chest x-ray had been obtained which revealed interval development of a small right-sided pneumothorax with similar findings of lung hyperexpansion and chronic bronchiectatic change.  Surgery had been canceled and due to these findings and trauma surgery had been consulted for need of chest tube.  Assessment and Plan: Postoperative wound infection of the right ankle Patient had ORIF of a right ankle fracture sustained after a fall by Dr. Doreatha Martin on 1/26.  Postoperatively patient had been placed in a cam boot which resulted in wound malleolus.  Since that time wound has failed to heal and started draining fluid. -Continued cefazolin -Underwent removal of hardware involving R lateral malleolus 3/22 per Ortho -Pending SNF placement on d/c   Pneumothorax Acute.  Patient was noted to have plaints of shortness of  breath.  Chest x-ray had noted a small right pneumothorax.  Trauma surgery has been consulted and placed chest tube.  -continue chest tube management per Surgery   Paroxysmal atrial fibrillation on chronic anticoagulation S/p PM Patient currently in sinus rhythm.   -Hold Eliquis for now, ultimately would resume once OK with Surgery and Ortho   Essential hypertension -Continue home blood pressure regimen as tolerated -Continue PRN hydralazine  Hyponatremia -improved with IVF -continue to encourage po intake/hydration      Subjective:Reports feeling better today  Physical Exam: Vitals:   02/02/23 2116 02/03/23 0517 02/03/23 0718 02/03/23 1453  BP: 113/62 (!) 143/72 (!) 143/71 119/72  Pulse: 64 63 65 62  Resp: 16 13    Temp: 98 F (36.7 C) 98.3 F (36.8 C) 97.7 F (36.5 C) 97.6 F (36.4 C)  TempSrc: Oral Oral Oral Oral  SpO2: 96% 92% 100% 100%  Weight:      Height:       General exam: Conversant, in no acute distress Respiratory system: normal chest rise, clear, no audible wheezing Cardiovascular system: regular rhythm, s1-s2 Gastrointestinal system: Nondistended, nontender, pos BS Central nervous system: No seizures, no tremors Extremities: No cyanosis, no joint deformities Skin: No rashes, no pallor Psychiatry: Affect normal // no auditory hallucinations   Data Reviewed:  Labs reviewed: Na 133, K 4.2, Cr 0.70, Hgb 10.5  Family Communication: Pt in room, family not at bedside  Disposition: Status is: Inpatient Remains inpatient appropriate because: Severity of illness  Planned Discharge Destination: Skilled nursing facility    Author: Marylu Lund,  MD 02/03/2023 5:15 PM  For on call review [email or web address].

## 2023-02-04 DIAGNOSIS — J939 Pneumothorax, unspecified: Secondary | ICD-10-CM | POA: Diagnosis not present

## 2023-02-04 DIAGNOSIS — T8149XA Infection following a procedure, other surgical site, initial encounter: Secondary | ICD-10-CM | POA: Diagnosis not present

## 2023-02-04 DIAGNOSIS — D62 Acute posthemorrhagic anemia: Secondary | ICD-10-CM | POA: Diagnosis not present

## 2023-02-04 MED ORDER — ACETAMINOPHEN 325 MG PO TABS: 650.0000 mg | ORAL_TABLET | Freq: Four times a day (QID) | ORAL | Status: AC | PRN

## 2023-02-04 NOTE — NC FL2 (Signed)
[place] MEDICAID FL2 LEVEL OF CARE FORM     IDENTIFICATION  Patient Name: [name] Birthdate: [birth year] Sex: female Admission Date (Current Location): 01/30/2023  [facility] and [place] Number:  Herbalist and Address:  The . [facility], [place] [address] [place], [place] [address]      Provider Number: [id number]  Attending Physician Name and Address:  Donne Hazel, MD  Relative Name and Phone Number:  Ryane, Barlow [phone]  [phone]    Current Level of Care: Hospital Recommended Level of Care: [place] Prior Approval Number:    Date Approved/Denied:   PASRR Number: [id number]  Discharge Plan: SNF    Current Diagnoses: Patient Active Problem List   Diagnosis Date Noted   Postoperative wound infection 01/30/2023   Pneumothorax 01/30/2023   COVID-19 12/07/2022   Posterior vitreous detachment of both eyes 04/27/2021   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 macular degeneration of right eye with subfoveal involvement 04/27/2020   Right epiretinal membrane 04/27/2020   Pseudophakia 04/27/2020   Intermediate 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 04/27/2020   FTT (failure to thrive) in adult    Hematoma of scalp    Wheezing    Acute blood loss anemia 11/13/2016   Syncope and collapse 11/13/2016   Fall 11/12/2016   Annual physical exam 12/20/2014   Hip fracture ([place]) 05/25/2014   H/O bilateral salpingo-oophorectomy 12/11/2013   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   Sinoatrial node dysfunction ([place])    Encounter for long-term (current) use of anticoagulants 04/30/2011   Cardiac pacemaker 04/13/2009   Essential hypertension 04/12/2009   Atrial fibrillation ([place]) 04/12/2009   COPD (chronic obstructive pulmonary disease) ([place]) 04/12/2009   Arthropathy 04/12/2009    Orientation RESPIRATION BLADDER Height & Weight     Self  Normal Continent, External catheter Weight: 139 lb 8.8 oz  (63.3 kg) Height:  5\' 5"  (165.1 cm)  BEHAVIORAL SYMPTOMS/MOOD NEUROLOGICAL BOWEL NUTRITION STATUS      Continent Diet  AMBULATORY STATUS COMMUNICATION OF NEEDS Skin   Total Care Verbally Surgical wounds, Other (Comment) (redness)                       Personal Care Assistance Level of Assistance  Bathing, Feeding, Dressing, Total care Bathing Assistance: Maximum assistance Feeding assistance: Limited assistance Dressing Assistance: Maximum assistance Total Care Assistance: Maximum assistance   Functional Limitations Info  Sight, Hearing, Speech Sight Info: Adequate Hearing Info: Adequate Speech Info: Adequate    SPECIAL CARE FACTORS FREQUENCY  PT (By licensed PT), OT (By licensed OT)     PT Frequency: 5x week OT Frequency: 5x week            Contractures Contractures Info: Not present    Additional Factors Info  Code Status, Allergies Code Status Info: DNR Allergies Info: NKA           Current Medications (02/04/2023):  This is the current hospital active medication list Current Facility-Administered Medications  Medication Dose Route Frequency Provider Last Rate Last Admin   acetaminophen (TYLENOL) tablet 650 mg  650 mg Oral Q6H PRN Corinne Ports, PA-C   650 mg at 02/04/23 [id number]   Or   acetaminophen (TYLENOL) suppository 650 mg  650 mg Rectal Q6H PRN Rushie Nyhan A, PA-C       albuterol (PROVENTIL) (2.5 MG/3ML) 0.083% nebulizer solution 2.5 mg  2.5 mg Nebulization Q4H PRN Thereasa Solo, Sarah A, PA-C       apixaban (  ELIQUIS) tablet 2.5 mg  2.5 mg Oral BID Alvira Philips, [place]   2.5 mg at 02/04/23 [id number]   cholecalciferol (VITAMIN D3) 25 MCG (1000 UNIT) tablet 1,000 Units  1,000 Units Oral Daily Corinne Ports, PA-C   1,000 Units at 02/04/23 [id number]   docusate sodium (COLACE) capsule 100 mg  100 mg Oral BID PRN Corinne Ports, PA-C       doxycycline (VIBRA-TABS) tablet 100 mg  100 mg Oral Q12H Aggie Moats M, PA-C   100 mg at 02/04/23 [id number]   guaiFENesin  tablet 200 mg  200 mg Oral Q4H PRN Meuth, Brooke A, PA-C       hydrALAZINE (APRESOLINE) injection 10 mg  10 mg Intravenous Q4H PRN Corinne Ports, PA-C   10 mg at 01/31/23 2200   lisinopril (ZESTRIL) tablet 40 mg  40 mg Oral Daily Corinne Ports, PA-C   40 mg at 02/04/23 [id number]   metoprolol tartrate (LOPRESSOR) tablet 25 mg  25 mg Oral BID Corinne Ports, PA-C   25 mg at 02/04/23 0913   ondansetron (ZOFRAN) tablet 4 mg  4 mg Oral Q6H PRN Corinne Ports, PA-C       Or   ondansetron (ZOFRAN) injection 4 mg  4 mg Intravenous Q6H PRN Thereasa Solo, Sarah A, PA-C       oxyCODONE (Oxy IR/ROXICODONE) immediate release tablet 5 mg  5 mg Oral Q6H PRN Corinne Ports, PA-C   5 mg at 02/01/23 2046   saccharomyces boulardii (FLORASTOR) capsule 250 mg  250 mg Oral BID Corinne Ports, PA-C   250 mg at 02/04/23 [id number]   sodium chloride flush (NS) 0.9 % injection 3 mL  3 mL Intravenous Q12H Corinne Ports, PA-C   3 mL at 02/04/23 [id number]     Discharge Medications: Please see discharge summary for a list of discharge medications.  Relevant Imaging Results:  Relevant Lab Results:   Additional Information [id number]  Joanne Chars, LCSW

## 2023-02-04 NOTE — Discharge Instructions (Addendum)
Orthopaedic Trauma Service Discharge Instructions   General Discharge Instructions  WEIGHT BEARING STATUS:weightbearing as tolerated  RANGE OF MOTION/ACTIVITY: ok for ankle range of motion as tolerated  Wound Care: You may remove your surgical dressing. Incisions can be left open to air if there is no drainage. Once the incision is completely dry and without drainage, it may be left open to air out.  You may begin showering once there is no drainage form the incision.  Clean incision gently with soap and water.  DVT/PE prophylaxis:  Eliquis  Diet: as you were eating previously.  Can use over the counter stool softeners and bowel preparations, such as Miralax, to help with bowel movements.  Narcotics can be constipating.  Be sure to drink plenty of fluids  PAIN MEDICATION USE AND EXPECTATIONS  You have likely been given narcotic medications to help control your pain.  After a traumatic event that results in an fracture (broken bone) with or without surgery, it is ok to use narcotic pain medications to help control one's pain.  We understand that everyone responds to pain differently and each individual patient will be evaluated on a regular basis for the continued need for narcotic medications. Ideally, narcotic medication use should last no more than 6-8 weeks (coinciding with fracture healing).   As a patient it is your responsibility as well to monitor narcotic medication use and report the amount and frequency you use these medications when you come to your office visit.   We would also advise that if you are using narcotic medications, you should take a dose prior to therapy to maximize you participation.  IF YOU ARE ON NARCOTIC MEDICATIONS IT IS NOT PERMISSIBLE TO OPERATE A MOTOR VEHICLE (MOTORCYCLE/CAR/TRUCK/MOPED) OR HEAVY MACHINERY DO NOT MIX NARCOTICS WITH OTHER CNS (CENTRAL NERVOUS SYSTEM) DEPRESSANTS SUCH AS ALCOHOL   STOP SMOKING OR USING NICOTINE PRODUCTS!!!!  As discussed  nicotine severely impairs your body's ability to heal surgical and traumatic wounds but also impairs bone healing.  Wounds and bone heal by forming microscopic blood vessels (angiogenesis) and nicotine is a vasoconstrictor (essentially, shrinks blood vessels).  Therefore, if vasoconstriction occurs to these microscopic blood vessels they essentially disappear and are unable to deliver necessary nutrients to the healing tissue.  This is one modifiable factor that you can do to dramatically increase your chances of healing your injury.    (This means no smoking, no nicotine gum, patches, etc)  DO NOT USE NONSTEROIDAL ANTI-INFLAMMATORY DRUGS (NSAID'S)  Using products such as Advil (ibuprofen), Aleve (naproxen), Motrin (ibuprofen) for additional pain control during fracture healing can delay and/or prevent the healing response.  If you would like to take over the counter (OTC) medication, Tylenol (acetaminophen) is ok.  However, some narcotic medications that are given for pain control contain acetaminophen as well. Therefore, you should not exceed more than 4000 mg of tylenol in a day if you do not have liver disease.  Also note that there are may OTC medicines, such as cold medicines and allergy medicines that my contain tylenol as well.  If you have any questions about medications and/or interactions please ask your doctor/PA or your pharmacist.      ICE AND ELEVATE INJURED/OPERATIVE EXTREMITY  Using ice and elevating the injured extremity above your heart can help with swelling and pain control.  Icing in a pulsatile fashion, such as 20 minutes on and 20 minutes off, can be followed.    Do not place ice directly on skin. Make sure  there is a barrier between to skin and the ice pack.    Using frozen items such as frozen peas works well as the conform nicely to the are that needs to be iced.  USE AN ACE WRAP OR TED HOSE FOR SWELLING CONTROL  In addition to icing and elevation, Ace wraps or TED hose are  used to help limit and resolve swelling.  It is recommended to use Ace wraps or TED hose until you are informed to stop.    When using Ace Wraps start the wrapping distally (farthest away from the body) and wrap proximally (closer to the body)   Example: If you had surgery on your leg or thing and you do not have a splint on, start the ace wrap at the toes and work your way up to the thigh        If you had surgery on your upper extremity and do not have a splint on, start the ace wrap at your fingers and work your way up to the upper arm   [place]: [phone]   VISIT OUR WEBSITE FOR ADDITIONAL INFORMATION: [email or web address]   Discharge Wound Care Instructions  Do NOT apply any ointments, solutions or lotions to pin sites or surgical wounds.  These prevent needed drainage and even though solutions like hydrogen peroxide kill bacteria, they also damage cells lining the pin sites that help fight infection.  Applying lotions or ointments can keep the wounds moist and can cause them to breakdown and open up as well. This can increase the risk for infection. When in doubt call the office.  If any drainage is noted, use one layer of adaptic or Mepitel, then gauze, Kerlix, and an ace wrap. - These dressing supplies should be available at local medical supply stores [facility], [facility], etc) as well as Management consultant ([facility], [facility], [place], etc)  Once the incision is completely dry and without drainage, it may be left open to air out.  Showering may begin 36-48 hours later.  Cleaning gently with soap and water.

## 2023-02-04 NOTE — Progress Notes (Signed)
Orthopaedic Trauma Progress Note  SUBJECTIVE: Doing well this morning. Pain controlled. Didn't remember that she had surgery on her ankle. Ortho issues stable. Feels like she is breathing normally. No other complaints  OBJECTIVE:  Vitals:   02/04/23 0714 02/04/23 0714  BP: 126/69 126/69  Pulse: (!) 57 62  Resp:    Temp: 97.7 F (36.5 C) 97.7 F (36.5 C)  SpO2: 97% 99%    General: Sitting up in bed, no acute distress Respiratory: No increased work of breathing.  Right lower extremity: Dressing over the lateral ankle changed. Incision clean, dry, intact.  Able to wiggle toes.  Ankle DF/PF intact.  Endorses sensation to light touch of the foot.  LABS:  No results found for this or any previous visit (from the past 24 hour(s)).   ASSESSMENT: [name] is a 89 y.o. female presenting for hardware removal right ankle.  Admitted for right-sided pneumothorax   CV/Blood loss: Hemoglobin 10.5 this AM, stable.   PLAN: Weightbearing: WBAT RLE ROM: Okay for unrestricted ankle motion Incisional and dressing care: Change dressing PRN Showering: Ok to shower, keep dressing dry Orthopedic device(s): None Pain management:  1. Tylenol 650 mg q 6 hours PRN 2. Oxycodone 5 mg q 6 hours PRN VTE prophylaxis:  Eliquis , SCDs ID: Doxycycline 100 mg BID x 7 days (last dose Friday PM) Foley/Lines:  No foley, KVO IVFs  Dispo: Therapies as able. D/c to SNF once cleared by trauma and medicine team. Continue home dose Eliquis For DVT ppx and Tylenol for pain control at discharge  Follow - up plan: 2 weeks after d/c for wound check and suture removal   Contact information:  Katha Hamming MD, Rushie Nyhan PA-C. After hours and holidays please check [email or web address] for group call information for [facility]   Gwinda Passe, PA-C [phone] (office) [email or web address]

## 2023-02-04 NOTE — Consult Note (Signed)
   [facility] CM Inpatient Consult   02/04/2023  Brenisha Koder Bag[place] 22-Feb-1923 [id number]  [place] [facility] [ACO] Patient: [employer] Medicare  Primary Care Provider:  Susy Frizzle, MD with [facility]   Patient was reviewed for transition of care for post hospital follow up needs. Patient is being recommended for a skilled nursing facility level of care for rehab.  15:25 pm Patient supine in bed with no acute distress noted, no family is currently at bedside.  Review of TOC team notes patient is for transition to [facility], which is a [facility] affiliated facility.  If the patient goes to a [facility] affiliated facility then, patient can be followed by [facility] PAC RN with traditional Medicare and approved Medicare Advantage plans.    Plan:   Notify [facility] RN who can follow for any known or needs for transitional care needs for returning to post facility care coordination needs to return to community.  For questions or referrals, please contact:   Natividad Brood, RN BSN [place]  [phone] business mobile phone Toll free office [phone]  *[place]  [phone] Fax number: [phone] [email or web address]

## 2023-02-04 NOTE — TOC Initial Note (Signed)
Transition of Care [facility]) - Initial/Assessment Note    Patient Details  Name: [name] MRN: [id number] Date of Birth: 01-Oct-1923  Transition of Care [facility]) CM/SW Contact:    Joanne Chars, LCSW Phone Number: 02/04/2023, 10:26 AM  Clinical Narrative:     Pt oriented x1, CSW spoke with son [name] regarding PT recommendation for SNF.  [name] has already arranged for pt to admit to [facility].  Discussed potential insurance auth for STR and he is agreeable to this.    CSW confirmed with Michelle/Ashton that they can accept pt today. Auth request submitted in [place].               Expected Discharge Plan: Skilled Nursing Facility Barriers to Discharge: SNF Pending bed offer   Patient Goals and CMS Choice     Choice offered to / list presented to : Adult Children (son [name])      Expected Discharge Plan and Services In-house Referral: Clinical Social Work   Post Acute Care Choice: [place] Living arrangements for the past 2 months: [place]                                      Prior Living Arrangements/Services Living arrangements for the past 2 months: Single Family Home Lives with:: Self Patient language and need for interpreter reviewed:: Yes        Need for Family Participation in Patient Care: Yes (Comment) Care giver support system in place?: Yes (comment) Current home services: Other (comment) (none) Criminal Activity/Legal Involvement Pertinent to Current Situation/Hospitalization: No - Comment as needed  Activities of Daily Living Home Assistive Devices/Equipment: Eyeglasses, Environmental consultant (specify type), Wheelchair ADL Screening (condition at time of admission) Patient's cognitive ability adequate to safely complete daily activities?: Yes Is the patient deaf or have difficulty hearing?: Yes Does the patient have difficulty seeing, even when wearing glasses/contacts?: No Does the patient have difficulty concentrating,  remembering, or making decisions?: Yes Patient able to express need for assistance with ADLs?: Yes Does the patient have difficulty dressing or bathing?: Yes Independently performs ADLs?: No Does the patient have difficulty walking or climbing stairs?: Yes Weakness of Legs: Both Weakness of Arms/Hands: Both  Permission Sought/Granted                  Emotional Assessment Appearance:: Appears stated age Attitude/Demeanor/Rapport: Unable to Assess Affect (typically observed): Pleasant Orientation: : Oriented to Self Alcohol / Substance Use: Not Applicable Psych Involvement: No (comment)  Admission diagnosis:  Postoperative wound infection [T81.49XA] Patient Active Problem List   Diagnosis Date Noted   Postoperative wound infection 01/30/2023   Pneumothorax 01/30/2023   COVID-19 12/07/2022   Posterior vitreous detachment of both eyes 04/27/2021   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 macular degeneration of right eye with subfoveal involvement 04/27/2020   Right epiretinal membrane 04/27/2020   Pseudophakia 04/27/2020   Intermediate 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 04/27/2020   FTT (failure to thrive) in adult    Hematoma of scalp    Wheezing    Acute blood loss anemia 11/13/2016   Syncope and collapse 11/13/2016   Fall 11/12/2016   Annual physical exam 12/20/2014   Hip fracture ([place]) 05/25/2014   H/O bilateral salpingo-oophorectomy 12/11/2013   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 [place] [address] [place] [address] Phone: [phone] Fax: [phone]     Social Determinants of Health (SDOH) Social History: SDOH Screenings   Depression (PHQ2-9): Low Risk  (09/12/2022)  Tobacco Use: Low Risk  (02/02/2023)   SDOH Interventions:     Readmission Risk Interventions     No data to display

## 2023-02-04 NOTE — Progress Notes (Signed)
Progress Note   Patient: [name] [id number] DOB: [birth year] DOA: 01/30/2023     5 DOS: the patient was seen and examined on 02/04/2023   Brief hospital course: 89 y.o. female with medical history significant of hypertension, atrial fibrillation on Eliquis, COPD, arthritis, and anemia who presented for postoperative wound infection.  Patient has sustained a right ankle fracture after a fall back  and underwent fourth of both the distal femur and right bimalleolar ankle fracture by Dr. Doreatha Martin on 1/26.  She was placed in a walking cam boot postoperative which resulted in small wound forming on the lateral incision noted during postoperative follow-up.  She had been instructed to take off the cam boot off.  However, over the last 4 weeks wound on the lateral malleolus that not healed and was draining.  Despite oral antibiotics for at least 2 weeks patient had no improvement in the wound.  Plan was for her to be taken for removal of the hardware with Dr. Doreatha Martin.   Preoperative labs noted WBC 8.62, hemoglobin 11 and calcium 8.2.  Patient had been noted to have increased shortness of breath.  Chest x-ray had been obtained which revealed interval development of a small right-sided pneumothorax with similar findings of lung hyperexpansion and chronic bronchiectatic change.  Surgery had been canceled and due to these findings and trauma surgery had been consulted for need of chest tube.  Assessment and Plan: Postoperative wound infection of the right ankle Patient had ORIF of a right ankle fracture sustained after a fall by Dr. Doreatha Martin on 1/26.  Postoperatively patient had been placed in a cam boot which resulted in wound malleolus.  Since that time wound has failed to heal and started draining fluid. -Continued cefazolin -Underwent removal of hardware involving R lateral malleolus 3/22 per Ortho, okay for DC per orthopedic surgery -Pending SNF placement on d/c   Pneumothorax Acute.  Patient was  noted to have plaints of shortness of breath.  Chest x-ray had noted a small right pneumothorax.  Trauma surgery was consulted and placed chest tube.  -continue chest tube management per Surgery -Chest tube has since been discontinued, discussed with trauma surgery.  Patient okay for discharge at this time   Paroxysmal atrial fibrillation on chronic anticoagulation S/p PM Patient currently in sinus rhythm.   -Okay to continue Eliquis per orthopedic surgery   Essential hypertension -Continue home blood pressure regimen as tolerated -Continue PRN hydralazine  Hyponatremia -improved with IVF -continue to encourage po intake/hydration      Subjective: States feeling well today.  No complaints  Physical Exam: Vitals:   02/04/23 0413 02/04/23 0714 02/04/23 0714 02/04/23 1441  BP: 103/82 126/69 126/69 (!) 166/85  Pulse: 66 (!) 57 62 66  Resp: 15     Temp: 98 F (36.7 C) 97.7 F (36.5 C) 97.7 F (36.5 C) 97.7 F (36.5 C)  TempSrc: Oral Oral Oral Oral  SpO2: 99% 97% 99% 100%  Weight:      Height:       General exam: Awake, laying in bed, in nad Respiratory system: Normal respiratory effort, no wheezing Cardiovascular system: regular rate, s1, s2 Gastrointestinal system: Soft, nondistended, positive BS Central nervous system: CN2-12 grossly intact, strength intact Extremities: Perfused, no clubbing Skin: Normal skin turgor, no notable skin lesions seen Psychiatry: Mood normal // no visual hallucinations   Data Reviewed:  There are no new results to review at this time.  Family Communication: Pt in room, family not at bedside  Disposition: Status is: Inpatient Remains inpatient appropriate because: Severity of illness  Planned Discharge Destination: Skilled nursing facility    Author: Marylu Lund, MD 02/04/2023 3:33 PM  For on call review [email or web address].

## 2023-02-04 NOTE — Care Management Important Message (Signed)
Important Message  Patient Details  Name: [name] MRN: [id number] Date of Birth: 1923-04-28   Medicare Important Message Given:  Yes     Hannah Beat 02/04/2023, 11:15 AM

## 2023-02-05 DIAGNOSIS — T8149XA Infection following a procedure, other surgical site, initial encounter: Secondary | ICD-10-CM | POA: Diagnosis not present

## 2023-02-05 DIAGNOSIS — J939 Pneumothorax, unspecified: Secondary | ICD-10-CM | POA: Diagnosis not present

## 2023-02-05 DIAGNOSIS — D62 Acute posthemorrhagic anemia: Secondary | ICD-10-CM | POA: Diagnosis not present

## 2023-02-05 MED ORDER — POLYETHYLENE GLYCOL 3350 17 G PO PACK: 17.0000 g | PACK | Freq: Every day | ORAL | 0 refills | Status: AC

## 2023-02-05 MED ORDER — DOXYCYCLINE HYCLATE 100 MG PO TABS: 100.0000 mg | ORAL_TABLET | Freq: Two times a day (BID) | ORAL | 0 refills | Status: AC

## 2023-02-05 MED ORDER — SACCHAROMYCES BOULARDII 250 MG PO CAPS: 250.0000 mg | ORAL_CAPSULE | Freq: Two times a day (BID) | ORAL | 0 refills | Status: AC

## 2023-02-05 NOTE — Discharge Summary (Signed)
Physician Discharge Summary   Patient: [name] MRN: [id number] DOB: [birth year]  Admit date:     01/30/2023  Discharge date: 02/05/23  Discharge Physician: Marylu Lund   PCP: Susy Frizzle, MD   Recommendations at discharge:    Follow up with PCP in 1-2 weeks Follow up with Dr. Doreatha Martin in 2 weeks  Discharge Diagnoses: Principal Problem:   Postoperative wound infection Active Problems:   Pneumothorax   Atrial fibrillation [facility])   Cardiac pacemaker   Essential hypertension  Resolved Problems:   * No resolved hospital problems. *  Hospital Course: 89 y.o. female with medical history significant of hypertension, atrial fibrillation on Eliquis, COPD, arthritis, and anemia who presented for postoperative wound infection.  Patient has sustained a right ankle fracture after a fall back  and underwent fourth of both the distal femur and right bimalleolar ankle fracture by Dr. Doreatha Martin on 1/26.  She was placed in a walking cam boot postoperative which resulted in small wound forming on the lateral incision noted during postoperative follow-up.  She had been instructed to take off the cam boot off.  However, over the last 4 weeks wound on the lateral malleolus that not healed and was draining.  Despite oral antibiotics for at least 2 weeks patient had no improvement in the wound.  Plan was for her to be taken for removal of the hardware with Dr. Doreatha Martin.   Preoperative labs noted WBC 8.62, hemoglobin 11 and calcium 8.2.  Patient had been noted to have increased shortness of breath.  Chest x-ray had been obtained which revealed interval development of a small right-sided pneumothorax with similar findings of lung hyperexpansion and chronic bronchiectatic change.  Surgery had been canceled and due to these findings and trauma surgery had been consulted for need of chest tube.  Assessment and Plan: Postoperative wound infection of the right ankle Patient had ORIF of a right ankle fracture  sustained after a fall by Dr. Doreatha Martin on 1/26.  Postoperatively patient had been placed in a cam boot which resulted in wound malleolus.  Since that time wound has failed to heal and started draining fluid. -Pt was continued initially on cefazolin, later change to doxycycline to complete on 02/08/23  -Underwent removal of hardware involving R lateral malleolus 3/22 per Ortho, okay for DC per orthopedic surgery -to d/c to SNF   Pneumothorax Acute.  Patient was noted to have plaints of shortness of breath.  Chest x-ray had noted a small right pneumothorax.  Trauma surgery was consulted and placed chest tube.  -continue chest tube management per Surgery -Chest tube has since been discontinued, discussed with trauma surgery.  Patient okay for discharge at this time   Paroxysmal atrial fibrillation on chronic anticoagulation S/p PM Patient currently in sinus rhythm.   -Okay to continue Eliquis per orthopedic surgery   Essential hypertension -Continue home blood pressure regimen as tolerated   Hyponatremia -improved with IVF -continue to encourage po intake/hydration        Consultants: Orthopedic Surgery, Trauma Surgery Procedures performed: Chest tube placement,  hardware removal of R ankle Disposition: Skilled nursing facility Diet recommendation:  Regular diet DISCHARGE MEDICATION: Allergies as of 02/05/2023   No Known Allergies      Medication List     TAKE these medications    acetaminophen 325 MG tablet Commonly known as: TYLENOL Take 2 tablets (650 mg total) by mouth every 6 (six) hours as needed for mild pain or moderate pain (or Fever >/= 101).  apixaban 2.5 MG Tabs tablet Commonly known as: ELIQUIS Take 1 tablet (2.5 mg total) by mouth 2 (two) times daily. Dose decrease per MD.   docusate sodium 100 MG capsule Commonly known as: COLACE Take 1 capsule (100 mg total) by mouth 2 (two) times daily.   doxycycline 100 MG tablet Commonly known as: VIBRA-TABS Take 1  tablet (100 mg total) by mouth every 12 (twelve) hours for 4 days.   furosemide 40 MG tablet Commonly known as: LASIX Take 1 tablet (40 mg total) by mouth daily as needed. What changed: reasons to take this   Iron (Ferrous Sulfate) 325 (65 Fe) MG Tabs Take 325 mg by mouth daily.   lisinopril 40 MG tablet Commonly known as: ZESTRIL TAKE 1 TABLET BY MOUTH EVERY DAY   metoprolol tartrate 25 MG tablet Commonly known as: LOPRESSOR TAKE 1 TABLET BY MOUTH TWICE A DAY   multivitamin tablet Take 1 tablet by mouth daily.   polyethylene glycol 17 g packet Commonly known as: MiraLax Take 17 g by mouth daily.   saccharomyces boulardii 250 MG capsule Commonly known as: FLORASTOR Take 1 capsule (250 mg total) by mouth 2 (two) times daily.   vitamin D3 25 MCG (1000 UT) tablet Generic drug: Cholecalciferol Take 1 tablet (1,000 Units total) by mouth daily.        Follow-up Information     Haddix, Thomasene Lot, MD. Schedule an appointment as soon as possible for a visit in 2 week(s).   Specialty: Orthopedic Surgery Why: for wound check and repeat x-rays Contact information: [place] [address] [phone]         [place]. Call.   Why: You need to have a repeat chest xray about 1 week after discharge. Our office will call you with the results. Contact information: [place] [phone]        Susy Frizzle, MD Follow up in 1 week(s).   Specialty: Family Medicine Why: Hospital follow up Contact information: [place] [address] [place] [address] [phone]                Discharge Exam: Danley Danker Weights   01/29/23 1056 01/30/23 0647 02/01/23 1033  Weight: 63.3 kg 63.3 kg 63.3 kg   General exam: Awake, laying in bed, in nad Respiratory system: Normal respiratory effort, no wheezing Cardiovascular system: regular rate, s1, s2 Gastrointestinal system: Soft,  nondistended, positive BS Central nervous system: CN2-12 grossly intact, strength intact Extremities: Perfused, no clubbing Skin: Normal skin turgor, no notable skin lesions seen Psychiatry: Mood normal // no visual hallucinations   Condition at discharge: fair  The results of significant diagnostics from this hospitalization (including imaging, microbiology, ancillary and laboratory) are listed below for reference.   Imaging Studies: DG CHEST PORT 1 VIEW  Result Date: 02/03/2023 CLINICAL DATA:  Status post chest tube EXAM: PORTABLE CHEST 1 VIEW COMPARISON:  02/03/2023 FINDINGS: Cardiac shadow is enlarged but stable. Pacing device is again seen. Right-sided pigtail catheter has been removed in the interval. No new pneumothorax is identified. No focal infiltrate is seen. No bony abnormality is noted. IMPRESSION: No pneumothorax following chest tube removal. Electronically Signed   By: Inez Catalina M.D.   On: 02/03/2023 20:01   DG CHEST PORT 1 VIEW  Result Date: 02/03/2023 CLINICAL DATA:  [id number] Chest tube in place [id number] EXAM: PORTABLE CHEST - 1 VIEW COMPARISON:  the previous day's study FINDINGS:  Low lung volumes. Stable right chest tube with no definite pneumothorax although the apex is obscured overlying mandible. Coarse pulmonary parenchymal markings as before. Stable left subclavian dual lead pacemaker. Heart size and mediastinal contours are within normal limits. Aortic Atherosclerosis (ICD10-170.0). No effusion. Lumbar fixation hardware partially visualized. Bilateral shoulder DJD. IMPRESSION: Stable right chest tube. No acute findings. Electronically Signed   By: Lucrezia Europe M.D.   On: 02/03/2023 08:52   DG CHEST PORT 1 VIEW  Result Date: 02/02/2023 CLINICAL DATA:  [id number] with pneumothorax. EXAM: PORTABLE CHEST 1 VIEW COMPARISON:  Portable chest yesterday at 5:56 a.m. FINDINGS: 6:52 a.m. no right mid chest pigtail tube thoracostomy remains unchanged in position. There is no visible  pneumothorax. There is near resolution of bilateral interstitial infiltrates compared with yesterday's exam which could be due to clearing edema and/or pneumonia. There are minimal pleural effusions, unchanged. Stable cardiomegaly and pacemaker. Stable mediastinum with aortic atherosclerosis and tortuosity. Central vessels are normal caliber. Thoracic cage is intact with osteopenia. IMPRESSION: 1. No visible pneumothorax. 2. Near resolution of bilateral interstitial infiltrates compared with yesterday's exam which could be clearing edema and/or pneumonia. 3. Stable cardiomegaly and aortic atherosclerosis. 4. Normal caliber central vessels compared to yesterday's film. Electronically Signed   By: Telford Nab M.D.   On: 02/02/2023 07:56   DG Ankle Complete Right  Result Date: 02/01/2023 CLINICAL DATA:  Fluoro guidance provided EXAM: RIGHT ANKLE - COMPLETE 3+ VIEW; DG C-ARM 1-60 MIN-NO REPORT FINDINGS: Dose: 0.1 mGy Fluoro time 4s IMPRESSION: C-arm fluoro guidance provided. Electronically Signed   By: Sammie Bench M.D.   On: 02/01/2023 15:47   DG C-Arm 1-60 Min-No Report  Result Date: 02/01/2023 CLINICAL DATA:  Fluoro guidance provided EXAM: RIGHT ANKLE - COMPLETE 3+ VIEW; DG C-ARM 1-60 MIN-NO REPORT FINDINGS: Dose: 0.1 mGy Fluoro time 4s IMPRESSION: C-arm fluoro guidance provided. Electronically Signed   By: Sammie Bench M.D.   On: 02/01/2023 15:47   DG CHEST PORT 1 VIEW  Result Date: 02/01/2023 CLINICAL DATA:  Pneumothorax EXAM: PORTABLE CHEST 1 VIEW COMPARISON:  Portable exam 0556 hours compared to 01/31/2023 FINDINGS: Pigtail RIGHT thoracostomy tube unchanged. LEFT subclavian sequential transvenous pacemaker leads project at RIGHT atrium and RIGHT ventricle. Enlargement of cardiac silhouette with pulmonary vascular congestion. Atherosclerotic calcification aorta. Scattered interstitial infiltrates greatest at LEFT base. Trace RIGHT apex pneumothorax. No acute osseous findings. IMPRESSION:  Patchy BILATERAL pulmonary infiltrates. Trace RIGHT apex pneumothorax. Electronically Signed   By: Lavonia [name] M.D.   On: 02/01/2023 09:08   DG CHEST PORT 1 VIEW  Result Date: 01/31/2023 CLINICAL DATA:  Respiratory failure. EXAM: PORTABLE CHEST 1 VIEW COMPARISON:  January 30, 2023. FINDINGS: Stable cardiomediastinal silhouette. Left-sided pacemaker is unchanged. Stable position of right-sided chest tube. No pneumothorax is noted. Mild left basilar subsegmental atelectasis or infiltrate is noted. Bony thorax is unremarkable. IMPRESSION: Stable position of right-sided chest tube. Mild left basilar subsegmental atelectasis or infiltrate. Electronically Signed   By: Marijo Conception M.D.   On: 01/31/2023 08:30   DG Chest Port 1 View  Result Date: 01/30/2023 CLINICAL DATA:  Pneumothorax, post chest tube placement. EXAM: PORTABLE CHEST 1 VIEW COMPARISON:  Earlier same day FINDINGS: Suspected repositioning of right-sided chest tube, now with resolution of previously noted small residual right-sided pneumothorax. Note is made of a minimal amount of right lateral chest wall subcutaneous emphysema. Unchanged cardiac silhouette and mediastinal contours. Left anterior chest wall dual lead pacemaker, unchanged. Improved aeration the right lung with minimal residual  right basilar opacities. No new focal airspace opacities. No evidence of edema. No acute osseous abnormalities. Advanced degenerative change the bilateral glenohumeral joints, incompletely evaluated. Post lower lumbar paraspinal fusion, incompletely evaluated. Postcholecystectomy. IMPRESSION: Suspected repositioning of right-sided chest tube, now with resolution of previously noted small right-sided pneumothorax and improved aeration of the right lung. Electronically Signed   By: Sandi Mariscal M.D.   On: 01/30/2023 15:43   DG CHEST PORT 1 VIEW  Result Date: 01/30/2023 CLINICAL DATA:  Post right sided chest tube placement. EXAM: PORTABLE CHEST 1 VIEW COMPARISON:   Earlier same day FINDINGS: Interval placement right-sided chest tube with no significant change in size of small right-sided pneumothorax. Suspected trace right-sided pleural effusion, unchanged. Unchanged cardiac silhouette and mediastinal contours post left anterior chest wall dual lead pacemaker. Minimal left basilar opacities favored to represent atelectasis. No evidence of edema. No definite acute osseous abnormalities. Degenerative change the right glenohumeral joint is suspected though incompletely evaluated. Postoperative change of the lower lumbar spine. Postcholecystectomy. IMPRESSION: Interval placement of right-sided chest tube with no significant change in size of small right-sided pneumothorax. Electronically Signed   By: Sandi Mariscal M.D.   On: 01/30/2023 09:58   DG CHEST PORT 1 VIEW  Result Date: 01/30/2023 CLINICAL DATA:  Shortness of breath. Cough. Preoperative examination (hardware removal) EXAM: PORTABLE CHEST 1 VIEW COMPARISON:  12/06/2022 FINDINGS: Unchanged borderline enlarged cardiac silhouette and mediastinal contours. Stable positioning of support apparatus. Interval development of a small right-sided pneumothorax. No definite mediastinal shift. The lungs appear hyperexpanded with mild diffuse slightly nodular thickening of the pulmonary interstitium. Potential trace right-sided pleural effusion. No evidence of edema. No acute osseous abnormalities. Postoperative change of the lumbar spine, incompletely evaluated. Severe degenerative change of the bilateral glenohumeral joints. Postcholecystectomy. IMPRESSION: 1. Interval development of a small right-sided pneumothorax. 2. Similar findings of lung hyperexpansion and chronic bronchitic change. Critical Value/emergent results were called by telephone at the time of interpretation on 01/30/2023 at 8:16 am to provider [facility] , who verbally acknowledged these results. Electronically Signed   By: Sandi Mariscal M.D.   On: 01/30/2023 08:18     Microbiology: Results for orders placed or performed during the hospital encounter of 01/30/23  Surgical PCR screen     Status: None   Collection Time: 01/30/23  3:52 PM   Specimen: Nasal Mucosa; Nasal Swab  Result Value Ref Range Status   MRSA, PCR NEGATIVE NEGATIVE Final   Staphylococcus aureus NEGATIVE NEGATIVE Final    Comment: (NOTE) The Xpert SA Assay (FDA approved for NASAL specimens in patients 44 years of age and older), is one component of a comprehensive surveillance program. It is not intended to diagnose infection nor to guide or monitor treatment. Performed at [facility], [place] [address], [place], [place] [address]   Surgical pcr screen     Status: None   Collection Time: 01/31/23 10:19 PM  Result Value Ref Range Status   MRSA, PCR NEGATIVE NEGATIVE Final   Staphylococcus aureus NEGATIVE NEGATIVE Final    Comment: (NOTE) The Xpert SA Assay (FDA approved for NASAL specimens in patients 34 years of age and older), is one component of a comprehensive surveillance program. It is not intended to diagnose infection nor to guide or monitor treatment. Performed at [facility], [place] [address] [place],  [address]     Labs: CBC: Recent Labs  Lab 01/30/23 0740 02/01/23 0131 02/02/23 0230 02/03/23 0241  WBC 8.2 8.6 5.9 8.0  HGB 11.0* 11.5* 11.0* 10.5*  HCT 36.1 36.5 33.8* 32.5*  MCV 106.8* 102.5* 100.3* 101.9*  PLT 210 237 240 [id number]   Basic Metabolic Panel: Recent Labs  Lab 01/30/23 0740 02/01/23 0131 02/02/23 0230 02/03/23 0241  NA 135 134* 130* 133*  K 3.8 4.0 4.2 4.2  CL 97* 96* 98 99  CO2 31 28 26 28   GLUCOSE 117* 105* 143* 97  BUN 14 6* 14 18  CREATININE 0.51 0.52 0.67 0.70  CALCIUM 8.2* 8.3* 7.9* 8.0*   Liver Function Tests: Recent Labs  Lab 02/01/23 0131 02/02/23 0230 02/03/23 0241  AST 18 18 20   ALT 5 <5 <5  ALKPHOS 83 76 74  BILITOT 0.6 0.6 0.7  PROT 5.6* 5.5* 5.4*  ALBUMIN 2.1* 2.0* 2.0*   CBG: No results  for input(s): "GLUCAP" in the last 168 hours.  Discharge time spent: less than 30 minutes.  Signed: Marylu Lund, MD [facility] 02/05/2023

## 2023-02-05 NOTE — TOC Progression Note (Addendum)
Transition of Care [facility]) - Progression Note    Patient Details  Name: [name] MRN: [id number] Date of Birth: Jul 29, 1923  Transition of Care [facility]) CM/SW Contact  Joanne Chars, LCSW Phone Number: 02/05/2023, 9:48 AM  Clinical Narrative:   [id number], 3 days: 3/25-3/27.  MD informed. CSW confirmed with [facility] that they can accept pt today.  1115: pt arrived in wheelchair, son reports this is Patent examiner wheelchair.  CSW spoke with Loree Fee and informed her that we have it, RN will place it in conference room, Loree Fee will call someone to come get it.  Expected Discharge Plan: Skilled Nursing Facility Barriers to Discharge: SNF Pending bed offer  Expected Discharge Plan and Services In-house Referral: Clinical Social Work   Post Acute Care Choice: [place] Living arrangements for the past 2 months: Single Family Home                                       Social Determinants of Health (SDOH) Interventions SDOH Screenings   Depression (PHQ2-9): Low Risk  (09/12/2022)  Tobacco Use: Low Risk  (02/02/2023)    Readmission Risk Interventions     No data to display

## 2023-02-05 NOTE — TOC Transition Note (Signed)
Transition of Care [facility]) - CM/SW Discharge Note   Patient Details  Name: [name] MRN: [id number] Date of Birth: [birth year]  Transition [facility]) CM/SW Contact:  Joanne Chars, LCSW Phone Number: 02/05/2023, 11:14 AM   Clinical Narrative:   Pt discharging to [facility].  RN call report to [phone].    Final next level of care: Skilled Nursing Facility Barriers to Discharge: Barriers Resolved   Patient Goals and CMS Choice   Choice offered to / list presented to : Adult Children (son [name])  Discharge Placement                [facility] at:  [name]) Patient to be transferred to facility by: [place] Name of family member notified: son [name] Patient and family notified of of transfer: 02/05/23  Discharge Plan and Services Additional resources added to the After Visit Summary for   In-house Referral: Clinical Social Work   Post Acute Care Choice: [place]                               Social Determinants of Health (SDOH) Interventions SDOH Screenings   Depression (PHQ2-9): Low Risk  (09/12/2022)  Tobacco Use: Low Risk  (02/02/2023)     Readmission Risk Interventions     No data to display

## 2023-02-11 DIAGNOSIS — I480 Paroxysmal atrial fibrillation: Secondary | ICD-10-CM | POA: Diagnosis not present

## 2023-02-11 DIAGNOSIS — M6281 Muscle weakness (generalized): Secondary | ICD-10-CM | POA: Diagnosis not present

## 2023-02-11 DIAGNOSIS — I4891 Unspecified atrial fibrillation: Secondary | ICD-10-CM | POA: Diagnosis not present

## 2023-02-11 DIAGNOSIS — T8142XA Infection following a procedure, deep incisional surgical site, initial encounter: Secondary | ICD-10-CM | POA: Diagnosis not present

## 2023-02-11 DIAGNOSIS — S93431D Sprain of tibiofibular ligament of right ankle, subsequent encounter: Secondary | ICD-10-CM | POA: Diagnosis not present

## 2023-02-11 DIAGNOSIS — S82841D Displaced bimalleolar fracture of right lower leg, subsequent encounter for closed fracture with routine healing: Secondary | ICD-10-CM | POA: Diagnosis not present

## 2023-02-11 DIAGNOSIS — R1312 Dysphagia, oropharyngeal phase: Secondary | ICD-10-CM | POA: Diagnosis not present

## 2023-02-11 DIAGNOSIS — J449 Chronic obstructive pulmonary disease, unspecified: Secondary | ICD-10-CM | POA: Diagnosis not present

## 2023-02-11 DIAGNOSIS — R278 Other lack of coordination: Secondary | ICD-10-CM | POA: Diagnosis not present

## 2023-02-11 DIAGNOSIS — I10 Essential (primary) hypertension: Secondary | ICD-10-CM | POA: Diagnosis not present

## 2023-02-11 DIAGNOSIS — Z9181 History of falling: Secondary | ICD-10-CM | POA: Diagnosis not present

## 2023-02-11 DIAGNOSIS — S72451D Displaced supracondylar fracture without intracondylar extension of lower end of right femur, subsequent encounter for closed fracture with routine healing: Secondary | ICD-10-CM | POA: Diagnosis not present

## 2023-02-11 DIAGNOSIS — R2689 Other abnormalities of gait and mobility: Secondary | ICD-10-CM | POA: Diagnosis not present

## 2023-02-11 DIAGNOSIS — T8189XA Other complications of procedures, not elsewhere classified, initial encounter: Secondary | ICD-10-CM | POA: Diagnosis not present

## 2023-02-11 DIAGNOSIS — T8149XD Infection following a procedure, other surgical site, subsequent encounter: Secondary | ICD-10-CM | POA: Diagnosis not present

## 2023-02-11 DIAGNOSIS — J939 Pneumothorax, unspecified: Secondary | ICD-10-CM | POA: Diagnosis not present

## 2023-02-12 DIAGNOSIS — I4891 Unspecified atrial fibrillation: Secondary | ICD-10-CM | POA: Diagnosis not present

## 2023-02-12 DIAGNOSIS — Z9181 History of falling: Secondary | ICD-10-CM | POA: Diagnosis not present

## 2023-02-12 DIAGNOSIS — J939 Pneumothorax, unspecified: Secondary | ICD-10-CM | POA: Diagnosis not present

## 2023-02-12 DIAGNOSIS — J449 Chronic obstructive pulmonary disease, unspecified: Secondary | ICD-10-CM | POA: Diagnosis not present

## 2023-02-12 DIAGNOSIS — T8142XA Infection following a procedure, deep incisional surgical site, initial encounter: Secondary | ICD-10-CM | POA: Diagnosis not present

## 2023-02-12 DIAGNOSIS — R278 Other lack of coordination: Secondary | ICD-10-CM | POA: Diagnosis not present

## 2023-02-12 DIAGNOSIS — R2689 Other abnormalities of gait and mobility: Secondary | ICD-10-CM | POA: Diagnosis not present

## 2023-02-12 DIAGNOSIS — T8149XD Infection following a procedure, other surgical site, subsequent encounter: Secondary | ICD-10-CM | POA: Diagnosis not present

## 2023-02-12 DIAGNOSIS — M6281 Muscle weakness (generalized): Secondary | ICD-10-CM | POA: Diagnosis not present

## 2023-02-12 DIAGNOSIS — T8189XA Other complications of procedures, not elsewhere classified, initial encounter: Secondary | ICD-10-CM | POA: Diagnosis not present

## 2023-02-12 DIAGNOSIS — I480 Paroxysmal atrial fibrillation: Secondary | ICD-10-CM | POA: Diagnosis not present

## 2023-02-12 DIAGNOSIS — I10 Essential (primary) hypertension: Secondary | ICD-10-CM | POA: Diagnosis not present

## 2023-02-13 DIAGNOSIS — I480 Paroxysmal atrial fibrillation: Secondary | ICD-10-CM | POA: Diagnosis not present

## 2023-02-13 DIAGNOSIS — J449 Chronic obstructive pulmonary disease, unspecified: Secondary | ICD-10-CM | POA: Diagnosis not present

## 2023-02-13 DIAGNOSIS — J939 Pneumothorax, unspecified: Secondary | ICD-10-CM | POA: Diagnosis not present

## 2023-02-13 DIAGNOSIS — I10 Essential (primary) hypertension: Secondary | ICD-10-CM | POA: Diagnosis not present

## 2023-02-13 DIAGNOSIS — T8142XA Infection following a procedure, deep incisional surgical site, initial encounter: Secondary | ICD-10-CM | POA: Diagnosis not present

## 2023-02-13 DIAGNOSIS — M6281 Muscle weakness (generalized): Secondary | ICD-10-CM | POA: Diagnosis not present

## 2023-02-15 DIAGNOSIS — Z9181 History of falling: Secondary | ICD-10-CM | POA: Diagnosis not present

## 2023-02-15 DIAGNOSIS — M6281 Muscle weakness (generalized): Secondary | ICD-10-CM | POA: Diagnosis not present

## 2023-02-15 DIAGNOSIS — I10 Essential (primary) hypertension: Secondary | ICD-10-CM | POA: Diagnosis not present

## 2023-02-15 DIAGNOSIS — R2689 Other abnormalities of gait and mobility: Secondary | ICD-10-CM | POA: Diagnosis not present

## 2023-02-15 DIAGNOSIS — I4891 Unspecified atrial fibrillation: Secondary | ICD-10-CM | POA: Diagnosis not present

## 2023-02-15 DIAGNOSIS — R278 Other lack of coordination: Secondary | ICD-10-CM | POA: Diagnosis not present

## 2023-02-15 DIAGNOSIS — T8149XD Infection following a procedure, other surgical site, subsequent encounter: Secondary | ICD-10-CM | POA: Diagnosis not present

## 2023-02-15 DIAGNOSIS — I480 Paroxysmal atrial fibrillation: Secondary | ICD-10-CM | POA: Diagnosis not present

## 2023-02-15 DIAGNOSIS — J939 Pneumothorax, unspecified: Secondary | ICD-10-CM | POA: Diagnosis not present

## 2023-02-15 DIAGNOSIS — T8142XA Infection following a procedure, deep incisional surgical site, initial encounter: Secondary | ICD-10-CM | POA: Diagnosis not present

## 2023-02-15 DIAGNOSIS — J449 Chronic obstructive pulmonary disease, unspecified: Secondary | ICD-10-CM | POA: Diagnosis not present

## 2023-02-18 DIAGNOSIS — J449 Chronic obstructive pulmonary disease, unspecified: Secondary | ICD-10-CM | POA: Diagnosis not present

## 2023-02-18 DIAGNOSIS — J939 Pneumothorax, unspecified: Secondary | ICD-10-CM | POA: Diagnosis not present

## 2023-02-18 DIAGNOSIS — I10 Essential (primary) hypertension: Secondary | ICD-10-CM | POA: Diagnosis not present

## 2023-02-18 DIAGNOSIS — M6281 Muscle weakness (generalized): Secondary | ICD-10-CM | POA: Diagnosis not present

## 2023-02-18 DIAGNOSIS — T8142XA Infection following a procedure, deep incisional surgical site, initial encounter: Secondary | ICD-10-CM | POA: Diagnosis not present

## 2023-02-18 DIAGNOSIS — I480 Paroxysmal atrial fibrillation: Secondary | ICD-10-CM | POA: Diagnosis not present

## 2023-02-19 DIAGNOSIS — T8149XD Infection following a procedure, other surgical site, subsequent encounter: Secondary | ICD-10-CM | POA: Diagnosis not present

## 2023-02-19 DIAGNOSIS — Z9181 History of falling: Secondary | ICD-10-CM | POA: Diagnosis not present

## 2023-02-19 DIAGNOSIS — R2689 Other abnormalities of gait and mobility: Secondary | ICD-10-CM | POA: Diagnosis not present

## 2023-02-19 DIAGNOSIS — I4891 Unspecified atrial fibrillation: Secondary | ICD-10-CM | POA: Diagnosis not present

## 2023-02-19 DIAGNOSIS — R278 Other lack of coordination: Secondary | ICD-10-CM | POA: Diagnosis not present

## 2023-02-19 DIAGNOSIS — S82841D Displaced bimalleolar fracture of right lower leg, subsequent encounter for closed fracture with routine healing: Secondary | ICD-10-CM | POA: Diagnosis not present

## 2023-02-19 DIAGNOSIS — M6281 Muscle weakness (generalized): Secondary | ICD-10-CM | POA: Diagnosis not present

## 2023-02-20 DIAGNOSIS — I10 Essential (primary) hypertension: Secondary | ICD-10-CM | POA: Diagnosis not present

## 2023-02-22 DIAGNOSIS — I4891 Unspecified atrial fibrillation: Secondary | ICD-10-CM | POA: Diagnosis not present

## 2023-02-22 DIAGNOSIS — R2689 Other abnormalities of gait and mobility: Secondary | ICD-10-CM | POA: Diagnosis not present

## 2023-02-22 DIAGNOSIS — Z9181 History of falling: Secondary | ICD-10-CM | POA: Diagnosis not present

## 2023-02-22 DIAGNOSIS — T8149XD Infection following a procedure, other surgical site, subsequent encounter: Secondary | ICD-10-CM | POA: Diagnosis not present

## 2023-02-22 DIAGNOSIS — R278 Other lack of coordination: Secondary | ICD-10-CM | POA: Diagnosis not present

## 2023-02-22 DIAGNOSIS — M6281 Muscle weakness (generalized): Secondary | ICD-10-CM | POA: Diagnosis not present

## 2023-02-27 DIAGNOSIS — T8142XA Infection following a procedure, deep incisional surgical site, initial encounter: Secondary | ICD-10-CM | POA: Diagnosis not present

## 2023-02-27 DIAGNOSIS — R2689 Other abnormalities of gait and mobility: Secondary | ICD-10-CM | POA: Diagnosis not present

## 2023-02-27 DIAGNOSIS — I480 Paroxysmal atrial fibrillation: Secondary | ICD-10-CM | POA: Diagnosis not present

## 2023-02-27 DIAGNOSIS — R278 Other lack of coordination: Secondary | ICD-10-CM | POA: Diagnosis not present

## 2023-02-27 DIAGNOSIS — J449 Chronic obstructive pulmonary disease, unspecified: Secondary | ICD-10-CM | POA: Diagnosis not present

## 2023-02-27 DIAGNOSIS — T8149XD Infection following a procedure, other surgical site, subsequent encounter: Secondary | ICD-10-CM | POA: Diagnosis not present

## 2023-02-27 DIAGNOSIS — M6281 Muscle weakness (generalized): Secondary | ICD-10-CM | POA: Diagnosis not present

## 2023-02-27 DIAGNOSIS — Z9181 History of falling: Secondary | ICD-10-CM | POA: Diagnosis not present

## 2023-02-27 DIAGNOSIS — I4891 Unspecified atrial fibrillation: Secondary | ICD-10-CM | POA: Diagnosis not present

## 2023-02-27 DIAGNOSIS — I10 Essential (primary) hypertension: Secondary | ICD-10-CM | POA: Diagnosis not present

## 2023-02-27 DIAGNOSIS — J939 Pneumothorax, unspecified: Secondary | ICD-10-CM | POA: Diagnosis not present

## 2023-03-05 DIAGNOSIS — M6281 Muscle weakness (generalized): Secondary | ICD-10-CM | POA: Diagnosis not present

## 2023-03-05 DIAGNOSIS — R2689 Other abnormalities of gait and mobility: Secondary | ICD-10-CM | POA: Diagnosis not present

## 2023-03-05 DIAGNOSIS — T8149XD Infection following a procedure, other surgical site, subsequent encounter: Secondary | ICD-10-CM | POA: Diagnosis not present

## 2023-03-05 DIAGNOSIS — I4891 Unspecified atrial fibrillation: Secondary | ICD-10-CM | POA: Diagnosis not present

## 2023-03-05 DIAGNOSIS — Z9181 History of falling: Secondary | ICD-10-CM | POA: Diagnosis not present

## 2023-03-05 DIAGNOSIS — R278 Other lack of coordination: Secondary | ICD-10-CM | POA: Diagnosis not present

## 2023-03-08 DIAGNOSIS — R278 Other lack of coordination: Secondary | ICD-10-CM | POA: Diagnosis not present

## 2023-03-08 DIAGNOSIS — Z9181 History of falling: Secondary | ICD-10-CM | POA: Diagnosis not present

## 2023-03-08 DIAGNOSIS — T8149XD Infection following a procedure, other surgical site, subsequent encounter: Secondary | ICD-10-CM | POA: Diagnosis not present

## 2023-03-08 DIAGNOSIS — R2689 Other abnormalities of gait and mobility: Secondary | ICD-10-CM | POA: Diagnosis not present

## 2023-03-08 DIAGNOSIS — I4891 Unspecified atrial fibrillation: Secondary | ICD-10-CM | POA: Diagnosis not present

## 2023-03-08 DIAGNOSIS — M6281 Muscle weakness (generalized): Secondary | ICD-10-CM | POA: Diagnosis not present

## 2023-03-11 DIAGNOSIS — M6281 Muscle weakness (generalized): Secondary | ICD-10-CM | POA: Diagnosis not present

## 2023-03-11 DIAGNOSIS — T8142XA Infection following a procedure, deep incisional surgical site, initial encounter: Secondary | ICD-10-CM | POA: Diagnosis not present

## 2023-03-11 DIAGNOSIS — I10 Essential (primary) hypertension: Secondary | ICD-10-CM | POA: Diagnosis not present

## 2023-03-11 DIAGNOSIS — J939 Pneumothorax, unspecified: Secondary | ICD-10-CM | POA: Diagnosis not present

## 2023-03-11 DIAGNOSIS — I480 Paroxysmal atrial fibrillation: Secondary | ICD-10-CM | POA: Diagnosis not present

## 2023-03-11 DIAGNOSIS — J449 Chronic obstructive pulmonary disease, unspecified: Secondary | ICD-10-CM | POA: Diagnosis not present

## 2023-03-13 DIAGNOSIS — Z9181 History of falling: Secondary | ICD-10-CM | POA: Diagnosis not present

## 2023-03-13 DIAGNOSIS — I4891 Unspecified atrial fibrillation: Secondary | ICD-10-CM | POA: Diagnosis not present

## 2023-03-13 DIAGNOSIS — M6281 Muscle weakness (generalized): Secondary | ICD-10-CM | POA: Diagnosis not present

## 2023-03-13 DIAGNOSIS — R278 Other lack of coordination: Secondary | ICD-10-CM | POA: Diagnosis not present

## 2023-03-13 DIAGNOSIS — T8149XD Infection following a procedure, other surgical site, subsequent encounter: Secondary | ICD-10-CM | POA: Diagnosis not present

## 2023-03-13 DIAGNOSIS — R2689 Other abnormalities of gait and mobility: Secondary | ICD-10-CM | POA: Diagnosis not present

## 2023-03-19 DIAGNOSIS — S82841D Displaced bimalleolar fracture of right lower leg, subsequent encounter for closed fracture with routine healing: Secondary | ICD-10-CM | POA: Diagnosis not present

## 2023-03-19 DIAGNOSIS — Z9181 History of falling: Secondary | ICD-10-CM | POA: Diagnosis not present

## 2023-03-19 DIAGNOSIS — S93431D Sprain of tibiofibular ligament of right ankle, subsequent encounter: Secondary | ICD-10-CM | POA: Diagnosis not present

## 2023-03-19 DIAGNOSIS — I10 Essential (primary) hypertension: Secondary | ICD-10-CM | POA: Diagnosis not present

## 2023-03-19 DIAGNOSIS — S72451D Displaced supracondylar fracture without intracondylar extension of lower end of right femur, subsequent encounter for closed fracture with routine healing: Secondary | ICD-10-CM | POA: Diagnosis not present

## 2023-03-19 DIAGNOSIS — I4891 Unspecified atrial fibrillation: Secondary | ICD-10-CM | POA: Diagnosis not present

## 2023-03-19 DIAGNOSIS — M6281 Muscle weakness (generalized): Secondary | ICD-10-CM | POA: Diagnosis not present

## 2023-03-19 DIAGNOSIS — J449 Chronic obstructive pulmonary disease, unspecified: Secondary | ICD-10-CM | POA: Diagnosis not present

## 2023-03-19 DIAGNOSIS — R2689 Other abnormalities of gait and mobility: Secondary | ICD-10-CM | POA: Diagnosis not present

## 2023-03-19 DIAGNOSIS — R278 Other lack of coordination: Secondary | ICD-10-CM | POA: Diagnosis not present

## 2023-03-19 DIAGNOSIS — T8149XD Infection following a procedure, other surgical site, subsequent encounter: Secondary | ICD-10-CM | POA: Diagnosis not present

## 2023-03-22 DIAGNOSIS — M6281 Muscle weakness (generalized): Secondary | ICD-10-CM | POA: Diagnosis not present

## 2023-03-22 DIAGNOSIS — T8149XD Infection following a procedure, other surgical site, subsequent encounter: Secondary | ICD-10-CM | POA: Diagnosis not present

## 2023-03-22 DIAGNOSIS — R278 Other lack of coordination: Secondary | ICD-10-CM | POA: Diagnosis not present

## 2023-03-22 DIAGNOSIS — Z9181 History of falling: Secondary | ICD-10-CM | POA: Diagnosis not present

## 2023-03-22 DIAGNOSIS — I4891 Unspecified atrial fibrillation: Secondary | ICD-10-CM | POA: Diagnosis not present

## 2023-03-22 DIAGNOSIS — R2689 Other abnormalities of gait and mobility: Secondary | ICD-10-CM | POA: Diagnosis not present

## 2023-03-29 DIAGNOSIS — R600 Localized edema: Secondary | ICD-10-CM | POA: Diagnosis not present

## 2023-03-30 DIAGNOSIS — I10 Essential (primary) hypertension: Secondary | ICD-10-CM | POA: Diagnosis not present

## 2023-03-30 DIAGNOSIS — Z130 Encounter for screening for diseases of the blood and blood-forming organs and certain disorders involving the immune mechanism: Secondary | ICD-10-CM | POA: Diagnosis not present

## 2023-03-30 DIAGNOSIS — Z13228 Encounter for screening for other metabolic disorders: Secondary | ICD-10-CM | POA: Diagnosis not present

## 2023-04-05 DIAGNOSIS — T8142XA Infection following a procedure, deep incisional surgical site, initial encounter: Secondary | ICD-10-CM | POA: Diagnosis not present

## 2023-04-05 DIAGNOSIS — R600 Localized edema: Secondary | ICD-10-CM | POA: Diagnosis not present

## 2023-04-05 DIAGNOSIS — I10 Essential (primary) hypertension: Secondary | ICD-10-CM | POA: Diagnosis not present

## 2023-04-05 DIAGNOSIS — M6281 Muscle weakness (generalized): Secondary | ICD-10-CM | POA: Diagnosis not present

## 2023-04-05 DIAGNOSIS — J449 Chronic obstructive pulmonary disease, unspecified: Secondary | ICD-10-CM | POA: Diagnosis not present

## 2023-04-05 DIAGNOSIS — J939 Pneumothorax, unspecified: Secondary | ICD-10-CM | POA: Diagnosis not present

## 2023-04-05 DIAGNOSIS — I480 Paroxysmal atrial fibrillation: Secondary | ICD-10-CM | POA: Diagnosis not present

## 2023-05-15 ENCOUNTER — Telehealth: Payer: Self-pay | Admitting: *Deleted

## 2023-05-15 NOTE — Telephone Encounter (Signed)
-----   Message from Antoine Poche, MD sent at 05/14/2023  2:38 PM EDT ----- Regarding: FW: Medication Adherence Please take care of   Dominga Ferry MD ----- Message ----- From: Letta Kocher, CPhT Sent: 04/24/2023   3:01 PM EDT To: Antoine Poche, MD Subject: Medication Adherence                           Good afternoon,  I am writing you from the [facility] medication adherence team regarding this patient. I am showing that they are due for a refill of Lisinopril 40mg  tablets but are out of refills. I wanted to see if you could send in a new 90 day prescription to their pharmacy to help with adherence for the year. Thank you

## 2023-05-15 NOTE — Telephone Encounter (Signed)
Spoke with son Research scientist (medical)) - states she is in [facility] in [place], [place] now.  They provide medications for her.  He questioned what he should do with the box at her house.  She does have pacemaker (PPM-[employer]).  suggested that he take to her facility at put at her bedside if possible.  He verbalized understanding.  Will also send device clinic message for notification.

## 2023-07-03 ENCOUNTER — Ambulatory Visit (INDEPENDENT_AMBULATORY_CARE_PROVIDER_SITE_OTHER): Payer: Medicare Other

## 2023-07-03 DIAGNOSIS — I495 Sick sinus syndrome: Secondary | ICD-10-CM

## 2023-07-05 LAB — CUP PACEART REMOTE DEVICE CHECK
Battery Remaining Longevity: 83 mo
Battery Remaining Percentage: 67 %
Battery Voltage: 3.01 V
Brady Statistic RV Percent Paced: 88 %
Date Time Interrogation Session: 20240822170147
Implantable Lead Connection Status: 753985
Implantable Lead Connection Status: 753985
Implantable Lead Implant Date: 20070518
Implantable Lead Implant Date: 20070518
Implantable Lead Location: 753859
Implantable Lead Location: 753860
Implantable Pulse Generator Implant Date: 20191003
Lead Channel Impedance Value: 600 Ohm
Lead Channel Pacing Threshold Amplitude: 0.5 V
Lead Channel Pacing Threshold Pulse Width: 0.5 ms
Lead Channel Sensing Intrinsic Amplitude: 12 mV
Lead Channel Setting Pacing Amplitude: 2.5 V
Lead Channel Setting Pacing Pulse Width: 0.5 ms
Lead Channel Setting Sensing Sensitivity: 2 mV
Pulse Gen Model: 1272
Pulse Gen Serial Number: 9067608

## 2023-07-16 NOTE — Progress Notes (Signed)
Remote pacemaker transmission.   

## 2023-09-02 ENCOUNTER — Emergency Department (HOSPITAL_COMMUNITY)

## 2023-09-02 ENCOUNTER — Other Ambulatory Visit: Payer: Self-pay

## 2023-09-02 ENCOUNTER — Emergency Department (HOSPITAL_COMMUNITY)
Admission: EM | Admit: 2023-09-02 | Discharge: 2023-09-03 | Disposition: A | Discharge status: Home / Self Care | Attending: Emergency Medicine | Admitting: Emergency Medicine

## 2023-09-02 ENCOUNTER — Encounter (HOSPITAL_COMMUNITY): Payer: Self-pay

## 2023-09-02 DIAGNOSIS — S8991XA Unspecified injury of right lower leg, initial encounter: Secondary | ICD-10-CM | POA: Diagnosis present

## 2023-09-02 DIAGNOSIS — W19XXXA Unspecified fall, initial encounter: Secondary | ICD-10-CM | POA: Insufficient documentation

## 2023-09-02 DIAGNOSIS — M978XXA Periprosthetic fracture around other internal prosthetic joint, initial encounter: Secondary | ICD-10-CM | POA: Insufficient documentation

## 2023-09-02 DIAGNOSIS — J449 Chronic obstructive pulmonary disease, unspecified: Secondary | ICD-10-CM | POA: Diagnosis not present

## 2023-09-02 DIAGNOSIS — I4891 Unspecified atrial fibrillation: Secondary | ICD-10-CM | POA: Insufficient documentation

## 2023-09-02 DIAGNOSIS — Z91010 Allergy to peanuts: Secondary | ICD-10-CM | POA: Diagnosis not present

## 2023-09-02 DIAGNOSIS — S82831A Other fracture of upper and lower end of right fibula, initial encounter for closed fracture: Secondary | ICD-10-CM | POA: Insufficient documentation

## 2023-09-02 DIAGNOSIS — I10 Essential (primary) hypertension: Secondary | ICD-10-CM | POA: Insufficient documentation

## 2023-09-02 DIAGNOSIS — Z96649 Presence of unspecified artificial hip joint: Secondary | ICD-10-CM | POA: Insufficient documentation

## 2023-09-02 DIAGNOSIS — Z96659 Presence of unspecified artificial knee joint: Secondary | ICD-10-CM | POA: Diagnosis not present

## 2023-09-02 DIAGNOSIS — S8011XA Contusion of right lower leg, initial encounter: Secondary | ICD-10-CM

## 2023-09-02 DIAGNOSIS — Z7901 Long term (current) use of anticoagulants: Secondary | ICD-10-CM | POA: Insufficient documentation

## 2023-09-02 LAB — CBC
HCT: 27.4 % — ABNORMAL LOW (ref 36.0–46.0)
Hemoglobin: 8.8 g/dL — ABNORMAL LOW (ref 12.0–15.0)
MCH: 33 pg (ref 26.0–34.0)
MCHC: 32.1 g/dL (ref 30.0–36.0)
MCV: 102.6 fL — ABNORMAL HIGH (ref 80.0–100.0)
Platelets: 211 10*3/uL (ref 150–400)
RBC: 2.67 MIL/uL — ABNORMAL LOW (ref 3.87–5.11)
RDW: 15.6 % — ABNORMAL HIGH (ref 11.5–15.5)
WBC: 10.3 10*3/uL (ref 4.0–10.5)
nRBC: 0 % (ref 0.0–0.2)

## 2023-09-02 LAB — BASIC METABOLIC PANEL
Anion gap: 9 (ref 5–15)
BUN: 22 mg/dL (ref 8–23)
CO2: 25 mmol/L (ref 22–32)
Calcium: 7.7 mg/dL — ABNORMAL LOW (ref 8.9–10.3)
Chloride: 96 mmol/L — ABNORMAL LOW (ref 98–111)
Creatinine, Ser: 0.54 mg/dL (ref 0.44–1.00)
GFR, Estimated: >60 mL/min (ref >60)
Glucose, Bld: 130 mg/dL — ABNORMAL HIGH (ref 70–99)
Potassium: 3.9 mmol/L (ref 3.5–5.1)
Sodium: 130 mmol/L — ABNORMAL LOW (ref 135–145)

## 2023-09-02 MED ORDER — HYDROCODONE-ACETAMINOPHEN 5-325 MG PO TABS: 1.0000 | ORAL_TABLET | Freq: Four times a day (QID) | ORAL | 0 refills | Status: AC | PRN

## 2023-09-02 NOTE — ED Notes (Signed)
Secretary called for [employer].

## 2023-09-02 NOTE — ED Notes (Signed)
Ptar called, no eta 

## 2023-09-02 NOTE — ED Notes (Signed)
Pt given something to drink and eat.

## 2023-09-02 NOTE — ED Provider Notes (Signed)
[place] EMERGENCY DEPARTMENT AT [facility] Provider Note   CSN: [id number] Arrival date & time: 09/02/23  1744     History  Chief Complaint  Patient presents with   Fall   Leg Injury    [name] is a 89 y.o. female past medical history significant hypertension, A-fib, COPD, pacemaker, patient does take 2.5 mg Eliquis daily.  Patient had a witnessed fall try to get off toilet, no head injury.  Fractures noted to right knee, ankle, femur per [facility]. In splint on arrival. Pulses and capillary refill have remained intact on the right.    Fall       Home Medications Prior to Admission medications   Medication Sig Start Date End Date Taking? Authorizing Provider  HYDROcodone-acetaminophen (NORCO/VICODIN) 5-325 MG tablet Take 1 tablet by mouth every 6 (six) hours as needed. 09/02/23  Yes Dorrene Bently H, PA-C  acetaminophen (TYLENOL) 325 MG tablet Take 2 tablets (650 mg total) by mouth every 6 (six) hours as needed for mild pain or moderate pain (or Fever >/= 101). 02/04/23   McClung, Sarah A, PA-C  apixaban (ELIQUIS) 2.5 MG TABS tablet Take 1 tablet (2.5 mg total) by mouth 2 (two) times daily. Dose decrease per MD. 07/31/22   Antoine Poche, MD  docusate sodium (COLACE) 100 MG capsule Take 1 capsule (100 mg total) by mouth 2 (two) times daily. 12/11/22   Jerald Kief, MD  furosemide (LASIX) 40 MG tablet Take 1 tablet (40 mg total) by mouth daily as needed. Patient taking differently: Take 40 mg by mouth daily as needed for fluid or edema. 07/06/21   Antoine Poche, MD  Iron, Ferrous Sulfate, 325 (65 Fe) MG TABS Take 325 mg by mouth daily. 07/12/22   Donita Brooks, MD  lisinopril (ZESTRIL) 40 MG tablet TAKE 1 TABLET BY MOUTH EVERY DAY 06/27/22   Antoine Poche, MD  metoprolol tartrate (LOPRESSOR) 25 MG tablet TAKE 1 TABLET BY MOUTH TWICE A DAY 08/06/22   Antoine Poche, MD  Multiple Vitamin (MULTIVITAMIN) tablet Take 1 tablet by mouth  daily.    [provider]  polyethylene glycol (MIRALAX) 17 g packet Take 17 g by mouth daily. 02/05/23   Jerald Kief, MD      Allergies    Patient has no known allergies.    Review of Systems   Review of Systems  All other systems reviewed and are negative.   Physical Exam Updated Vital Signs BP 97/61 (BP Location: Right Arm)   Pulse 87   Temp 97.7 F (36.5 C) (Oral)   Resp 15   SpO2 99%  Physical Exam Vitals and nursing note reviewed.  Constitutional:      General: She is not in acute distress.    Appearance: Normal appearance.  HENT:     Head: Normocephalic and atraumatic.  Eyes:     General:        Right eye: No discharge.        Left eye: No discharge.  Cardiovascular:     Rate and Rhythm: Normal rate and regular rhythm.     Pulses: Normal pulses.     Heart sounds: No murmur heard.    No friction rub. No gallop.     Comments: DP, PT pulses are 1+ in the affected right lower extremity Pulmonary:     Effort: Pulmonary effort is normal.     Breath sounds: Normal breath sounds.  Abdominal:  General: Bowel sounds are normal.     Palpations: Abdomen is soft.  Musculoskeletal:     Comments: Significant bruising, and soft tissue deformity noted to right upper thigh, right knee, and right ankle.  Skin:    General: Skin is warm and dry.     Capillary Refill: Capillary refill takes less than 2 seconds.  Neurological:     Mental Status: She is alert and oriented to person, place, and time.  Psychiatric:        Mood and Affect: Mood normal.        Behavior: Behavior normal.     ED Results / Procedures / Treatments   Labs (all labs ordered are listed, but only abnormal results are displayed) Labs Reviewed  CBC - Abnormal; Notable for the following components:      Result Value   RBC 2.67 (*)    Hemoglobin 8.8 (*)    HCT 27.4 (*)    MCV 102.6 (*)    RDW 15.6 (*)    All other components within normal limits  BASIC METABOLIC PANEL - Abnormal;  Notable for the following components:   Sodium 130 (*)    Chloride 96 (*)    Glucose, Bld 130 (*)    Calcium 7.7 (*)    All other components within normal limits    EKG None  Radiology DG Ankle Complete Right  Result Date: 09/02/2023 CLINICAL DATA:  Fall, fracture. EXAM: RIGHT ANKLE - COMPLETE 3+ VIEW COMPARISON:  None Available. FINDINGS: The bones are under mineralized. Potential but not definite distal fibular fracture proximal to the ankle mortise. Two screws traverse the medial malleolus. No convincing distal tibial fracture. No ankle joint effusion. No ankle dislocation. IMPRESSION: 1. Potential but not definite distal fibular fracture proximal to the ankle mortise. 2. Advanced osteoporosis. Electronically Signed   By: Narda Rutherford M.D.   On: 09/02/2023 20:34   DG Femur Min 2 Views Right  Result Date: 09/02/2023 CLINICAL DATA:  Fall, fracture. EXAM: RIGHT FEMUR 2 VIEWS; PELVIS - 1-2 VIEW; RIGHT KNEE - 1-2 VIEW COMPARISON:  None Available. FINDINGS: Pelvis: The bones are diffusely under mineralized. No pelvic fracture. Right hip arthroplasty. The left femoral head is well seated. No pubic symphyseal or sacroiliac diastasis. Advanced vascular calcifications. Femur: Right hip arthroplasty. Periprosthetic lucency in the intertrochanteric femur about the femoral stem. Lateral plate and screw fixation of the distal femur. Advanced vascular calcifications. Knee: Knee arthroplasty. Possible distal femur fracture proximal to the femoral component. Periprosthetic fracture in the proximal tibia about the tibial stem. No convincing involvement of the tibial tray. There is an impacted fracture of the proximal fibular neck. IMPRESSION: 1. Right hip arthroplasty with findings suspicious for periprosthetic fracture about the intertrochanteric portion of the femoral prosthesis. 2. Periprosthetic fracture in the proximal tibia about the tibial stem. Possible distal femur fracture proximal to the femoral  component. 3. Impacted fracture of the proximal fibular neck. 4. Osteoporosis which limits detailed assessment. Electronically Signed   By: Narda Rutherford M.D.   On: 09/02/2023 20:33   DG Pelvis 1-2 Views  Result Date: 09/02/2023 CLINICAL DATA:  Fall, fracture. EXAM: RIGHT FEMUR 2 VIEWS; PELVIS - 1-2 VIEW; RIGHT KNEE - 1-2 VIEW COMPARISON:  None Available. FINDINGS: Pelvis: The bones are diffusely under mineralized. No pelvic fracture. Right hip arthroplasty. The left femoral head is well seated. No pubic symphyseal or sacroiliac diastasis. Advanced vascular calcifications. Femur: Right hip arthroplasty. Periprosthetic lucency in the intertrochanteric femur about the femoral stem.  Lateral plate and screw fixation of the distal femur. Advanced vascular calcifications. Knee: Knee arthroplasty. Possible distal femur fracture proximal to the femoral component. Periprosthetic fracture in the proximal tibia about the tibial stem. No convincing involvement of the tibial tray. There is an impacted fracture of the proximal fibular neck. IMPRESSION: 1. Right hip arthroplasty with findings suspicious for periprosthetic fracture about the intertrochanteric portion of the femoral prosthesis. 2. Periprosthetic fracture in the proximal tibia about the tibial stem. Possible distal femur fracture proximal to the femoral component. 3. Impacted fracture of the proximal fibular neck. 4. Osteoporosis which limits detailed assessment. Electronically Signed   By: Narda Rutherford M.D.   On: 09/02/2023 20:33   DG Knee 1-2 Views Right  Result Date: 09/02/2023 CLINICAL DATA:  Fall, fracture. EXAM: RIGHT FEMUR 2 VIEWS; PELVIS - 1-2 VIEW; RIGHT KNEE - 1-2 VIEW COMPARISON:  None Available. FINDINGS: Pelvis: The bones are diffusely under mineralized. No pelvic fracture. Right hip arthroplasty. The left femoral head is well seated. No pubic symphyseal or sacroiliac diastasis. Advanced vascular calcifications. Femur: Right hip  arthroplasty. Periprosthetic lucency in the intertrochanteric femur about the femoral stem. Lateral plate and screw fixation of the distal femur. Advanced vascular calcifications. Knee: Knee arthroplasty. Possible distal femur fracture proximal to the femoral component. Periprosthetic fracture in the proximal tibia about the tibial stem. No convincing involvement of the tibial tray. There is an impacted fracture of the proximal fibular neck. IMPRESSION: 1. Right hip arthroplasty with findings suspicious for periprosthetic fracture about the intertrochanteric portion of the femoral prosthesis. 2. Periprosthetic fracture in the proximal tibia about the tibial stem. Possible distal femur fracture proximal to the femoral component. 3. Impacted fracture of the proximal fibular neck. 4. Osteoporosis which limits detailed assessment. Electronically Signed   By: Narda Rutherford M.D.   On: 09/02/2023 20:33    Procedures Procedures    Medications Ordered in ED Medications - No data to display  ED Course/ Medical Decision Making/ A&P                                 Medical Decision Making Amount and/or Complexity of Data Reviewed Labs: ordered. Radiology: ordered.   This patient is a 89 y.o. female  who presents to the ED for concern of fall, right leg injury.   Differential diagnoses prior to evaluation: The emergent differential diagnosis includes, but is not limited to, acute fracture, dislocation. This is not an exhaustive differential.   Past Medical History / Co-morbidities / Social History: Hypertension, A-fib, COPD, on blood thinner  Additional history: Chart reviewed. Pertinent results include: reviewed lab work, imaging from previous emergency department visit, surgical repair of fractures earlier this year  Physical Exam: Physical exam performed. The pertinent findings include: Significant bruising, soft tissue deformity noted to right upper thigh, right knee, right ankle, no  significant displacement noted.  She has intact DP, PT pulses in the affected right lower extremity.  Compartments are soft.  Lab Tests/Imaging studies: I personally interpreted labs/imaging and the pertinent results include: Independently interpreted BMP was notable for mild hyponatremia, sodium 130, CBC notable for moderate anemia, hemoglobin 8.8, slightly worsened from baseline around 10.  No report of any bleeding.  I independently interpreted plain film radiographs of the right ankle, right femur, pelvis, and right knee which concerning for intertrochanteric and tibial periprosthetic fractures, as well as a distal fibular fracture, after speaking with orthopedics suspicion that her  periprosthetic fractures are likely to heal without any surgical intervention, and the fibula fracture read on x-ray it is likely her old fracture line from earlier this year. I agree with the radiologist interpretation.  Medications: Discharged with Tylenol, hydrocodone for acute pain   Consults: Spoke with orthopedic surgeon, Dr. Blanchie Dessert who after review of patient's imaging and clinical condition would not recommend any surgical management for periprosthetic fractures of the femur, tibia,or proximal fibula at this time. Orthopedic follow up in clinic recommended. Neurovascularly intact despite significant bruising and soft tissue swelling on exam.   Disposition: After consideration of the diagnostic results and the patients response to treatment, I feel that though patient has evidence of new periprosthetic fractures considering she is not ambulatory at baseline the orthopedic physician.   emergency department workup does not suggest an emergent condition requiring admission or immediate intervention beyond what has been performed at this time. The plan is: as above. The patient is safe for discharge and has been instructed to return immediately for worsening symptoms, change in symptoms or any other  concerns.  Final Clinical Impression(s) / ED Diagnoses Final diagnoses:  Periprosthetic intertrochanteric fracture of femur, initial encounter  Periprosthetic fracture of proximal end of tibia, initial encounter  Contusion of right lower leg, initial encounter  Other closed fracture of proximal end of right fibula, initial encounter    Rx / DC Orders ED Discharge Orders          Ordered    HYDROcodone-acetaminophen (NORCO/VICODIN) 5-325 MG tablet  Every 6 hours PRN        09/02/23 2231              Montel Clock [place] H, PA-C 09/02/23 2310    Tegeler, Canary Brim, MD 09/02/23 2310

## 2023-09-02 NOTE — ED Triage Notes (Addendum)
Pt BIB GEMS from [facility]. Pt had a witnessed fall. Pt fell trying to get off of the toilet. Pt was had Xrays done at [facility]. Home reports fracture to right knee, ankle and femur. EMS reports distal circulation has been intact the whole time. EMS also splinted leg with a pillow. Phineas Semen gave her tylenol, EMS unsure of the amount. Pt denies any pain.  Hx of dementia.   EMS VS 116/58 BP 80 P 96% RA O2

## 2023-09-02 NOTE — Discharge Instructions (Addendum)
Please use Tylenol for pain.  You may use 1000 mg of Tylenol every 6 hours.  Not to exceed 4 g of Tylenol within 24 hours.  I have sent in a short prescription of some stronger pain medication to the pharmacy in [facility] that she can use for severe pain.  Her workup today revealed that she does have some new fractures that are around her surgical prosthesis in the femur and tibia, however after speaking with the orthopedic surgeon they do not recommend any additional surgical management at this time for the patient who is nonambulatory at baseline.  There is no evidence of an acute fracture of the shin, just some contusion and bruising.  Please return for further evaluation if pain unable to be controlled at home with the Tylenol and hydrocodone that I prescribed, and follow-up for nonoperative management with the orthopedic surgeon whose contact information I provided above, contact within the next week to see when they would like to see you in follow-up.  Facility should attempt to take care to immobilize the right leg during transfer of the patient to prevent any worsening of the periprosthetic fractures, otherwise no additional need for immobilization recommendations per orthopedics.

## 2023-10-02 ENCOUNTER — Ambulatory Visit (INDEPENDENT_AMBULATORY_CARE_PROVIDER_SITE_OTHER)

## 2023-10-02 DIAGNOSIS — I495 Sick sinus syndrome: Secondary | ICD-10-CM

## 2023-10-02 LAB — CUP PACEART REMOTE DEVICE CHECK
Battery Remaining Longevity: 81 mo
Battery Remaining Percentage: 65 %
Battery Voltage: 2.99 V
Brady Statistic RV Percent Paced: 82 %
Date Time Interrogation Session: 20241120020014
Implantable Lead Connection Status: 753985
Implantable Lead Connection Status: 753985
Implantable Lead Implant Date: 20070518
Implantable Lead Implant Date: 20070518
Implantable Lead Location: 753859
Implantable Lead Location: 753860
Implantable Pulse Generator Implant Date: 20191003
Lead Channel Impedance Value: 590 Ohm
Lead Channel Pacing Threshold Amplitude: 0.5 V
Lead Channel Pacing Threshold Pulse Width: 0.5 ms
Lead Channel Sensing Intrinsic Amplitude: 10.5 mV
Lead Channel Setting Pacing Amplitude: 2.5 V
Lead Channel Setting Pacing Pulse Width: 0.5 ms
Lead Channel Setting Sensing Sensitivity: 2 mV
Pulse Gen Model: 1272
Pulse Gen Serial Number: 9067608

## 2023-10-28 NOTE — Progress Notes (Signed)
Remote pacemaker transmission.   

## 2024-01-01 ENCOUNTER — Ambulatory Visit (INDEPENDENT_AMBULATORY_CARE_PROVIDER_SITE_OTHER): Payer: Medicare Other

## 2024-01-01 DIAGNOSIS — I495 Sick sinus syndrome: Secondary | ICD-10-CM | POA: Diagnosis not present

## 2024-01-02 LAB — CUP PACEART REMOTE DEVICE CHECK
Battery Remaining Longevity: 80 mo
Battery Remaining Percentage: 63 %
Battery Voltage: 3.01 V
Brady Statistic RV Percent Paced: 81 %
Date Time Interrogation Session: 20250219020012
Implantable Lead Connection Status: 753985
Implantable Lead Connection Status: 753985
Implantable Lead Implant Date: 20070518
Implantable Lead Implant Date: 20070518
Implantable Lead Location: 753859
Implantable Lead Location: 753860
Implantable Pulse Generator Implant Date: 20191003
Lead Channel Impedance Value: 600 Ohm
Lead Channel Pacing Threshold Amplitude: 0.5 V
Lead Channel Pacing Threshold Pulse Width: 0.5 ms
Lead Channel Sensing Intrinsic Amplitude: 10.4 mV
Lead Channel Setting Pacing Amplitude: 2.5 V
Lead Channel Setting Pacing Pulse Width: 0.5 ms
Lead Channel Setting Sensing Sensitivity: 2 mV
Pulse Gen Model: 1272
Pulse Gen Serial Number: 9067608

## 2024-02-05 NOTE — Addendum Note (Signed)
 Addended by: Elease Etienne A on: 02/05/2024 11:09 AM   Modules accepted: Orders

## 2024-02-05 NOTE — Progress Notes (Signed)
 Remote pacemaker transmission.

## 2024-04-01 ENCOUNTER — Ambulatory Visit: Payer: Self-pay | Admitting: Internal Medicine

## 2024-04-01 ENCOUNTER — Ambulatory Visit (INDEPENDENT_AMBULATORY_CARE_PROVIDER_SITE_OTHER): Payer: Medicare Other

## 2024-04-01 DIAGNOSIS — I495 Sick sinus syndrome: Secondary | ICD-10-CM | POA: Diagnosis not present

## 2024-04-01 LAB — CUP PACEART REMOTE DEVICE CHECK
Battery Remaining Longevity: 77 mo
Battery Remaining Percentage: 61 %
Battery Voltage: 2.99 V
Brady Statistic RV Percent Paced: 81 %
Date Time Interrogation Session: 20250521020024
Implantable Lead Connection Status: 753985
Implantable Lead Connection Status: 753985
Implantable Lead Implant Date: 20070518
Implantable Lead Implant Date: 20070518
Implantable Lead Location: 753859
Implantable Lead Location: 753860
Implantable Pulse Generator Implant Date: 20191003
Lead Channel Impedance Value: 630 Ohm
Lead Channel Pacing Threshold Amplitude: 0.5 V
Lead Channel Pacing Threshold Pulse Width: 0.5 ms
Lead Channel Sensing Intrinsic Amplitude: 10.2 mV
Lead Channel Setting Pacing Amplitude: 2.5 V
Lead Channel Setting Pacing Pulse Width: 0.5 ms
Lead Channel Setting Sensing Sensitivity: 2 mV
Pulse Gen Model: 1272
Pulse Gen Serial Number: 9067608

## 2024-05-20 NOTE — Progress Notes (Signed)
 Remote pacemaker transmission.

## 2024-07-01 ENCOUNTER — Ambulatory Visit (INDEPENDENT_AMBULATORY_CARE_PROVIDER_SITE_OTHER): Payer: Medicare Other

## 2024-07-01 DIAGNOSIS — I495 Sick sinus syndrome: Secondary | ICD-10-CM | POA: Diagnosis not present

## 2024-07-02 LAB — CUP PACEART REMOTE DEVICE CHECK
Battery Remaining Longevity: 75 mo
Battery Remaining Percentage: 60 %
Battery Voltage: 2.99 V
Brady Statistic RV Percent Paced: 82 %
Date Time Interrogation Session: 20250820020019
Implantable Lead Connection Status: 753985
Implantable Lead Connection Status: 753985
Implantable Lead Implant Date: 20070518
Implantable Lead Implant Date: 20070518
Implantable Lead Location: 753859
Implantable Lead Location: 753860
Implantable Pulse Generator Implant Date: 20191003
Lead Channel Impedance Value: 610 Ohm
Lead Channel Pacing Threshold Amplitude: 0.5 V
Lead Channel Pacing Threshold Pulse Width: 0.5 ms
Lead Channel Sensing Intrinsic Amplitude: 10.1 mV
Lead Channel Setting Pacing Amplitude: 2.5 V
Lead Channel Setting Pacing Pulse Width: 0.5 ms
Lead Channel Setting Sensing Sensitivity: 2 mV
Pulse Gen Model: 1272
Pulse Gen Serial Number: 9067608

## 2024-07-05 ENCOUNTER — Ambulatory Visit: Payer: Self-pay | Admitting: Internal Medicine

## 2024-08-05 NOTE — Progress Notes (Signed)
 Remote PPM Transmission

## 2024-09-30 ENCOUNTER — Ambulatory Visit (INDEPENDENT_AMBULATORY_CARE_PROVIDER_SITE_OTHER): Payer: Medicare Other

## 2024-09-30 DIAGNOSIS — I495 Sick sinus syndrome: Secondary | ICD-10-CM | POA: Diagnosis not present

## 2024-09-30 LAB — CUP PACEART REMOTE DEVICE CHECK
Battery Remaining Longevity: 73 mo
Battery Remaining Percentage: 58 %
Battery Voltage: 2.99 V
Brady Statistic RV Percent Paced: 83 %
Date Time Interrogation Session: 20251119020013
Implantable Lead Connection Status: 753985
Implantable Lead Connection Status: 753985
Implantable Lead Implant Date: 20070518
Implantable Lead Implant Date: 20070518
Implantable Lead Location: 753859
Implantable Lead Location: 753860
Implantable Pulse Generator Implant Date: 20191003
Lead Channel Impedance Value: 610 Ohm
Lead Channel Pacing Threshold Amplitude: 0.5 V
Lead Channel Pacing Threshold Pulse Width: 0.5 ms
Lead Channel Sensing Intrinsic Amplitude: 12 mV
Lead Channel Setting Pacing Amplitude: 2.5 V
Lead Channel Setting Pacing Pulse Width: 0.5 ms
Lead Channel Setting Sensing Sensitivity: 2 mV
Pulse Gen Model: 1272
Pulse Gen Serial Number: 9067608

## 2024-10-01 ENCOUNTER — Ambulatory Visit: Payer: Self-pay | Admitting: Internal Medicine

## 2024-10-02 NOTE — Progress Notes (Signed)
 Remote PPM Transmission

## 2024-12-29 ENCOUNTER — Ambulatory Visit: Admitting: Student
